# Patient Record
Sex: Male | Born: 1960 | Race: White | Hispanic: No | Marital: Married | Smoking: Current every day smoker
Health system: Southern US, Community
[De-identification: ages and names within clinical notes are randomized; demographics above are authoritative.]

## PROBLEM LIST (undated history)

## (undated) DIAGNOSIS — I219 Acute myocardial infarction, unspecified: Secondary | ICD-10-CM

## (undated) DIAGNOSIS — I714 Abdominal aortic aneurysm, without rupture, unspecified: Secondary | ICD-10-CM

## (undated) DIAGNOSIS — I10 Essential (primary) hypertension: Secondary | ICD-10-CM

## (undated) DIAGNOSIS — E7800 Pure hypercholesterolemia, unspecified: Secondary | ICD-10-CM

## (undated) DIAGNOSIS — E119 Type 2 diabetes mellitus without complications: Secondary | ICD-10-CM

## (undated) DIAGNOSIS — I2510 Atherosclerotic heart disease of native coronary artery without angina pectoris: Secondary | ICD-10-CM

## (undated) DIAGNOSIS — M797 Fibromyalgia: Secondary | ICD-10-CM

## (undated) HISTORY — DX: Abdominal aortic aneurysm, without rupture, unspecified: I71.40

## (undated) HISTORY — PX: PARTIAL COLECTOMY: SHX5273

## (undated) HISTORY — PX: KNEE SURGERY: SHX244

## (undated) HISTORY — PX: BACK SURGERY: SHX140

## (undated) HISTORY — PX: ABDOMINAL AORTIC ANEURYSM REPAIR: SUR1152

## (undated) HISTORY — PX: CARDIAC CATHETERIZATION: SHX172

---

## 2014-08-05 DIAGNOSIS — F17210 Nicotine dependence, cigarettes, uncomplicated: Secondary | ICD-10-CM | POA: Insufficient documentation

## 2014-08-05 DIAGNOSIS — N132 Hydronephrosis with renal and ureteral calculous obstruction: Secondary | ICD-10-CM | POA: Insufficient documentation

## 2015-02-23 DIAGNOSIS — M65331 Trigger finger, right middle finger: Secondary | ICD-10-CM | POA: Insufficient documentation

## 2016-04-06 DIAGNOSIS — M5416 Radiculopathy, lumbar region: Secondary | ICD-10-CM | POA: Insufficient documentation

## 2016-06-12 DIAGNOSIS — M48061 Spinal stenosis, lumbar region without neurogenic claudication: Secondary | ICD-10-CM | POA: Insufficient documentation

## 2016-06-13 DIAGNOSIS — M5126 Other intervertebral disc displacement, lumbar region: Secondary | ICD-10-CM | POA: Insufficient documentation

## 2017-01-07 DIAGNOSIS — K56600 Partial intestinal obstruction, unspecified as to cause: Secondary | ICD-10-CM | POA: Insufficient documentation

## 2017-05-08 DIAGNOSIS — G4733 Obstructive sleep apnea (adult) (pediatric): Secondary | ICD-10-CM | POA: Insufficient documentation

## 2018-11-14 DIAGNOSIS — E669 Obesity, unspecified: Secondary | ICD-10-CM | POA: Insufficient documentation

## 2018-11-14 DIAGNOSIS — K3184 Gastroparesis: Secondary | ICD-10-CM | POA: Insufficient documentation

## 2018-11-14 DIAGNOSIS — E1129 Type 2 diabetes mellitus with other diabetic kidney complication: Secondary | ICD-10-CM | POA: Insufficient documentation

## 2019-11-13 DIAGNOSIS — N179 Acute kidney failure, unspecified: Secondary | ICD-10-CM | POA: Insufficient documentation

## 2021-05-22 ENCOUNTER — Emergency Department (HOSPITAL_COMMUNITY): Payer: Medicare Other

## 2021-05-22 ENCOUNTER — Emergency Department (HOSPITAL_COMMUNITY)
Admission: EM | Admit: 2021-05-22 | Discharge: 2021-05-22 | Disposition: A | Discharge status: Home / Self Care | Payer: Medicare Other | Attending: Emergency Medicine | Admitting: Emergency Medicine

## 2021-05-22 ENCOUNTER — Other Ambulatory Visit: Payer: Self-pay

## 2021-05-22 ENCOUNTER — Telehealth: Payer: Self-pay | Admitting: Internal Medicine

## 2021-05-22 ENCOUNTER — Encounter (HOSPITAL_COMMUNITY): Payer: Self-pay | Admitting: *Deleted

## 2021-05-22 DIAGNOSIS — I2510 Atherosclerotic heart disease of native coronary artery without angina pectoris: Secondary | ICD-10-CM | POA: Insufficient documentation

## 2021-05-22 DIAGNOSIS — Z7984 Long term (current) use of oral hypoglycemic drugs: Secondary | ICD-10-CM | POA: Diagnosis not present

## 2021-05-22 DIAGNOSIS — R1013 Epigastric pain: Secondary | ICD-10-CM | POA: Diagnosis not present

## 2021-05-22 DIAGNOSIS — Z7982 Long term (current) use of aspirin: Secondary | ICD-10-CM | POA: Diagnosis not present

## 2021-05-22 DIAGNOSIS — F17210 Nicotine dependence, cigarettes, uncomplicated: Secondary | ICD-10-CM | POA: Insufficient documentation

## 2021-05-22 DIAGNOSIS — E119 Type 2 diabetes mellitus without complications: Secondary | ICD-10-CM | POA: Diagnosis not present

## 2021-05-22 HISTORY — DX: Pure hypercholesterolemia, unspecified: E78.00

## 2021-05-22 HISTORY — DX: Acute myocardial infarction, unspecified: I21.9

## 2021-05-22 HISTORY — DX: Type 2 diabetes mellitus without complications: E11.9

## 2021-05-22 HISTORY — DX: Atherosclerotic heart disease of native coronary artery without angina pectoris: I25.10

## 2021-05-22 LAB — TROPONIN I (HIGH SENSITIVITY)
Troponin I (High Sensitivity): 4 ng/L (ref <18)
Troponin I (High Sensitivity): 4 ng/L (ref <18)

## 2021-05-22 LAB — CBG MONITORING, ED
Glucose-Capillary: 58 mg/dL — ABNORMAL LOW (ref 70–99)
Glucose-Capillary: 96 mg/dL (ref 70–99)

## 2021-05-22 LAB — COMPREHENSIVE METABOLIC PANEL
ALT: 22 U/L (ref 0–44)
AST: 15 U/L (ref 15–41)
Albumin: 4.2 g/dL (ref 3.5–5.0)
Alkaline Phosphatase: 40 U/L (ref 38–126)
Anion gap: 7 (ref 5–15)
BUN: 31 mg/dL — ABNORMAL HIGH (ref 6–20)
CO2: 24 mmol/L (ref 22–32)
Calcium: 9.2 mg/dL (ref 8.9–10.3)
Chloride: 104 mmol/L (ref 98–111)
Creatinine, Ser: 1.88 mg/dL — ABNORMAL HIGH (ref 0.61–1.24)
GFR, Estimated: 41 mL/min — ABNORMAL LOW (ref >60)
Glucose, Bld: 107 mg/dL — ABNORMAL HIGH (ref 70–99)
Potassium: 4.7 mmol/L (ref 3.5–5.1)
Sodium: 135 mmol/L (ref 135–145)
Total Bilirubin: 0.6 mg/dL (ref 0.3–1.2)
Total Protein: 7.2 g/dL (ref 6.5–8.1)

## 2021-05-22 LAB — CBC WITH DIFFERENTIAL/PLATELET
Abs Immature Granulocytes: 0.03 10*3/uL (ref 0.00–0.07)
Basophils Absolute: 0.1 10*3/uL (ref 0.0–0.1)
Basophils Relative: 1 %
Eosinophils Absolute: 0.3 10*3/uL (ref 0.0–0.5)
Eosinophils Relative: 3 %
HCT: 46.4 % (ref 39.0–52.0)
Hemoglobin: 15.7 g/dL (ref 13.0–17.0)
Immature Granulocytes: 0 %
Lymphocytes Relative: 20 %
Lymphs Abs: 1.7 10*3/uL (ref 0.7–4.0)
MCH: 31.1 pg (ref 26.0–34.0)
MCHC: 33.8 g/dL (ref 30.0–36.0)
MCV: 91.9 fL (ref 80.0–100.0)
Monocytes Absolute: 0.6 10*3/uL (ref 0.1–1.0)
Monocytes Relative: 8 %
Neutro Abs: 5.5 10*3/uL (ref 1.7–7.7)
Neutrophils Relative %: 68 %
Platelets: 153 10*3/uL (ref 150–400)
RBC: 5.05 MIL/uL (ref 4.22–5.81)
RDW: 13.6 % (ref 11.5–15.5)
WBC: 8.2 10*3/uL (ref 4.0–10.5)
nRBC: 0 % (ref 0.0–0.2)

## 2021-05-22 LAB — LIPASE, BLOOD: Lipase: 30 U/L (ref 11–51)

## 2021-05-22 MED ORDER — ASPIRIN 81 MG PO CHEW
324.0000 mg | CHEWABLE_TABLET | Freq: Once | ORAL | Status: AC
  Administered 2021-05-22: 324 mg via ORAL
  Filled 2021-05-22: qty 4

## 2021-05-22 MED ORDER — PANTOPRAZOLE SODIUM 40 MG IV SOLR
40.0000 mg | Freq: Once | INTRAVENOUS | Status: AC
  Administered 2021-05-22: 40 mg via INTRAVENOUS
  Filled 2021-05-22: qty 40

## 2021-05-22 MED ORDER — SUCRALFATE 1 GM/10ML PO SUSP: 1.0000 g | Freq: Three times a day (TID) | ORAL | 0 refills | Status: DC

## 2021-05-22 MED ORDER — IOHEXOL 350 MG/ML SOLN
75.0000 mL | Freq: Once | INTRAVENOUS | Status: AC | PRN
  Administered 2021-05-22: 75 mL via INTRAVENOUS

## 2021-05-22 MED ORDER — OMEPRAZOLE 20 MG PO CPDR
20.0000 mg | DELAYED_RELEASE_CAPSULE | Freq: Two times a day (BID) | ORAL | 1 refills | Status: DC
Start: 2021-05-22 — End: 2021-05-25

## 2021-05-22 NOTE — Discharge Instructions (Addendum)
Take the Prilosec as directed.  Take the Carafate as directed.  Call Dr. Ave Filter office for follow-up and for upper endoscopy.  Information provided above.  Return for any new or worse symptoms.

## 2021-05-22 NOTE — ED Notes (Signed)
Patient reports he ambulated to the bathroom to urinate and upon return to the bed he became diaphoretic. Finger stick glucose check was 58. Orange juice po 4 ozs given. Patient recovered and reports that he took his diabetes medication today and did not eat. Wife bedside. Treatment team notified.

## 2021-05-22 NOTE — ED Notes (Signed)
Patient transported to CT and returned to room via stretcher by tech.

## 2021-05-22 NOTE — ED Triage Notes (Signed)
Pt c/o chest pain behind the lower sternum going straight to his back x 1 week. Pt also c/o diaphoresis.

## 2021-05-22 NOTE — Telephone Encounter (Signed)
I was called by the ER regards to this patient.  He has an abnormal CT scan concerning for duodenal ulcer versus duodenal diverticulum.  Can we make urgent appointment to see me in clinic either Wednesday or Thursday of this week at 8 AM?  He will need urgent EGD.  Thank you

## 2021-05-22 NOTE — ED Provider Notes (Signed)
[facility] EMERGENCY DEPARTMENT Provider Note   CSN: [id number] Arrival date & time: 05/22/21  1454     History Chief Complaint  Patient presents with   Chest Pain    Clayton Peterson is a 60 y.o. male.  Patient brought in by POV to the emergency department.  Is having epigastric abdominal pain that radiates to the back.  Is been ongoing for 1.5 weeks on and off.  At 1230 today it became constant and has remained there.  Past medical history significant for cardiac stent right side 2012.  An abdominal aortic aneurysm in 2009.  Patient recently moved to the area.  Has not gotten plugged in with cardiology locally.  Past medical history significant coronary artery disease diabetes high cholesterol.  And the abdominal aortic aneurysm repair.      Past Medical History:  Diagnosis Date   Coronary artery disease    Diabetes mellitus without complication ([place])    Heart attack ([place])    High cholesterol     There are no problems to display for this patient.   Past Surgical History:  Procedure Laterality Date   ABDOMINAL AORTIC ANEURYSM REPAIR     BACK SURGERY     x2   CARDIAC CATHETERIZATION     stent placed on right side ofo heart   KNEE SURGERY Left    PARTIAL COLECTOMY         No family history on file.  Social History   Tobacco Use   Smoking status: Every Day    Packs/day: 1.00    Types: Cigarettes    Passive exposure: Never   Smokeless tobacco: Never  Vaping Use   Vaping Use: Never used  Substance Use Topics   Alcohol use: Yes    Comment: very seldom   Drug use: Never    Home Medications Prior to Admission medications   Medication Sig Start Date End Date Taking? Authorizing Provider  acetaminophen (TYLENOL) 325 MG tablet Take 325 mg by mouth every 4 (four) hours as needed for pain. 11/16/19  Yes [provider]  aspirin 81 MG EC tablet Take 81 mg by mouth daily.   Yes [provider]  fenofibrate (TRICOR) 145 MG tablet Take 145 mg by  mouth daily. 03/18/21  Yes [provider]  gabapentin (NEURONTIN) 100 MG capsule Take 100 mg by mouth daily. 05/05/21  Yes [provider]  glimepiride (AMARYL) 4 MG tablet Take 4 mg by mouth 2 (two) times daily. 03/29/21  Yes [provider]  lisinopril (ZESTRIL) 20 MG tablet Take 20 mg by mouth daily. 03/29/21  Yes [provider]  meloxicam (MOBIC) 15 MG tablet Take 15 mg by mouth daily. 03/29/21  Yes [provider]  metFORMIN (GLUCOPHAGE) 1000 MG tablet Take 1,000 mg by mouth 2 (two) times daily. 03/18/21  Yes [provider]  metoprolol tartrate (LOPRESSOR) 50 MG tablet Take 50 mg by mouth 2 (two) times daily. 03/18/21  Yes [provider]  omeprazole (PRILOSEC) 20 MG capsule Take 1 capsule (20 mg total) by mouth 2 (two) times daily before a meal. 05/22/21  Yes Fredia Sorrow, MD  rosuvastatin (CRESTOR) 40 MG tablet Take 40 mg by mouth at bedtime. 03/29/21  Yes [provider]  sucralfate (CARAFATE) 1 GM/10ML suspension Take 10 mLs (1 g total) by mouth 4 (four) times daily -  with meals and at bedtime. 05/22/21  Yes Fredia Sorrow, MD    Allergies    Bee venom and Coconut oil  Review of Systems   Review of Systems  Constitutional:  Negative for chills and fever.  HENT:  Negative for ear pain and sore throat.   Eyes:  Negative for pain and visual disturbance.  Respiratory:  Negative for cough and shortness of breath.   Cardiovascular:  Positive for chest pain. Negative for palpitations.  Gastrointestinal:  Positive for abdominal distention. Negative for abdominal pain and vomiting.  Genitourinary:  Negative for dysuria and hematuria.  Musculoskeletal:  Positive for back pain. Negative for arthralgias.  Skin:  Negative for color change and rash.  Neurological:  Negative for seizures and syncope.  All other systems reviewed and are negative.  Physical Exam Updated Vital Signs BP 124/74 (BP Location: Left Arm)   Pulse  (!) 53   Temp 97.6 F (36.4 C) (Oral)   Resp 13   Ht 1.753 m ('5\' 9"'$ )   Wt 96.6 kg   SpO2 96%   BMI 31.45 kg/m   Physical Exam Vitals and nursing note reviewed.  Constitutional:      General: He is not in acute distress.    Appearance: Normal appearance. He is well-developed.  HENT:     Head: Normocephalic and atraumatic.  Eyes:     Extraocular Movements: Extraocular movements intact.     Conjunctiva/sclera: Conjunctivae normal.     Pupils: Pupils are equal, round, and reactive to light.  Cardiovascular:     Rate and Rhythm: Normal rate and regular rhythm.     Heart sounds: No murmur heard. Pulmonary:     Effort: Pulmonary effort is normal. No respiratory distress.     Breath sounds: Normal breath sounds.  Chest:     Chest wall: No tenderness.  Abdominal:     Palpations: Abdomen is soft.     Tenderness: There is abdominal tenderness.     Comments: Tenderness epigastric area without any guarding.  Musculoskeletal:        General: No swelling.     Cervical back: Normal range of motion and neck supple.  Skin:    General: Skin is warm and dry.     Capillary Refill: Capillary refill takes less than 2 seconds.  Neurological:     General: No focal deficit present.     Mental Status: He is alert and oriented to person, place, and time.    ED Results / Procedures / Treatments   Labs (all labs ordered are listed, but only abnormal results are displayed) Labs Reviewed  COMPREHENSIVE METABOLIC PANEL - Abnormal; Notable for the following components:      Result Value   Glucose, Bld 107 (*)    BUN 31 (*)    Creatinine, Ser 1.88 (*)    GFR, Estimated 41 (*)    All other components within normal limits  CBG MONITORING, ED - Abnormal; Notable for the following components:   Glucose-Capillary 58 (*)    All other components within normal limits  CBC WITH DIFFERENTIAL/PLATELET  LIPASE, BLOOD  CBG MONITORING, ED  TROPONIN I (HIGH SENSITIVITY)  TROPONIN I (HIGH SENSITIVITY)     EKG EKG Interpretation  Date/Time:  Sunday May 22 2021 15:07:56 EDT Ventricular Rate:  62 PR Interval:  151 QRS Duration: 99 QT Interval:  397 QTC Calculation: 404 R Axis:   93 Text Interpretation: Sinus rhythm Borderline right axis deviation Abnormal inferior Q waves Inferior changes No previous ECGs available Confirmed by Fredia Sorrow [phone]) on 05/22/2021 3:32:49 PM  Radiology DG Chest Port 1 View  Result Date: 05/22/2021 CLINICAL  DATA:  Chest and epigastric pain EXAM: PORTABLE CHEST 1 VIEW COMPARISON:  None. FINDINGS: Heart and mediastinal contours are within normal limits. No focal opacities or effusions. No acute bony abnormality. IMPRESSION: No active disease. Electronically Signed   By: Rolm Baptise M.D.   On: 05/22/2021 16:07   CT Angio Chest/Abd/Pel for Dissection W and/or W/WO  Result Date: 05/22/2021 CLINICAL DATA:  Thoracic aortic aneurysm (TAA) suspected Abdominal pain, aortic dissection suspected Chest and epigastric pain. Electronic medical records states history of aortic aneurysm repair. EXAM: CT ANGIOGRAPHY CHEST, ABDOMEN AND PELVIS TECHNIQUE: Non-contrast CT of the chest was initially obtained. Multidetector CT imaging through the chest, abdomen and pelvis was performed using the standard protocol during bolus administration of intravenous contrast. Multiplanar reconstructed images and MIPs were obtained and reviewed to evaluate the vascular anatomy. CONTRAST:  18m OMNIPAQUE IOHEXOL 350 MG/ML SOLN COMPARISON:  Chest radiograph earlier today. FINDINGS: CTA CHEST FINDINGS Cardiovascular: The thoracic aorta is normal in caliber. No aneurysm. No aortic hematoma noncontrast exam. There is moderate atherosclerosis with both calcified and irregular noncalcified plaque but no dissection, evidence of acute aortic syndrome, or evidence of vasculitis. Common origin of the brachiocephalic and left common carotid artery. Aortic branch vessels are patent. The heart is normal in  size. There are coronary artery calcifications. No pericardial effusion. Cannot assess for pulmonary embolus given phase of contrast tailored for aortic evaluation. Mediastinum/Nodes: Shotty mediastinal lymph nodes, largest subcarinal measuring 10 mm short axis. No enlarged mediastinal or hilar lymph nodes by size criteria. No axillary adenopathy. No thyroid nodule. Decompressed esophagus. Lungs/Pleura: Mild heterogeneous pulmonary parenchyma. Punctate calcified granuloma in the left upper lobe. No pneumonia or confluent airspace disease. There is moderate central bronchial thickening. No pleural fluid. No findings of pulmonary edema. No pulmonary mass. The trachea and central bronchi are patent. Musculoskeletal: There are no acute or suspicious osseous abnormalities. Review of the MIP images confirms the above findings. CTA ABDOMEN AND PELVIS FINDINGS VASCULAR Aorta: Abdominal aortic aneurysm. The aorta at the level of the left renal artery measures 3.2 cm. Aorta at the level of the left renal vein measures 3.9 cm. The infrarenal aorta measures 4.5 cm maximal dimension. Aorto bi-iliac stent graft in place, origin ating at the level of the left renal artery. There is eccentric mural thrombus in the abdominal aorta above the level of the graft, without hemodynamically significant stenosis of the aortic lumen. Mural thrombus extends to the proximal aspect of the graft causing loss and 50% luminal stenosis of the abdominal aorta. The graft is patent. No obvious endoleak on this arterial phase exam. No evidence of vasculitis or periaortic inflammation. Celiac: Patent without evidence of aneurysm, dissection, vasculitis or significant stenosis. Splenic artery is tortuous. SMA: Patent without evidence of aneurysm, dissection, vasculitis or significant stenosis. Renals: The left renal artery is patent. There are 2 right renal arteries. Accessory renal artery supplies the right upper pole which is patent. 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 there is minimal distal reconstitution with thready flow distally. IMA: Occluded at the origin with reconstitution. 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 Slight aneurysmal dilatation of the proximal left internal iliac artery at 11 mm. Right external iliac arteries patent with moderate atherosclerosis. There is irregular mixed plaque in the right common femoral artery, with distal branches patent. The left external iliac artery is patent. There is short segment occlusion of the left internal iliac artery spanning approximately 1 cm, series 4,  image 180. Distal vessel is diminutive. Veins: The portal and splenic veins are patent. Suprarenal IVC is patent. No portal venous or mesenteric gas. Review of the MIP images confirms the above findings. NON-VASCULAR Hepatobiliary: Suspected hepatic steatosis, not well assessed on this arterial phase exam. No visualized focal lesion. Gallbladder physiologically distended, no calcified stone. No biliary dilatation. Pancreas: No ductal dilatation or inflammation. Spleen: Normal in size and arterial enhancement. Adrenals/Urinary Tract: No adrenal nodule. There is diminished perfusion to the majority of the right kidney with normal perfusion only seen to the upper pole. Right renal atrophy and cortical thinning. No hydronephrosis. There is cortical scarring in the upper left kidney with at least 2 nonobstructing intrarenal calculi. No left hydronephrosis. Unremarkable urinary bladder. Stomach/Bowel: Decompressed stomach. There is an air-filled outpouching with adjacent fat stranding arising from the first portion of the duodenum, series 4, image 113. It is unclear if this represents a duodenal ulcer or duodenal diverticulum. Small bowel is otherwise unremarkable. No small bowel obstruction. The appendix is not seen. Moderate colonic stool burden without colonic wall thickening. No colonic inflammation.  Lymphatic: No abdominopelvic adenopathy. Reproductive: Prostatic calcifications. Other: No ascites or focal fluid collection. Musculoskeletal: There are no acute or suspicious osseous abnormalities. Review of the MIP images confirms the above findings. IMPRESSION: Vascular: 1. Abdominal aortic aneurysm repair with residual aneurysm sac 4.5 cm. There is mural thrombus in the aorta and both above and within the upper aspect of the graft, causing less than 50% luminal stenosis. 2. 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 Short segment occlusion of the left internal iliac artery with distal reconstitution 3. 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 There is diffuse right renal parenchymal thinning suggesting this may be chronic. Small accessory upper pole artery perfuses the far upper pole. 4. Normal caliber thoracic aorta with diffuse atherosclerosis. Nonvascular: 1. Air-filled outpouching with adjacent fat stranding arising from the first portion of the duodenum. It is unclear if this represents a duodenal ulcer or duodenal diverticulum. There is adjacent fat stranding suggesting inflammation. Endoscopy could be considered for further evaluation. 2. Nonobstructing left renal calculi. 3. Suspected hepatic steatosis. 4. Moderate central bronchial thickening. No acute intrathoracic abnormality. Aortic Atherosclerosis (ICD10-I70.0). These results were called by telephone at the time of interpretation on 05/22/2021 at 8:11 pm to provider Fredia Sorrow , who verbally acknowledged these results. Electronically Signed   By: Keith Rake M.D.   On: 05/22/2021 20:12    Procedures Procedures   Medications Ordered in ED Medications  aspirin chewable tablet 324 mg (324 mg Oral Given 05/22/21 1551)  iohexol (OMNIPAQUE) 350 MG/ML injection 75 mL (75 mLs Intravenous Contrast Given 05/22/21 1914)  pantoprazole (PROTONIX)  injection 40 mg (40 mg Intravenous Given 05/22/21 2148)    ED Course  I have reviewed the triage vital signs and the nursing notes.  Pertinent labs & imaging results that were available during my care of the patient were reviewed by me and considered in my medical decision making (see chart for details).    MDM Rules/Calculators/A&P                           Patient's pain more epigastric area raising some concern about the aortic aneurysm.  But also has a history of coronary artery disease.  Troponins x2 4 without any significant changes.  No leukocytosis.  Hemoglobin is 15.7.  Patient BUN and creatinine is elevated 31 1.88 given a GFR 41.  No significant electrolyte abnormalities no liver function test abnormalities.  Lipase was normal.  Essentially acute cardiac event ruled out EKG without any significant changes.  Chest x-ray without any acute findings.  Based on his past history of the aneurysm patient underwent CT angio chest and abdomen.  Findings showed an abdominal aortic aneurysm repair with a residual aneurysm sac of 4.5.  I this is causing less than 50% luminal stenosis.  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  The main concern as called to me by radiology was an air-filled outpouching with adjacent fat stranding arising from the first portion of the duodenum.  It is unclear if it represents a duodenal ulcer.  Or diverticulum.  This certainly duodenal ulcer could explain patient's pain pattern.  Discussed with Dr. Abbey Chatters from gastroenterology.  He recommended placing the patient on Prilosec as well as Carafate he will follow him up in the office in the next few days and arrange for an upper endoscopy.  Patient's primary care doctor is the [facility] he can follow-up regarding the renal function with them. Final Clinical Impression(s) / ED Diagnoses Final diagnoses:  Epigastric  abdominal pain    Rx / DC Orders ED Discharge Orders          Ordered    omeprazole (PRILOSEC) 20 MG capsule  2 times daily before meals        05/22/21 2148    sucralfate (CARAFATE) 1 GM/10ML suspension  3 times daily with meals & bedtime        05/22/21 2148             Fredia Sorrow, MD 05/24/21 [phone]

## 2021-05-22 NOTE — ED Notes (Addendum)
Patient complained of upper abdominal pain 7 out of 10. Patient states that he is sweating and feels hot. Took patient's temp 98.1. Asked patient if he was diabetic, patient states yes. CBG checked 58. RN Marliss Coots made aware.

## 2021-05-23 NOTE — Telephone Encounter (Signed)
Patient's wife is aware of OV with Dr Abbey Chatters on 8/3 at 0800 with Dr Abbey Chatters

## 2021-05-25 ENCOUNTER — Encounter: Payer: Self-pay | Admitting: Internal Medicine

## 2021-05-25 ENCOUNTER — Telehealth: Payer: Self-pay | Admitting: *Deleted

## 2021-05-25 ENCOUNTER — Ambulatory Visit (INDEPENDENT_AMBULATORY_CARE_PROVIDER_SITE_OTHER): Payer: Medicare Other | Admitting: Internal Medicine

## 2021-05-25 ENCOUNTER — Other Ambulatory Visit: Payer: Self-pay

## 2021-05-25 ENCOUNTER — Other Ambulatory Visit: Payer: Self-pay | Admitting: Gastroenterology

## 2021-05-25 VITALS — BP 100/61 | HR 47 | Temp 97.0°F | Ht 69.0 in | Wt 219.4 lb

## 2021-05-25 DIAGNOSIS — R933 Abnormal findings on diagnostic imaging of other parts of digestive tract: Secondary | ICD-10-CM

## 2021-05-25 DIAGNOSIS — R1013 Epigastric pain: Secondary | ICD-10-CM | POA: Diagnosis not present

## 2021-05-25 DIAGNOSIS — Z1211 Encounter for screening for malignant neoplasm of colon: Secondary | ICD-10-CM

## 2021-05-25 MED ORDER — OMEPRAZOLE 20 MG PO CPDR: 20.0000 mg | DELAYED_RELEASE_CAPSULE | Freq: Two times a day (BID) | ORAL | 5 refills | Status: DC

## 2021-05-25 NOTE — Progress Notes (Signed)
Primary Care Physician:  Alanson Puls, [facility] Primary Gastroenterologist:  Dr. Abbey Chatters  Chief Complaint  Patient presents with   Abdominal Pain    Abd pain has improved some. Recently in ED Sunday.     HPI:   Clayton Peterson is a 60 y.o. male who presents to the clinic today for ER follow-up visit.  Seen in the ER 3 days ago for new onset epigastric pain.  Patient states this started approximately 2 weeks ago but progressively worsened.  Pain moderate to severe, does not radiate.  Does have a history of aortic repair.  Underwent CT chest abdomen pelvis in the ER which showed findings of possible duodenal ulcer versus diverticulum.  Patient does take Mobic every day for the last 5 years.  Also takes ASA 81 mg twice daily, recently decreased to once daily.  No melena hematochezia.  No change in his bowel movements.  I was contacted by the ER and recommended patient being started on omeprazole 20 mg twice daily as well as Carafate 4 times a day.  He states since starting this his symptoms have improved.  Never had an EGD or colonoscopy.  Has never undergone any colon cancer screening modality.  No family history of colorectal malignancy.  No unintentional weight loss.  Past Medical History:  Diagnosis Date   Coronary artery disease    Diabetes mellitus without complication ([place])    Heart attack ([place])    High cholesterol     Past Surgical History:  Procedure Laterality Date   ABDOMINAL AORTIC ANEURYSM REPAIR     BACK SURGERY     x2   CARDIAC CATHETERIZATION     stent placed on right side ofo heart   KNEE SURGERY Left    PARTIAL COLECTOMY      Current Outpatient Medications  Medication Sig Dispense Refill   acetaminophen (TYLENOL) 325 MG tablet Take 325 mg by mouth every 4 (four) hours as needed for pain.     aspirin 81 MG EC tablet Take 81 mg by mouth daily.     Cholecalciferol (VITAMIN D3) 125 MCG (5000 UT) TABS Take by mouth daily.     fenofibrate (TRICOR) 145 MG tablet  Take 145 mg by mouth daily.     gabapentin (NEURONTIN) 100 MG capsule Take 100 mg by mouth daily.     glimepiride (AMARYL) 4 MG tablet Take 4 mg by mouth 2 (two) times daily.     lisinopril (ZESTRIL) 20 MG tablet Take 20 mg by mouth daily.     meloxicam (MOBIC) 15 MG tablet Take 15 mg by mouth daily.     metFORMIN (GLUCOPHAGE) 1000 MG tablet Take 1,000 mg by mouth 2 (two) times daily.     metoprolol tartrate (LOPRESSOR) 50 MG tablet Take 50 mg by mouth 2 (two) times daily.     omeprazole (PRILOSEC) 20 MG capsule Take 1 capsule (20 mg total) by mouth 2 (two) times daily before a meal. 30 capsule 1   rosuvastatin (CRESTOR) 40 MG tablet Take 40 mg by mouth at bedtime.     sucralfate (CARAFATE) 1 GM/10ML suspension Take 10 mLs (1 g total) by mouth 4 (four) times daily -  with meals and at bedtime. 420 mL 0   traMADol (ULTRAM) 50 MG tablet Take 50 mg by mouth every 6 (six) hours as needed.     No current facility-administered medications for this visit.    Allergies as of 05/25/2021 - Review Complete 05/25/2021  Allergen Reaction  Noted   Bee venom Anaphylaxis 05/22/2021   Coconut oil Anaphylaxis 05/22/2021    No family history on file.  Social History   Socioeconomic History   Marital status: Married    Spouse name: Not on file   Number of children: Not on file   Years of education: Not on file   Highest education level: Not on file  Occupational History   Not on file  Tobacco Use   Smoking status: Every Day    Packs/day: 1.00    Types: Cigarettes    Passive exposure: Never   Smokeless tobacco: Never  Vaping Use   Vaping Use: Never used  Substance and Sexual Activity   Alcohol use: Yes    Comment: very seldom   Drug use: Never   Sexual activity: Not on file  Other Topics Concern   Not on file  Social History Narrative   Not on file   Social Determinants of Health   Financial Resource Strain: Not on file  Food Insecurity: Not on file  Transportation Needs: Not on file   Physical Activity: Not on file  Stress: Not on file  Social Connections: Not on file  Intimate Partner Violence: Not on file    Subjective: Review of Systems  Constitutional:  Negative for chills and fever.  HENT:  Negative for congestion and hearing loss.   Eyes:  Negative for blurred vision and double vision.  Respiratory:  Negative for cough and shortness of breath.   Cardiovascular:  Negative for chest pain and palpitations.  Gastrointestinal:  Positive for abdominal pain and nausea. Negative for blood in stool, constipation, diarrhea, heartburn, melena and vomiting.  Genitourinary:  Negative for dysuria and urgency.  Musculoskeletal:  Negative for joint pain and myalgias.  Skin:  Negative for itching and rash.  Neurological:  Negative for dizziness and headaches.  Psychiatric/Behavioral:  Negative for depression. The patient is not nervous/anxious.       Objective: BP 100/61   Pulse (!) 47   Temp (!) 97 F (36.1 C)   Ht '5\' 9"'$  (1.753 m)   Wt 219 lb 6.4 oz (99.5 kg)   BMI 32.40 kg/m  Physical Exam Constitutional:      Appearance: Normal appearance.  HENT:     Head: Normocephalic and atraumatic.  Eyes:     Extraocular Movements: Extraocular movements intact.     Conjunctiva/sclera: Conjunctivae normal.  Cardiovascular:     Rate and Rhythm: Normal rate and regular rhythm.  Pulmonary:     Effort: Pulmonary effort is normal.     Breath sounds: Normal breath sounds.  Abdominal:     General: Bowel sounds are normal.     Palpations: Abdomen is soft.  Musculoskeletal:        General: Normal range of motion.     Cervical back: Normal range of motion and neck supple.  Skin:    General: Skin is warm.  Neurological:     General: No focal deficit present.     Mental Status: He is alert and oriented to person, place, and time.  Psychiatric:        Mood and Affect: Mood normal.        Behavior: Behavior normal.     Assessment: *Epigastric pain *Abnormal CT of the  small bowel *Colon cancer screening  Plan: Discussed patient's CT in depth with him and his wife today. Will schedule for EGD to evaluate for peptic ulcer disease, esophagitis, gastritis, H. Pylori, duodenitis, or other. Will also  evaluate for esophageal stricture, Schatzki's ring, esophageal web or other.   At the same time we will perform colonoscopy for colon cancer screening purposes.  The risks including infection, bleed, or perforation as well as benefits, limitations, alternatives and imponderables have been reviewed with the patient. Potential for esophageal dilation, biopsy, etc. have also been reviewed.  Questions have been answered. All parties agreeable.  Continue on omeprazole 20 mg twice daily.  Refill sent today.  Continue Carafate 4 times a day as needed.  Further recommendations to follow.   05/25/2021 8:29 AM   Disclaimer: This note was dictated with voice recognition software. Similar sounding words can inadvertently be transcribed and may not be corrected upon review.

## 2021-05-25 NOTE — H&P (View-Only) (Signed)
Primary Care Physician:  Alanson Puls, [facility] Primary Gastroenterologist:  Dr. Abbey Chatters  Chief Complaint  Patient presents with   Abdominal Pain    Abd pain has improved some. Recently in ED Sunday.     HPI:   Clayton Peterson is a 60 y.o. male who presents to the clinic today for ER follow-up visit.  Seen in the ER 3 days ago for new onset epigastric pain.  Patient states this started approximately 2 weeks ago but progressively worsened.  Pain moderate to severe, does not radiate.  Does have a history of aortic repair.  Underwent CT chest abdomen pelvis in the ER which showed findings of possible duodenal ulcer versus diverticulum.  Patient does take Mobic every day for the last 5 years.  Also takes ASA 81 mg twice daily, recently decreased to once daily.  No melena hematochezia.  No change in his bowel movements.  I was contacted by the ER and recommended patient being started on omeprazole 20 mg twice daily as well as Carafate 4 times a day.  He states since starting this his symptoms have improved.  Never had an EGD or colonoscopy.  Has never undergone any colon cancer screening modality.  No family history of colorectal malignancy.  No unintentional weight loss.  Past Medical History:  Diagnosis Date   Coronary artery disease    Diabetes mellitus without complication ([place])    Heart attack ([place])    High cholesterol     Past Surgical History:  Procedure Laterality Date   ABDOMINAL AORTIC ANEURYSM REPAIR     BACK SURGERY     x2   CARDIAC CATHETERIZATION     stent placed on right side ofo heart   KNEE SURGERY Left    PARTIAL COLECTOMY      Current Outpatient Medications  Medication Sig Dispense Refill   acetaminophen (TYLENOL) 325 MG tablet Take 325 mg by mouth every 4 (four) hours as needed for pain.     aspirin 81 MG EC tablet Take 81 mg by mouth daily.     Cholecalciferol (VITAMIN D3) 125 MCG (5000 UT) TABS Take by mouth daily.     fenofibrate (TRICOR) 145 MG tablet  Take 145 mg by mouth daily.     gabapentin (NEURONTIN) 100 MG capsule Take 100 mg by mouth daily.     glimepiride (AMARYL) 4 MG tablet Take 4 mg by mouth 2 (two) times daily.     lisinopril (ZESTRIL) 20 MG tablet Take 20 mg by mouth daily.     meloxicam (MOBIC) 15 MG tablet Take 15 mg by mouth daily.     metFORMIN (GLUCOPHAGE) 1000 MG tablet Take 1,000 mg by mouth 2 (two) times daily.     metoprolol tartrate (LOPRESSOR) 50 MG tablet Take 50 mg by mouth 2 (two) times daily.     omeprazole (PRILOSEC) 20 MG capsule Take 1 capsule (20 mg total) by mouth 2 (two) times daily before a meal. 30 capsule 1   rosuvastatin (CRESTOR) 40 MG tablet Take 40 mg by mouth at bedtime.     sucralfate (CARAFATE) 1 GM/10ML suspension Take 10 mLs (1 g total) by mouth 4 (four) times daily -  with meals and at bedtime. 420 mL 0   traMADol (ULTRAM) 50 MG tablet Take 50 mg by mouth every 6 (six) hours as needed.     No current facility-administered medications for this visit.    Allergies as of 05/25/2021 - Review Complete 05/25/2021  Allergen Reaction  Noted   Bee venom Anaphylaxis 05/22/2021   Coconut oil Anaphylaxis 05/22/2021    No family history on file.  Social History   Socioeconomic History   Marital status: Married    Spouse name: Not on file   Number of children: Not on file   Years of education: Not on file   Highest education level: Not on file  Occupational History   Not on file  Tobacco Use   Smoking status: Every Day    Packs/day: 1.00    Types: Cigarettes    Passive exposure: Never   Smokeless tobacco: Never  Vaping Use   Vaping Use: Never used  Substance and Sexual Activity   Alcohol use: Yes    Comment: very seldom   Drug use: Never   Sexual activity: Not on file  Other Topics Concern   Not on file  Social History Narrative   Not on file   Social Determinants of Health   Financial Resource Strain: Not on file  Food Insecurity: Not on file  Transportation Needs: Not on file   Physical Activity: Not on file  Stress: Not on file  Social Connections: Not on file  Intimate Partner Violence: Not on file    Subjective: Review of Systems  Constitutional:  Negative for chills and fever.  HENT:  Negative for congestion and hearing loss.   Eyes:  Negative for blurred vision and double vision.  Respiratory:  Negative for cough and shortness of breath.   Cardiovascular:  Negative for chest pain and palpitations.  Gastrointestinal:  Positive for abdominal pain and nausea. Negative for blood in stool, constipation, diarrhea, heartburn, melena and vomiting.  Genitourinary:  Negative for dysuria and urgency.  Musculoskeletal:  Negative for joint pain and myalgias.  Skin:  Negative for itching and rash.  Neurological:  Negative for dizziness and headaches.  Psychiatric/Behavioral:  Negative for depression. The patient is not nervous/anxious.       Objective: BP 100/61   Pulse (!) 47   Temp (!) 97 F (36.1 C)   Ht '5\' 9"'$  (1.753 m)   Wt 219 lb 6.4 oz (99.5 kg)   BMI 32.40 kg/m  Physical Exam Constitutional:      Appearance: Normal appearance.  HENT:     Head: Normocephalic and atraumatic.  Eyes:     Extraocular Movements: Extraocular movements intact.     Conjunctiva/sclera: Conjunctivae normal.  Cardiovascular:     Rate and Rhythm: Normal rate and regular rhythm.  Pulmonary:     Effort: Pulmonary effort is normal.     Breath sounds: Normal breath sounds.  Abdominal:     General: Bowel sounds are normal.     Palpations: Abdomen is soft.  Musculoskeletal:        General: Normal range of motion.     Cervical back: Normal range of motion and neck supple.  Skin:    General: Skin is warm.  Neurological:     General: No focal deficit present.     Mental Status: He is alert and oriented to person, place, and time.  Psychiatric:        Mood and Affect: Mood normal.        Behavior: Behavior normal.     Assessment: *Epigastric pain *Abnormal CT of the  small bowel *Colon cancer screening  Plan: Discussed patient's CT in depth with him and his wife today. Will schedule for EGD to evaluate for peptic ulcer disease, esophagitis, gastritis, H. Pylori, duodenitis, or other. Will also  evaluate for esophageal stricture, Schatzki's ring, esophageal web or other.   At the same time we will perform colonoscopy for colon cancer screening purposes.  The risks including infection, bleed, or perforation as well as benefits, limitations, alternatives and imponderables have been reviewed with the patient. Potential for esophageal dilation, biopsy, etc. have also been reviewed.  Questions have been answered. All parties agreeable.  Continue on omeprazole 20 mg twice daily.  Refill sent today.  Continue Carafate 4 times a day as needed.  Further recommendations to follow.   05/25/2021 8:29 AM   Disclaimer: This note was dictated with voice recognition software. Similar sounding words can inadvertently be transcribed and may not be corrected upon review.

## 2021-05-25 NOTE — Patient Instructions (Signed)
We will schedule you for upper endoscopy to further evaluate your epigastric pain and abnormal CT scan.  At the same time we will perform colonoscopy for screening purposes.  Continue on omeprazole twice daily.  Continue Carafate 4 times daily until bottle is empty.  Further recommendations to follow.  It was nice meeting both of you today   Dr. Loletha Grayer  At [facility] we value your feedback. You may receive a survey about your visit today. Please share your experience as we strive to create trusting relationships with our patients to provide genuine, compassionate, quality care.  We appreciate your understanding and patience as we review any laboratory studies, imaging, and other diagnostic tests that are ordered as we care for you. Our office policy is 5 business days for review of these results, and any emergent or urgent results are addressed in a timely manner for your best interest. If you do not hear from our office in 1 week, please contact us.   We also encourage the use of MyChart, which contains your medical information for your review as well. If you are not enrolled in this feature, an access code is on this after visit summary for your convenience. Thank you for allowing [place] to be involved in your care.  It was great to see you today!  I hope you have a great rest of your summer!!    Elon Alas. Abbey Chatters, D.O. Gastroenterology and Hepatology [facility]

## 2021-05-25 NOTE — Telephone Encounter (Signed)
Called pt home # x 3 line busy, called mobile [facility] to call back to schedule TCS/EGD propofol ASA 3 with Dr. Abbey Chatters

## 2021-05-26 ENCOUNTER — Encounter: Payer: Self-pay | Admitting: *Deleted

## 2021-05-26 MED ORDER — PEG 3350-KCL-NA BICARB-NACL 420 G PO SOLR: ORAL | 0 refills | Status: DC

## 2021-05-26 NOTE — Telephone Encounter (Signed)
Spoke with pt spouse. He has been scheduled for 8/12 at 10:00am. Aware will need pre-op appt. They will come by office and pick up instructions with pre-op appt. Confirmed pharmacy

## 2021-05-30 ENCOUNTER — Encounter (HOSPITAL_COMMUNITY): Payer: Self-pay

## 2021-05-31 ENCOUNTER — Other Ambulatory Visit: Payer: Self-pay

## 2021-05-31 ENCOUNTER — Encounter (HOSPITAL_COMMUNITY): Payer: Self-pay

## 2021-05-31 ENCOUNTER — Encounter (HOSPITAL_COMMUNITY)
Admission: RE | Admit: 2021-05-31 | Discharge: 2021-05-31 | Disposition: A | Discharge status: Home / Self Care | Payer: Medicare Other | Source: Ambulatory Visit | Attending: Internal Medicine | Admitting: Internal Medicine

## 2021-05-31 HISTORY — DX: Essential (primary) hypertension: I10

## 2021-05-31 NOTE — Patient Instructions (Addendum)
Clayton Peterson  05/31/2021     '@PREFPERIOPPHARMACY'$ @   Your procedure is scheduled on  06/03/2021.   Report to [name] at  0800  A.M.   Call this number if you have problems the morning of surgery:  [phone]   Remember:  Follow the diet and prep instructions given to you  by the office.    DO NOT take any medications for diabetes the morning of your procedure.    Take these medicines the morning of surgery with A SIP OF WATER      metoprolol, omeprazole, tramadol (if needed).     Do not wear jewelry, make-up or nail polish.  Do not wear lotions, powders, or perfumes, or deodorant.  Do not shave 48 hours prior to surgery.  Men may shave face and neck.  Do not bring valuables to the hospital.  [facility] is not responsible for any belongings or valuables.  Contacts, dentures or bridgework may not be worn into surgery.  Leave your suitcase in the car.  After surgery it may be brought to your room.  For patients admitted to the hospital, discharge time will be determined by your treatment team.  Patients discharged the day of surgery will not be allowed to drive home and must have someone with them for 24 hours.    Special instructions:   DO NOT smoke tobacco or vape for 24 hours before your procedure.  Please read over the following fact sheets that you were given. Anesthesia Post-op Instructions and Care and Recovery After Surgery      Upper Endoscopy, Adult, Care After This sheet gives you information about how to care for yourself after your procedure. Your health care provider may also give you more specific instructions. If you have problems or questions, contact your health careprovider. What can I expect after the procedure? After the procedure, it is common to have: A sore throat. Mild stomach pain or discomfort. Bloating. Nausea. Follow these instructions at home:  Follow instructions from your health care provider about what to eat or  drink after your procedure. Return to your normal activities as told by your health care provider. Ask your health care provider what activities are safe for you. Take over-the-counter and prescription medicines only as told by your health care provider. If you were given a sedative during the procedure, it can affect you for several hours. Do not drive or operate machinery until your health care provider says that it is safe. Keep all follow-up visits as told by your health care provider. This is important. Contact a health care provider if you have: A sore throat that lasts longer than one day. Trouble swallowing. Get help right away if: You vomit blood or your vomit looks like coffee grounds. You have: A fever. Bloody, black, or tarry stools. A severe sore throat or you cannot swallow. Difficulty breathing. Severe pain in your chest or abdomen. Summary After the procedure, it is common to have a sore throat, mild stomach discomfort, bloating, and nausea. If you were given a sedative during the procedure, it can affect you for several hours. Do not drive or operate machinery until your health care provider says that it is safe. Follow instructions from your health care provider about what to eat or drink after your procedure. Return to your normal activities as told by your health care provider. This information is not intended to replace advice given to you by your health  care provider. Make sure you discuss any questions you have with your healthcare provider. Document Revised: 10/07/2019 Document Reviewed: 03/11/2018 Elsevier Patient Education  2022 [place]. Colonoscopy, Adult, Care After This sheet gives you information about how to care for yourself after your procedure. Your health care provider may also give you more specific instructions. If you have problems or questions, contact your health careprovider. What can I expect after the procedure? After the procedure, it is  common to have: A small amount of blood in your stool for 24 hours after the procedure. Some gas. Mild cramping or bloating of your abdomen. Follow these instructions at home: Eating and drinking  Drink enough fluid to keep your urine pale yellow. Follow instructions from your health care provider about eating or drinking restrictions. Resume your normal diet as instructed by your health care provider. Avoid heavy or fried foods that are hard to digest.  Activity Rest as told by your health care provider. Avoid sitting for a long time without moving. Get up to take short walks every 1-2 hours. This is important to improve blood flow and breathing. Ask for help if you feel weak or unsteady. Return to your normal activities as told by your health care provider. Ask your health care provider what activities are safe for you. Managing cramping and bloating  Try walking around when you have cramps or feel bloated. Apply heat to your abdomen as told by your health care provider. Use the heat source that your health care provider recommends, such as a moist heat pack or a heating pad. Place a towel between your skin and the heat source. Leave the heat on for 20-30 minutes. Remove the heat if your skin turns bright red. This is especially important if you are unable to feel pain, heat, or cold. You may have a greater risk of getting burned.  General instructions If you were given a sedative during the procedure, it can affect you for several hours. Do not drive or operate machinery until your health care provider says that it is safe. For the first 24 hours after the procedure: Do not sign important documents. Do not drink alcohol. Do your regular daily activities at a slower pace than normal. Eat soft foods that are easy to digest. Take over-the-counter and prescription medicines only as told by your health care provider. Keep all follow-up visits as told by your health care provider. This is  important. Contact a health care provider if: You have blood in your stool 2-3 days after the procedure. Get help right away if you have: More than a small spotting of blood in your stool. Large blood clots in your stool. Swelling of your abdomen. Nausea or vomiting. A fever. Increasing pain in your abdomen that is not relieved with medicine. Summary After the procedure, it is common to have a small amount of blood in your stool. You may also have mild cramping and bloating of your abdomen. If you were given a sedative during the procedure, it can affect you for several hours. Do not drive or operate machinery until your health care provider says that it is safe. Get help right away if you have a lot of blood in your stool, nausea or vomiting, a fever, or increased pain in your abdomen. This information is not intended to replace advice given to you by your health care provider. Make sure you discuss any questions you have with your healthcare provider. Document Revised: 10/03/2019 Document Reviewed: 05/05/2019 Elsevier Patient  Education  2022 [place] After This sheet gives you information about how to care for yourself after your procedure. Your health care provider may also give you more specific instructions. If you have problems or questions, contact your health careprovider. What can I expect after the procedure? After the procedure, it is common to have: Tiredness. Forgetfulness about what happened after the procedure. Impaired judgment for important decisions. Nausea or vomiting. Some difficulty with balance. Follow these instructions at home: For the time period you were told by your health care provider:     Rest as needed. Do not participate in activities where you could fall or become injured. Do not drive or use machinery. Do not drink alcohol. Do not take sleeping pills or medicines that cause drowsiness. Do not make important  decisions or sign legal documents. Do not take care of children on your own. Eating and drinking Follow the diet that is recommended by your health care provider. Drink enough fluid to keep your urine pale yellow. If you vomit: Drink water, juice, or soup when you can drink without vomiting. Make sure you have little or no nausea before eating solid foods. General instructions Have a responsible adult stay with you for the time you are told. It is important to have someone help care for you until you are awake and alert. Take over-the-counter and prescription medicines only as told by your health care provider. If you have sleep apnea, surgery and certain medicines can increase your risk for breathing problems. Follow instructions from your health care provider about wearing your sleep device: Anytime you are sleeping, including during daytime naps. While taking prescription pain medicines, sleeping medicines, or medicines that make you drowsy. Avoid smoking. Keep all follow-up visits as told by your health care provider. This is important. Contact a health care provider if: You keep feeling nauseous or you keep vomiting. You feel light-headed. You are still sleepy or having trouble with balance after 24 hours. You develop a rash. You have a fever. You have redness or swelling around the IV site. Get help right away if: You have trouble breathing. You have new-onset confusion at home. Summary For several hours after your procedure, you may feel tired. You may also be forgetful and have poor judgment. Have a responsible adult stay with you for the time you are told. It is important to have someone help care for you until you are awake and alert. Rest as told. Do not drive or operate machinery. Do not drink alcohol or take sleeping pills. Get help right away if you have trouble breathing, or if you suddenly become confused. This information is not intended to replace advice given to you  by your health care provider. Make sure you discuss any questions you have with your healthcare provider. Document Revised: 06/24/2020 Document Reviewed: 09/11/2019 Elsevier Patient Education  2022 [employer].

## 2021-06-03 ENCOUNTER — Ambulatory Visit (HOSPITAL_COMMUNITY)
Admission: RE | Admit: 2021-06-03 | Discharge: 2021-06-03 | Disposition: A | Discharge status: Home / Self Care | Payer: Medicare Other | Attending: Internal Medicine | Admitting: Internal Medicine

## 2021-06-03 ENCOUNTER — Encounter (HOSPITAL_COMMUNITY)
Admission: RE | Disposition: A | Discharge status: Home / Self Care | Payer: Self-pay | Source: Home / Self Care | Attending: Internal Medicine

## 2021-06-03 ENCOUNTER — Ambulatory Visit (HOSPITAL_COMMUNITY): Payer: Medicare Other | Admitting: Anesthesiology

## 2021-06-03 ENCOUNTER — Encounter (HOSPITAL_COMMUNITY): Payer: Self-pay

## 2021-06-03 ENCOUNTER — Other Ambulatory Visit: Payer: Self-pay

## 2021-06-03 DIAGNOSIS — D122 Benign neoplasm of ascending colon: Secondary | ICD-10-CM

## 2021-06-03 DIAGNOSIS — Z79899 Other long term (current) drug therapy: Secondary | ICD-10-CM | POA: Diagnosis not present

## 2021-06-03 DIAGNOSIS — F17210 Nicotine dependence, cigarettes, uncomplicated: Secondary | ICD-10-CM | POA: Diagnosis not present

## 2021-06-03 DIAGNOSIS — T39395A Adverse effect of other nonsteroidal anti-inflammatory drugs [NSAID], initial encounter: Secondary | ICD-10-CM | POA: Insufficient documentation

## 2021-06-03 DIAGNOSIS — R933 Abnormal findings on diagnostic imaging of other parts of digestive tract: Secondary | ICD-10-CM | POA: Insufficient documentation

## 2021-06-03 DIAGNOSIS — K2950 Unspecified chronic gastritis without bleeding: Secondary | ICD-10-CM | POA: Insufficient documentation

## 2021-06-03 DIAGNOSIS — Z7982 Long term (current) use of aspirin: Secondary | ICD-10-CM | POA: Diagnosis not present

## 2021-06-03 DIAGNOSIS — Z7984 Long term (current) use of oral hypoglycemic drugs: Secondary | ICD-10-CM | POA: Insufficient documentation

## 2021-06-03 DIAGNOSIS — D123 Benign neoplasm of transverse colon: Secondary | ICD-10-CM | POA: Diagnosis not present

## 2021-06-03 DIAGNOSIS — K6389 Other specified diseases of intestine: Secondary | ICD-10-CM | POA: Diagnosis not present

## 2021-06-03 DIAGNOSIS — K648 Other hemorrhoids: Secondary | ICD-10-CM | POA: Insufficient documentation

## 2021-06-03 DIAGNOSIS — Z9049 Acquired absence of other specified parts of digestive tract: Secondary | ICD-10-CM | POA: Diagnosis not present

## 2021-06-03 DIAGNOSIS — R1013 Epigastric pain: Secondary | ICD-10-CM | POA: Insufficient documentation

## 2021-06-03 DIAGNOSIS — K269 Duodenal ulcer, unspecified as acute or chronic, without hemorrhage or perforation: Secondary | ICD-10-CM | POA: Diagnosis not present

## 2021-06-03 DIAGNOSIS — Z791 Long term (current) use of non-steroidal anti-inflammatories (NSAID): Secondary | ICD-10-CM | POA: Diagnosis not present

## 2021-06-03 DIAGNOSIS — D125 Benign neoplasm of sigmoid colon: Secondary | ICD-10-CM

## 2021-06-03 DIAGNOSIS — Z1211 Encounter for screening for malignant neoplasm of colon: Secondary | ICD-10-CM | POA: Diagnosis not present

## 2021-06-03 DIAGNOSIS — K2970 Gastritis, unspecified, without bleeding: Secondary | ICD-10-CM | POA: Diagnosis not present

## 2021-06-03 HISTORY — PX: COLONOSCOPY WITH PROPOFOL: SHX5780

## 2021-06-03 HISTORY — PX: POLYPECTOMY: SHX149

## 2021-06-03 HISTORY — PX: BIOPSY: SHX5522

## 2021-06-03 HISTORY — PX: ESOPHAGOGASTRODUODENOSCOPY (EGD) WITH PROPOFOL: SHX5813

## 2021-06-03 LAB — GLUCOSE, CAPILLARY: Glucose-Capillary: 129 mg/dL — ABNORMAL HIGH (ref 70–99)

## 2021-06-03 SURGERY — COLONOSCOPY WITH PROPOFOL
Anesthesia: General

## 2021-06-03 MED ORDER — EPHEDRINE 5 MG/ML INJ
INTRAVENOUS | Status: AC
  Filled 2021-06-03: qty 5

## 2021-06-03 MED ORDER — PROPOFOL 500 MG/50ML IV EMUL
INTRAVENOUS | Status: DC | PRN
  Administered 2021-06-03: 125 ug/kg/min via INTRAVENOUS

## 2021-06-03 MED ORDER — PHENYLEPHRINE HCL (PRESSORS) 10 MG/ML IV SOLN
INTRAVENOUS | Status: DC | PRN
  Administered 2021-06-03: 120 ug via INTRAVENOUS

## 2021-06-03 MED ORDER — EPHEDRINE SULFATE 50 MG/ML IJ SOLN
INTRAMUSCULAR | Status: DC | PRN
  Administered 2021-06-03: 10 mg via INTRAVENOUS

## 2021-06-03 MED ORDER — LACTATED RINGERS IV SOLN: INTRAVENOUS | Status: DC

## 2021-06-03 MED ORDER — LIDOCAINE HCL (CARDIAC) PF 100 MG/5ML IV SOSY
PREFILLED_SYRINGE | INTRAVENOUS | Status: DC | PRN
  Administered 2021-06-03: 50 mg via INTRAVENOUS

## 2021-06-03 MED ORDER — PROPOFOL 10 MG/ML IV BOLUS
INTRAVENOUS | Status: DC | PRN
  Administered 2021-06-03: 100 mg via INTRAVENOUS
  Administered 2021-06-03: 50 mg via INTRAVENOUS

## 2021-06-03 MED ORDER — STERILE WATER FOR IRRIGATION IR SOLN
Status: DC | PRN
  Administered 2021-06-03: 200 mL

## 2021-06-03 MED ORDER — PHENYLEPHRINE 40 MCG/ML (10ML) SYRINGE FOR IV PUSH (FOR BLOOD PRESSURE SUPPORT)
PREFILLED_SYRINGE | INTRAVENOUS | Status: AC
  Filled 2021-06-03: qty 10

## 2021-06-03 NOTE — Interval H&P Note (Signed)
History and Physical Interval Note:  06/03/2021 9:39 AM  [employer]  has presented today for surgery, with the diagnosis of colon cancer screening, epigastric pain, abn CT scan small bowel.  The various methods of treatment have been discussed with the patient and family. After consideration of risks, benefits and other options for treatment, the patient has consented to  Procedure(s) with comments: COLONOSCOPY WITH PROPOFOL (N/A) - 10:00am ESOPHAGOGASTRODUODENOSCOPY (EGD) WITH PROPOFOL (N/A) as a surgical intervention.  The patient's history has been reviewed, patient examined, no change in status, stable for surgery.  I have reviewed the patient's chart and labs.  Questions were answered to the patient's satisfaction.     Eloise Harman

## 2021-06-03 NOTE — Discharge Instructions (Addendum)
EGD Discharge instructions Please read the instructions outlined below and refer to this sheet in the next few weeks. These discharge instructions provide you with general information on caring for yourself after you leave the hospital. Your doctor may also give you specific instructions. While your treatment has been planned according to the most current medical practices available, unavoidable complications occasionally occur. If you have any problems or questions after discharge, please call your doctor. ACTIVITY You may resume your regular activity but move at a slower pace for the next 24 hours.  Take frequent rest periods for the next 24 hours.  Walking will help expel (get rid of) the air and reduce the bloated feeling in your abdomen.  No driving for 24 hours (because of the anesthesia (medicine) used during the test).  You may shower.  Do not sign any important legal documents or operate any machinery for 24 hours (because of the anesthesia used during the test).  NUTRITION Drink plenty of fluids.  You may resume your normal diet.  Begin with a light meal and progress to your normal diet.  Avoid alcoholic beverages for 24 hours or as instructed by your caregiver.  MEDICATIONS You may resume your normal medications unless your caregiver tells you otherwise.  WHAT YOU CAN EXPECT TODAY You may experience abdominal discomfort such as a feeling of fullness or "gas" pains.  FOLLOW-UP Your doctor will discuss the results of your test with you.  SEEK IMMEDIATE MEDICAL ATTENTION IF ANY OF THE FOLLOWING OCCUR: Excessive nausea (feeling sick to your stomach) and/or vomiting.  Severe abdominal pain and distention (swelling).  Trouble swallowing.  Temperature over 101 F (37.8 C).  Rectal bleeding or vomiting of blood.     Colonoscopy Discharge Instructions  Read the instructions outlined below and refer to this sheet in the next few weeks. These discharge instructions provide you with  general information on caring for yourself after you leave the hospital. Your doctor may also give you specific instructions. While your treatment has been planned according to the most current medical practices available, unavoidable complications occasionally occur.   ACTIVITY You may resume your regular activity, but move at a slower pace for the next 24 hours.  Take frequent rest periods for the next 24 hours.  Walking will help get rid of the air and reduce the bloated feeling in your belly (abdomen).  No driving for 24 hours (because of the medicine (anesthesia) used during the test).   Do not sign any important legal documents or operate any machinery for 24 hours (because of the anesthesia used during the test).  NUTRITION Drink plenty of fluids.  You may resume your normal diet as instructed by your doctor.  Begin with a light meal and progress to your normal diet. Heavy or fried foods are harder to digest and may make you feel sick to your stomach (nauseated).  Avoid alcoholic beverages for 24 hours or as instructed.  MEDICATIONS You may resume your normal medications unless your doctor tells you otherwise.  WHAT YOU CAN EXPECT TODAY Some feelings of bloating in the abdomen.  Passage of more gas than usual.  Spotting of blood in your stool or on the toilet paper.  IF YOU HAD POLYPS REMOVED DURING THE COLONOSCOPY: No aspirin products for 7 days or as instructed.  No alcohol for 7 days or as instructed.  Eat a soft diet for the next 24 hours.  FINDING OUT THE RESULTS OF YOUR TEST Not all test results are  available during your visit. If your test results are not back during the visit, make an appointment with your caregiver to find out the results. Do not assume everything is normal if you have not heard from your caregiver or the medical facility. It is important for you to follow up on all of your test results.  SEEK IMMEDIATE MEDICAL ATTENTION IF: You have more than a spotting of  blood in your stool.  Your belly is swollen (abdominal distention).  You are nauseated or vomiting.  You have a temperature over 101.  You have abdominal pain or discomfort that is severe or gets worse throughout the day.   Your upper endoscopy showed a large ulcer in your small bowel.  I did take biopsies of your stomach to rule out H. pylori which is a bacteria that can cause ulcers.  Likely this was due to NSAIDs with Mobic.  Continue on omeprazole twice daily.  Continue Carafate as needed for breakthrough pain.  Await pathology results, my office will contact you.  Your colonoscopy revealed 2 polyps which I removed successfully.  You did have an area of inflammation in the right-sided colon which I biopsied.  Recommend repeat colonoscopy in 5 years.  Follow-up with GI in 3 months. Office will notify you of appointment at a closer date.  I hope you have a great rest of your week!  Elon Alas. Abbey Chatters, D.O. Gastroenterology and Hepatology [facility]

## 2021-06-03 NOTE — Transfer of Care (Signed)
Immediate Anesthesia Transfer of Care Note  Patient: Clayton Peterson  Procedure(s) Performed: COLONOSCOPY WITH PROPOFOL ESOPHAGOGASTRODUODENOSCOPY (EGD) WITH PROPOFOL BIOPSY POLYPECTOMY INTESTINAL  Patient Location: Short Stay  Anesthesia Type:General  Level of Consciousness: drowsy  Airway & Oxygen Therapy: Patient Spontanous Breathing  Post-op Assessment: Report given to RN and Post -op Vital signs reviewed and stable  Post vital signs: Reviewed and stable  Last Vitals:  Vitals Value Taken Time  BP    Temp    Pulse 58 06/03/21 1048  Resp 16 06/03/21  1048  SpO2 98 06/03/21  1048    Last Pain:  Vitals:   06/03/21 1012  TempSrc:   PainSc: 0-No pain      Patients Stated Pain Goal: 5 ([id number])  Complications: No notable events documented.

## 2021-06-03 NOTE — Op Note (Addendum)
[facility] Patient Name: Clayton Peterson Procedure Date: 06/03/2021 9:59 AM MRN: [id number] Date of Birth: July 22, 1961 Attending MD: Elon Alas. Abbey Chatters DO CSN: [id number] Age: 60 Admit Type: Outpatient Procedure:                Upper GI endoscopy Indications:              Epigastric abdominal pain, Abnormal CT of the GI                            tract Providers:                Elon Alas. Abbey Chatters, DO, Lambert Mody, Crystal                            Page, Nelma Rothman, Technician Referring MD:              Medicines:                See the Anesthesia note for documentation of the                            administered medications Complications:            No immediate complications. Estimated Blood Loss:     Estimated blood loss was minimal. Procedure:                Pre-Anesthesia Assessment:                           - The anesthesia plan was to use monitored                            anesthesia care (MAC).                           After obtaining informed consent, the endoscope was                            passed under direct vision. Throughout the                            procedure, the patient's blood pressure, pulse, and                            oxygen saturations were monitored continuously. The                            [id number]-[id number] ([id number]) scope was introduced through the                            mouth, and advanced to the second part of duodenum.                            The upper GI endoscopy was accomplished without                            difficulty. The patient tolerated  the procedure                            well. Scope In: 10:16:20 AM Scope Out: 10:19:00 AM Total Procedure Duration: 0 hours 2 minutes 40 seconds  Findings:      There is no endoscopic evidence of bleeding, areas of erosion,       esophagitis, hiatal hernia, ulcerations or varices in the entire       esophagus.      Diffuse moderate inflammation characterized by erosions and  erythema was       found in the entire examined stomach. Biopsies were taken with a cold       forceps for Helicobacter pylori testing.      One non-obstructing non-bleeding cratered duodenal ulcer with no       stigmata of bleeding was found in the first portion of the duodenum. The       lesion was 10 mm in largest dimension. There is no evidence of       perforation. Impression:               - Gastritis. Biopsied.                           - Non-obstructing non-bleeding duodenal ulcer with                            no stigmata of bleeding. NSAID induced etiology.                            There is no evidence of perforation. Moderate Sedation:      Per Anesthesia Care Recommendation:           - Patient has a contact number available for                            emergencies. The signs and symptoms of potential                            delayed complications were discussed with the                            patient. Return to normal activities tomorrow.                            Written discharge instructions were provided to the                            patient.                           - Resume previous diet.                           - Continue present medications.                           - Await pathology results.                           -  Use Prilosec (omeprazole) 20 mg PO BID.                           - Use sucralfate tablets 1 gram PO QID.                           - Return to GI clinic in 3 months. Procedure Code(s):        --- Professional ---                           [id number], Esophagogastroduodenoscopy, flexible,                            transoral; with biopsy, single or multiple Diagnosis Code(s):        --- Professional ---                           K29.70, Gastritis, unspecified, without bleeding                           T39.395S, Adverse effect of other nonsteroidal                            anti-inflammatory drugs [NSAID], sequela                            K26.9, Duodenal ulcer, unspecified as acute or                            chronic, without hemorrhage or perforation                           R10.13, Epigastric pain                           R93.3, Abnormal findings on diagnostic imaging of                            other parts of digestive tract CPT copyright 2019 American [employer]. All rights reserved. The codes documented in this report are preliminary and upon coder review may  be revised to meet current compliance requirements. Elon Alas. Abbey Chatters, DO [place] Abbey Chatters, DO 06/03/2021 10:23:26 AM This report has been signed electronically. Number of Addenda: 0

## 2021-06-03 NOTE — Op Note (Signed)
[facility] Patient Name: Clayton Peterson Procedure Date: 06/03/2021 10:22 AM MRN: [id number] Date of Birth: 10/09/1961 Attending MD: Elon Alas. Abbey Chatters DO CSN: [id number] Age: 60 Admit Type: Outpatient Procedure:                Colonoscopy Indications:              Screening for colorectal malignant neoplasm Providers:                Elon Alas. Abbey Chatters, DO, Lambert Mody, Crystal                            Page, Nelma Rothman, Technician Referring MD:              Medicines:                See the Anesthesia note for documentation of the                            administered medications Complications:            No immediate complications. Estimated Blood Loss:     Estimated blood loss was minimal. Procedure:                Pre-Anesthesia Assessment:                           - The anesthesia plan was to use monitored                            anesthesia care (MAC).                           After obtaining informed consent, the colonoscope                            was passed under direct vision. Throughout the                            procedure, the patient's blood pressure, pulse, and                            oxygen saturations were monitored continuously. The                            PCF-HQ190L ([id number]) scope was introduced through                            the anus and advanced to the the cecum, identified                            by appendiceal orifice and ileocecal valve. The                            colonoscopy was performed without difficulty. The                            patient tolerated the procedure well.  The quality                            of the bowel preparation was evaluated using the                            BBPS [facility] Bowel Preparation Scale) with scores                            of: Right Colon = 3, Transverse Colon = 3 and Left                            Colon = 3 (entire mucosa seen well with no residual                             staining, small fragments of stool or opaque                            liquid). The total BBPS score equals 9. Scope In: 10:24:32 AM Scope Out: 10:43:32 AM Scope Withdrawal Time: 0 hours 17 minutes 1 second  Total Procedure Duration: 0 hours 19 minutes 0 seconds  Findings:      The perianal and digital rectal examinations were normal.      Non-bleeding internal hemorrhoids were found during endoscopy.      A 6 mm polyp was found in the sigmoid colon. The polyp was sessile. The       polyp was removed with a cold snare. Resection and retrieval were       complete.      A 2 mm polyp was found in the ascending colon. The polyp was sessile.       The polyp was removed with a cold biopsy forceps. Resection and       retrieval were complete.      A localized area of congested and erythematous mucosa was found in the       cecum. Biopsies were taken with a cold forceps for histology. Impression:               - Non-bleeding internal hemorrhoids.                           - One 6 mm polyp in the sigmoid colon, removed with                            a cold snare. Resected and retrieved.                           - One 2 mm polyp in the ascending colon, removed                            with a cold biopsy forceps. Resected and retrieved.                           - Congested and erythematous mucosa in the cecum.  Biopsied. Moderate Sedation:      Per Anesthesia Care Recommendation:           - Patient has a contact number available for                            emergencies. The signs and symptoms of potential                            delayed complications were discussed with the                            patient. Return to normal activities tomorrow.                            Written discharge instructions were provided to the                            patient.                           - Resume previous diet.                           - Continue present  medications.                           - Await pathology results.                           - Repeat colonoscopy in 5 years for surveillance.                           - Return to GI clinic in 3 months. Procedure Code(s):        --- Professional ---                           [phone], Colonoscopy, flexible; with removal of                            tumor(s), polyp(s), or other lesion(s) by snare                            technique                           10175, 64, Colonoscopy, flexible; with biopsy,                            single or multiple Diagnosis Code(s):        --- Professional ---                           Z12.11, Encounter for screening for malignant                            neoplasm of colon  K63.5, Polyp of colon                           K63.89, Other specified diseases of intestine                           K64.8, Other hemorrhoids CPT copyright 2019 American Medical Association. All rights reserved. The codes documented in this report are preliminary and upon coder review may  be revised to meet current compliance requirements. Elon Alas. Abbey Chatters, DO [place] Abbey Chatters, DO 06/03/2021 10:47:50 AM This report has been signed electronically. Number of Addenda: 0

## 2021-06-03 NOTE — Anesthesia Preprocedure Evaluation (Addendum)
Anesthesia Evaluation  Patient identified by MRN, date of birth, ID band Patient awake    Reviewed: Allergy & Precautions, NPO status , Patient's Chart, lab work & pertinent test results  Airway Mallampati: II  TM Distance: >3 FB Neck ROM: Full    Dental  (+) Missing, Loose, Dental Advisory Given, Poor Dentition   Pulmonary neg pulmonary ROS, Current Smoker and Patient abstained from smoking.,    Pulmonary exam normal breath sounds clear to auscultation       Cardiovascular hypertension, + CAD and + Past MI  Normal cardiovascular exam Rhythm:Regular Rate:Normal     Neuro/Psych negative neurological ROS  negative psych ROS   GI/Hepatic negative GI ROS, Neg liver ROS,   Endo/Other  negative endocrine ROSdiabetes  Renal/GU negative Renal ROS  negative genitourinary   Musculoskeletal negative musculoskeletal ROS (+)   Abdominal   Peds negative pediatric ROS (+)  Hematology negative hematology ROS (+)   Anesthesia Other Findings   Reproductive/Obstetrics negative OB ROS                            Anesthesia Physical Anesthesia Plan  ASA: 3  Anesthesia Plan: General   Post-op Pain Management:    Induction: Intravenous  PONV Risk Score and Plan: TIVA  Airway Management Planned: Nasal Cannula  Additional Equipment:   Intra-op Plan:   Post-operative Plan:   Informed Consent: I have reviewed the patients History and Physical, chart, labs and discussed the procedure including the risks, benefits and alternatives for the proposed anesthesia with the patient or authorized representative who has indicated his/her understanding and acceptance.       Plan Discussed with: CRNA  Anesthesia Plan Comments:         Anesthesia Quick Evaluation

## 2021-06-03 NOTE — Anesthesia Postprocedure Evaluation (Signed)
Anesthesia Post Note  Patient: Dealer  Procedure(s) Performed: COLONOSCOPY WITH PROPOFOL ESOPHAGOGASTRODUODENOSCOPY (EGD) WITH PROPOFOL BIOPSY POLYPECTOMY INTESTINAL  Patient location during evaluation: PACU Anesthesia Type: General Level of consciousness: awake and alert Pain management: pain level controlled Vital Signs Assessment: post-procedure vital signs reviewed and stable Respiratory status: spontaneous breathing, nonlabored ventilation, respiratory function stable and patient connected to nasal cannula oxygen Cardiovascular status: blood pressure returned to baseline and stable Postop Assessment: no apparent nausea or vomiting Anesthetic complications: no   No notable events documented.   Last Vitals:  Vitals:   06/03/21 0843 06/03/21 1047  BP: (!) 146/73 128/62  Pulse: (!) 51   Resp: 14 16  Temp: 36.7 C 37.1 C  SpO2: 100% 98%    Last Pain:  Vitals:   06/03/21 1047  TempSrc: Oral  PainSc: 0-No pain                 Nicanor Alcon

## 2021-06-06 LAB — SURGICAL PATHOLOGY

## 2021-06-10 ENCOUNTER — Encounter (HOSPITAL_COMMUNITY): Payer: Self-pay | Admitting: Internal Medicine

## 2021-08-29 ENCOUNTER — Encounter: Payer: Self-pay | Admitting: Internal Medicine

## 2022-02-13 ENCOUNTER — Emergency Department (HOSPITAL_COMMUNITY): Payer: Medicare Other

## 2022-02-13 ENCOUNTER — Inpatient Hospital Stay (HOSPITAL_COMMUNITY)
Admission: EM | Admit: 2022-02-13 | Discharge: 2022-02-17 | DRG: 440 | Disposition: A | Discharge status: Home / Self Care | Payer: Medicare Other | Attending: Internal Medicine | Admitting: Internal Medicine

## 2022-02-13 ENCOUNTER — Encounter (HOSPITAL_COMMUNITY): Payer: Self-pay | Admitting: *Deleted

## 2022-02-13 ENCOUNTER — Other Ambulatory Visit: Payer: Self-pay

## 2022-02-13 DIAGNOSIS — K858 Other acute pancreatitis without necrosis or infection: Secondary | ICD-10-CM

## 2022-02-13 DIAGNOSIS — F17210 Nicotine dependence, cigarettes, uncomplicated: Secondary | ICD-10-CM | POA: Diagnosis present

## 2022-02-13 DIAGNOSIS — E785 Hyperlipidemia, unspecified: Secondary | ICD-10-CM

## 2022-02-13 DIAGNOSIS — I129 Hypertensive chronic kidney disease with stage 1 through stage 4 chronic kidney disease, or unspecified chronic kidney disease: Secondary | ICD-10-CM | POA: Diagnosis present

## 2022-02-13 DIAGNOSIS — E1165 Type 2 diabetes mellitus with hyperglycemia: Secondary | ICD-10-CM | POA: Diagnosis present

## 2022-02-13 DIAGNOSIS — Z791 Long term (current) use of non-steroidal anti-inflammatories (NSAID): Secondary | ICD-10-CM | POA: Diagnosis not present

## 2022-02-13 DIAGNOSIS — N1831 Chronic kidney disease, stage 3a: Secondary | ICD-10-CM

## 2022-02-13 DIAGNOSIS — K859 Acute pancreatitis without necrosis or infection, unspecified: Secondary | ICD-10-CM | POA: Diagnosis present

## 2022-02-13 DIAGNOSIS — E782 Mixed hyperlipidemia: Secondary | ICD-10-CM | POA: Diagnosis present

## 2022-02-13 DIAGNOSIS — Z79899 Other long term (current) drug therapy: Secondary | ICD-10-CM

## 2022-02-13 DIAGNOSIS — K760 Fatty (change of) liver, not elsewhere classified: Secondary | ICD-10-CM | POA: Diagnosis present

## 2022-02-13 DIAGNOSIS — I252 Old myocardial infarction: Secondary | ICD-10-CM | POA: Diagnosis not present

## 2022-02-13 DIAGNOSIS — N1832 Chronic kidney disease, stage 3b: Secondary | ICD-10-CM

## 2022-02-13 DIAGNOSIS — K5900 Constipation, unspecified: Secondary | ICD-10-CM | POA: Diagnosis present

## 2022-02-13 DIAGNOSIS — E669 Obesity, unspecified: Secondary | ICD-10-CM | POA: Diagnosis present

## 2022-02-13 DIAGNOSIS — Z6832 Body mass index (BMI) 32.0-32.9, adult: Secondary | ICD-10-CM

## 2022-02-13 DIAGNOSIS — I2510 Atherosclerotic heart disease of native coronary artery without angina pectoris: Secondary | ICD-10-CM | POA: Diagnosis present

## 2022-02-13 DIAGNOSIS — E1159 Type 2 diabetes mellitus with other circulatory complications: Secondary | ICD-10-CM

## 2022-02-13 DIAGNOSIS — E119 Type 2 diabetes mellitus without complications: Secondary | ICD-10-CM

## 2022-02-13 DIAGNOSIS — Z91030 Bee allergy status: Secondary | ICD-10-CM

## 2022-02-13 DIAGNOSIS — Z91048 Other nonmedicinal substance allergy status: Secondary | ICD-10-CM | POA: Diagnosis not present

## 2022-02-13 DIAGNOSIS — Z7982 Long term (current) use of aspirin: Secondary | ICD-10-CM

## 2022-02-13 DIAGNOSIS — E1122 Type 2 diabetes mellitus with diabetic chronic kidney disease: Secondary | ICD-10-CM | POA: Diagnosis present

## 2022-02-13 DIAGNOSIS — N189 Chronic kidney disease, unspecified: Secondary | ICD-10-CM

## 2022-02-13 DIAGNOSIS — I10 Essential (primary) hypertension: Secondary | ICD-10-CM | POA: Diagnosis present

## 2022-02-13 DIAGNOSIS — K8500 Idiopathic acute pancreatitis without necrosis or infection: Secondary | ICD-10-CM | POA: Diagnosis not present

## 2022-02-13 LAB — CBC WITH DIFFERENTIAL/PLATELET
Abs Immature Granulocytes: 0.15 10*3/uL — ABNORMAL HIGH (ref 0.00–0.07)
Basophils Absolute: 0.1 10*3/uL (ref 0.0–0.1)
Basophils Relative: 1 %
Eosinophils Absolute: 0.5 10*3/uL (ref 0.0–0.5)
Eosinophils Relative: 3 %
HCT: 52.4 % — ABNORMAL HIGH (ref 39.0–52.0)
Hemoglobin: 17.5 g/dL — ABNORMAL HIGH (ref 13.0–17.0)
Immature Granulocytes: 1 %
Lymphocytes Relative: 4 %
Lymphs Abs: 0.6 10*3/uL — ABNORMAL LOW (ref 0.7–4.0)
MCH: 29.5 pg (ref 26.0–34.0)
MCHC: 33.4 g/dL (ref 30.0–36.0)
MCV: 88.2 fL (ref 80.0–100.0)
Monocytes Absolute: 1.2 10*3/uL — ABNORMAL HIGH (ref 0.1–1.0)
Monocytes Relative: 7 %
Neutro Abs: 15.4 10*3/uL — ABNORMAL HIGH (ref 1.7–7.7)
Neutrophils Relative %: 84 %
Platelets: 196 10*3/uL (ref 150–400)
RBC: 5.94 MIL/uL — ABNORMAL HIGH (ref 4.22–5.81)
RDW: 15 % (ref 11.5–15.5)
WBC: 18 10*3/uL — ABNORMAL HIGH (ref 4.0–10.5)
nRBC: 0.1 % (ref 0.0–0.2)

## 2022-02-13 LAB — CBC
HCT: 51.6 % (ref 39.0–52.0)
Hemoglobin: 17.6 g/dL — ABNORMAL HIGH (ref 13.0–17.0)
MCH: 30.2 pg (ref 26.0–34.0)
MCHC: 34.1 g/dL (ref 30.0–36.0)
MCV: 88.7 fL (ref 80.0–100.0)
Platelets: 196 10*3/uL (ref 150–400)
RBC: 5.82 MIL/uL — ABNORMAL HIGH (ref 4.22–5.81)
RDW: 15.4 % (ref 11.5–15.5)
WBC: 18.9 10*3/uL — ABNORMAL HIGH (ref 4.0–10.5)
nRBC: 0 % (ref 0.0–0.2)

## 2022-02-13 LAB — TROPONIN I (HIGH SENSITIVITY)
Troponin I (High Sensitivity): 6 ng/L (ref <18)
Troponin I (High Sensitivity): 6 ng/L (ref <18)

## 2022-02-13 LAB — BASIC METABOLIC PANEL
Anion gap: 12 (ref 5–15)
BUN: 23 mg/dL — ABNORMAL HIGH (ref 6–20)
CO2: 24 mmol/L (ref 22–32)
Calcium: 10.1 mg/dL (ref 8.9–10.3)
Chloride: 99 mmol/L (ref 98–111)
Creatinine, Ser: 1.59 mg/dL — ABNORMAL HIGH (ref 0.61–1.24)
GFR, Estimated: 49 mL/min — ABNORMAL LOW (ref >60)
Glucose, Bld: 245 mg/dL — ABNORMAL HIGH (ref 70–99)
Potassium: 4.9 mmol/L (ref 3.5–5.1)
Sodium: 135 mmol/L (ref 135–145)

## 2022-02-13 LAB — HEPATIC FUNCTION PANEL
ALT: 28 U/L (ref 0–44)
AST: 22 U/L (ref 15–41)
Albumin: 4.3 g/dL (ref 3.5–5.0)
Alkaline Phosphatase: 79 U/L (ref 38–126)
Bilirubin, Direct: 0.1 mg/dL (ref 0.0–0.2)
Indirect Bilirubin: 0.7 mg/dL (ref 0.3–0.9)
Total Bilirubin: 0.8 mg/dL (ref 0.3–1.2)
Total Protein: 7.9 g/dL (ref 6.5–8.1)

## 2022-02-13 LAB — LIPASE, BLOOD: Lipase: 284 U/L — ABNORMAL HIGH (ref 11–51)

## 2022-02-13 LAB — TRIGLYCERIDES: Triglycerides: 709 mg/dL — ABNORMAL HIGH (ref <150)

## 2022-02-13 LAB — GLUCOSE, CAPILLARY: Glucose-Capillary: 250 mg/dL — ABNORMAL HIGH (ref 70–99)

## 2022-02-13 MED ORDER — LACTATED RINGERS IV BOLUS
1000.0000 mL | Freq: Once | INTRAVENOUS | Status: AC
  Administered 2022-02-13: 1000 mL via INTRAVENOUS

## 2022-02-13 MED ORDER — ENOXAPARIN SODIUM 40 MG/0.4ML IJ SOSY
40.0000 mg | PREFILLED_SYRINGE | Freq: Every day | INTRAMUSCULAR | Status: DC
  Administered 2022-02-14 – 2022-02-16 (×3): 40 mg via SUBCUTANEOUS
  Filled 2022-02-13 (×3): qty 0.4

## 2022-02-13 MED ORDER — SODIUM CHLORIDE 0.9 % IV SOLN: INTRAVENOUS | Status: AC

## 2022-02-13 MED ORDER — METOPROLOL TARTRATE 50 MG PO TABS
50.0000 mg | ORAL_TABLET | Freq: Two times a day (BID) | ORAL | Status: DC
  Administered 2022-02-13 – 2022-02-17 (×8): 50 mg via ORAL
  Filled 2022-02-13 (×8): qty 1

## 2022-02-13 MED ORDER — ONDANSETRON HCL 4 MG PO TABS: 4.0000 mg | ORAL_TABLET | Freq: Four times a day (QID) | ORAL | Status: DC | PRN

## 2022-02-13 MED ORDER — HYDROMORPHONE HCL 1 MG/ML IJ SOLN
1.0000 mg | Freq: Once | INTRAMUSCULAR | Status: AC
  Administered 2022-02-13: 1 mg via INTRAVENOUS
  Filled 2022-02-13: qty 1

## 2022-02-13 MED ORDER — ACETAMINOPHEN 650 MG RE SUPP: 650.0000 mg | Freq: Four times a day (QID) | RECTAL | Status: DC | PRN

## 2022-02-13 MED ORDER — HYDROMORPHONE HCL 1 MG/ML IJ SOLN
0.5000 mg | INTRAMUSCULAR | Status: DC | PRN
  Administered 2022-02-13: 0.5 mg via INTRAVENOUS
  Administered 2022-02-14 – 2022-02-16 (×13): 1 mg via INTRAVENOUS
  Filled 2022-02-13: qty 1
  Filled 2022-02-13: qty 0.5
  Filled 2022-02-13 (×12): qty 1

## 2022-02-13 MED ORDER — ACETAMINOPHEN 325 MG PO TABS: 650.0000 mg | ORAL_TABLET | Freq: Four times a day (QID) | ORAL | Status: DC | PRN

## 2022-02-13 MED ORDER — ONDANSETRON HCL 4 MG/2ML IJ SOLN
4.0000 mg | Freq: Once | INTRAMUSCULAR | Status: AC
  Administered 2022-02-13: 4 mg via INTRAVENOUS
  Filled 2022-02-13: qty 2

## 2022-02-13 MED ORDER — MORPHINE SULFATE (PF) 4 MG/ML IV SOLN
4.0000 mg | Freq: Once | INTRAVENOUS | Status: AC
  Administered 2022-02-13: 4 mg via INTRAVENOUS
  Filled 2022-02-13: qty 1

## 2022-02-13 MED ORDER — SODIUM CHLORIDE 0.9 % IV BOLUS
1000.0000 mL | Freq: Once | INTRAVENOUS | Status: AC
Start: 2022-02-13 — End: 2022-02-13
  Administered 2022-02-13: 1000 mL via INTRAVENOUS

## 2022-02-13 MED ORDER — ONDANSETRON HCL 4 MG/2ML IJ SOLN
4.0000 mg | Freq: Four times a day (QID) | INTRAMUSCULAR | Status: DC | PRN
  Administered 2022-02-15: 4 mg via INTRAVENOUS
  Filled 2022-02-13: qty 2

## 2022-02-13 MED ORDER — PANTOPRAZOLE SODIUM 40 MG IV SOLR
40.0000 mg | Freq: Every day | INTRAVENOUS | Status: DC
  Administered 2022-02-13 – 2022-02-16 (×4): 40 mg via INTRAVENOUS
  Filled 2022-02-13 (×4): qty 10

## 2022-02-13 MED ORDER — IOHEXOL 350 MG/ML SOLN
100.0000 mL | Freq: Once | INTRAVENOUS | Status: AC | PRN
Start: 2022-02-13 — End: 2022-02-13
  Administered 2022-02-13: 100 mL via INTRAVENOUS

## 2022-02-13 MED ORDER — INSULIN ASPART 100 UNIT/ML IJ SOLN
0.0000 [IU] | INTRAMUSCULAR | Status: DC
  Administered 2022-02-13 – 2022-02-14 (×2): 2 [IU] via SUBCUTANEOUS
  Administered 2022-02-14: 3 [IU] via SUBCUTANEOUS
  Administered 2022-02-14 (×2): 2 [IU] via SUBCUTANEOUS
  Administered 2022-02-14: 3 [IU] via SUBCUTANEOUS
  Administered 2022-02-15 (×2): 1 [IU] via SUBCUTANEOUS
  Administered 2022-02-15 (×2): 2 [IU] via SUBCUTANEOUS
  Administered 2022-02-15 – 2022-02-17 (×9): 1 [IU] via SUBCUTANEOUS
  Administered 2022-02-17: 3 [IU] via SUBCUTANEOUS
  Administered 2022-02-17: 1 [IU] via SUBCUTANEOUS

## 2022-02-13 NOTE — ED Notes (Signed)
Hospitalist in room.

## 2022-02-13 NOTE — ED Notes (Signed)
SPO2 dropped after morphine. H/o OSA. Placed on O2 [place] 2L with improvement ?

## 2022-02-13 NOTE — ED Provider Notes (Signed)
?[facility] ?Provider Note ? ? ?CSN: [id number] ?Arrival date & time: 02/13/22  1628 ? ?  ? ?History ? ?Chief Complaint  ?Patient presents with  ? Chest Pain  ? ? ?Clayton Peterson is a 61 y.o. male. ? ?61 year old male presents today for evaluation of epigastric/chest pain onset around 7 PM last night.  Patient endorses breaking out in a sweat, but denies radiation of the pain to his neck, either upper extremities.  He does state the pain goes to his lower mid back.  Denies nausea, vomiting, palpitations, lightheadedness.  Has history of CAD and MI.  He states he is unable to recall the quality of his chest pain with his previous MI, he states the most prominent symptom was inability to breathe.  He does have current mild shortness of breath.  States he was also diagnosed with gastric ulcer in August 2022.  Symptoms onset was 1 hour following his supper last night.  He does take PPI chronically.  Denies history of alcohol use. ? ?The history is provided by the patient. No language interpreter was used.  ? ?  ? ?Home Medications ?Prior to Admission medications   ?Medication Sig Start Date End Date Taking? Authorizing Provider  ?acetaminophen (TYLENOL) 325 MG tablet Take 325 mg by mouth every 4 (four) hours as needed for pain. 11/16/19   [provider]  ?aspirin 81 MG EC tablet Take 81 mg by mouth daily.    [provider]  ?Cholecalciferol (VITAMIN D3) 125 MCG (5000 UT) TABS Take by mouth daily.    [provider]  ?fenofibrate (TRICOR) 145 MG tablet Take 145 mg by mouth daily. 03/18/21   [provider]  ?gabapentin (NEURONTIN) 100 MG capsule Take 100 mg by mouth daily. 05/05/21   [provider]  ?glimepiride (AMARYL) 4 MG tablet Take 4 mg by mouth 2 (two) times daily. 03/29/21   [provider]  ?lisinopril (ZESTRIL) 20 MG tablet Take 20 mg by mouth daily. 03/29/21   [provider]  ?meloxicam (MOBIC) 15 MG tablet Take 15 mg by mouth  daily. 03/29/21   [provider]  ?metFORMIN (GLUCOPHAGE) 1000 MG tablet Take 1,000 mg by mouth 2 (two) times daily. 03/18/21   [provider]  ?metoprolol tartrate (LOPRESSOR) 50 MG tablet Take 50 mg by mouth 2 (two) times daily. 03/18/21   [provider]  ?omeprazole (PRILOSEC) 20 MG capsule Take 1 capsule (20 mg total) by mouth 2 (two) times daily before a meal. 05/25/21   Erenest Rasher, PA-C  ?polyethylene glycol-electrolytes (NULYTELY) 420 g solution As directed 05/26/21   Mahala Menghini, PA-C  ?rosuvastatin (CRESTOR) 40 MG tablet Take 40 mg by mouth at bedtime. 03/29/21   [provider]  ?sucralfate (CARAFATE) 1 GM/10ML suspension Take 10 mLs (1 g total) by mouth 4 (four) times daily -  with meals and at bedtime. 05/22/21   Fredia Sorrow, MD  ?traMADol (ULTRAM) 50 MG tablet Take 50 mg by mouth every 6 (six) hours as needed.    [provider]  ?   ? ?Allergies    ?Bee venom and Coconut oil   ? ?Review of Systems   ?Review of Systems  ?Constitutional:  Negative for activity change, chills and fever.  ?Respiratory:  Positive for shortness of breath.   ?Cardiovascular:  Positive for chest pain.  ?Gastrointestinal:  Positive for abdominal pain and nausea. Negative for vomiting.  ?Neurological:  Negative for syncope and weakness.  ?All  other systems reviewed and are negative. ? ?Physical Exam ?Updated Vital Signs ?BP (!) 159/97   Pulse (!) 108   Temp 99.1 ?F (37.3 ?C) (Oral)   Resp (!) 30   SpO2 92%  ?Physical Exam ?Vitals and nursing note reviewed.  ?Constitutional:   ?   General: He is not in acute distress. ?   Appearance: Normal appearance. He is not ill-appearing.  ?HENT:  ?   Head: Normocephalic and atraumatic.  ?   Nose: Nose normal.  ?Eyes:  ?   General: No scleral icterus. ?   Extraocular Movements: Extraocular movements intact.  ?   Conjunctiva/sclera: Conjunctivae normal.  ?Cardiovascular:  ?   Rate and Rhythm: Regular rhythm. Tachycardia present.  ?    Pulses: Normal pulses.  ?Pulmonary:  ?   Effort: Pulmonary effort is normal. No respiratory distress.  ?   Breath sounds: Normal breath sounds. No wheezing or rales.  ?Abdominal:  ?   General: There is distension.  ?   Tenderness: There is abdominal tenderness. There is guarding. There is no right CVA tenderness or left CVA tenderness.  ?Musculoskeletal:     ?   General: Normal range of motion.  ?   Cervical back: Normal range of motion.  ?   Right lower leg: No edema.  ?   Left lower leg: No edema.  ?Skin: ?   General: Skin is warm and dry.  ?Neurological:  ?   General: No focal deficit present.  ?   Mental Status: He is alert. Mental status is at baseline.  ? ? ?ED Results / Procedures / Treatments   ?Labs ?(all labs ordered are listed, but only abnormal results are displayed) ?Labs Reviewed  ?CBC - Abnormal; Notable for the following components:  ?    Result Value  ? WBC 18.9 (*)   ? RBC 5.82 (*)   ? Hemoglobin 17.6 (*)   ? All other components within normal limits  ?BASIC METABOLIC PANEL  ?CBC WITH DIFFERENTIAL/PLATELET  ?LIPASE, BLOOD  ?HEPATIC FUNCTION PANEL  ?TROPONIN I (HIGH SENSITIVITY)  ? ? ?EKG ?None ? ?Radiology ?DG Chest 2 View ? ?Result Date: 02/13/2022 ?CLINICAL DATA:  Chest pain. EXAM: CHEST - 2 VIEW COMPARISON:  Chest x-ray 05/22/2021 FINDINGS: The heart size and mediastinal contours are within normal limits. Both lungs are clear. The visualized skeletal structures are unremarkable. IMPRESSION: No active cardiopulmonary disease. Electronically Signed   By: Ronney Asters M.D.   On: 02/13/2022 17:39   ? ?Procedures ?Procedures  ? ? ?Medications Ordered in ED ?Medications  ?sodium chloride 0.9 % bolus 1,000 mL (has no administration in time range)  ?ondansetron (ZOFRAN) injection 4 mg (has no administration in time range)  ?morphine (PF) 4 MG/ML injection 4 mg (has no administration in time range)  ? ? ?ED Course/ Medical Decision Making/ A&P ?Clinical Course as of 02/13/22 1831  ?Mon Feb 13, 2022   ?1822 WBC(!): 18.9 ?CBC with leukocytosis of 18,000 with 84% neutrophil count, without anemia.  Initial troponin of 6.  BMP with glucose of 245, creatinine 1.59 which is improved from his baseline otherwise without acute findings.  Lipase of 284.  CTA chest to evaluate for PE, CT abdomen pelvis still pending.  Chest x-ray does not show acute cardiopulmonary process.  Does report moderate improvement in pain following pain medication.  Will provide additional fluids given concern for pancreatitis. [AA]  ?  ?Clinical Course User Index ?Maudie Mercury, PA-C  ? ?                        ?  Medical Decision Making ?Amount and/or Complexity of Data Reviewed ?Labs: ordered. Decision-making details documented in ED Course. ?Radiology: ordered. ? ?Risk ?Prescription drug management. ? ? ?Medical Decision Making / ED Course ? ? ?This patient presents to the ED for concern of epigastric abdominal pain, chest pain, shortness of breath, this involves an extensive number of treatment options, and is a complaint that carries with it a high risk of complications and morbidity.  The differential diagnosis includes ACS, PE, dissection, pancreatitis, cholecystitis, SBO ? ?MDM: ?61 year old male presents today for evaluation of epigastric/chest pain since 7 PM last night.  He does have history of CAD, MI, gastritis, SBO.  Has history of bowel resection and appendectomy.  Reports no improvement in abdominal pain since onset.  Was not able to sleep last night.  Tachycardic on exam.  Abdomen tender to palpation with guarding diffusely.  Pain radiates to his left chest and to his back.  Will provide fluids, pain medication, nausea medication and obtain ACS, and abdominal pain labs.  Will order CTA chest, and CT abdomen pelvis. ?Patient at the end of my shift is awaiting CTA chest for PE study, and CT abdomen pelvis.  He has been signed out to oncoming provider for follow-up of the studies and appropriate disposition.  Given elevated lipase,  concern for pancreatitis with leukocytosis patient will likely benefit from admission for acute pancreatitis. ? ? ?Lab Tests: ?-I ordered, reviewed, and interpreted labs.   ?The pertinent results include:   ?Labs R

## 2022-02-13 NOTE — H&P (Signed)
?History and Physical  ? ? ?Abraham Espericueta CWC:[id number] DOB: September 11, 1961 DOA: 02/13/2022 ? ?PCP: Alanson Puls, [facility]  ? ?Patient coming from: Home  ? ?Chief Complaint: Epigastric pain  ? ?HPI: Clayton Peterson is a pleasant 61 y.o. male with medical history significant for hypertension, type 2 diabetes mellitus, hyperlipidemia, duodenal ulcer due to NSAIDs, and CAD, now presenting to the emergency department for evaluation of epigastric pain.  Patient reports that he was in his usual state until the evening of 02/11/2022 when he developed acute pain in the epigastrium approximately 1 hour after eating.  Pain has progressively worsened since then, is constant, has some radiation to his back, associated with loss of appetite, but no vomiting.  He has not experienced these same symptoms previously.  He denies any alcohol use.  No new medications.  Denies fevers. ? ?ED Course: Upon arrival to the ED, patient is found to be afebrile and tachycardic to 120s with stable blood pressure.  EKG features sinus tachycardia 314.  Chest x-ray negative for acute cardiopulmonary disease.  Chemistry panel notable for glucose 235 and creatinine 1.59.  Lipase elevated to 284.  CBC features a leukocytosis to 18,000 and mild polycythemia.  CT findings are consistent with acute pancreatitis including edema and stranding about the pancreas with decreased enhancement at the tail which could reflect necrosis or edema.  Patient was given 2 L of IV fluids, IV analgesia, and Zofran in the ED. ? ?Review of Systems:  ?All other systems reviewed and apart from HPI, are negative. ? ?Past Medical History:  ?Diagnosis Date  ? Coronary artery disease   ? Diabetes mellitus without complication ([place])   ? Heart attack ([place])   ? High cholesterol   ? Hypertension   ? ? ?Past Surgical History:  ?Procedure Laterality Date  ? ABDOMINAL AORTIC ANEURYSM REPAIR    ? BACK SURGERY    ? x2  ? BIOPSY  06/03/2021  ? Procedure: BIOPSY;  Surgeon: Eloise Harman, DO;   [facility];  Service: Endoscopy;;  ? CARDIAC CATHETERIZATION    ? stent placed on right side ofo heart  ? COLONOSCOPY WITH PROPOFOL N/A 06/03/2021  ? Procedure: COLONOSCOPY WITH PROPOFOL;  Surgeon: Eloise Harman, DO;  Location: [facility];  Service: Endoscopy;  Laterality: N/A;  10:00am  ? ESOPHAGOGASTRODUODENOSCOPY (EGD) WITH PROPOFOL N/A 06/03/2021  ? Procedure: ESOPHAGOGASTRODUODENOSCOPY (EGD) WITH PROPOFOL;  Surgeon: Eloise Harman, DO;  Location: [facility];  Service: Endoscopy;  Laterality: N/A;  ? KNEE SURGERY Left   ? PARTIAL COLECTOMY    ? POLYPECTOMY  06/03/2021  ? Procedure: POLYPECTOMY INTESTINAL;  Surgeon: Eloise Harman, DO;  Location: [facility];  Service: Endoscopy;;  ? ? ?Social History:  ? reports that he has been smoking cigarettes. He has been smoking an average of 1 pack per day. He has never been exposed to tobacco smoke. He has never used smokeless tobacco. He reports current alcohol use. He reports that he does not use drugs. ? ?Allergies  ?Allergen Reactions  ? Bee Venom Anaphylaxis  ? Coconut Oil Anaphylaxis  ? ? ?History reviewed. No pertinent family history. ? ? ?Prior to Admission medications   ?Medication Sig Start Date End Date Taking? Authorizing Provider  ?acetaminophen (TYLENOL) 325 MG tablet Take 325 mg by mouth every 4 (four) hours as needed for pain. 11/16/19   [provider]  ?aspirin 81 MG EC tablet Take 81 mg by mouth daily.    [provider]  ?Cholecalciferol (VITAMIN D3) 125 MCG (5000 UT) TABS Take by mouth daily.    [provider]  ?fenofibrate (TRICOR) 145 MG tablet Take 145 mg by mouth daily. 03/18/21   [provider]  ?gabapentin (NEURONTIN) 100 MG capsule Take 100 mg by mouth daily. 05/05/21   [provider]  ?glimepiride (AMARYL) 4 MG tablet Take 4 mg by mouth 2 (two) times daily. 03/29/21   [provider]  ?lisinopril (ZESTRIL) 20 MG tablet Take 20 mg by mouth daily. 03/29/21   [provider]  ?meloxicam (MOBIC) 15 MG tablet Take 15 mg by mouth daily. 03/29/21   [provider]  ?metFORMIN (GLUCOPHAGE) 1000 MG tablet Take 1,000 mg by mouth 2 (two) times daily. 03/18/21   [provider]  ?metoprolol tartrate (LOPRESSOR) 50 MG tablet Take 50 mg by mouth 2 (two) times daily. 03/18/21   [provider]  ?omeprazole (PRILOSEC) 20 MG capsule Take 1 capsule (20 mg total) by mouth 2 (two) times daily before a meal. 05/25/21   Erenest Rasher, PA-C  ?polyethylene glycol-electrolytes (NULYTELY) 420 g solution As directed 05/26/21   Mahala Menghini, PA-C  ?rosuvastatin (CRESTOR) 40 MG tablet Take 40 mg by mouth at bedtime. 03/29/21   [provider]  ?sucralfate (CARAFATE) 1 GM/10ML suspension Take 10 mLs (1 g total) by mouth 4 (four) times daily -  with meals and at bedtime. 05/22/21   Fredia Sorrow, MD  ?traMADol (ULTRAM) 50 MG tablet Take 50 mg by mouth every 6 (six) hours as needed.    [provider]  ? ? ?Physical Exam: ?Vitals:  ? 02/13/22 2145 02/13/22 2200 02/13/22 2212 02/13/22 2220  ?BP:  (!) 150/82 (!) 150/82   ?Pulse: (!) 119  (!) 117   ?Resp:   (!) 21   ?Temp:      ?TempSrc:      ?SpO2: 94%  95%   ?Weight:    98 kg  ?Height:    '5\' 9"'$  (1.753 m)  ? ? ?Constitutional: NAD, appears uncomfortable   ?Eyes: PERTLA, lids and conjunctivae normal ?ENMT: Mucous membranes are moist. Posterior pharynx clear of any exudate or lesions.   ?Neck: supple, no masses  ?Respiratory: no wheezing, no crackles. No accessory muscle use.  ?Cardiovascular: Rate ~120 and regular. No extremity edema.  ?Abdomen:  soft, tender in epigastrium, no rebound pain or guarding. Bowel sounds active.  ?Musculoskeletal: no clubbing / cyanosis. No joint deformity upper and lower extremities.   ?Skin: no significant rashes, lesions, ulcers. Warm, dry, well-perfused. ?Neurologic: CN 2-12 grossly intact. Moving all extremities. Alert and oriented.  ?Psychiatric: Pleasant. Cooperative.   ? ? ?Labs and Imaging on Admission: I have personally reviewed following labs and imaging studies ? ?CBC: ?Recent Labs  ?Lab 02/13/22 ?1711  ?WBC 18.0*  18.9*  ?NEUTROABS 15.4*  ?HGB 17.5*  17.6*  ?HCT 52.4*  51.6  ?MCV 88.2  88.7  ?PLT 196  196  ? ?Basic Metabolic Panel: ?Recent Labs  ?Lab 02/13/22 ?1711  ?NA 135  ?K 4.9  ?CL 99  ?CO2 24  ?GLUCOSE 245*  ?BUN 23*  ?CREATININE 1.59*  ?CALCIUM 10.1  ? ?GFR: ?Estimated Creatinine Clearance: 57 mL/min (A) (by C-G formula based on SCr of 1.59 mg/dL (H)). ?Liver Function Tests: ?Recent Labs  ?Lab 02/13/22 ?1711  ?AST 22  ?ALT 28  ?ALKPHOS 79  ?BILITOT 0.8  ?PROT 7.9  ?ALBUMIN 4.3  ? ?Recent Labs  ?Lab 02/13/22 ?1711  ?LIPASE 284*  ? ?  No results for input(s): AMMONIA in the last 168 hours. ?Coagulation Profile: ?No results for input(s): INR, PROTIME in the last 168 hours. ?Cardiac Enzymes: ?No results for input(s): CKTOTAL, CKMB, CKMBINDEX, TROPONINI in the last 168 hours. ?BNP (last 3 results) ?No results for input(s): PROBNP in the last 8760 hours. ?HbA1C: ?No results for input(s): HGBA1C in the last 72 hours. ?CBG: ?No results for input(s): GLUCAP in the last 168 hours. ?Lipid Profile: ?No results for input(s): CHOL, HDL, LDLCALC, TRIG, CHOLHDL, LDLDIRECT in the last 72 hours. ?Thyroid Function Tests: ?No results for input(s): TSH, T4TOTAL, FREET4, T3FREE, THYROIDAB in the last 72 hours. ?Anemia Panel: ?No results for input(s): VITAMINB12, FOLATE, FERRITIN, TIBC, IRON, RETICCTPCT in the last 72 hours. ?Urine analysis: ?No results found for: COLORURINE, APPEARANCEUR, [place], [place], [place], [place], [place], KETONESUR, PROTEINUR, [place], NITRITE, LEUKOCYTESUR ?Sepsis Labs: ?'@LABRCNTIP'$ (procalcitonin:4,lacticidven:4) ?)No results found for this or any previous visit (from the past 240 hour(s)).  ? ?Radiological Exams on Admission: ?DG Chest 2 View ? ?Result Date: 02/13/2022 ?CLINICAL DATA:  Chest pain. EXAM: CHEST - 2 VIEW COMPARISON:  Chest x-ray  05/22/2021 FINDINGS: The heart size and mediastinal contours are within normal limits. Both lungs are clear. The visualized skeletal structures are unremarkable. IMPRESSION: No active cardiopulmonary disease. Electron

## 2022-02-13 NOTE — ED Triage Notes (Signed)
Pt c/o chest pain that started a couple of days and has gotten progressively worse ?

## 2022-02-13 NOTE — ED Provider Notes (Signed)
? ?Patient signed out to me by Evlyn Courier, PA-C pending completion of work-up. ? ? ?Patient here with complaints of abdominal pain and chest pain for 2 days.  Symptoms have progressively worsened.  He describes having pain to his entire abdomen. ? ?Work-up this evening included baseline labs, lipase, 2 troponins, CT abdomen and pelvis and PE study. ? ?On my exam, patient appears quite uncomfortable.  Continues to have abdominal pain.  His cardiac work-up is reassuring as his delta troponin remained unchanged. ? ?CT angio of the chest without evidence of PE.  CT abdomen and pelvis shows likely acute pancreatitis.  His hepatic function unremarkable.  Lipase mildly elevated at 284.  He had a white count of 18,000.  Serum creatinine and BUN elevated, improved from 8 months ago. ?On recheck, pain somewhat improved after IV hydromorphone.  No active vomiting. ? ?Patient will need hospital admission for his his acute pancreatitis. ? ?Discussed findings with [facility] hospitalist, Dr. Myna Hidalgo who agrees to admit ? ? ? ?DG Chest 2 View ? ?Result Date: 02/13/2022 ?CLINICAL DATA:  Chest pain. EXAM: CHEST - 2 VIEW COMPARISON:  Chest x-ray 05/22/2021 FINDINGS: The heart size and mediastinal contours are within normal limits. Both lungs are clear. The visualized skeletal structures are unremarkable. IMPRESSION: No active cardiopulmonary disease. Electronically Signed   By: Ronney Asters M.D.   On: 02/13/2022 17:39  ? ?CT Angio Chest PE W and/or Wo Contrast ? ?Result Date: 02/13/2022 ?CLINICAL DATA:  Pulmonary embolism (PE) suspected, high prob. Upper abdominal pain, chest pain. Low O2 sats. EXAM: CT ANGIOGRAPHY CHEST WITH CONTRAST TECHNIQUE: Multidetector CT imaging of the chest was performed using the standard protocol during bolus administration of intravenous contrast. Multiplanar CT image reconstructions and MIPs were obtained to evaluate the vascular anatomy. RADIATION DOSE REDUCTION: This exam was performed according to the  departmental dose-optimization program which includes automated exposure control, adjustment of the mA and/or kV according to patient size and/or use of iterative reconstruction technique. CONTRAST:  147m OMNIPAQUE IOHEXOL 350 MG/ML SOLN COMPARISON:  05/22/2021 FINDINGS: Cardiovascular: No filling defects in the pulmonary arteries to suggest pulmonary emboli. Coronary artery and aortic calcifications. Heart is normal size. Aorta is normal caliber. Mediastinum/Nodes: No mediastinal, hilar, or axillary adenopathy. Trachea and esophagus are unremarkable. Thyroid unremarkable. Lungs/Pleura: Dependent atelectasis. No additional confluent opacities or effusions. Upper Abdomen: See abdominal CT report Musculoskeletal: Chest wall soft tissues are unremarkable. No acute bony abnormality. Review of the MIP images confirms the above findings. IMPRESSION: No evidence of pulmonary embolus. Coronary artery disease. No acute cardiopulmonary disease. Aortic Atherosclerosis (ICD10-I70.0). Electronically Signed   By: KRolm BaptiseM.D.   On: 02/13/2022 19:37  ? ?CT ABDOMEN PELVIS W CONTRAST ? ?Result Date: 02/13/2022 ?CLINICAL DATA:  Upper abdominal pain, chest pain EXAM: CT ABDOMEN AND PELVIS WITH CONTRAST TECHNIQUE: Multidetector CT imaging of the abdomen and pelvis was performed using the standard protocol following bolus administration of intravenous contrast. RADIATION DOSE REDUCTION: This exam was performed according to the departmental dose-optimization program which includes automated exposure control, adjustment of the mA and/or kV according to patient size and/or use of iterative reconstruction technique. CONTRAST:  1042mOMNIPAQUE IOHEXOL 350 MG/ML SOLN COMPARISON:  05/22/2021 FINDINGS: Lower chest: See chest CT report Hepatobiliary: Diffuse low-density throughout the liver compatible with fatty infiltration. Gallbladder unremarkable. Pancreas: Extensive edema surrounding the pancreas compatible with acute pancreatitis.  Decreased enhancement within the pancreatic tail likely reflects edema. This could reflect an area of pancreatic necrosis. Recommend attention on follow-up imaging.  No ductal dilatation. Spleen: No focal abnormality.  Normal size. Adrenals/Urinary Tract: Small scattered left renal stones. No hydronephrosis. Urinary bladder and adrenal glands unremarkable. Mild atrophy and cortical thinning in the right kidney. Stomach/Bowel: Stomach, large and small bowel grossly unremarkable. Vascular/Lymphatic: Prior aorto bi-iliac bypass. Aneurysmal dilatation of the abdominal aorta above the bypass measuring up to 4.3 cm, stable since prior study. No adenopathy Reproductive: No visible focal abnormality. Other: No free fluid or free air. Musculoskeletal: No acute bony abnormality. IMPRESSION: Changes of acute pancreatitis with edema/stranding surrounding the pancreas and tracking in the left anterior pararenal space. Decreased enhancement within the pancreatic tail could reflect edema or pancreatic necrosis. Recommend attention on follow-up imaging. Diffuse fatty infiltration of the liver. 4.3 cm abdominal aortic aneurysm.  Prior aorto bi-iliac bypass. Electronically Signed   By: Rolm Baptise M.D.   On: 02/13/2022 19:42   ? ?  ?Kem Parkinson, PA-C ?02/13/22 2206 ? ?  ?Godfrey Pick, MD ?02/15/22 [phone] ? ?

## 2022-02-13 NOTE — ED Notes (Signed)
ED PA at [facility] 

## 2022-02-14 ENCOUNTER — Encounter (HOSPITAL_COMMUNITY): Payer: Self-pay | Admitting: Family Medicine

## 2022-02-14 ENCOUNTER — Inpatient Hospital Stay (HOSPITAL_COMMUNITY): Payer: Medicare Other

## 2022-02-14 ENCOUNTER — Ambulatory Visit: Payer: Medicare Other | Admitting: Gastroenterology

## 2022-02-14 DIAGNOSIS — N1832 Chronic kidney disease, stage 3b: Secondary | ICD-10-CM

## 2022-02-14 DIAGNOSIS — K8500 Idiopathic acute pancreatitis without necrosis or infection: Secondary | ICD-10-CM

## 2022-02-14 DIAGNOSIS — E1165 Type 2 diabetes mellitus with hyperglycemia: Secondary | ICD-10-CM | POA: Diagnosis not present

## 2022-02-14 DIAGNOSIS — N189 Chronic kidney disease, unspecified: Secondary | ICD-10-CM

## 2022-02-14 DIAGNOSIS — K858 Other acute pancreatitis without necrosis or infection: Secondary | ICD-10-CM | POA: Diagnosis not present

## 2022-02-14 DIAGNOSIS — E782 Mixed hyperlipidemia: Secondary | ICD-10-CM

## 2022-02-14 DIAGNOSIS — E785 Hyperlipidemia, unspecified: Secondary | ICD-10-CM

## 2022-02-14 DIAGNOSIS — E1159 Type 2 diabetes mellitus with other circulatory complications: Secondary | ICD-10-CM

## 2022-02-14 LAB — CBC
HCT: 47.1 % (ref 39.0–52.0)
Hemoglobin: 15.4 g/dL (ref 13.0–17.0)
MCH: 29.3 pg (ref 26.0–34.0)
MCHC: 32.7 g/dL (ref 30.0–36.0)
MCV: 89.7 fL (ref 80.0–100.0)
Platelets: 143 10*3/uL — ABNORMAL LOW (ref 150–400)
RBC: 5.25 MIL/uL (ref 4.22–5.81)
RDW: 14.9 % (ref 11.5–15.5)
WBC: 14.3 10*3/uL — ABNORMAL HIGH (ref 4.0–10.5)
nRBC: 0 % (ref 0.0–0.2)

## 2022-02-14 LAB — COMPREHENSIVE METABOLIC PANEL
ALT: 19 U/L (ref 0–44)
AST: 17 U/L (ref 15–41)
Albumin: 3.4 g/dL — ABNORMAL LOW (ref 3.5–5.0)
Alkaline Phosphatase: 62 U/L (ref 38–126)
Anion gap: 9 (ref 5–15)
BUN: 22 mg/dL — ABNORMAL HIGH (ref 6–20)
CO2: 22 mmol/L (ref 22–32)
Calcium: 8.5 mg/dL — ABNORMAL LOW (ref 8.9–10.3)
Chloride: 105 mmol/L (ref 98–111)
Creatinine, Ser: 1.51 mg/dL — ABNORMAL HIGH (ref 0.61–1.24)
GFR, Estimated: 53 mL/min — ABNORMAL LOW (ref >60)
Glucose, Bld: 241 mg/dL — ABNORMAL HIGH (ref 70–99)
Potassium: 4.8 mmol/L (ref 3.5–5.1)
Sodium: 136 mmol/L (ref 135–145)
Total Bilirubin: 0.6 mg/dL (ref 0.3–1.2)
Total Protein: 6.4 g/dL — ABNORMAL LOW (ref 6.5–8.1)

## 2022-02-14 LAB — GLUCOSE, CAPILLARY
Glucose-Capillary: 268 mg/dL — ABNORMAL HIGH (ref 70–99)
Glucose-Capillary: 259 mg/dL — ABNORMAL HIGH (ref 70–99)
Glucose-Capillary: 218 mg/dL — ABNORMAL HIGH (ref 70–99)
Glucose-Capillary: 228 mg/dL — ABNORMAL HIGH (ref 70–99)
Glucose-Capillary: 219 mg/dL — ABNORMAL HIGH (ref 70–99)

## 2022-02-14 LAB — HIV ANTIBODY (ROUTINE TESTING W REFLEX): HIV Screen 4th Generation wRfx: NONREACTIVE

## 2022-02-14 LAB — HEMOGLOBIN A1C
Hgb A1c MFr Bld: 8 % — ABNORMAL HIGH (ref 4.8–5.6)
Mean Plasma Glucose: 182.9 mg/dL

## 2022-02-14 LAB — PHOSPHORUS: Phosphorus: 3.1 mg/dL (ref 2.5–4.6)

## 2022-02-14 LAB — MAGNESIUM: Magnesium: 1.7 mg/dL (ref 1.7–2.4)

## 2022-02-14 MED ORDER — LACTATED RINGERS IV SOLN: INTRAVENOUS | Status: AC

## 2022-02-14 MED ORDER — ROSUVASTATIN CALCIUM 20 MG PO TABS
40.0000 mg | ORAL_TABLET | Freq: Every day | ORAL | Status: DC
  Administered 2022-02-14 – 2022-02-17 (×4): 40 mg via ORAL
  Filled 2022-02-14 (×4): qty 2

## 2022-02-14 MED ORDER — SODIUM CHLORIDE 0.9 % IV SOLN: INTRAVENOUS | Status: DC

## 2022-02-14 MED ORDER — FENOFIBRATE 160 MG PO TABS
160.0000 mg | ORAL_TABLET | Freq: Every day | ORAL | Status: DC
  Administered 2022-02-14 – 2022-02-17 (×4): 160 mg via ORAL
  Filled 2022-02-14 (×4): qty 1

## 2022-02-14 NOTE — Assessment & Plan Note (Addendum)
-   02/14/2022 hemoglobin A1c 8% ?- NovoLog sliding scale and semglee in hospital ?- continue home regimen at discharge and discuss with primary regarding starting on insulin vs aggressive lifestyle changes attempt first in addition to above meds  ?

## 2022-02-14 NOTE — Assessment & Plan Note (Signed)
Baseline creatinine 1.5-1.8 ?Presented with serum creatinine 1.59 ?

## 2022-02-14 NOTE — Progress Notes (Signed)
Inpatient Diabetes Program Recommendations ? ?AACE/ADA: New Consensus Statement on Inpatient Glycemic Control (2015) ? ?Target Ranges:  Prepandial:   less than 140 mg/dL ?     Peak postprandial:   less than 180 mg/dL (1-2 hours) ?     Critically ill patients:  140 - 180 mg/dL  ? ?Lab Results  ?Component Value Date  ? GLUCAP 218 (H) 02/14/2022  ? HGBA1C 8.0 (H) 02/14/2022  ? ? ?Review of Glycemic Control ? Latest Reference Range & Units 02/13/22 22:52 02/14/22 04:18 02/14/22 07:28 02/14/22 11:33  ?Glucose-Capillary 70 - 99 mg/dL 250 (H) 268 (H) 259 (H) 218 (H)  ?(H): Data is abnormally high ?Diabetes history: Type 2 Dm ?Outpatient Diabetes medications: Amaryl 4 mg BID, Metformin 1000 mg BID ?Current orders for Inpatient glycemic control: Novolog 0-6 units Q4H ? ?Inpatient Diabetes Program Recommendations:   ? ?Consider adding Levemir 12 units QHS.  ? ?Thanks, ?Bronson Curb, MSN, RNC-OB ?Diabetes Coordinator ?[phone] (8a-5p) ? ? ? ? ?

## 2022-02-14 NOTE — Assessment & Plan Note (Addendum)
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  Triglycerides 709 on admission (some incidence rates seen in patients with TG <~850). Other workup pending for etiology per GI as well; follow up IgG 4 level ?- outpatient CT pancreatic protocol in 10-12 weeks recommended per GI ?- I agree, his age is higher than would be expected for primary hyperTG, but given his obesity and DMII not unreasonable to have a component of secondary hyperTG contributing to this presentation ?- Continue fluids ?- Tolerated CLD and advanced to FLD and soft diet; if tolerates, will d/c home ?

## 2022-02-14 NOTE — Progress Notes (Signed)
$'@LOGO's$ @ ? ? ?Referring Provider: Dr. Carles Collet ?Primary Care Physician:  [facility] ?Primary Gastroenterologist:  Dr. Abbey Chatters ? ?Date of Admission: 02/13/2022 ?Date of Consultation: 02/14/2022 ? ?Reason for Consultation: Pancreatitis with possible necrosis. ? ?HPI:  ?Clayton Peterson is a 61 y.o. year old male with history of HTN, type 2 diabetes, HLD, AAA s/p aortobiiliac, bypass adenomatous colon polyps, gastritis, and duodenal ulcer in the setting of NSAIDs in August 2022, who presented to the emergency room the evening of 4/24 with epigastric pain with associated nausea with vomiting x 2 days. ? ?ED Course:  ?Tachycardic into the 120s, mildly hypertensive, temperature 99.1 ?F, O2 saturation as low as 90% on room air. ?Laboratory valuation with white blood cell count elevated at 18.9, hemoglobin elevated at 17.6, platelets within normal limits, LFTs within normal limits, calcium wnl, Cr elevated at 1.59 (improved), lipase elevated at 284. ?Troponins flat. ?Triglycerides 709 ? ?CTA chest was negative for PE. ?CT A/P with contrast with extensive edema surrounding the pancreas consistent with pancreatitis.  There was an area of decreased enhancement in the pancreatic tail consistent with pancreatic edema versus necrosis. ? ?He was started on IV fluids, opioids for pain control, and Zofran. ? ? ?Consult:  ?Acute onset epigastric abdominal starting Saturday with nausea and a single episode of vomiting. Pain is worsened by eating, moving, and coughing. Also with  a little left lower chest pain that is constant. Worse with movement. No palpitations. Little SOB which is new since epigastric pain started.  Chronic intermittent cough in the setting of chronic tobacco use.  Noted slight fever with temp around 99 ?F at home.  Denies recent cold or flulike symptoms. ? ?Today, his abdominal pain is a little better, but still rating it 8/10 in severity.  Had a little nausea today, but nothing significant and no vomiting.  He  is tolerating ice chips and water well, but not ready to advance to clear liquid diet. ? ? ?New medications: No ?Alcohol: Very seldom. Last time was 6 months ago.  ?Personal or Fhx of pancreatitis: None ?Smokes cigarettes 1+ packs a day.  ?No illicit drug use. ? ?Had discontinued NSAIDs aside from 81 mg aspirin daily. Taking omeprazole BID. No GERD symptoms.  No bowel habit changes, brbpr, or melena.  ? ? ? ?EGD 06/03/2021: Gastritis biopsied, nonobstructing, nonbleeding duodenal ulcer with no stigmata of bleeding, NSAID induced etiology.  Pathology with chronic gastritis, negative for H. pylori.  Recommended Prilosec 20 mg twice daily, Carafate 1 g 4 times daily. ? ?Colonoscopy 06/03/2021: Nonbleeding internal hemorrhoids, 6 mm polyp in sigmoid colon, 2 mm polyp in the ascending colon, congested and erythematous mucosa in the cecum biopsied.  Pathology revealed 2 tubular adenomas, cecal biopsy with polypoid colonic mucosa with ectatic vascular channels with small vascular malformation not ruled out, negative for dysplasia or malignancy.  Repeat in 5 years. ? ? ?Past Medical History:  ?Diagnosis Date  ? Coronary artery disease   ? Diabetes mellitus without complication ([place])   ? Heart attack ([place])   ? High cholesterol   ? Hypertension   ? ? ?Past Surgical History:  ?Procedure Laterality Date  ? ABDOMINAL AORTIC ANEURYSM REPAIR    ? BACK SURGERY    ? x2  ? BIOPSY  06/03/2021  ? Procedure: BIOPSY;  Surgeon: Eloise Harman, DO;  [facility]: [facility];  Service: Endoscopy;;  ? CARDIAC CATHETERIZATION    ? stent placed on right side ofo heart  ? COLONOSCOPY WITH PROPOFOL  N/A 06/03/2021  ? Procedure: COLONOSCOPY WITH PROPOFOL;  Surgeon: Eloise Harman, DO;  Location: [facility];  Service: Endoscopy;  Laterality: N/A;  10:00am  ? ESOPHAGOGASTRODUODENOSCOPY (EGD) WITH PROPOFOL N/A 06/03/2021  ? Procedure: ESOPHAGOGASTRODUODENOSCOPY (EGD) WITH PROPOFOL;  Surgeon: Eloise Harman, DO;  Location: [facility];   Service: Endoscopy;  Laterality: N/A;  ? KNEE SURGERY Left   ? PARTIAL COLECTOMY    ? POLYPECTOMY  06/03/2021  ? Procedure: POLYPECTOMY INTESTINAL;  Surgeon: Eloise Harman, DO;  Location: [facility];  Service: Endoscopy;;  ? ? ?Prior to Admission medications   ?Medication Sig Start Date End Date Taking? Authorizing Provider  ?acetaminophen (TYLENOL) 325 MG tablet Take 325 mg by mouth every 4 (four) hours as needed for pain. 11/16/19   [provider]  ?aspirin 81 MG EC tablet Take 81 mg by mouth daily.    [provider]  ?Cholecalciferol (VITAMIN D3) 125 MCG (5000 UT) TABS Take by mouth daily.    [provider]  ?fenofibrate (TRICOR) 145 MG tablet Take 145 mg by mouth daily. 03/18/21   [provider]  ?gabapentin (NEURONTIN) 100 MG capsule Take 100 mg by mouth daily. 05/05/21   [provider]  ?glimepiride (AMARYL) 4 MG tablet Take 4 mg by mouth 2 (two) times daily. 03/29/21   [provider]  ?lisinopril (ZESTRIL) 20 MG tablet Take 20 mg by mouth daily. 03/29/21   [provider]  ?meloxicam (MOBIC) 15 MG tablet Take 15 mg by mouth daily. 03/29/21   [provider]  ?metFORMIN (GLUCOPHAGE) 1000 MG tablet Take 1,000 mg by mouth 2 (two) times daily. 03/18/21   [provider]  ?metoprolol tartrate (LOPRESSOR) 50 MG tablet Take 50 mg by mouth 2 (two) times daily. 03/18/21   [provider]  ?omeprazole (PRILOSEC) 20 MG capsule Take 1 capsule (20 mg total) by mouth 2 (two) times daily before a meal. 05/25/21   Erenest Rasher, PA-C  ?polyethylene glycol-electrolytes (NULYTELY) 420 g solution As directed 05/26/21   Mahala Menghini, PA-C  ?rosuvastatin (CRESTOR) 40 MG tablet Take 40 mg by mouth at bedtime. 03/29/21   [provider]  ?sucralfate (CARAFATE) 1 GM/10ML suspension Take 10 mLs (1 g total) by mouth 4 (four) times daily -  with meals and at bedtime. 05/22/21   Fredia Sorrow, MD  ?traMADol (ULTRAM) 50 MG tablet Take  50 mg by mouth every 6 (six) hours as needed.    [provider]  ? ? ?Current Facility-Administered Medications  ?Medication Dose Route Frequency Provider Last Rate Last Admin  ? 0.9 %  sodium chloride infusion   Intravenous Continuous Opyd, Ilene Qua, MD 150 mL/hr at 02/13/22 2259 New Bag at 02/13/22 2259  ? 0.9 %  sodium chloride infusion   Intravenous Continuous Tat, Shanon Brow, MD      ? acetaminophen (TYLENOL) tablet 650 mg  650 mg Oral Q6H PRN Opyd, Ilene Qua, MD      ? Or  ? acetaminophen (TYLENOL) suppository 650 mg  650 mg Rectal Q6H PRN Opyd, Ilene Qua, MD      ? enoxaparin (LOVENOX) injection 40 mg  40 mg Subcutaneous Daily Opyd, Ilene Qua, MD   40 mg at 02/14/22 4166  ? fenofibrate tablet 160 mg  160 mg Oral Daily Tat, David, MD      ? HYDROmorphone (DILAUDID) injection 0.5-1 mg  0.5-1 mg Intravenous Q3H PRN Opyd, Ilene Qua, MD   1 mg at 02/14/22  4627  ? insulin aspart (novoLOG) injection 0-6 Units  0-6 Units Subcutaneous Q4H Opyd, Ilene Qua, MD   3 Units at 02/14/22 0815  ? metoprolol tartrate (LOPRESSOR) tablet 50 mg  50 mg Oral BID Opyd, Ilene Qua, MD   50 mg at 02/14/22 0815  ? ondansetron (ZOFRAN) tablet 4 mg  4 mg Oral Q6H PRN Opyd, Ilene Qua, MD      ? Or  ? ondansetron (ZOFRAN) injection 4 mg  4 mg Intravenous Q6H PRN Opyd, Ilene Qua, MD      ? pantoprazole (PROTONIX) injection 40 mg  40 mg Intravenous QHS Opyd, Ilene Qua, MD   40 mg at 02/13/22 2259  ? rosuvastatin (CRESTOR) tablet 40 mg  40 mg Oral Daily Tat, David, MD      ? ? ?Allergies as of 02/13/2022 - Review Complete 02/13/2022  ?Allergen Reaction Noted  ? Bee venom Anaphylaxis 05/22/2021  ? Coconut oil Anaphylaxis 05/22/2021  ? ? ?History reviewed. No pertinent family history. ? ?Social History  ? ?Socioeconomic History  ? Marital status: Married  ?  Spouse name: Not on file  ? Number of children: Not on file  ? Years of education: Not on file  ? Highest education level: Not on file  ?Occupational History  ? Not on file  ?Tobacco  Use  ? Smoking status: Every Day  ?  Packs/day: 1.00  ?  Types: Cigarettes  ?  Passive exposure: Never  ? Smokeless tobacco: Never  ?Vaping Use  ? Vaping Use: Never used  ?Substance and Sexual Activity  ? Alco

## 2022-02-14 NOTE — Assessment & Plan Note (Addendum)
troponins 6>>6 ?CTA chest negative for PE ?Continue aspirin ?

## 2022-02-14 NOTE — Progress Notes (Signed)
?  ?       ?PROGRESS NOTE ? ?Clayton Peterson GYB:[id number] DOB: 1960-12-06 DOA: 02/13/2022 ?PCP: Alanson Puls, [facility] ? ?Brief History:  ?61 year old male with a history of hypertension, diabetes mellitus type 2, hyperlipidemia, duodenal ulcer presenting with epigastric pain that started on the evening of 02/11/2022 after eating dinner.  He has had some nausea without emesis.  He has had some subjective fevers and chills.  He denies any NSAIDs recently.  He states that his abdominal pain had worsened the last 2 days resulting in his ED visit.  He has had some substernal chest pain and left-sided chest pain that started around the same time.  He denies any worsening shortness of breath, coughing, hemoptysis.  He states that his chest pain is off and on worsening with any movement.  His last bowel movement was in the evening of 02/11/2022. ?In the ED, the patient had a low-grade temperature of 99.1 ?F.  He was tachycardic into the 120s.  He was hemodynamically stable with oxygen saturation 93% on room air.  CTA chest was negative for pulmonary embolus.  CT abdomen and pelvis showed extensive edema surrounding the pancreas consistent with pancreatitis.  There was an area of decreased enhancement in the pancreatic tail consistent with pancreatic edema versus necrosis.  He is status post aortobiiliac bypass.  The patient was started on IV fluids and opioids for his pancreatitis.  Triglycerides were 709.  EKG showed sinus rhythm and nonspecific T wave changes.  Troponins were 6>> 6  ? ? ? ?Assessment and Plan: ?* Acute pancreatitis ?Due to hypertriglyceridemia--709 ?Start fenofibrate ?-Continue IV fluids ?-Judicious IV opioids ?-N.p.o. except for sips with meds ? ?Mixed hyperlipidemia ?Continue Crestor ?Start fenofibrate ? ?Uncontrolled type 2 diabetes mellitus with hyperglycemia, without long-term current use of insulin ([place]) ?Holding metformin and glimepiride ?02/14/2022 hemoglobin A1c 8.0 ?NovoLog sliding  scale ? ?Chronic kidney disease, stage 3b ([place]) ?Baseline creatinine 1.5-1.8 ?Presented with serum creatinine 1.59 ? ?Coronary artery disease ?troponins 6>>6 ?Personally reviewed EKG--sinus rhythm, nonspecific T wave change ?CTA chest negative for PE ?Continue aspirin ? ?Hypertension ?Continue metoprolol tartrate ? ? ? ? ? ? ?Family Communication:   no Family at bedside ? ?Consultants:  none ? ?Code Status:  FULL / ? ?DVT Prophylaxis:  [place] Lovenox ? ? ?Procedures: ?As Listed in Progress Note Above ? ?Antibiotics: ?None ? ? ? ? ?Subjective: ?Patient complains of abdominal pain, not much better.  Denies any vomiting.  Denies any shortness of breath.  He has some substernal chest pain.  Denies any fever, chills, headache, neck pain.  There is no hematochezia or melena. ? ?Objective: ?Vitals:  ? 02/13/22 2250 02/14/22 0154 02/14/22 0500 02/14/22 0550  ?BP: 136/83 122/82  118/85  ?Pulse: (!) 132 98  89  ?Resp: '20 20  20  '$ ?Temp: 97.8 ?F (36.6 ?C) 98.2 ?F (36.8 ?C)  (!) 97.5 ?F (36.4 ?C)  ?TempSrc: Oral   Oral  ?SpO2: 93% 92%  90%  ?Weight: 100.8 kg  100.8 kg   ?Height: '5\' 9"'$  (1.753 m)     ? ? ?Intake/Output Summary (Last 24 hours) at 02/14/2022 0911 ?Last data filed at 02/14/2022 0500 ?Gross per 24 hour  ?Intake 1602.1 ml  ?Output --  ?Net 1602.1 ml  ? ?Weight change:  ?Exam: ? ?General:  Pt is alert, follows commands appropriately, not in acute distress ?HEENT: No icterus, No thrush, No neck mass, [place]/AT ?Cardiovascular: RRR, S1/S2, no rubs, no gallops ?Respiratory: Diminished breath sounds, but CTA  bilaterally, no wheezing, no crackles, no rhonchi ?Abdomen: Soft/+BS, RUQ, epigastric tender, non distended, no guarding ?Extremities: No edema, No lymphangitis, No petechiae, No rashes, no synovitis ? ? ?Data Reviewed: ?I have personally reviewed following labs and imaging studies ?Basic Metabolic Panel: ?Recent Labs  ?Lab 02/13/22 ?1711 02/14/22 ?0442  ?NA 135 136  ?K 4.9 4.8  ?CL 99 105  ?CO2 24 22  ?GLUCOSE 245* 241*  ?BUN  23* 22*  ?CREATININE 1.59* 1.51*  ?CALCIUM 10.1 8.5*  ?MG  --  1.7  ?PHOS  --  3.1  ? ?Liver Function Tests: ?Recent Labs  ?Lab 02/13/22 ?1711 02/14/22 ?0442  ?AST 22 17  ?ALT 28 19  ?ALKPHOS 79 62  ?BILITOT 0.8 0.6  ?PROT 7.9 6.4*  ?ALBUMIN 4.3 3.4*  ? ?Recent Labs  ?Lab 02/13/22 ?1711  ?LIPASE 284*  ? ?No results for input(s): AMMONIA in the last 168 hours. ?Coagulation Profile: ?No results for input(s): INR, PROTIME in the last 168 hours. ?CBC: ?Recent Labs  ?Lab 02/13/22 ?1711 02/14/22 ?0442  ?WBC 18.0*  18.9* 14.3*  ?NEUTROABS 15.4*  --   ?HGB 17.5*  17.6* 15.4  ?HCT 52.4*  51.6 47.1  ?MCV 88.2  88.7 89.7  ?PLT 196  196 143*  ? ?Cardiac Enzymes: ?No results for input(s): CKTOTAL, CKMB, CKMBINDEX, TROPONINI in the last 168 hours. ?BNP: ?Invalid input(s): POCBNP ?CBG: ?Recent Labs  ?Lab 02/13/22 ?2252 02/14/22 ?0418 02/14/22 ?6962  ?GLUCAP 250* 268* 259*  ? ?HbA1C: ?Recent Labs  ?  02/14/22 ?0442  ?HGBA1C 8.0*  ? ?Urine analysis: ?No results found for: COLORURINE, APPEARANCEUR, [place], [place], [place], [place], [place], KETONESUR, PROTEINUR, [place], NITRITE, LEUKOCYTESUR ?Sepsis Labs: ?'@LABRCNTIP'$ (procalcitonin:4,lacticidven:4) ?)No results found for this or any previous visit (from the past 240 hour(s)).  ? ?Scheduled Meds: ? enoxaparin (LOVENOX) injection  40 mg Subcutaneous Daily  ? insulin aspart  0-6 Units Subcutaneous Q4H  ? metoprolol tartrate  50 mg Oral BID  ? pantoprazole (PROTONIX) IV  40 mg Intravenous QHS  ? ?Continuous Infusions: ? sodium chloride 150 mL/hr at 02/13/22 2259  ? ? ?Procedures/Studies: ?DG Chest 2 View ? ?Result Date: 02/13/2022 ?CLINICAL DATA:  Chest pain. EXAM: CHEST - 2 VIEW COMPARISON:  Chest x-ray 05/22/2021 FINDINGS: The heart size and mediastinal contours are within normal limits. Both lungs are clear. The visualized skeletal structures are unremarkable. IMPRESSION: No active cardiopulmonary disease. Electronically Signed   By: Ronney Asters M.D.   On: 02/13/2022  17:39  ? ?CT Angio Chest PE W and/or Wo Contrast ? ?Result Date: 02/13/2022 ?CLINICAL DATA:  Pulmonary embolism (PE) suspected, high prob. Upper abdominal pain, chest pain. Low O2 sats. EXAM: CT ANGIOGRAPHY CHEST WITH CONTRAST TECHNIQUE: Multidetector CT imaging of the chest was performed using the standard protocol during bolus administration of intravenous contrast. Multiplanar CT image reconstructions and MIPs were obtained to evaluate the vascular anatomy. RADIATION DOSE REDUCTION: This exam was performed according to the departmental dose-optimization program which includes automated exposure control, adjustment of the mA and/or kV according to patient size and/or use of iterative reconstruction technique. CONTRAST:  146m OMNIPAQUE IOHEXOL 350 MG/ML SOLN COMPARISON:  05/22/2021 FINDINGS: Cardiovascular: No filling defects in the pulmonary arteries to suggest pulmonary emboli. Coronary artery and aortic calcifications. Heart is normal size. Aorta is normal caliber. Mediastinum/Nodes: No mediastinal, hilar, or axillary adenopathy. Trachea and esophagus are unremarkable. Thyroid unremarkable. Lungs/Pleura: Dependent atelectasis. No additional confluent opacities or effusions. Upper Abdomen: See abdominal CT report Musculoskeletal: Chest wall soft tissues are unremarkable. No acute  bony abnormality. Review of the MIP images confirms the above findings. IMPRESSION: No evidence of pulmonary embolus. Coronary artery disease. No acute cardiopulmonary disease. Aortic Atherosclerosis (ICD10-I70.0). Electronically Signed   By: Rolm Baptise M.D.   On: 02/13/2022 19:37  ? ?CT ABDOMEN PELVIS W CONTRAST ? ?Result Date: 02/13/2022 ?CLINICAL DATA:  Upper abdominal pain, chest pain EXAM: CT ABDOMEN AND PELVIS WITH CONTRAST TECHNIQUE: Multidetector CT imaging of the abdomen and pelvis was performed using the standard protocol following bolus administration of intravenous contrast. RADIATION DOSE REDUCTION: This exam was performed  according to the departmental dose-optimization program which includes automated exposure control, adjustment of the mA and/or kV according to patient size and/or use of iterative reconstruction technique. CONTRAST:  156m

## 2022-02-14 NOTE — Assessment & Plan Note (Addendum)
-   Initial TG 709; no insulin drip started however repeat TG improved to 420 after admission ?-Continue Crestor and fenofibrate ?- Added omega ?- outpatient referral to lipid clinic ?

## 2022-02-14 NOTE — Assessment & Plan Note (Addendum)
Continue metoprolol tartrate 

## 2022-02-14 NOTE — TOC Progression Note (Signed)
?  Transition of Care (TOC) Screening Note ? ? ?Patient Details  ?Name: Clayton Peterson ?Date of Birth: 1961/05/04 ? ? ?Transition of Care (TOC) CM/SW Contact:    ?Boneta Lucks, RN ?Phone Number: ?02/14/2022, 9:57 AM ? ? ? ?Transition of Care Department [facility]) has reviewed patient and no TOC needs have been identified at this time. We will continue to monitor patient advancement through interdisciplinary progression rounds. If new patient transition needs arise, please place a TOC consult. ? ? ?Expected Discharge Plan: Home/Self Care ?Barriers to Discharge: Continued Medical Work up ? ?Expected Discharge Plan and Services ?Expected Discharge Plan: Home/Self Care ?  ?  ?  ?  ?                ?  ?  ?  ?  ?  ?  ?  ?  ?  ?  ? ? ?

## 2022-02-14 NOTE — Consult Note (Signed)
Referring Provider: Dr. Carles Collet ?Primary Care Physician:  [facility] ?Primary Gastroenterologist:  Dr. Abbey Chatters ?  ?Date of Admission: 02/13/2022 ?Date of Consultation: 02/14/2022 ?  ?Reason for Consultation: Pancreatitis with possible necrosis. ?  ?HPI:  ?Clayton Peterson is a 61 y.o. year old male with history of HTN, type 2 diabetes, HLD, AAA s/p aortobiiliac, bypass adenomatous colon polyps, gastritis, and duodenal ulcer in the setting of NSAIDs in August 2022, who presented to the emergency room the evening of 4/24 with epigastric pain with associated nausea with vomiting x 2 days. ?  ?ED Course:  ?Tachycardic into the 120s, mildly hypertensive, temperature 99.1 ?F, O2 saturation as low as 90% on room air. ?Laboratory valuation with white blood cell count elevated at 18.9, hemoglobin elevated at 17.6, platelets within normal limits, LFTs within normal limits, calcium wnl, Cr elevated at 1.59 (improved), lipase elevated at 284. ?Troponins flat. ?Triglycerides 709 ?  ?CTA chest was negative for PE. ?CT A/P with contrast with extensive edema surrounding the pancreas consistent with pancreatitis.  There was an area of decreased enhancement in the pancreatic tail consistent with pancreatic edema versus necrosis. ?  ?He was started on IV fluids, opioids for pain control, and Zofran. ?  ?  ?Consult:  ?Acute onset epigastric abdominal starting Saturday with nausea and a single episode of vomiting. Pain is worsened by eating, moving, and coughing. Also with  a little left lower chest pain that is constant. Worse with movement. No palpitations. Little SOB which is new since epigastric pain started.  Chronic intermittent cough in the setting of chronic tobacco use.  Noted slight fever with temp around 99 ?F at home.  Denies recent cold or flulike symptoms. ?  ?Today, his abdominal pain is a little better, but still rating it 8/10 in severity.  Had a little nausea today, but nothing significant and no vomiting.  He is  tolerating ice chips and water well, but not ready to advance to clear liquid diet. ?  ?  ?New medications: No ?Alcohol: Very seldom. Last time was 6 months ago.  ?Personal or Fhx of pancreatitis: None ?Smokes cigarettes 1+ packs a day.  ?No illicit drug use. ?  ?Had discontinued NSAIDs aside from 81 mg aspirin daily. Taking omeprazole BID. No GERD symptoms.  No bowel habit changes, brbpr, or melena.  ?  ?  ?  ?EGD 06/03/2021: Gastritis biopsied, nonobstructing, nonbleeding duodenal ulcer with no stigmata of bleeding, NSAID induced etiology.  Pathology with chronic gastritis, negative for H. pylori.  Recommended Prilosec 20 mg twice daily, Carafate 1 g 4 times daily. ?  ?Colonoscopy 06/03/2021: Nonbleeding internal hemorrhoids, 6 mm polyp in sigmoid colon, 2 mm polyp in the ascending colon, congested and erythematous mucosa in the cecum biopsied.  Pathology revealed 2 tubular adenomas, cecal biopsy with polypoid colonic mucosa with ectatic vascular channels with small vascular malformation not ruled out, negative for dysplasia or malignancy.  Repeat in 5 years. ?  ?  ?    ?Past Medical History:  ?Diagnosis Date  ? Coronary artery disease    ? Diabetes mellitus without complication ([place])    ? Heart attack ([place])    ? High cholesterol    ? Hypertension    ?  ?  ?     ?Past Surgical History:  ?Procedure Laterality Date  ? ABDOMINAL AORTIC ANEURYSM REPAIR      ? BACK SURGERY      ?  x2  ? BIOPSY   06/03/2021  ?  Procedure: BIOPSY;  Surgeon: Eloise Harman, DO;  [facility]: [facility];  Service: Endoscopy;;  ? CARDIAC CATHETERIZATION      ?  stent placed on right side ofo heart  ? COLONOSCOPY WITH PROPOFOL N/A 06/03/2021  ?  Procedure: COLONOSCOPY WITH PROPOFOL;  Surgeon: Eloise Harman, DO;  Location: [facility];  Service: Endoscopy;  Laterality: N/A;  10:00am  ? ESOPHAGOGASTRODUODENOSCOPY (EGD) WITH PROPOFOL N/A 06/03/2021  ?  Procedure: ESOPHAGOGASTRODUODENOSCOPY (EGD) WITH PROPOFOL;  Surgeon: Eloise Harman,  DO;  Location: [facility];  Service: Endoscopy;  Laterality: N/A;  ? KNEE SURGERY Left    ? PARTIAL COLECTOMY      ? POLYPECTOMY   06/03/2021  ?  Procedure: POLYPECTOMY INTESTINAL;  Surgeon: Eloise Harman, DO;  Location: [facility];  Service: Endoscopy;;  ?  ?  ?       ?Prior to Admission medications   ?Medication Sig Start Date End Date Taking? Authorizing Provider  ?acetaminophen (TYLENOL) 325 MG tablet Take 325 mg by mouth every 4 (four) hours as needed for pain. 11/16/19     [provider]  ?aspirin 81 MG EC tablet Take 81 mg by mouth daily.       [provider]  ?Cholecalciferol (VITAMIN D3) 125 MCG (5000 UT) TABS Take by mouth daily.       [provider]  ?fenofibrate (TRICOR) 145 MG tablet Take 145 mg by mouth daily. 03/18/21     [provider]  ?gabapentin (NEURONTIN) 100 MG capsule Take 100 mg by mouth daily. 05/05/21     [provider]  ?glimepiride (AMARYL) 4 MG tablet Take 4 mg by mouth 2 (two) times daily. 03/29/21     [provider]  ?lisinopril (ZESTRIL) 20 MG tablet Take 20 mg by mouth daily. 03/29/21     [provider]  ?meloxicam (MOBIC) 15 MG tablet Take 15 mg by mouth daily. 03/29/21     [provider]  ?metFORMIN (GLUCOPHAGE) 1000 MG tablet Take 1,000 mg by mouth 2 (two) times daily. 03/18/21     [provider]  ?metoprolol tartrate (LOPRESSOR) 50 MG tablet Take 50 mg by mouth 2 (two) times daily. 03/18/21     [provider]  ?omeprazole (PRILOSEC) 20 MG capsule Take 1 capsule (20 mg total) by mouth 2 (two) times daily before a meal. 05/25/21     Erenest Rasher, PA-C  ?polyethylene glycol-electrolytes (NULYTELY) 420 g solution As directed 05/26/21     Mahala Menghini, PA-C  ?rosuvastatin (CRESTOR) 40 MG tablet Take 40 mg by mouth at bedtime. 03/29/21     [provider]  ?sucralfate (CARAFATE) 1 GM/10ML suspension Take 10 mLs (1 g total) by mouth 4 (four) times daily -  with meals and at  bedtime. 05/22/21     Fredia Sorrow, MD  ?traMADol (ULTRAM) 50 MG tablet Take 50 mg by mouth every 6 (six) hours as needed.       [provider]  ?  ?  ?         ?Current Facility-Administered Medications  ?Medication Dose Route Frequency Provider Last Rate Last Admin  ? 0.9 %  sodium chloride infusion   Intravenous Continuous Opyd, Ilene Qua, MD 150 mL/hr at 02/13/22 2259 New Bag at 02/13/22 2259  ? 0.9 %  sodium chloride infusion   Intravenous Continuous Tat, David, MD      ? acetaminophen (TYLENOL) tablet 650 mg  650 mg  Oral Q6H PRN Opyd, Ilene Qua, MD      ?  Or  ? acetaminophen (TYLENOL) suppository 650 mg  650 mg Rectal Q6H PRN Opyd, Ilene Qua, MD      ? enoxaparin (LOVENOX) injection 40 mg  40 mg Subcutaneous Daily Opyd, Ilene Qua, MD   40 mg at 02/14/22 5621  ? fenofibrate tablet 160 mg  160 mg Oral Daily Tat, David, MD      ? HYDROmorphone (DILAUDID) injection 0.5-1 mg  0.5-1 mg Intravenous Q3H PRN Opyd, Ilene Qua, MD   1 mg at 02/14/22 3086  ? insulin aspart (novoLOG) injection 0-6 Units  0-6 Units Subcutaneous Q4H Opyd, Ilene Qua, MD   3 Units at 02/14/22 0815  ? metoprolol tartrate (LOPRESSOR) tablet 50 mg  50 mg Oral BID Opyd, Ilene Qua, MD   50 mg at 02/14/22 0815  ? ondansetron (ZOFRAN) tablet 4 mg  4 mg Oral Q6H PRN Opyd, Ilene Qua, MD      ?  Or  ? ondansetron (ZOFRAN) injection 4 mg  4 mg Intravenous Q6H PRN Opyd, Ilene Qua, MD      ? pantoprazole (PROTONIX) injection 40 mg  40 mg Intravenous QHS Opyd, Ilene Qua, MD   40 mg at 02/13/22 2259  ? rosuvastatin (CRESTOR) tablet 40 mg  40 mg Oral Daily Tat, David, MD      ?  ?  ?     ?Allergies as of 02/13/2022 - Review Complete 02/13/2022  ?Allergen Reaction Noted  ? Bee venom Anaphylaxis 05/22/2021  ? Coconut oil Anaphylaxis 05/22/2021  ?  ?  ?History reviewed. No pertinent family history. ?  ?Social History  ?  ?     ?Socioeconomic History  ? Marital status: Married  ?    Spouse name: Not on file  ? Number of children: Not on file  ? Years  of education: Not on file  ? Highest education level: Not on file  ?Occupational History  ? Not on file  ?Tobacco Use  ? Smoking status: Every Day  ?    Packs/day: 1.00  ?    Types: Cigarettes  ?    [place]

## 2022-02-14 NOTE — Hospital Course (Addendum)
Mr. Gappa is a 61 year old male with PMH HTN, DMII, HLD, duodenal ulcer who presented with epigastric pain that started on the evening of 02/11/2022 after eating dinner.  He had some nausea without emesis and some subjective fevers and chills.  He denies any NSAIDs recently.  He states that his abdominal pain had worsened the last 2 days resulting in his ED visit.  He has had some substernal chest pain and left-sided chest pain that started around the same time.  He denies any worsening shortness of breath, coughing, hemoptysis.  He states that his chest pain is off and on worsening with any movement.  His last bowel movement was in the evening of 02/11/2022. ?In the ED, the patient had a low-grade temperature of 99.1 ?F.  He was tachycardic into the 120s.  He was hemodynamically stable with oxygen saturation 93% on room air.  CTA chest was negative for pulmonary embolus.  CT abdomen and pelvis showed extensive edema surrounding the pancreas consistent with pancreatitis.  There was an area of decreased enhancement in the pancreatic tail consistent with pancreatic edema versus necrosis.  He is status post aortobiiliac bypass.  The patient was started on IV fluids and opioids for his pancreatitis.  Triglycerides were 709.  EKG showed sinus rhythm and nonspecific T wave changes.  Troponins were 6>> 6. ?His pain slowly improved during hospitalization and he was started on a diet. ?

## 2022-02-15 DIAGNOSIS — E782 Mixed hyperlipidemia: Secondary | ICD-10-CM

## 2022-02-15 DIAGNOSIS — K858 Other acute pancreatitis without necrosis or infection: Secondary | ICD-10-CM | POA: Diagnosis not present

## 2022-02-15 DIAGNOSIS — E1165 Type 2 diabetes mellitus with hyperglycemia: Secondary | ICD-10-CM | POA: Diagnosis not present

## 2022-02-15 LAB — GLUCOSE, CAPILLARY
Glucose-Capillary: 140 mg/dL — ABNORMAL HIGH (ref 70–99)
Glucose-Capillary: 159 mg/dL — ABNORMAL HIGH (ref 70–99)
Glucose-Capillary: 182 mg/dL — ABNORMAL HIGH (ref 70–99)
Glucose-Capillary: 190 mg/dL — ABNORMAL HIGH (ref 70–99)
Glucose-Capillary: 197 mg/dL — ABNORMAL HIGH (ref 70–99)
Glucose-Capillary: 202 mg/dL — ABNORMAL HIGH (ref 70–99)
Glucose-Capillary: 209 mg/dL — ABNORMAL HIGH (ref 70–99)

## 2022-02-15 LAB — COMPREHENSIVE METABOLIC PANEL
ALT: 14 U/L (ref 0–44)
AST: 14 U/L — ABNORMAL LOW (ref 15–41)
Albumin: 2.9 g/dL — ABNORMAL LOW (ref 3.5–5.0)
Alkaline Phosphatase: 52 U/L (ref 38–126)
Anion gap: 7 (ref 5–15)
BUN: 20 mg/dL (ref 6–20)
CO2: 24 mmol/L (ref 22–32)
Calcium: 8.3 mg/dL — ABNORMAL LOW (ref 8.9–10.3)
Chloride: 102 mmol/L (ref 98–111)
Creatinine, Ser: 1.48 mg/dL — ABNORMAL HIGH (ref 0.61–1.24)
GFR, Estimated: 54 mL/min — ABNORMAL LOW (ref >60)
Glucose, Bld: 192 mg/dL — ABNORMAL HIGH (ref 70–99)
Potassium: 4.3 mmol/L (ref 3.5–5.1)
Sodium: 133 mmol/L — ABNORMAL LOW (ref 135–145)
Total Bilirubin: 0.7 mg/dL (ref 0.3–1.2)
Total Protein: 5.9 g/dL — ABNORMAL LOW (ref 6.5–8.1)

## 2022-02-15 LAB — CBC
HCT: 41.4 % (ref 39.0–52.0)
Hemoglobin: 13.2 g/dL (ref 13.0–17.0)
MCH: 29.1 pg (ref 26.0–34.0)
MCHC: 31.9 g/dL (ref 30.0–36.0)
MCV: 91.2 fL (ref 80.0–100.0)
Platelets: 100 10*3/uL — ABNORMAL LOW (ref 150–400)
RBC: 4.54 MIL/uL (ref 4.22–5.81)
RDW: 14.9 % (ref 11.5–15.5)
WBC: 12.6 10*3/uL — ABNORMAL HIGH (ref 4.0–10.5)
nRBC: 0 % (ref 0.0–0.2)

## 2022-02-15 LAB — LIPID PANEL
Cholesterol: 102 mg/dL (ref 0–200)
HDL: 18 mg/dL — ABNORMAL LOW (ref >40)
LDL Cholesterol: UNDETERMINED mg/dL (ref 0–99)
Total CHOL/HDL Ratio: 5.7 RATIO
Triglycerides: 420 mg/dL — ABNORMAL HIGH (ref <150)
VLDL: UNDETERMINED mg/dL (ref 0–40)

## 2022-02-15 LAB — LDL CHOLESTEROL, DIRECT: Direct LDL: 26 mg/dL (ref 0–99)

## 2022-02-15 MED ORDER — OMEGA-3-ACID ETHYL ESTERS 1 G PO CAPS
2.0000 g | ORAL_CAPSULE | Freq: Two times a day (BID) | ORAL | Status: DC
  Administered 2022-02-15 – 2022-02-17 (×5): 2 g via ORAL
  Filled 2022-02-15 (×5): qty 2

## 2022-02-15 MED ORDER — INSULIN GLARGINE-YFGN 100 UNIT/ML ~~LOC~~ SOLN
5.0000 [IU] | Freq: Every day | SUBCUTANEOUS | Status: DC
  Administered 2022-02-15 – 2022-02-17 (×3): 5 [IU] via SUBCUTANEOUS
  Filled 2022-02-15 (×4): qty 0.05

## 2022-02-15 NOTE — Progress Notes (Signed)
?Progress Note ? ? ? ?[employer]   ?GQB:[id number]  ?DOB: [birth year]  ?DOA: 02/13/2022     2 ?PCP: Alanson Puls, [facility] ? ?Initial CC: abdomina pain, N/V ? ?Hospital Course: ?Mr. Spiewak is a 61 year old male with PMH HTN, DMII, HLD, duodenal ulcer who presented with epigastric pain that started on the evening of 02/11/2022 after eating dinner.  He had some nausea without emesis and some subjective fevers and chills.  He denies any NSAIDs recently.  He states that his abdominal pain had worsened the last 2 days resulting in his ED visit.  He has had some substernal chest pain and left-sided chest pain that started around the same time.  He denies any worsening shortness of breath, coughing, hemoptysis.  He states that his chest pain is off and on worsening with any movement.  His last bowel movement was in the evening of 02/11/2022. ?In the ED, the patient had a low-grade temperature of 99.1 ?F.  He was tachycardic into the 120s.  He was hemodynamically stable with oxygen saturation 93% on room air.  CTA chest was negative for pulmonary embolus.  CT abdomen and pelvis showed extensive edema surrounding the pancreas consistent with pancreatitis.  There was an area of decreased enhancement in the pancreatic tail consistent with pancreatic edema versus necrosis.  He is status post aortobiiliac bypass.  The patient was started on IV fluids and opioids for his pancreatitis.  Triglycerides were 709.  EKG showed sinus rhythm and nonspecific T wave changes.  Troponins were 6>> 6. ?His pain slowly improved during hospitalization and he was started on a diet. ? ?Interval History:  ?No events overnight.  Abdominal pain and nausea has been slowly improving.  Still has some nausea but did not throw up.  Tolerating clear liquids. ? ?Assessment and Plan: ?* Acute pancreatitis ?- Etiology considered due to hypertriglyceridemia.  Triglycerides 709 on admission ?-Suspect some genetic predisposition/family history ?- Continue  fluids ?- Tolerating clear liquids.  Advancement per GI ? ?Mixed hyperlipidemia ?- Initial TG 709; no insulin drip started however repeat TG this am is improved at 420 ?-Continue Crestor and fenofibrate ?- Add omega ?- outpatient referral to lipid clinic ? ?Uncontrolled type 2 diabetes mellitus with hyperglycemia, without long-term current use of insulin ([place]) ?- Holding metformin and glimepiride ?- 02/14/2022 hemoglobin A1c 8% ?- NovoLog sliding scale ?- starting low dose semglee which may also help with TG ? ?Chronic kidney disease, stage 3b ([place]) ?Baseline creatinine 1.5-1.8 ?Presented with serum creatinine 1.59 ? ?Coronary artery disease ?troponins 6>>6 ?CTA chest negative for PE ?Continue aspirin ? ?Hypertension ?Continue metoprolol tartrate ? ? ? ?Old records reviewed in assessment of this patient ? ?Antimicrobials: ? ? ?DVT prophylaxis:  ?enoxaparin (LOVENOX) injection 40 mg Start: 02/14/22 1000 ? ? ?Code Status:   Code Status: Full Code ? ?Disposition Plan: Home in 1 to 2 days ?Status is: Inpatient ? ?Objective: ?Blood pressure 123/77, pulse 85, temperature 98.3 ?F (36.8 ?C), resp. rate 20, height '5\' 9"'$  (1.753 m), weight 101.7 kg, SpO2 93 %.  ?Examination:  ?Physical Exam ?Constitutional:   ?   General: He is not in acute distress. ?   Appearance: Normal appearance.  ?HENT:  ?   Head: Normocephalic and atraumatic.  ?   Mouth/Throat:  ?   Mouth: Mucous membranes are moist.  ?Eyes:  ?   Extraocular Movements: Extraocular movements intact.  ?Cardiovascular:  ?   Rate and Rhythm: Normal rate and regular rhythm.  ?   Heart  sounds: Normal heart sounds.  ?Pulmonary:  ?   Effort: Pulmonary effort is normal. No respiratory distress.  ?   Breath sounds: Normal breath sounds. No wheezing.  ?Abdominal:  ?   General: Bowel sounds are normal. There is no distension.  ?   Palpations: Abdomen is soft.  ?   Comments: Mild nonspecific tenderness throughout with no rebound or guarding  ?Musculoskeletal:     ?   General: Normal  range of motion.  ?   Cervical back: Normal range of motion and neck supple.  ?Skin: ?   General: Skin is warm and dry.  ?Neurological:  ?   General: No focal deficit present.  ?   Mental Status: He is alert.  ?Psychiatric:     ?   Mood and Affect: Mood normal.     ?   Behavior: Behavior normal.  ?  ? ?Consultants:  ?GI ? ?Procedures:  ? ? ?Data Reviewed: ?Results for orders placed or performed during the hospital encounter of 02/13/22 (from the past 24 hour(s))  ?Glucose, capillary     Status: Abnormal  ? Collection Time: 02/14/22  4:17 PM  ?Result Value Ref Range  ? Glucose-Capillary 228 (H) 70 - 99 mg/dL  ?Glucose, capillary     Status: Abnormal  ? Collection Time: 02/14/22  8:40 PM  ?Result Value Ref Range  ? Glucose-Capillary 219 (H) 70 - 99 mg/dL  ?Glucose, capillary     Status: Abnormal  ? Collection Time: 02/15/22 12:20 AM  ?Result Value Ref Range  ? Glucose-Capillary 197 (H) 70 - 99 mg/dL  ?Glucose, capillary     Status: Abnormal  ? Collection Time: 02/15/22  3:23 AM  ?Result Value Ref Range  ? Glucose-Capillary 202 (H) 70 - 99 mg/dL  ?Comprehensive metabolic panel     Status: Abnormal  ? Collection Time: 02/15/22  4:06 AM  ?Result Value Ref Range  ? Sodium 133 (L) 135 - 145 mmol/L  ? Potassium 4.3 3.5 - 5.1 mmol/L  ? Chloride 102 98 - 111 mmol/L  ? CO2 24 22 - 32 mmol/L  ? Glucose, Bld 192 (H) 70 - 99 mg/dL  ? BUN 20 6 - 20 mg/dL  ? Creatinine, Ser 1.48 (H) 0.61 - 1.24 mg/dL  ? Calcium 8.3 (L) 8.9 - 10.3 mg/dL  ? Total Protein 5.9 (L) 6.5 - 8.1 g/dL  ? Albumin 2.9 (L) 3.5 - 5.0 g/dL  ? AST 14 (L) 15 - 41 U/L  ? ALT 14 0 - 44 U/L  ? Alkaline Phosphatase 52 38 - 126 U/L  ? Total Bilirubin 0.7 0.3 - 1.2 mg/dL  ? GFR, Estimated 54 (L) >60 mL/min  ? Anion gap 7 5 - 15  ?CBC     Status: Abnormal  ? Collection Time: 02/15/22  4:06 AM  ?Result Value Ref Range  ? WBC 12.6 (H) 4.0 - 10.5 K/uL  ? RBC 4.54 4.22 - 5.81 MIL/uL  ? Hemoglobin 13.2 13.0 - 17.0 g/dL  ? HCT 41.4 39.0 - 52.0 %  ? MCV 91.2 80.0 - 100.0 fL  ?  MCH 29.1 26.0 - 34.0 pg  ? MCHC 31.9 30.0 - 36.0 g/dL  ? RDW 14.9 11.5 - 15.5 %  ? Platelets 100 (L) 150 - 400 K/uL  ? nRBC 0.0 0.0 - 0.2 %  ?Lipid panel     Status: Abnormal  ? Collection Time: 02/15/22  4:06 AM  ?Result Value Ref Range  ? Cholesterol 102 0 - 200 mg/dL  ?  Triglycerides 420 (H) <150 mg/dL  ? HDL 18 (L) >40 mg/dL  ? Total CHOL/HDL Ratio 5.7 RATIO  ? VLDL UNABLE TO CALCULATE IF TRIGLYCERIDE OVER 400 mg/dL 0 - 40 mg/dL  ? LDL Cholesterol UNABLE TO CALCULATE IF TRIGLYCERIDE OVER 400 mg/dL 0 - 99 mg/dL  ?Glucose, capillary     Status: Abnormal  ? Collection Time: 02/15/22  7:14 AM  ?Result Value Ref Range  ? Glucose-Capillary 190 (H) 70 - 99 mg/dL  ?Glucose, capillary     Status: Abnormal  ? Collection Time: 02/15/22 11:17 AM  ?Result Value Ref Range  ? Glucose-Capillary 209 (H) 70 - 99 mg/dL  ?  ?I have Reviewed nursing notes, Vitals, and Lab results since pt's last encounter. Pertinent lab results : see above ?I have ordered test including BMP, CBC, Mg ?I have reviewed the last note from staff over past 24 hours ?I have discussed pt's care plan and test results with nursing staff, case manager ? ? LOS: 2 days  ? ?Dwyane Dee, MD ?[facility] ?02/15/2022, 11:36 AM ? ?

## 2022-02-15 NOTE — Progress Notes (Signed)
?Subjective: ?Patient with continued abdominal pain/chest pain and nausea, no vomiting. Did feel as though he may vomit earlier but had a large belch and this subsided. Has been able to tolerate liquid diet. Feels that pain medication is helping to decrease pain some. Denies any BMs today. Last was Sunday or Monday, he thinks.  ? ?Objective: ?Vital signs in last 24 hours: ?Temp:  [98.3 ?F (36.8 ?C)-99.9 ?F (37.7 ?C)] 98.3 ?F (36.8 ?C) (04/26 0516) ?Pulse Rate:  [85-103] 85 (04/26 0516) ?Resp:  [20] 20 (04/26 0516) ?BP: (123-141)/(55-84) 123/77 (04/26 0516) ?SpO2:  [92 %-93 %] 93 % (04/26 0516) ?Weight:  [101.7 kg] 101.7 kg (04/26 0516) ?Last BM Date : 02/11/22 ?General:   Alert and oriented, pleasant ?Head:  Normocephalic and atraumatic. ?Eyes:  No icterus, sclera clear. Conjuctiva pink.  ?Mouth:  Without lesions, mucosa pink and moist.  ?Heart:  S1, S2 present, no murmurs noted.  ?Lungs: Clear to auscultation bilaterally, without wheezing, rales, or rhonchi.  ?Abdomen:  Bowel sounds present, soft, non-distended. TTP of LUQ and epigastric region. No HSM or hernias noted. No rebound or guarding. No masses appreciated  ?Msk:  Symmetrical without gross deformities. Normal posture. ?Pulses:  Normal pulses noted. ?Extremities:  Without clubbing or edema. ?Neurologic:  Alert and  oriented x4;  grossly normal neurologically. ?Skin:  Warm and dry, intact without significant lesions.  ?Psych:  Alert and cooperative. Normal mood and affect. ? ?Intake/Output from previous day: ?04/25 0701 - 04/26 0700 ?In: 854.1 [P.O.:480; I.V.:374.1] ?Out: -  ?Intake/Output this shift: ?No intake/output data recorded. ? ?Lab Results: ?Recent Labs  ?  02/13/22 ?1711 02/14/22 ?0442 02/15/22 ?0406  ?WBC 18.0*  18.9* 14.3* 12.6*  ?HGB 17.5*  17.6* 15.4 13.2  ?HCT 52.4*  51.6 47.1 41.4  ?PLT 196  196 143* 100*  ? ?BMET ?Recent Labs  ?  02/13/22 ?1711 02/14/22 ?0442 02/15/22 ?0406  ?NA 135 136 133*  ?K 4.9 4.8 4.3  ?CL 99 105 102  ?CO2 '24 22  24  '$ ?GLUCOSE 245* 241* 192*  ?BUN 23* 22* 20  ?CREATININE 1.59* 1.51* 1.48*  ?CALCIUM 10.1 8.5* 8.3*  ? ?LFT ?Recent Labs  ?  02/13/22 ?1711 02/14/22 ?0442 02/15/22 ?0406  ?PROT 7.9 6.4* 5.9*  ?ALBUMIN 4.3 3.4* 2.9*  ?AST 22 17 14*  ?ALT '28 19 14  '$ ?ALKPHOS 79 62 52  ?BILITOT 0.8 0.6 0.7  ?BILIDIR 0.1  --   --   ?IBILI 0.7  --   --   ? ?Studies/Results: ?DG Chest 2 View ? ?Result Date: 02/13/2022 ?CLINICAL DATA:  Chest pain. EXAM: CHEST - 2 VIEW COMPARISON:  Chest x-ray 05/22/2021 FINDINGS: The heart size and mediastinal contours are within normal limits. Both lungs are clear. The visualized skeletal structures are unremarkable. IMPRESSION: No active cardiopulmonary disease. Electronically Signed   By: Ronney Asters M.D.   On: 02/13/2022 17:39  ? ?CT Angio Chest PE W and/or Wo Contrast ? ?Result Date: 02/13/2022 ?CLINICAL DATA:  Pulmonary embolism (PE) suspected, high prob. Upper abdominal pain, chest pain. Low O2 sats. EXAM: CT ANGIOGRAPHY CHEST WITH CONTRAST TECHNIQUE: Multidetector CT imaging of the chest was performed using the standard protocol during bolus administration of intravenous contrast. Multiplanar CT image reconstructions and MIPs were obtained to evaluate the vascular anatomy. RADIATION DOSE REDUCTION: This exam was performed according to the departmental dose-optimization program which includes automated exposure control, adjustment of the mA and/or kV according to patient size and/or use of iterative reconstruction technique. CONTRAST:  130m OMNIPAQUE  IOHEXOL 350 MG/ML SOLN COMPARISON:  05/22/2021 FINDINGS: Cardiovascular: No filling defects in the pulmonary arteries to suggest pulmonary emboli. Coronary artery and aortic calcifications. Heart is normal size. Aorta is normal caliber. Mediastinum/Nodes: No mediastinal, hilar, or axillary adenopathy. Trachea and esophagus are unremarkable. Thyroid unremarkable. Lungs/Pleura: Dependent atelectasis. No additional confluent opacities or effusions. Upper  Abdomen: See abdominal CT report Musculoskeletal: Chest wall soft tissues are unremarkable. No acute bony abnormality. Review of the MIP images confirms the above findings. IMPRESSION: No evidence of pulmonary embolus. Coronary artery disease. No acute cardiopulmonary disease. Aortic Atherosclerosis (ICD10-I70.0). Electronically Signed   By: Rolm Baptise M.D.   On: 02/13/2022 19:37  ? ?CT ABDOMEN PELVIS W CONTRAST ? ?Result Date: 02/13/2022 ?CLINICAL DATA:  Upper abdominal pain, chest pain EXAM: CT ABDOMEN AND PELVIS WITH CONTRAST TECHNIQUE: Multidetector CT imaging of the abdomen and pelvis was performed using the standard protocol following bolus administration of intravenous contrast. RADIATION DOSE REDUCTION: This exam was performed according to the departmental dose-optimization program which includes automated exposure control, adjustment of the mA and/or kV according to patient size and/or use of iterative reconstruction technique. CONTRAST:  154m OMNIPAQUE IOHEXOL 350 MG/ML SOLN COMPARISON:  05/22/2021 FINDINGS: Lower chest: See chest CT report Hepatobiliary: Diffuse low-density throughout the liver compatible with fatty infiltration. Gallbladder unremarkable. Pancreas: Extensive edema surrounding the pancreas compatible with acute pancreatitis. Decreased enhancement within the pancreatic tail likely reflects edema. This could reflect an area of pancreatic necrosis. Recommend attention on follow-up imaging. No ductal dilatation. Spleen: No focal abnormality.  Normal size. Adrenals/Urinary Tract: Small scattered left renal stones. No hydronephrosis. Urinary bladder and adrenal glands unremarkable. Mild atrophy and cortical thinning in the right kidney. Stomach/Bowel: Stomach, large and small bowel grossly unremarkable. Vascular/Lymphatic: Prior aorto bi-iliac bypass. Aneurysmal dilatation of the abdominal aorta above the bypass measuring up to 4.3 cm, stable since prior study. No adenopathy Reproductive: No  visible focal abnormality. Other: No free fluid or free air. Musculoskeletal: No acute bony abnormality. IMPRESSION: Changes of acute pancreatitis with edema/stranding surrounding the pancreas and tracking in the left anterior pararenal space. Decreased enhancement within the pancreatic tail could reflect edema or pancreatic necrosis. Recommend attention on follow-up imaging. Diffuse fatty infiltration of the liver. 4.3 cm abdominal aortic aneurysm.  Prior aorto bi-iliac bypass. Electronically Signed   By: KRolm BaptiseM.D.   On: 02/13/2022 19:42  ? ?UKoreaAbdomen Limited RUQ (LIVER/GB) ? ?Result Date: 02/14/2022 ?CLINICAL DATA:  Abnormal LFTs EXAM: ULTRASOUND ABDOMEN LIMITED RIGHT UPPER QUADRANT COMPARISON:  None. FINDINGS: Gallbladder: No gallstones or wall thickening visualized. No sonographic Murphy sign noted by sonographer. Common bile duct: Not clearly visualized. Liver: No focal lesion identified. Severely increased parenchymal echogenicity. Portal vein is patent on color Doppler imaging with normal direction of blood flow towards the liver. Other: None. IMPRESSION: 1.  Severe hepatic steatosis. 2.  The common bile duct is nonvisualized. 3.  No ultrasound abnormality of the gallbladder. Electronically Signed   By: ADelanna AhmadiM.D.   On: 02/14/2022 12:13   ? ?Assessment: ?BErol Flanaginis a 61year old male with history of HTN, type 2 diabetes, HLD, AAA s/p aortobiiliac, bypass adenomatous colon polyps, gastritis, and duodenal ulcer in the setting of NSAIDs in August 2022, who presented to the emergency room the evening of 4/24 with epigastric pain with associated nausea with vomiting x 2 days. ? ?Acute pancreatitis with possible pancreatic necrosis: CT of the abdomen and pelvis with IV contrast showed changes consistent with acute pancreatitis with  presence of decreased enhancement in the pancreatic tail (edema versus focal necrosis), presence of fatty liver.Possibly secondary to hypertriglyceridemia, though  only elevated to 709 (not consistent with guidelines of >1000) vs medication related due to omeprazole. Unclear if there is presence of necrosis. Leukocytosis is likely reactive from acute pancreatitis, imp

## 2022-02-16 DIAGNOSIS — E782 Mixed hyperlipidemia: Secondary | ICD-10-CM | POA: Diagnosis not present

## 2022-02-16 DIAGNOSIS — K858 Other acute pancreatitis without necrosis or infection: Secondary | ICD-10-CM | POA: Diagnosis not present

## 2022-02-16 LAB — COMPREHENSIVE METABOLIC PANEL
ALT: 14 U/L (ref 0–44)
AST: 12 U/L — ABNORMAL LOW (ref 15–41)
Albumin: 2.6 g/dL — ABNORMAL LOW (ref 3.5–5.0)
Alkaline Phosphatase: 50 U/L (ref 38–126)
Anion gap: 8 (ref 5–15)
BUN: 19 mg/dL (ref 6–20)
CO2: 24 mmol/L (ref 22–32)
Calcium: 8.4 mg/dL — ABNORMAL LOW (ref 8.9–10.3)
Chloride: 100 mmol/L (ref 98–111)
Creatinine, Ser: 1.31 mg/dL — ABNORMAL HIGH (ref 0.61–1.24)
GFR, Estimated: >60 mL/min (ref >60)
Glucose, Bld: 166 mg/dL — ABNORMAL HIGH (ref 70–99)
Potassium: 4.1 mmol/L (ref 3.5–5.1)
Sodium: 132 mmol/L — ABNORMAL LOW (ref 135–145)
Total Bilirubin: 0.6 mg/dL (ref 0.3–1.2)
Total Protein: 5.8 g/dL — ABNORMAL LOW (ref 6.5–8.1)

## 2022-02-16 LAB — CBC
HCT: 38.2 % — ABNORMAL LOW (ref 39.0–52.0)
Hemoglobin: 12.5 g/dL — ABNORMAL LOW (ref 13.0–17.0)
MCH: 29.6 pg (ref 26.0–34.0)
MCHC: 32.7 g/dL (ref 30.0–36.0)
MCV: 90.3 fL (ref 80.0–100.0)
Platelets: 96 10*3/uL — ABNORMAL LOW (ref 150–400)
RBC: 4.23 MIL/uL (ref 4.22–5.81)
RDW: 14.5 % (ref 11.5–15.5)
WBC: 9.9 10*3/uL (ref 4.0–10.5)
nRBC: 0 % (ref 0.0–0.2)

## 2022-02-16 LAB — GLUCOSE, CAPILLARY
Glucose-Capillary: 148 mg/dL — ABNORMAL HIGH (ref 70–99)
Glucose-Capillary: 169 mg/dL — ABNORMAL HIGH (ref 70–99)
Glucose-Capillary: 169 mg/dL — ABNORMAL HIGH (ref 70–99)
Glucose-Capillary: 181 mg/dL — ABNORMAL HIGH (ref 70–99)
Glucose-Capillary: 183 mg/dL — ABNORMAL HIGH (ref 70–99)
Glucose-Capillary: 183 mg/dL — ABNORMAL HIGH (ref 70–99)

## 2022-02-16 LAB — MAGNESIUM: Magnesium: 1.9 mg/dL (ref 1.7–2.4)

## 2022-02-16 MED ORDER — ENOXAPARIN SODIUM 60 MG/0.6ML IJ SOSY
50.0000 mg | PREFILLED_SYRINGE | Freq: Every day | INTRAMUSCULAR | Status: DC
  Administered 2022-02-17: 50 mg via SUBCUTANEOUS
  Filled 2022-02-16: qty 0.6

## 2022-02-16 MED ORDER — DOCUSATE SODIUM 100 MG PO CAPS
100.0000 mg | ORAL_CAPSULE | Freq: Every day | ORAL | Status: DC
  Administered 2022-02-16 – 2022-02-17 (×2): 100 mg via ORAL
  Filled 2022-02-16 (×2): qty 1

## 2022-02-16 NOTE — Progress Notes (Signed)
?Progress Note ? ? ? ?[employer]   ?KNL:[id number]  ?DOB: [birth year]  ?DOA: 02/13/2022     3 ?PCP: Alanson Puls, [facility] ? ?Initial CC: abdominal pain, N/V ? ?Hospital Course: ?Mr. Funke is a 61 year old male with PMH HTN, DMII, HLD, duodenal ulcer who presented with epigastric pain that started on the evening of 02/11/2022 after eating dinner.  He had some nausea without emesis and some subjective fevers and chills.  He denies any NSAIDs recently.  He states that his abdominal pain had worsened the last 2 days resulting in his ED visit.  He has had some substernal chest pain and left-sided chest pain that started around the same time.  He denies any worsening shortness of breath, coughing, hemoptysis.  He states that his chest pain is off and on worsening with any movement.  His last bowel movement was in the evening of 02/11/2022. ?In the ED, the patient had a low-grade temperature of 99.1 ?F.  He was tachycardic into the 120s.  He was hemodynamically stable with oxygen saturation 93% on room air.  CTA chest was negative for pulmonary embolus.  CT abdomen and pelvis showed extensive edema surrounding the pancreas consistent with pancreatitis.  There was an area of decreased enhancement in the pancreatic tail consistent with pancreatic edema versus necrosis.  He is status post aortobiiliac bypass.  The patient was started on IV fluids and opioids for his pancreatitis.  Triglycerides were 709.  EKG showed sinus rhythm and nonspecific T wave changes.  Troponins were 6>> 6. ?His pain slowly improved during hospitalization and he was started on a diet. ? ?Interval History:  ?No events overnight.  Pain and nausea improved compared to yesterday. Feeling restless and was amenable to diet advancement if allowed today.  ? ?Assessment and Plan: ?* Acute pancreatitis ?- Etiology still suspected due to hypertriglyceridemia.  Triglycerides 709 on admission (some incidence rates seen in patients with TG <~850). Other workup  pending for etiology per GI as well ?- I agree, his age is higher than would be expected for primary hyperTG, but given his obesity and DMII not unreasonable to have a component of secondary hyperTG contributing to this presentation ?- Continue fluids ?- Tolerating clear liquids and wishing to advance; will defer diet advancement to GI  ? ?Mixed hyperlipidemia ?- Initial TG 709; no insulin drip started however repeat TG improved to 420 after admission ?-Continue Crestor and fenofibrate ?- Added omega ?- outpatient referral to lipid clinic ? ?Uncontrolled type 2 diabetes mellitus with hyperglycemia, without long-term current use of insulin ([place]) ?- Holding metformin and glimepiride ?- 02/14/2022 hemoglobin A1c 8% ?- NovoLog sliding scale ?- starting low dose semglee which may also help with TG ? ?Chronic kidney disease, stage 3b ([place]) ?Baseline creatinine 1.5-1.8 ?Presented with serum creatinine 1.59 ? ?Coronary artery disease ?troponins 6>>6 ?CTA chest negative for PE ?Continue aspirin ? ?Hypertension ?Continue metoprolol tartrate ? ? ? ?Old records reviewed in assessment of this patient ? ?Antimicrobials: ? ? ?DVT prophylaxis:  ?enoxaparin (LOVENOX) injection 40 mg Start: 02/14/22 1000 ? ? ?Code Status:   Code Status: Full Code ? ?Disposition Plan: Home in 1 to 2 days ?Status is: Inpatient ? ?Objective: ?Blood pressure 127/82, pulse 71, temperature 98.2 ?F (36.8 ?C), temperature source Oral, resp. rate 20, height '5\' 9"'$  (1.753 m), weight 101.6 kg, SpO2 94 %.  ?Examination:  ?Physical Exam ?Constitutional:   ?   General: He is not in acute distress. ?   Appearance: Normal appearance.  ?  HENT:  ?   Head: Normocephalic and atraumatic.  ?   Mouth/Throat:  ?   Mouth: Mucous membranes are moist.  ?Eyes:  ?   Extraocular Movements: Extraocular movements intact.  ?Cardiovascular:  ?   Rate and Rhythm: Normal rate and regular rhythm.  ?   Heart sounds: Normal heart sounds.  ?Pulmonary:  ?   Effort: Pulmonary effort is normal.  No respiratory distress.  ?   Breath sounds: Normal breath sounds. No wheezing.  ?Abdominal:  ?   General: Bowel sounds are normal. There is no distension.  ?   Palpations: Abdomen is soft.  ?   Comments: Obese. Mild nonspecific tenderness throughout with no rebound or guarding  ?Musculoskeletal:     ?   General: Normal range of motion.  ?   Cervical back: Normal range of motion and neck supple.  ?Skin: ?   General: Skin is warm and dry.  ?Neurological:  ?   General: No focal deficit present.  ?   Mental Status: He is alert.  ?Psychiatric:     ?   Mood and Affect: Mood normal.     ?   Behavior: Behavior normal.  ?  ? ?Consultants:  ?GI ? ?Procedures:  ? ? ?Data Reviewed: ?Results for orders placed or performed during the hospital encounter of 02/13/22 (from the past 24 hour(s))  ?Glucose, capillary     Status: Abnormal  ? Collection Time: 02/15/22 11:17 AM  ?Result Value Ref Range  ? Glucose-Capillary 209 (H) 70 - 99 mg/dL  ?Glucose, capillary     Status: Abnormal  ? Collection Time: 02/15/22  4:47 PM  ?Result Value Ref Range  ? Glucose-Capillary 182 (H) 70 - 99 mg/dL  ?Glucose, capillary     Status: Abnormal  ? Collection Time: 02/15/22  8:02 PM  ?Result Value Ref Range  ? Glucose-Capillary 159 (H) 70 - 99 mg/dL  ?Glucose, capillary     Status: Abnormal  ? Collection Time: 02/15/22 11:38 PM  ?Result Value Ref Range  ? Glucose-Capillary 140 (H) 70 - 99 mg/dL  ?Glucose, capillary     Status: Abnormal  ? Collection Time: 02/16/22  3:21 AM  ?Result Value Ref Range  ? Glucose-Capillary 169 (H) 70 - 99 mg/dL  ?Comprehensive metabolic panel     Status: Abnormal  ? Collection Time: 02/16/22  3:50 AM  ?Result Value Ref Range  ? Sodium 132 (L) 135 - 145 mmol/L  ? Potassium 4.1 3.5 - 5.1 mmol/L  ? Chloride 100 98 - 111 mmol/L  ? CO2 24 22 - 32 mmol/L  ? Glucose, Bld 166 (H) 70 - 99 mg/dL  ? BUN 19 6 - 20 mg/dL  ? Creatinine, Ser 1.31 (H) 0.61 - 1.24 mg/dL  ? Calcium 8.4 (L) 8.9 - 10.3 mg/dL  ? Total Protein 5.8 (L) 6.5 - 8.1  g/dL  ? Albumin 2.6 (L) 3.5 - 5.0 g/dL  ? AST 12 (L) 15 - 41 U/L  ? ALT 14 0 - 44 U/L  ? Alkaline Phosphatase 50 38 - 126 U/L  ? Total Bilirubin 0.6 0.3 - 1.2 mg/dL  ? GFR, Estimated >60 >60 mL/min  ? Anion gap 8 5 - 15  ?CBC     Status: Abnormal  ? Collection Time: 02/16/22  3:50 AM  ?Result Value Ref Range  ? WBC 9.9 4.0 - 10.5 K/uL  ? RBC 4.23 4.22 - 5.81 MIL/uL  ? Hemoglobin 12.5 (L) 13.0 - 17.0 g/dL  ? HCT 38.2 (  L) 39.0 - 52.0 %  ? MCV 90.3 80.0 - 100.0 fL  ? MCH 29.6 26.0 - 34.0 pg  ? MCHC 32.7 30.0 - 36.0 g/dL  ? RDW 14.5 11.5 - 15.5 %  ? Platelets 96 (L) 150 - 400 K/uL  ? nRBC 0.0 0.0 - 0.2 %  ?Magnesium     Status: None  ? Collection Time: 02/16/22  3:50 AM  ?Result Value Ref Range  ? Magnesium 1.9 1.7 - 2.4 mg/dL  ?Glucose, capillary     Status: Abnormal  ? Collection Time: 02/16/22  7:33 AM  ?Result Value Ref Range  ? Glucose-Capillary 148 (H) 70 - 99 mg/dL  ?  ?I have Reviewed nursing notes, Vitals, and Lab results since pt's last encounter. Pertinent lab results : see above ?I have ordered test including BMP, CBC, Mg ?I have reviewed the last note from staff over past 24 hours ?I have discussed pt's care plan and test results with nursing staff, case manager ? ? LOS: 3 days  ? ?Dwyane Dee, MD ?[facility] ?02/16/2022, 9:25 AM ? ?

## 2022-02-16 NOTE — Progress Notes (Signed)
? ?Gastroenterology Progress Note  ? ?Referring Provider: No ref. provider found ?Primary Care Physician:  [facility] ?Primary Gastroenterologist:  Dr. Abbey Chatters ? ?Patient ID: Clayton Peterson; [id number]; [birth year]  ? ? ?Subjective  ? ?Still having some pain. Last dose of pain medication early this morning. No N/V today. Still having some abdominal discomfort. No BM since admission. Feeling bloated.  Had broth, jello, apple juice with some discomfort today but no severe pain. Does not want miralax.  ? ? ?Objective  ? ?Vital signs in last 24 hours ?Temp:  [97.9 ?F (36.6 ?C)-98.7 ?F (37.1 ?C)] 98.2 ?F (36.8 ?C) (04/27 0321) ?Pulse Rate:  [71-86] 71 (04/27 0321) ?Resp:  [18-20] 20 (04/27 0321) ?BP: (127-139)/(77-85) 127/82 (04/27 0321) ?SpO2:  [91 %-94 %] 94 % (04/27 0321) ?Weight:  [101.6 kg] 101.6 kg (04/27 0500) ?Last BM Date : 02/12/22 ? ?Physical Exam ?General:   Alert and oriented, pleasant ?Head:  Normocephalic and atraumatic. ?Eyes:  No icterus, sclera clear. Conjuctiva pink.  ?Mouth:  Without lesions, mucosa pink and moist.  ?Neck:  Supple, without thyromegaly or masses.  ?Heart:  S1, S2 present, no murmurs noted.  ?Lungs: Clear to auscultation bilaterally, without wheezing, rales, or rhonchi.  ?Abdomen:  Bowel sounds present, soft, non-tender, non-distended. No HSM or hernias noted. No rebound or guarding. No masses appreciated  ?Msk:  Symmetrical without gross deformities. Normal posture. ?Pulses:  Normal pulses noted. ?Extremities:  Without clubbing or edema. ?Neurologic:  Alert and  oriented x4;  grossly normal neurologically. ?Skin:  Warm and dry, intact without significant lesions.  ?Cervical Nodes:  No significant cervical adenopathy. ?Psych:  Alert and cooperative. Normal mood and affect. ? ?Intake/Output from previous day: ?04/26 0701 - 04/27 0700 ?In: 19 [P.O.:660] ?Out: 500 [Urine:500] ?Intake/Output this shift: ?Total I/O ?In: 240 [P.O.:240] ?Out: 600 [Urine:600] ? ?Lab Results ? ?Recent  Labs  ?  02/14/22 ?0442 02/15/22 ?0406 02/16/22 ?0350  ?WBC 14.3* 12.6* 9.9  ?HGB 15.4 13.2 12.5*  ?HCT 47.1 41.4 38.2*  ?PLT 143* 100* 96*  ? ?BMET ?Recent Labs  ?  02/14/22 ?0442 02/15/22 ?0406 02/16/22 ?0350  ?NA 136 133* 132*  ?K 4.8 4.3 4.1  ?CL 105 102 100  ?CO2 '22 24 24  '$ ?GLUCOSE 241* 192* 166*  ?BUN 22* 20 19  ?CREATININE 1.51* 1.48* 1.31*  ?CALCIUM 8.5* 8.3* 8.4*  ? ?LFT ?Recent Labs  ?  02/13/22 ?1711 02/14/22 ?0442 02/15/22 ?0406 02/16/22 ?0350  ?PROT 7.9 6.4* 5.9* 5.8*  ?ALBUMIN 4.3 3.4* 2.9* 2.6*  ?AST 22 17 14* 12*  ?ALT '28 19 14 14  '$ ?ALKPHOS 79 62 52 50  ?BILITOT 0.8 0.6 0.7 0.6  ?BILIDIR 0.1  --   --   --   ?IBILI 0.7  --   --   --   ? ?PT/INR ?No results for input(s): LABPROT, INR in the last 72 hours. ?Hepatitis Panel ?No results for input(s): HEPBSAG, HCVAB, HEPAIGM, HEPBIGM in the last 72 hours. ? ? ?Studies/Results ?DG Chest 2 View ? ?Result Date: 02/13/2022 ?CLINICAL DATA:  Chest pain. EXAM: CHEST - 2 VIEW COMPARISON:  Chest x-ray 05/22/2021 FINDINGS: The heart size and mediastinal contours are within normal limits. Both lungs are clear. The visualized skeletal structures are unremarkable. IMPRESSION: No active cardiopulmonary disease. Electronically Signed   By: Ronney Asters M.D.   On: 02/13/2022 17:39  ? ?CT Angio Chest PE W and/or Wo Contrast ? ?Result Date: 02/13/2022 ?CLINICAL DATA:  Pulmonary embolism (PE) suspected, high prob. Upper abdominal  pain, chest pain. Low O2 sats. EXAM: CT ANGIOGRAPHY CHEST WITH CONTRAST TECHNIQUE: Multidetector CT imaging of the chest was performed using the standard protocol during bolus administration of intravenous contrast. Multiplanar CT image reconstructions and MIPs were obtained to evaluate the vascular anatomy. RADIATION DOSE REDUCTION: This exam was performed according to the departmental dose-optimization program which includes automated exposure control, adjustment of the mA and/or kV according to patient size and/or use of iterative reconstruction  technique. CONTRAST:  151m OMNIPAQUE IOHEXOL 350 MG/ML SOLN COMPARISON:  05/22/2021 FINDINGS: Cardiovascular: No filling defects in the pulmonary arteries to suggest pulmonary emboli. Coronary artery and aortic calcifications. Heart is normal size. Aorta is normal caliber. Mediastinum/Nodes: No mediastinal, hilar, or axillary adenopathy. Trachea and esophagus are unremarkable. Thyroid unremarkable. Lungs/Pleura: Dependent atelectasis. No additional confluent opacities or effusions. Upper Abdomen: See abdominal CT report Musculoskeletal: Chest wall soft tissues are unremarkable. No acute bony abnormality. Review of the MIP images confirms the above findings. IMPRESSION: No evidence of pulmonary embolus. Coronary artery disease. No acute cardiopulmonary disease. Aortic Atherosclerosis (ICD10-I70.0). Electronically Signed   By: KRolm BaptiseM.D.   On: 02/13/2022 19:37  ? ?CT ABDOMEN PELVIS W CONTRAST ? ?Result Date: 02/13/2022 ?CLINICAL DATA:  Upper abdominal pain, chest pain EXAM: CT ABDOMEN AND PELVIS WITH CONTRAST TECHNIQUE: Multidetector CT imaging of the abdomen and pelvis was performed using the standard protocol following bolus administration of intravenous contrast. RADIATION DOSE REDUCTION: This exam was performed according to the departmental dose-optimization program which includes automated exposure control, adjustment of the mA and/or kV according to patient size and/or use of iterative reconstruction technique. CONTRAST:  1081mOMNIPAQUE IOHEXOL 350 MG/ML SOLN COMPARISON:  05/22/2021 FINDINGS: Lower chest: See chest CT report Hepatobiliary: Diffuse low-density throughout the liver compatible with fatty infiltration. Gallbladder unremarkable. Pancreas: Extensive edema surrounding the pancreas compatible with acute pancreatitis. Decreased enhancement within the pancreatic tail likely reflects edema. This could reflect an area of pancreatic necrosis. Recommend attention on follow-up imaging. No ductal  dilatation. Spleen: No focal abnormality.  Normal size. Adrenals/Urinary Tract: Small scattered left renal stones. No hydronephrosis. Urinary bladder and adrenal glands unremarkable. Mild atrophy and cortical thinning in the right kidney. Stomach/Bowel: Stomach, large and small bowel grossly unremarkable. Vascular/Lymphatic: Prior aorto bi-iliac bypass. Aneurysmal dilatation of the abdominal aorta above the bypass measuring up to 4.3 cm, stable since prior study. No adenopathy Reproductive: No visible focal abnormality. Other: No free fluid or free air. Musculoskeletal: No acute bony abnormality. IMPRESSION: Changes of acute pancreatitis with edema/stranding surrounding the pancreas and tracking in the left anterior pararenal space. Decreased enhancement within the pancreatic tail could reflect edema or pancreatic necrosis. Recommend attention on follow-up imaging. Diffuse fatty infiltration of the liver. 4.3 cm abdominal aortic aneurysm.  Prior aorto bi-iliac bypass. Electronically Signed   By: KeRolm Baptise.D.   On: 02/13/2022 19:42  ? ?USKoreabdomen Limited RUQ (LIVER/GB) ? ?Result Date: 02/14/2022 ?CLINICAL DATA:  Abnormal LFTs EXAM: ULTRASOUND ABDOMEN LIMITED RIGHT UPPER QUADRANT COMPARISON:  None. FINDINGS: Gallbladder: No gallstones or wall thickening visualized. No sonographic Murphy sign noted by sonographer. Common bile duct: Not clearly visualized. Liver: No focal lesion identified. Severely increased parenchymal echogenicity. Portal vein is patent on color Doppler imaging with normal direction of blood flow towards the liver. Other: None. IMPRESSION: 1.  Severe hepatic steatosis. 2.  The common bile duct is nonvisualized. 3.  No ultrasound abnormality of the gallbladder. Electronically Signed   By: AlDelanna Ahmadi.D.   On: 02/14/2022 12:13   ? ?  Assessment  ?61 y.o. male with a history of HTN, type 2 diabetes, HLD, AAA s/p aortoiliac bypass, adenomatous polyps, gastritis, duodenal ulcer in the setting of  NSAIDs in August 2022.  He presented to the ED on 4/24 with epigastric pain with associated nausea and vomiting for 2 days. ? ?Acute pancreatitis with possible pancreatic necrosis/hypertriglyceridemia: CT A/P

## 2022-02-17 ENCOUNTER — Telehealth: Payer: Self-pay | Admitting: Gastroenterology

## 2022-02-17 DIAGNOSIS — K859 Acute pancreatitis without necrosis or infection, unspecified: Secondary | ICD-10-CM | POA: Diagnosis not present

## 2022-02-17 DIAGNOSIS — E782 Mixed hyperlipidemia: Secondary | ICD-10-CM | POA: Diagnosis not present

## 2022-02-17 DIAGNOSIS — I10 Essential (primary) hypertension: Secondary | ICD-10-CM | POA: Diagnosis not present

## 2022-02-17 DIAGNOSIS — E1165 Type 2 diabetes mellitus with hyperglycemia: Secondary | ICD-10-CM | POA: Diagnosis not present

## 2022-02-17 DIAGNOSIS — K858 Other acute pancreatitis without necrosis or infection: Secondary | ICD-10-CM | POA: Diagnosis not present

## 2022-02-17 LAB — CBC WITH DIFFERENTIAL/PLATELET
Abs Immature Granulocytes: 0.06 10*3/uL (ref 0.00–0.07)
Basophils Absolute: 0 10*3/uL (ref 0.0–0.1)
Basophils Relative: 1 %
Eosinophils Absolute: 0.1 10*3/uL (ref 0.0–0.5)
Eosinophils Relative: 2 %
HCT: 39 % (ref 39.0–52.0)
Hemoglobin: 12.7 g/dL — ABNORMAL LOW (ref 13.0–17.0)
Immature Granulocytes: 1 %
Lymphocytes Relative: 11 %
Lymphs Abs: 0.8 10*3/uL (ref 0.7–4.0)
MCH: 28.9 pg (ref 26.0–34.0)
MCHC: 32.6 g/dL (ref 30.0–36.0)
MCV: 88.8 fL (ref 80.0–100.0)
Monocytes Absolute: 0.9 10*3/uL (ref 0.1–1.0)
Monocytes Relative: 11 %
Neutro Abs: 6 10*3/uL (ref 1.7–7.7)
Neutrophils Relative %: 74 %
Platelets: 111 10*3/uL — ABNORMAL LOW (ref 150–400)
RBC: 4.39 MIL/uL (ref 4.22–5.81)
RDW: 14.2 % (ref 11.5–15.5)
WBC: 7.9 10*3/uL (ref 4.0–10.5)
nRBC: 0 % (ref 0.0–0.2)

## 2022-02-17 LAB — COMPREHENSIVE METABOLIC PANEL
ALT: 18 U/L (ref 0–44)
AST: 14 U/L — ABNORMAL LOW (ref 15–41)
Albumin: 2.6 g/dL — ABNORMAL LOW (ref 3.5–5.0)
Alkaline Phosphatase: 62 U/L (ref 38–126)
Anion gap: 9 (ref 5–15)
BUN: 19 mg/dL (ref 6–20)
CO2: 25 mmol/L (ref 22–32)
Calcium: 8.5 mg/dL — ABNORMAL LOW (ref 8.9–10.3)
Chloride: 99 mmol/L (ref 98–111)
Creatinine, Ser: 1.37 mg/dL — ABNORMAL HIGH (ref 0.61–1.24)
GFR, Estimated: 59 mL/min — ABNORMAL LOW (ref >60)
Glucose, Bld: 177 mg/dL — ABNORMAL HIGH (ref 70–99)
Potassium: 3.9 mmol/L (ref 3.5–5.1)
Sodium: 133 mmol/L — ABNORMAL LOW (ref 135–145)
Total Bilirubin: 0.7 mg/dL (ref 0.3–1.2)
Total Protein: 5.9 g/dL — ABNORMAL LOW (ref 6.5–8.1)

## 2022-02-17 LAB — GLUCOSE, CAPILLARY
Glucose-Capillary: 192 mg/dL — ABNORMAL HIGH (ref 70–99)
Glucose-Capillary: 173 mg/dL — ABNORMAL HIGH (ref 70–99)
Glucose-Capillary: 260 mg/dL — ABNORMAL HIGH (ref 70–99)
Glucose-Capillary: 173 mg/dL — ABNORMAL HIGH (ref 70–99)

## 2022-02-17 LAB — MAGNESIUM: Magnesium: 2 mg/dL (ref 1.7–2.4)

## 2022-02-17 MED ORDER — SENNOSIDES-DOCUSATE SODIUM 8.6-50 MG PO TABS
1.0000 | ORAL_TABLET | Freq: Two times a day (BID) | ORAL | Status: DC
  Administered 2022-02-17: 1 via ORAL
  Filled 2022-02-17: qty 1

## 2022-02-17 MED ORDER — OXYCODONE HCL 5 MG PO TABS
5.0000 mg | ORAL_TABLET | ORAL | Status: DC | PRN
  Administered 2022-02-17: 5 mg via ORAL
  Filled 2022-02-17: qty 1

## 2022-02-17 MED ORDER — POLYETHYLENE GLYCOL 3350 17 G PO PACK
17.0000 g | PACK | Freq: Every day | ORAL | Status: DC
  Administered 2022-02-17: 17 g via ORAL
  Filled 2022-02-17: qty 1

## 2022-02-17 MED ORDER — OMEGA-3-ACID ETHYL ESTERS 1 G PO CAPS: 2.0000 g | ORAL_CAPSULE | Freq: Two times a day (BID) | ORAL | 3 refills | Status: DC

## 2022-02-17 MED ORDER — FENOFIBRATE 160 MG PO TABS: 160.0000 mg | ORAL_TABLET | Freq: Every day | ORAL | 3 refills | Status: DC

## 2022-02-17 NOTE — Discharge Summary (Signed)
?Physician Discharge Summary ?  ?Patient: Clayton Peterson MRN: [id number] DOB: Oct 18, 1961  ?Admit date:     02/13/2022  ?Discharge date: 02/17/22  ?Discharge Physician: Dwyane Dee  ? ?PCP: [facility], [facility]  ? ?Recommendations at discharge:  ? ?Discuss starting insulin vs more lifestyle changes first ?Follow up with GI for CT pancreatic protocol in 10-12 weeks ?Follow up pending IgG 4 level  ?Referral placed to lipid clinic  ? ?Discharge Diagnoses: ?Principal Problem: ?  Acute pancreatitis ?Active Problems: ?  Mixed hyperlipidemia ?  Uncontrolled type 2 diabetes mellitus with hyperglycemia, without long-term current use of insulin ([place]) ?  Hypertension ?  Coronary artery disease ?  Chronic kidney disease, stage 3b ([place]) ? ?Resolved Problems: ?  * No resolved hospital problems. * ? ?Hospital Course: ?Mr. Auriemma is a 61 year old male with PMH HTN, DMII, HLD, duodenal ulcer who presented with epigastric pain that started on the evening of 02/11/2022 after eating dinner.  He had some nausea without emesis and some subjective fevers and chills.  He denies any NSAIDs recently.  He states that his abdominal pain had worsened the last 2 days resulting in his ED visit.  He has had some substernal chest pain and left-sided chest pain that started around the same time.  He denies any worsening shortness of breath, coughing, hemoptysis.  He states that his chest pain is off and on worsening with any movement.  His last bowel movement was in the evening of 02/11/2022. ?In the ED, the patient had a low-grade temperature of 99.1 ?F.  He was tachycardic into the 120s.  He was hemodynamically stable with oxygen saturation 93% on room air.  CTA chest was negative for pulmonary embolus.  CT abdomen and pelvis showed extensive edema surrounding the pancreas consistent with pancreatitis.  There was an area of decreased enhancement in the pancreatic tail consistent with pancreatic edema versus necrosis.  He is status post  aortobiiliac bypass.  The patient was started on IV fluids and opioids for his pancreatitis.  Triglycerides were 709.  EKG showed sinus rhythm and nonspecific T wave changes.  Troponins were 6>> 6. ?His pain slowly improved during hospitalization and he was started on a diet. ? ?Assessment and Plan: ?* Acute pancreatitis ?- Etiology still suspected due to hypertriglyceridemia.  Triglycerides 709 on admission (some incidence rates seen in patients with TG <~850). Other workup pending for etiology per GI as well; follow up IgG 4 level ?- outpatient CT pancreatic protocol in 10-12 weeks recommended per GI ?- I agree, his age is higher than would be expected for primary hyperTG, but given his obesity and DMII not unreasonable to have a component of secondary hyperTG contributing to this presentation ?- Continue fluids ?- Tolerated CLD and advanced to FLD and soft diet; if tolerates, will d/c home ? ?Mixed hyperlipidemia ?- Initial TG 709; no insulin drip started however repeat TG improved to 420 after admission ?-Continue Crestor and fenofibrate ?- Added omega ?- outpatient referral to lipid clinic ? ?Uncontrolled type 2 diabetes mellitus with hyperglycemia, without long-term current use of insulin ([place]) ?- 02/14/2022 hemoglobin A1c 8% ?- NovoLog sliding scale and semglee in hospital ?- continue home regimen at discharge and discuss with primary regarding starting on insulin vs aggressive lifestyle changes attempt first in addition to above meds  ? ?Chronic kidney disease, stage 3b ([place]) ?Baseline creatinine 1.5-1.8 ?Presented with serum creatinine 1.59 ? ?Coronary artery disease ?troponins 6>>6 ?CTA chest negative for PE ?Continue aspirin ? ?Hypertension ?Continue  metoprolol tartrate ? ? ? ? ?  ? ? ?Consultants: GI ?Procedures performed:   ?Disposition: Home ?Diet recommendation:  ?Discharge Diet Orders (From admission, onward)  ? ?  Start     Ordered  ? 02/17/22 0000  Diet - low sodium heart healthy       ? 02/17/22  1758  ? 02/17/22 0000  Diet Carb Modified       ? 02/17/22 1758  ? ?  ?  ? ?  ? ?Cardiac and Carb modified diet ?DISCHARGE MEDICATION: ?Allergies as of 02/17/2022   ? ?   Reactions  ? Bee Venom Anaphylaxis  ? Coconut Oil Anaphylaxis  ? ?  ? ?  ?Medication List  ?  ? ?STOP taking these medications   ? ?omeprazole 20 MG capsule ?Commonly known as: PRILOSEC ?  ?polyethylene glycol-electrolytes 420 g solution ?Commonly known as: NuLYTELY ?  ?sucralfate 1 GM/10ML suspension ?Commonly known as: Carafate ?  ? ?  ? ?TAKE these medications   ? ?acetaminophen 325 MG tablet ?Commonly known as: TYLENOL ?Take 325 mg by mouth every 4 (four) hours as needed for pain. ?  ?aspirin 81 MG EC tablet ?Take 81 mg by mouth daily. ?  ?cyclobenzaprine 5 MG tablet ?Commonly known as: FLEXERIL ?Take 5 mg by mouth every 8 (eight) hours as needed. ?  ?fenofibrate 160 MG tablet ?Take 1 tablet (160 mg total) by mouth daily. ?Start taking on: February 18, 2022 ?What changed:  ?medication strength ?how much to take ?  ?gabapentin 100 MG capsule ?Commonly known as: NEURONTIN ?Take 100 mg by mouth daily. ?  ?glimepiride 4 MG tablet ?Commonly known as: AMARYL ?Take 4 mg by mouth 2 (two) times daily. ?  ?lisinopril 20 MG tablet ?Commonly known as: ZESTRIL ?Take 20 mg by mouth daily. ?  ?metFORMIN 1000 MG tablet ?Commonly known as: GLUCOPHAGE ?Take 1,000 mg by mouth 2 (two) times daily. ?  ?metoprolol tartrate 50 MG tablet ?Commonly known as: LOPRESSOR ?Take 50 mg by mouth 2 (two) times daily. ?  ?omega-3 acid ethyl esters 1 g capsule ?Commonly known as: LOVAZA ?Take 2 capsules (2 g total) by mouth 2 (two) times daily. ?  ?rosuvastatin 40 MG tablet ?Commonly known as: CRESTOR ?Take 40 mg by mouth at bedtime. ?  ?Vitamin D3 125 MCG (5000 UT) Tabs ?Take 1 tablet by mouth daily. ?  ? ?  ? ? Follow-up Information   ? ? [facility], [facility]. Schedule an appointment as soon as possible for a visit in 1 week(s).   ?Why: Discuss need for possible insulin vs  aggressive lifestyle changes ?Contact information: ?[address] ?[place] [address] ?[phone] ? ? ?  ?  ? ? Rourk, Cristopher Estimable, MD. Schedule an appointment as soon as possible for a visit in 2 month(s).   ?Specialty: Gastroenterology ?Why: Recommended to have CT pancreatic protocol in 10-12 weeks ?Contact information: ?[address] ?[place] [address] ?[phone] ? ? ?  ?  ? ?  ?  ? ?  ? ?Discharge Exam: ?Filed Weights  ? 02/15/22 0516 02/16/22 0500 02/17/22 0530  ?Weight: 101.7 kg 101.6 kg 99.8 kg  ? ?Physical Exam ?Constitutional:   ?   General: He is not in acute distress. ?   Appearance: Normal appearance.  ?HENT:  ?   Head: Normocephalic and atraumatic.  ?   Mouth/Throat:  ?   Mouth: Mucous membranes are moist.  ?Eyes:  ?   Extraocular Movements: Extraocular movements intact.  ?  Cardiovascular:  ?   Rate and Rhythm: Normal rate and regular rhythm.  ?   Heart sounds: Normal heart sounds.  ?Pulmonary:  ?   Effort: Pulmonary effort is normal. No respiratory distress.  ?   Breath sounds: Normal breath sounds. No wheezing.  ?Abdominal:  ?   General: Bowel sounds are normal. There is no distension.  ?   Palpations: Abdomen is soft.  ?   Comments: Obese. Mild nonspecific tenderness throughout with no rebound or guarding  ?Musculoskeletal:     ?   General: Normal range of motion.  ?   Cervical back: Normal range of motion and neck supple.  ?Skin: ?   General: Skin is warm and dry.  ?Neurological:  ?   General: No focal deficit present.  ?   Mental Status: He is alert.  ?Psychiatric:     ?   Mood and Affect: Mood normal.     ?   Behavior: Behavior normal.  ? ? ? ?Condition at discharge: stable ? ?The results of significant diagnostics from this hospitalization (including imaging, microbiology, ancillary and laboratory) are listed below for reference.  ? ?Imaging Studies: ?DG Chest 2 View ? ?Result Date: 02/13/2022 ?CLINICAL DATA:  Chest pain. EXAM: CHEST - 2 VIEW COMPARISON:  Chest x-ray 05/22/2021 FINDINGS:  The heart size and mediastinal contours are within normal limits. Both lungs are clear. The visualized skeletal structures are unremarkable. IMPRESSION: No active cardiopulmonary disease. Electronically Signed   B

## 2022-02-17 NOTE — Care Management Important Message (Signed)
Important Message ? ?Patient Details  ?Name: Clayton Peterson ?MRN: [id number] ?Date of Birth: 08-Mar-1961 ? ? ?Medicare Important Message Given:  Yes ? ? ? ? ?Tommy Medal ?02/17/2022, 1:22 PM ?

## 2022-02-17 NOTE — Progress Notes (Signed)
?Progress Note ? ? ? ?[employer]   ?EXH:[id number]  ?DOB: [birth year]  ?DOA: 02/13/2022     4 ?PCP: Alanson Puls, [facility] ? ?Initial CC: abdominal pain, N/V ? ?Hospital Course: ?Clayton Peterson is a 61 year old male with PMH HTN, DMII, HLD, duodenal ulcer who presented with epigastric pain that started on the evening of 02/11/2022 after eating dinner.  He had some nausea without emesis and some subjective fevers and chills.  He denies any NSAIDs recently.  He states that his abdominal pain had worsened the last 2 days resulting in his ED visit.  He has had some substernal chest pain and left-sided chest pain that started around the same time.  He denies any worsening shortness of breath, coughing, hemoptysis.  He states that his chest pain is off and on worsening with any movement.  His last bowel movement was in the evening of 02/11/2022. ?In the ED, the patient had a low-grade temperature of 99.1 ?F.  He was tachycardic into the 120s.  He was hemodynamically stable with oxygen saturation 93% on room air.  CTA chest was negative for pulmonary embolus.  CT abdomen and pelvis showed extensive edema surrounding the pancreas consistent with pancreatitis.  There was an area of decreased enhancement in the pancreatic tail consistent with pancreatic edema versus necrosis.  He is status post aortobiiliac bypass.  The patient was started on IV fluids and opioids for his pancreatitis.  Triglycerides were 709.  EKG showed sinus rhythm and nonspecific T wave changes.  Troponins were 6>> 6. ?His pain slowly improved during hospitalization and he was started on a diet. ? ?Interval History:  ?No events overnight.  Tolerated full liquids since yesterday.  Eager for going home if able today. ?Denies any nausea/vomiting.  Abdominal pain also continues to improve. ? ?Assessment and Plan: ?* Acute pancreatitis ?- Etiology still suspected due to hypertriglyceridemia.  Triglycerides 709 on admission (some incidence rates seen in patients  with TG <~850). Other workup pending for etiology per GI as well; follow up IgG 4 level ?- outpatient CT pancreatic protocol in 10-12 weeks recommended per GI ?- I agree, his age is higher than would be expected for primary hyperTG, but given his obesity and DMII not unreasonable to have a component of secondary hyperTG contributing to this presentation ?- Continue fluids ?- Tolerated CLD and advanced to FLD and soft diet; if tolerates, will d/c home ? ?Mixed hyperlipidemia ?- Initial TG 709; no insulin drip started however repeat TG improved to 420 after admission ?-Continue Crestor and fenofibrate ?- Added omega ?- outpatient referral to lipid clinic ? ?Uncontrolled type 2 diabetes mellitus with hyperglycemia, without long-term current use of insulin ([place]) ?- Holding metformin and glimepiride ?- 02/14/2022 hemoglobin A1c 8% ?- NovoLog sliding scale ?- starting low dose semglee which may also help with TG ? ?Chronic kidney disease, stage 3b ([place]) ?Baseline creatinine 1.5-1.8 ?Presented with serum creatinine 1.59 ? ?Coronary artery disease ?troponins 6>>6 ?CTA chest negative for PE ?Continue aspirin ? ?Hypertension ?Continue metoprolol tartrate ? ? ? ?Old records reviewed in assessment of this patient ? ?Antimicrobials: ? ? ?DVT prophylaxis:  ? ? ? ?Code Status:   Code Status: Full Code ? ?Disposition Plan: Home in 1 to 2 days ?Status is: Inpatient ? ?Objective: ?Blood pressure 99/85, pulse 69, temperature 97.8 ?F (36.6 ?C), resp. rate 16, height '5\' 9"'$  (1.753 m), weight 99.8 kg, SpO2 99 %.  ?Examination:  ?Physical Exam ?Constitutional:   ?   General: He is not  in acute distress. ?   Appearance: Normal appearance.  ?HENT:  ?   Head: Normocephalic and atraumatic.  ?   Mouth/Throat:  ?   Mouth: Mucous membranes are moist.  ?Eyes:  ?   Extraocular Movements: Extraocular movements intact.  ?Cardiovascular:  ?   Rate and Rhythm: Normal rate and regular rhythm.  ?   Heart sounds: Normal heart sounds.  ?Pulmonary:  ?    Effort: Pulmonary effort is normal. No respiratory distress.  ?   Breath sounds: Normal breath sounds. No wheezing.  ?Abdominal:  ?   General: Bowel sounds are normal. There is no distension.  ?   Palpations: Abdomen is soft.  ?   Comments: Obese. Mild nonspecific tenderness throughout with no rebound or guarding  ?Musculoskeletal:     ?   General: Normal range of motion.  ?   Cervical back: Normal range of motion and neck supple.  ?Skin: ?   General: Skin is warm and dry.  ?Neurological:  ?   General: No focal deficit present.  ?   Mental Status: He is alert.  ?Psychiatric:     ?   Mood and Affect: Mood normal.     ?   Behavior: Behavior normal.  ?  ? ?Consultants:  ?GI ? ?Procedures:  ? ? ?Data Reviewed: ?Results for orders placed or performed during the hospital encounter of 02/13/22 (from the past 24 hour(s))  ?Glucose, capillary     Status: Abnormal  ? Collection Time: 02/16/22  9:14 PM  ?Result Value Ref Range  ? Glucose-Capillary 169 (H) 70 - 99 mg/dL  ?Glucose, capillary     Status: Abnormal  ? Collection Time: 02/16/22 11:41 PM  ?Result Value Ref Range  ? Glucose-Capillary 183 (H) 70 - 99 mg/dL  ?Glucose, capillary     Status: Abnormal  ? Collection Time: 02/17/22  3:36 AM  ?Result Value Ref Range  ? Glucose-Capillary 192 (H) 70 - 99 mg/dL  ?Magnesium     Status: None  ? Collection Time: 02/17/22  4:17 AM  ?Result Value Ref Range  ? Magnesium 2.0 1.7 - 2.4 mg/dL  ?Comprehensive metabolic panel     Status: Abnormal  ? Collection Time: 02/17/22  4:17 AM  ?Result Value Ref Range  ? Sodium 133 (L) 135 - 145 mmol/L  ? Potassium 3.9 3.5 - 5.1 mmol/L  ? Chloride 99 98 - 111 mmol/L  ? CO2 25 22 - 32 mmol/L  ? Glucose, Bld 177 (H) 70 - 99 mg/dL  ? BUN 19 6 - 20 mg/dL  ? Creatinine, Ser 1.37 (H) 0.61 - 1.24 mg/dL  ? Calcium 8.5 (L) 8.9 - 10.3 mg/dL  ? Total Protein 5.9 (L) 6.5 - 8.1 g/dL  ? Albumin 2.6 (L) 3.5 - 5.0 g/dL  ? AST 14 (L) 15 - 41 U/L  ? ALT 18 0 - 44 U/L  ? Alkaline Phosphatase 62 38 - 126 U/L  ? Total  Bilirubin 0.7 0.3 - 1.2 mg/dL  ? GFR, Estimated 59 (L) >60 mL/min  ? Anion gap 9 5 - 15  ?CBC with Differential/Platelet     Status: Abnormal  ? Collection Time: 02/17/22  4:17 AM  ?Result Value Ref Range  ? WBC 7.9 4.0 - 10.5 K/uL  ? RBC 4.39 4.22 - 5.81 MIL/uL  ? Hemoglobin 12.7 (L) 13.0 - 17.0 g/dL  ? HCT 39.0 39.0 - 52.0 %  ? MCV 88.8 80.0 - 100.0 fL  ? MCH 28.9 26.0 - 34.0  pg  ? MCHC 32.6 30.0 - 36.0 g/dL  ? RDW 14.2 11.5 - 15.5 %  ? Platelets 111 (L) 150 - 400 K/uL  ? nRBC 0.0 0.0 - 0.2 %  ? Neutrophils Relative % 74 %  ? Neutro Abs 6.0 1.7 - 7.7 K/uL  ? Lymphocytes Relative 11 %  ? Lymphs Abs 0.8 0.7 - 4.0 K/uL  ? Monocytes Relative 11 %  ? Monocytes Absolute 0.9 0.1 - 1.0 K/uL  ? Eosinophils Relative 2 %  ? Eosinophils Absolute 0.1 0.0 - 0.5 K/uL  ? Basophils Relative 1 %  ? Basophils Absolute 0.0 0.0 - 0.1 K/uL  ? Immature Granulocytes 1 %  ? Abs Immature Granulocytes 0.06 0.00 - 0.07 K/uL  ?Glucose, capillary     Status: Abnormal  ? Collection Time: 02/17/22  7:23 AM  ?Result Value Ref Range  ? Glucose-Capillary 173 (H) 70 - 99 mg/dL  ?Glucose, capillary     Status: Abnormal  ? Collection Time: 02/17/22 11:06 AM  ?Result Value Ref Range  ? Glucose-Capillary 260 (H) 70 - 99 mg/dL  ?  ?I have Reviewed nursing notes, Vitals, and Lab results since pt's last encounter. Pertinent lab results : see above ?I have ordered test including BMP, CBC, Mg ?I have reviewed the last note from staff over past 24 hours ?I have discussed pt's care plan and test results with nursing staff, case manager ? ? LOS: 4 days  ? ?Clayton Dee, MD ?[facility] ?02/17/2022, 4:32 PM ? ?

## 2022-02-17 NOTE — Progress Notes (Signed)
? ?Gastroenterology Progress Note  ? ?Referring Provider: No ref. provider found ?Primary Care Physician:  [facility] ?Primary Gastroenterologist:  Dr. Abbey Chatters ? ?Patient ID: Clayton Peterson; [id number]; Dec 01, 1960  ? ? ?Subjective  ? ?Patient reports tolerating full liquids well.  Had a thicker almost like potato soup broth for lunch, has had grits, Jell-O and other liquids without much pain.  Has had minimal pain medication.  Denies any nausea/vomiting or diarrhea.  Still reports no bowel movement but he is passing gas.  He is eager to go home.  Is up for advancing his diet today. ? ?Objective  ? ?Vital signs in last 24 hours ?Temp:  [97.8 ?F (36.6 ?C)-99.9 ?F (37.7 ?C)] 97.8 ?F (36.6 ?C) (04/28 1228) ?Pulse Rate:  [69-77] 69 (04/28 1228) ?Resp:  [16-19] 16 (04/28 1228) ?BP: (99-145)/(71-85) 99/85 (04/28 1228) ?SpO2:  [91 %-99 %] 99 % (04/28 1228) ?Weight:  [99.8 kg] 99.8 kg (04/28 0530) ?Last BM Date : 02/12/22 ? ?Physical Exam ?General:   Alert and oriented, pleasant ?Head:  Normocephalic and atraumatic. ?Eyes:  No icterus, sclera clear. Conjuctiva pink.  ?Abdomen:  Bowel sounds present, soft, mild tenderness to lower abdomen, non-distended. No HSM or hernias noted. No rebound or guarding. No masses appreciated  ?Msk:  Symmetrical without gross deformities. Normal posture. ?Extremities:  Without clubbing or edema. ?Neurologic:  Alert and  oriented x4;  grossly normal neurologically. ?Psych:  Alert and cooperative. Normal mood and affect. ? ?Intake/Output from previous day: ?04/27 0701 - 04/28 0700 ?In: 720 [P.O.:720] ?Out: 1800 [Urine:1800] ?Intake/Output this shift: ?Total I/O ?In: 240 [P.O.:240] ?Out: 600 [Urine:600] ? ?Lab Results ? ?Recent Labs  ?  02/15/22 ?0406 02/16/22 ?0350 02/17/22 ?1448  ?WBC 12.6* 9.9 7.9  ?HGB 13.2 12.5* 12.7*  ?HCT 41.4 38.2* 39.0  ?PLT 100* 96* 111*  ? ?BMET ?Recent Labs  ?  02/15/22 ?0406 02/16/22 ?0350 02/17/22 ?1856  ?NA 133* 132* 133*  ?K 4.3 4.1 3.9  ?CL 102 100 99   ?CO2 '24 24 25  '$ ?GLUCOSE 192* 166* 177*  ?BUN '20 19 19  '$ ?CREATININE 1.48* 1.31* 1.37*  ?CALCIUM 8.3* 8.4* 8.5*  ? ?LFT ?Recent Labs  ?  02/15/22 ?0406 02/16/22 ?0350 02/17/22 ?3149  ?PROT 5.9* 5.8* 5.9*  ?ALBUMIN 2.9* 2.6* 2.6*  ?AST 14* 12* 14*  ?ALT '14 14 18  '$ ?ALKPHOS 52 50 62  ?BILITOT 0.7 0.6 0.7  ? ?PT/INR ?No results for input(s): LABPROT, INR in the last 72 hours. ?Hepatitis Panel ?No results for input(s): HEPBSAG, HCVAB, HEPAIGM, HEPBIGM in the last 72 hours. ? ?Studies/Results ?DG Chest 2 View ? ?Result Date: 02/13/2022 ?CLINICAL DATA:  Chest pain. EXAM: CHEST - 2 VIEW COMPARISON:  Chest x-ray 05/22/2021 FINDINGS: The heart size and mediastinal contours are within normal limits. Both lungs are clear. The visualized skeletal structures are unremarkable. IMPRESSION: No active cardiopulmonary disease. Electronically Signed   By: Ronney Asters M.D.   On: 02/13/2022 17:39  ? ?CT Angio Chest PE W and/or Wo Contrast ? ?Result Date: 02/13/2022 ?CLINICAL DATA:  Pulmonary embolism (PE) suspected, high prob. Upper abdominal pain, chest pain. Low O2 sats. EXAM: CT ANGIOGRAPHY CHEST WITH CONTRAST TECHNIQUE: Multidetector CT imaging of the chest was performed using the standard protocol during bolus administration of intravenous contrast. Multiplanar CT image reconstructions and MIPs were obtained to evaluate the vascular anatomy. RADIATION DOSE REDUCTION: This exam was performed according to the departmental dose-optimization program which includes automated exposure control, adjustment of the mA and/or kV according  to patient size and/or use of iterative reconstruction technique. CONTRAST:  192m OMNIPAQUE IOHEXOL 350 MG/ML SOLN COMPARISON:  05/22/2021 FINDINGS: Cardiovascular: No filling defects in the pulmonary arteries to suggest pulmonary emboli. Coronary artery and aortic calcifications. Heart is normal size. Aorta is normal caliber. Mediastinum/Nodes: No mediastinal, hilar, or axillary adenopathy. Trachea and  esophagus are unremarkable. Thyroid unremarkable. Lungs/Pleura: Dependent atelectasis. No additional confluent opacities or effusions. Upper Abdomen: See abdominal CT report Musculoskeletal: Chest wall soft tissues are unremarkable. No acute bony abnormality. Review of the MIP images confirms the above findings. IMPRESSION: No evidence of pulmonary embolus. Coronary artery disease. No acute cardiopulmonary disease. Aortic Atherosclerosis (ICD10-I70.0). Electronically Signed   By: KRolm BaptiseM.D.   On: 02/13/2022 19:37  ? ?CT ABDOMEN PELVIS W CONTRAST ? ?Result Date: 02/13/2022 ?CLINICAL DATA:  Upper abdominal pain, chest pain EXAM: CT ABDOMEN AND PELVIS WITH CONTRAST TECHNIQUE: Multidetector CT imaging of the abdomen and pelvis was performed using the standard protocol following bolus administration of intravenous contrast. RADIATION DOSE REDUCTION: This exam was performed according to the departmental dose-optimization program which includes automated exposure control, adjustment of the mA and/or kV according to patient size and/or use of iterative reconstruction technique. CONTRAST:  10107mOMNIPAQUE IOHEXOL 350 MG/ML SOLN COMPARISON:  05/22/2021 FINDINGS: Lower chest: See chest CT report Hepatobiliary: Diffuse low-density throughout the liver compatible with fatty infiltration. Gallbladder unremarkable. Pancreas: Extensive edema surrounding the pancreas compatible with acute pancreatitis. Decreased enhancement within the pancreatic tail likely reflects edema. This could reflect an area of pancreatic necrosis. Recommend attention on follow-up imaging. No ductal dilatation. Spleen: No focal abnormality.  Normal size. Adrenals/Urinary Tract: Small scattered left renal stones. No hydronephrosis. Urinary bladder and adrenal glands unremarkable. Mild atrophy and cortical thinning in the right kidney. Stomach/Bowel: Stomach, large and small bowel grossly unremarkable. Vascular/Lymphatic: Prior aorto bi-iliac bypass.  Aneurysmal dilatation of the abdominal aorta above the bypass measuring up to 4.3 cm, stable since prior study. No adenopathy Reproductive: No visible focal abnormality. Other: No free fluid or free air. Musculoskeletal: No acute bony abnormality. IMPRESSION: Changes of acute pancreatitis with edema/stranding surrounding the pancreas and tracking in the left anterior pararenal space. Decreased enhancement within the pancreatic tail could reflect edema or pancreatic necrosis. Recommend attention on follow-up imaging. Diffuse fatty infiltration of the liver. 4.3 cm abdominal aortic aneurysm.  Prior aorto bi-iliac bypass. Electronically Signed   By: KeRolm Baptise.D.   On: 02/13/2022 19:42  ? ?USKoreabdomen Limited RUQ (LIVER/GB) ? ?Result Date: 02/14/2022 ?CLINICAL DATA:  Abnormal LFTs EXAM: ULTRASOUND ABDOMEN LIMITED RIGHT UPPER QUADRANT COMPARISON:  None. FINDINGS: Gallbladder: No gallstones or wall thickening visualized. No sonographic Murphy sign noted by sonographer. Common bile duct: Not clearly visualized. Liver: No focal lesion identified. Severely increased parenchymal echogenicity. Portal vein is patent on color Doppler imaging with normal direction of blood flow towards the liver. Other: None. IMPRESSION: 1.  Severe hepatic steatosis. 2.  The common bile duct is nonvisualized. 3.  No ultrasound abnormality of the gallbladder. Electronically Signed   By: AlDelanna Ahmadi.D.   On: 02/14/2022 12:13   ? ?Assessment  ?[age].o. male with a history of HTN, type 2 diabetes, HLD, AAA s/p aortobiiliac bypass, adenomatous polyps, gastritis, duodenal ulcer in the setting of NSAIDs in August 2022.  He presented to the ED on 4/24 with epigastric pain associated nausea vomiting for 2 days. ? ?Acute pancreatitis with possible pancreatic necrosis/hypertriglyceridemia: CT A/P with IV contrast showed changes consistent with acute pancreatitis with  presence of decreased enhancement of the pancreatic tail (edema versus focal  necrosis), and presence of fatty liver.  Triglycerides 709 on admission and improved to 420.  Pancreatitis could possibly be secondary to hypertriglyceridemia however this is usually and values >1000.  This could also

## 2022-02-17 NOTE — Progress Notes (Signed)
Inpatient Diabetes Program Recommendations ? ?AACE/ADA: New Consensus Statement on Inpatient Glycemic Control  ? ?Target Ranges:  Prepandial:   less than 140 mg/dL ?     Peak postprandial:   less than 180 mg/dL (1-2 hours) ?     Critically ill patients:  140 - 180 mg/dL  ? ? Latest Reference Range & Units 02/17/22 03:36 02/17/22 07:23 02/17/22 11:06  ?Glucose-Capillary 70 - 99 mg/dL 192 (H) 173 (H) 260 (H)  ? ?Review of Glycemic Control ? ?Diabetes history: DM2 ?Outpatient Diabetes medications: Amaryl 4 mg BID, Metformin 1000 mg BID ?Current orders for Inpatient glycemic control: Semglee 5 units daily, Novolog 0-6 units Q4H ? ?Inpatient Diabetes Program Recommendations:   ? ?Insulin: Please consider increasing Semglee to 7 units daily ? ?Thanks ?Barnie Alderman, RN, MSN, CDE ?Diabetes Coordinator ?Inpatient Diabetes Program ?[phone] (Team Pager from 8am to 5pm) ? ? ?

## 2022-02-17 NOTE — Telephone Encounter (Signed)
Clinical pool - Patient needs CT with pancreatic protocol in 10 to 12 weeks.  He was just seen in the hospital for pancreatitis with concern for necrosis. ? ?Venetia Night, MSN, FNP-BC, AGACNP-BC ?[facility] ? ?

## 2022-02-20 LAB — IGG 4: IgG, Subclass 4: 17 mg/dL (ref 2–96)

## 2022-02-21 NOTE — Telephone Encounter (Signed)
On recall  °

## 2022-02-22 ENCOUNTER — Telehealth: Payer: Self-pay | Admitting: Gastroenterology

## 2022-02-22 NOTE — Telephone Encounter (Signed)
Patient needs to speak to a nurse about medications  ?

## 2022-02-22 NOTE — Telephone Encounter (Signed)
Dr. Abbey Chatters  ?When this pt was in the hospital Dr. Jenetta Downer took him off of his PPI medication due to acute pancreatitis and forgot to send in something else for the pt to use. Please send in a Rx for the pt because he is complaining of heartburn ?

## 2022-02-23 ENCOUNTER — Telehealth: Payer: Self-pay | Admitting: Internal Medicine

## 2022-02-23 MED ORDER — PANTOPRAZOLE SODIUM 40 MG PO TBEC: 40.0000 mg | DELAYED_RELEASE_TABLET | Freq: Every day | ORAL | 3 refills | Status: DC

## 2022-02-23 NOTE — Telephone Encounter (Signed)
Phoned and advised the pt's spouse that the pt's Rx was sent to the pharmacy ?

## 2022-02-23 NOTE — Telephone Encounter (Signed)
Pantoprazole 40 mg daily sent to patient's pharmacy. ?

## 2022-03-09 ENCOUNTER — Encounter: Payer: Self-pay | Admitting: Internal Medicine

## 2022-03-09 ENCOUNTER — Ambulatory Visit (INDEPENDENT_AMBULATORY_CARE_PROVIDER_SITE_OTHER): Payer: Medicare Other | Admitting: Internal Medicine

## 2022-03-09 VITALS — BP 122/78 | HR 55 | Ht 69.0 in | Wt 210.0 lb

## 2022-03-09 DIAGNOSIS — Z9889 Other specified postprocedural states: Secondary | ICD-10-CM

## 2022-03-09 DIAGNOSIS — Z955 Presence of coronary angioplasty implant and graft: Secondary | ICD-10-CM

## 2022-03-09 DIAGNOSIS — Z8719 Personal history of other diseases of the digestive system: Secondary | ICD-10-CM

## 2022-03-09 DIAGNOSIS — I10 Essential (primary) hypertension: Secondary | ICD-10-CM

## 2022-03-09 DIAGNOSIS — I2510 Atherosclerotic heart disease of native coronary artery without angina pectoris: Secondary | ICD-10-CM

## 2022-03-09 DIAGNOSIS — E782 Mixed hyperlipidemia: Secondary | ICD-10-CM | POA: Diagnosis not present

## 2022-03-09 DIAGNOSIS — Z79899 Other long term (current) drug therapy: Secondary | ICD-10-CM

## 2022-03-09 NOTE — Progress Notes (Signed)
LIPID CLINIC CONSULT NOTE  Chief Complaint:  Manage dyslipidemia  Primary Care Physician: [facility]  Primary Cardiologist:  None  HPI:  Clayton Peterson is a 61 y.o. male who is being seen today for the evaluation of dyslipidemia at the request of [facility]. This is a pleasant 61 year old male who was referred for evaluation management of dyslipidemia.  He does have a history of heart disease including a prior heart attack in 2012 at which time he was found to have coronary artery disease including stenosis in the mid RCA and underwent a vision MultiLink stent (3.0 x 28 mm).  Prior to that, he underwent open repair of a AAA which apparently burst on the table as they were planning to repair it via a stent graft.  He also has type 2 diabetes, hypertension, and family history of early onset heart disease.  More recently he had an episode of pancreatitis and at that time was noted to have very high triglycerides of 709.  After additional therapy, labs 3 weeks ago showed total cholesterol 102, triglycerides 420, HDL 18 and LDL 26.  Currently is asymptomatic denying chest pain worsening shortness of breath or abdominal pain.  He reports a varied diet and work to try and reduce sugars and saturated fats.  PMHx:  Past Medical History:  Diagnosis Date   Coronary artery disease    Diabetes mellitus without complication ([place])    Heart attack ([place])    High cholesterol    Hypertension     Past Surgical History:  Procedure Laterality Date   ABDOMINAL AORTIC ANEURYSM REPAIR     BACK SURGERY     x2   BIOPSY  06/03/2021   Procedure: BIOPSY;  Surgeon: Eloise Harman, DO;  [facility];  Service: Endoscopy;;   CARDIAC CATHETERIZATION     stent placed on right side ofo heart   COLONOSCOPY WITH PROPOFOL N/A 06/03/2021   Surgeon: Eloise Harman, DO; Nonbleeding internal hemorrhoids, 6 mm polyp in sigmoid colon, 2 mm polyp in the ascending colon,  congested and erythematous mucosa in the cecum biopsied.  Pathology revealed 2 tubular adenomas, cecal biopsy with polypoid colonic mucosa with ectatic vascular channels with small vascular malformation not ruled out. Repeat in 5 years.   ESOPHAGOGASTRODUODENOSCOPY (EGD) WITH PROPOFOL N/A 06/03/2021   Surgeon: Eloise Harman, DO; Gastritis biopsied, nonobstructing, nonbleeding duodenal ulcer with no stigmata of bleeding, NSAID induced etiology.  Pathology with chronic gastritis, negative for H. pylori.   KNEE SURGERY Left    PARTIAL COLECTOMY     POLYPECTOMY  06/03/2021   Procedure: POLYPECTOMY INTESTINAL;  Surgeon: Eloise Harman, DO;  [facility];  Service: Endoscopy;;    FAMHx:  Family History  Problem Relation Age of Onset   Pancreatitis Neg Hx     SOCHx:   reports that he has been smoking cigarettes. He has been smoking an average of 1 pack per day. He has never been exposed to tobacco smoke. He has never used smokeless tobacco. He reports current alcohol use. He reports that he does not use drugs.  ALLERGIES:  Allergies  Allergen Reactions   Bee Venom Anaphylaxis   Coconut (Cocos Nucifera) Anaphylaxis    ROS: Pertinent items noted in HPI and remainder of comprehensive ROS otherwise negative.  HOME MEDS: Current Outpatient Medications on File Prior to Visit  Medication Sig Dispense Refill   acetaminophen (TYLENOL) 325 MG tablet Take 325 mg by mouth  every 4 (four) hours as needed for pain.     aspirin 81 MG EC tablet Take 81 mg by mouth daily.     Cholecalciferol (VITAMIN D3) 125 MCG (5000 UT) TABS Take 1 tablet by mouth daily.     cyclobenzaprine (FLEXERIL) 5 MG tablet Take 5 mg by mouth every 8 (eight) hours as needed.     fenofibrate 160 MG tablet Take 1 tablet (160 mg total) by mouth daily. 30 tablet 3   gabapentin (NEURONTIN) 100 MG capsule Take 100 mg by mouth daily.     glimepiride (AMARYL) 4 MG tablet Take 4 mg by mouth 2 (two) times daily.      lisinopril (ZESTRIL) 20 MG tablet Take 20 mg by mouth daily.     metFORMIN (GLUCOPHAGE) 1000 MG tablet Take 1,000 mg by mouth 2 (two) times daily.     metoprolol tartrate (LOPRESSOR) 50 MG tablet Take 50 mg by mouth 2 (two) times daily.     nitroGLYCERIN (NITROSTAT) 0.4 MG SL tablet Place 0.4 mg under the tongue every 5 (five) minutes as needed for chest pain.     omega-3 acid ethyl esters (LOVAZA) 1 g capsule Take 2 capsules (2 g total) by mouth 2 (two) times daily. 120 capsule 3   pantoprazole (PROTONIX) 40 MG tablet Take 1 tablet (40 mg total) by mouth daily. 90 tablet 3   rosuvastatin (CRESTOR) 40 MG tablet Take 20 mg by mouth daily.     No current facility-administered medications on file prior to visit.    LABS/IMAGING: No results found for this or any previous visit (from the past 48 hour(s)). No results found.  LIPID PANEL:    Component Value Date/Time   CHOL 102 02/15/2022 0406   TRIG 420 (H) 02/15/2022 0406   HDL 18 (L) 02/15/2022 0406   CHOLHDL 5.7 02/15/2022 0406   VLDL UNABLE TO CALCULATE IF TRIGLYCERIDE OVER 400 mg/dL 02/15/2022 0406   LDLCALC UNABLE TO CALCULATE IF TRIGLYCERIDE OVER 400 mg/dL 02/15/2022 0406   LDLDIRECT 26.0 02/15/2022 0406    WEIGHTS: Wt Readings from Last 3 Encounters:  03/09/22 210 lb (95.3 kg)  02/17/22 220 lb 0.3 oz (99.8 kg)  06/03/21 216 lb (98 kg)    VITALS: BP 122/78   Pulse (!) 55   Ht '5\' 9"'$  (1.753 m)   Wt 210 lb (95.3 kg)   SpO2 98%   BMI 31.01 kg/m   EXAM: General appearance: alert and no distress Neck: no carotid bruit, no JVD, and thyroid not enlarged, symmetric, no tenderness/mass/nodules Lungs: clear to auscultation bilaterally Heart: regular rate and rhythm, S1, S2 normal, no murmur, click, rub or gallop Abdomen: soft, non-tender; bowel sounds normal; no masses,  no organomegaly and midline abdominal scar Extremities: extremities normal, atraumatic, no cyanosis or edema Pulses: 2+ and symmetric Skin: Skin color,  texture, turgor normal. 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   Mr. 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  He also had a ruptured AAA which has not been reassessed since  his surgery.  He is asymptomatic from a cardiac standpoint.  I would recommend CT angiogram of the aorta, chest abdomen and pelvis to evaluate his AAA.  He currently is on a good regimen of medications including a recent increase in his fenofibrate up to 160 mg daily, he takes Lovaza 2 g twice daily and is on statin 40 mg daily.  His LDL is low which is not necessarily an issue however his HDL is also very low which is also worsened by high intensity statin therapy.  I would advise that we actually decrease his rosuvastatin from 40 to 20 mg daily to allow little higher HDL cholesterol and he will still have adequate LDL control.  Additionally, I would like to investigate a clinical trial for him.  He may qualify for the ESSENCE trial.  We will contact him with follow-up results of his CT scan and plan to reassess lipids and follow-up in about 3 to 4 months.  Clayton Casino, MD, [facility], [place] Director of the Advanced Lipid Disorders &  Cardiovascular Risk Reduction Clinic Diplomate of the American Board of Clinical Lipidology Attending Cardiologist  Direct Dial: [phone]  Fax: [phone]  Website:  [email or web address] 03/09/2022, 10:22 PM

## 2022-03-09 NOTE — Patient Instructions (Signed)
Medication Instructions:  REDUCE crestor to '20mg'$  daily   *If you need a refill on your cardiac medications before your next appointment, please call your pharmacy*   Lab Work: Non-Fasting BMET prior to CT test  FASTING NMR lipoprofile, direct LDL about a week before your next appointment with Dr. Debara Pickett -- in [place]   If you have labs (blood work) drawn today and your tests are completely normal, you will receive your results only by: [place] (if you have MyChart) OR A paper copy in the mail If you have any lab test that is abnormal or we need to change your treatment, we will call you to review the results.   Testing/Procedures: CT test to look at aorta -- [name]   Follow-Up: At [facility], you and your health needs are our priority.  As part of our continuing mission to provide you with exceptional heart care, we have created designated Provider Care Teams.  These Care Teams include your primary Cardiologist (physician) and Advanced Practice Providers (APPs -  Physician Assistants and Nurse Practitioners) who all work together to provide you with the care you need, when you need it.  We recommend signing up for the patient portal called "MyChart".  Sign up information is provided on this After Visit Summary.  MyChart is used to connect with patients for Virtual Visits (Telemedicine).  Patients are able to view lab/test results, encounter notes, upcoming appointments, etc.  Non-urgent messages can be sent to your provider as well.   To learn more about what you can do with MyChart, go to [email or web address].    Your next appointment:   3-4 months with Dr. Debara Pickett -- [name] location   Other Instructions Dr. Debara Pickett is going to refer you for research - clinical trial

## 2022-03-14 ENCOUNTER — Encounter: Payer: Self-pay | Admitting: *Deleted

## 2022-03-14 DIAGNOSIS — Z006 Encounter for examination for normal comparison and control in clinical research program: Secondary | ICD-10-CM

## 2022-03-14 NOTE — Research (Signed)
Spoke to Clayton Peterson about [employer] study. She states she will have Mr Bracco call me back later.

## 2022-03-29 ENCOUNTER — Encounter: Payer: Self-pay | Admitting: *Deleted

## 2022-03-29 DIAGNOSIS — Z006 Encounter for examination for normal comparison and control in clinical research program: Secondary | ICD-10-CM

## 2022-03-29 NOTE — Research (Signed)
Spoke to Mrs [employer] about Designer, multimedia. She states she doesn't think he will be interested, but she will have him call me back to discuss it with him.

## 2022-04-18 ENCOUNTER — Telehealth: Payer: Self-pay | Admitting: Internal Medicine

## 2022-04-18 NOTE — Telephone Encounter (Signed)
Recall sent 

## 2022-04-18 NOTE — Telephone Encounter (Signed)
PATIENT ON RECALL FOR CT PANCREATIC PROTOCOL

## 2022-04-26 NOTE — Telephone Encounter (Signed)
Spoke with Anderson Malta in [place] who stated this imaging will not be the same/appropriate for follow-up on his pancreatic lesion. However, that being said, if he wants to put off his pancreatic protocol CT off for a couple of months, we can do so as this is just a routine follow-up CT.

## 2022-04-26 NOTE — Telephone Encounter (Signed)
Called pt and spoke with spouse. They are fine with it being done in a couple months.

## 2022-04-26 NOTE — Telephone Encounter (Signed)
Pt is scheduled for CTA dissection chest/abd/pelvis w/ w/o and gating on  7/11 ordered by Dr. Debara Pickett. Does he still need the CT pancreatic protocol?

## 2022-05-02 ENCOUNTER — Ambulatory Visit (HOSPITAL_COMMUNITY)
Admission: RE | Admit: 2022-05-02 | Discharge: 2022-05-02 | Disposition: A | Discharge status: Home / Self Care | Payer: Medicare Other | Source: Ambulatory Visit | Attending: Internal Medicine | Admitting: Internal Medicine

## 2022-05-02 DIAGNOSIS — Z9889 Other specified postprocedural states: Secondary | ICD-10-CM | POA: Insufficient documentation

## 2022-05-02 MED ORDER — IOHEXOL 350 MG/ML SOLN
80.0000 mL | Freq: Once | INTRAVENOUS | Status: AC | PRN
  Administered 2022-05-02: 80 mL via INTRAVENOUS

## 2022-05-03 ENCOUNTER — Encounter: Payer: Self-pay | Admitting: Gastroenterology

## 2022-05-03 ENCOUNTER — Ambulatory Visit (INDEPENDENT_AMBULATORY_CARE_PROVIDER_SITE_OTHER): Payer: Medicare Other | Admitting: Gastroenterology

## 2022-05-03 VITALS — BP 126/76 | HR 48 | Temp 97.7°F | Ht 69.0 in | Wt 210.8 lb

## 2022-05-03 DIAGNOSIS — Z8719 Personal history of other diseases of the digestive system: Secondary | ICD-10-CM | POA: Insufficient documentation

## 2022-05-03 DIAGNOSIS — R12 Heartburn: Secondary | ICD-10-CM | POA: Insufficient documentation

## 2022-05-03 DIAGNOSIS — I2510 Atherosclerotic heart disease of native coronary artery without angina pectoris: Secondary | ICD-10-CM

## 2022-05-03 NOTE — Progress Notes (Signed)
Referring Provider: Alanson Puls [facility] Primary Care Physician:  [facility] Primary GI Physician: Dr. Abbey Chatters  Chief Complaint  Patient presents with   Follow-up    No issues, doing well.    HPI:   Clayton Peterson is a 61 y.o. male with history of HTN, type 2 diabetes, HLD, AAA s/p aortobiiliac bypass, adenomatous colon polyps, GERD, gastritis, duodenal ulcer in the setting of NSAIDs in August 2022, presenting today for hospital follow-up of pancreatitis.   He was admitted to [facility] 02/13/2022 - 02/17/2022 with acute pancreatitis.  CT A/P with contrast with extensive edema surrounding the pancreas consistent with pancreatitis, area of decreased enhancement in the pancreatic tail consistent with pancreatic edema versus necrosis.  Ultrasound with no evidence of gallstones or biliary dilation.  LFTs within normal limits.  No hypercalcemia.  Reported rare alcohol use.  Denied new medications.  Chronically on omeprazole which is a class II medication with potential to cause pancreatitis. Triglycerides elevated at 709.  IgG4 normal.  Etiology of his acute pancreatitis was unclear, unable to rule out pancreatitis secondary to hypertriglyceridemia, though this usually presents with triglycerides greater than 1000 and a younger age.  Chronic use of omeprazole also considered and he was switched to pantoprazole.  Ultimately, he was treated supportively with clinical improvement.  Recommended follow-up CT with pancreatic protocol in the next couple of months to follow-up and rule out occult malignancy. He was started on fenofibrate for hypertriglyceridemia, but encouraged endocrine follow-up also encouraged endocrine follow-up as this also carries potential risk for pancreatitis.   Patient saw cardiology on 03/09/2022 for management of dyslipidemia.  He was doing well at the time of his visit.  Recommended CT angiogram of the aorta, chest, abdomen, pelvis to follow-up on his  prior AAA.  It was felt that he was on a good regimen of medications including a recent increase in his fenofibrate, up to 160 mg daily, Lovaza 2 g twice daily, and statin 40 mg daily.  Due to low HDL, recommended decreasing rosuvastatin from 40 to 20 mg daily.  Additionally, plan to investigate a clinical trial for him, ESSENCE trial.  Today:  Doing well overall. Rare abdominal pain, more of an intermittent bloating, but nothing routine and not bothersome. Denies nausea, vomiting. Weight is down 10 lbs since hospitalization but stable since mid May. Eating well. Trying to watch what he is eating. GERD is well controlled on Pantoprazole 40 mg daily. No dysphagia.   Bowels moving well. Denies brbpr or melena.   Sugar is staying above 150 despite recently starting insulin in June. Prior to pancreatitis, he didn't have as much trouble with his diabetes.   Alcohol: Rare Tobacco use: Daily, pack a day.  NSAIDs: None aside from 81 mg aspirin.  No Fhx of pancreatic cancer or pancreatitis.   States he is not getting involved in clinical trial with cardiology.  Past Medical History:  Diagnosis Date   Coronary artery disease    Diabetes mellitus without complication ([place])    Heart attack ([place])    High cholesterol    Hypertension     Past Surgical History:  Procedure Laterality Date   ABDOMINAL AORTIC ANEURYSM REPAIR     BACK SURGERY     x2   BIOPSY  06/03/2021   Procedure: BIOPSY;  Surgeon: Eloise Harman, DO;  [facility];  Service: Endoscopy;;   CARDIAC CATHETERIZATION     stent placed on right side ofo heart  COLONOSCOPY WITH PROPOFOL N/A 06/03/2021   Surgeon: Hurshel Keys K, DO; Nonbleeding internal hemorrhoids, 6 mm polyp in sigmoid colon, 2 mm polyp in the ascending colon, congested and erythematous mucosa in the cecum biopsied.  Pathology revealed 2 tubular adenomas, cecal biopsy with polypoid colonic mucosa with ectatic vascular channels with small vascular  malformation not ruled out. Repeat in 5 years.   ESOPHAGOGASTRODUODENOSCOPY (EGD) WITH PROPOFOL N/A 06/03/2021   Surgeon: Eloise Harman, DO; Gastritis biopsied, nonobstructing, nonbleeding duodenal ulcer with no stigmata of bleeding, NSAID induced etiology.  Pathology with chronic gastritis, negative for H. pylori.   KNEE SURGERY Left    PARTIAL COLECTOMY     POLYPECTOMY  06/03/2021   Procedure: POLYPECTOMY INTESTINAL;  Surgeon: Eloise Harman, DO;  [facility];  Service: Endoscopy;;    Current Outpatient Medications  Medication Sig Dispense Refill   acetaminophen (TYLENOL) 325 MG tablet Take 325 mg by mouth every 4 (four) hours as needed for pain.     aspirin 81 MG EC tablet Take 81 mg by mouth daily.     Cholecalciferol (VITAMIN D3) 125 MCG (5000 UT) TABS Take 1 tablet by mouth daily.     cyclobenzaprine (FLEXERIL) 5 MG tablet Take 5 mg by mouth every 8 (eight) hours as needed.     fenofibrate 160 MG tablet Take 1 tablet (160 mg total) by mouth daily. 30 tablet 3   gabapentin (NEURONTIN) 100 MG capsule Take 100 mg by mouth daily.     glimepiride (AMARYL) 4 MG tablet Take 4 mg by mouth 2 (two) times daily.     insulin glargine-yfgn (SEMGLEE) 100 UNIT/ML Pen SMARTSIG:10 Unit(s) SUB-Q Every Evening     lisinopril (ZESTRIL) 20 MG tablet Take 20 mg by mouth daily.     metFORMIN (GLUCOPHAGE) 1000 MG tablet Take 1,000 mg by mouth 2 (two) times daily.     metoprolol tartrate (LOPRESSOR) 50 MG tablet Take 50 mg by mouth 2 (two) times daily.     nitroGLYCERIN (NITROSTAT) 0.4 MG SL tablet Place 0.4 mg under the tongue every 5 (five) minutes as needed for chest pain.     omega-3 acid ethyl esters (LOVAZA) 1 g capsule Take 2 capsules (2 g total) by mouth 2 (two) times daily. 120 capsule 3   pantoprazole (PROTONIX) 40 MG tablet Take 1 tablet (40 mg total) by mouth daily. 90 tablet 3   rosuvastatin (CRESTOR) 40 MG tablet Take 20 mg by mouth daily.     No current facility-administered  medications for this visit.    Allergies as of 05/03/2022 - Review Complete 05/03/2022  Allergen Reaction Noted   Bee venom Anaphylaxis 05/22/2021   Coconut (cocos nucifera) Anaphylaxis 05/22/2021    Family History  Problem Relation Age of Onset   Pancreatitis Neg Hx    Pancreatic cancer Neg Hx    Colon cancer Neg Hx     Social History   Socioeconomic History   Marital status: Married    Spouse name: Not on file   Number of children: Not on file   Years of education: Not on file   Highest education level: Not on file  Occupational History   Not on file  Tobacco Use   Smoking status: Every Day    Packs/day: 1.00    Types: Cigarettes    Passive exposure: Never   Smokeless tobacco: Never  Vaping Use   Vaping Use: Never used  Substance and Sexual Activity   Alcohol use: Yes  Comment: very seldom   Drug use: Never   Sexual activity: Yes  Other Topics Concern   Not on file  Social History Narrative   Not on file   Social Determinants of Health   Financial Resource Strain: Not on file  Food Insecurity: Not on file  Transportation Needs: Not on file  Physical Activity: Not on file  Stress: Not on file  Social Connections: Not on file    Review of Systems: Gen: Denies fever, chills, cold or flu like symptoms, pre-syncope, or syncope.  CV: Denies chest pain, palpitations. Resp: Denies dyspnea, cough.  GI: See HPI Heme: See HPI  Physical Exam: BP 126/76 (BP Location: Right Arm, Patient Position: Sitting, Cuff Size: Normal)   Pulse (!) 48   Temp 97.7 F (36.5 C) (Temporal)   Ht '5\' 9"'$  (1.753 m)   Wt 210 lb 12.8 oz (95.6 kg)   SpO2 95%   BMI 31.13 kg/m  General:   Alert and oriented. No distress noted. Pleasant and cooperative.  Head:  Normocephalic and atraumatic. Eyes:  Conjuctiva clear without scleral icterus. Heart:  S1, S2 present without murmurs appreciated. Lungs:  Clear to auscultation bilaterally. No wheezes, rales, or rhonchi. No distress.   Abdomen:  +BS, soft, non-tender and non-distended. No rebound or guarding. No HSM or masses noted. Msk:  Symmetrical without gross deformities. Normal posture. Extremities:  Without edema. Neurologic:  Alert and  oriented x4 Psych:  Normal mood and affect.    Assessment:  61 y.o. male with history of HTN, type 2 diabetes, HLD, AAA s/p aortobiiliac bypass, adenomatous colon polyps, GERD, gastritis, duodenal ulcer in the setting of NSAIDs in August 2022, presenting today for hospital follow-up of acute pancreatitis.   History of acute pancreatitis:  Admitted in April 2023 with acute pancreatitis. CT A/P with contrast with extensive edema surrounding the pancreas consistent with pancreatitis, area of decreased enhancement in the pancreatic tail consistent with pancreatic edema versus necrosis.  Ultrasound with no evidence of gallstones or biliary dilation.  LFTs within normal limits.  No hypercalcemia.  Rare alcohol use.  Denied new medications.  Chronically on omeprazole which is a class II medication with potential to cause pancreatitis, thus switched to pantoprazole. Triglycerides elevated at 709 and started on fenofibrate. IgG4 normal. He was treated supportively with clinical improvement.  Clinically, he continues to do well.  No alarm symptoms.  No family history of pancreatitis or pancreatic cancer.  Etiology is unclear. Possibly secondary to hypertriglyceridemia, omeprazole, but unable to rule out occult malignancy.  We also discussed the link between chronic tobacco use and pancreatitis.  Encouraged working towards smoking cessation.  Notably, fenofibrate also carries potential risk for pancreatitis as well, but he needs his hypertriglyceridemia managed and medication options are limited.  Advised that he can discuss this further with cardiology who is now managing his dyslipidemia.  Ultimately, he needs dedicated imaging of his pancreas with pancreatic protocol CT which we will arrange in the  coming weeks.  Heartburn: Well-controlled on pantoprazole 40 mg daily.  No alarm symptoms.   Plan:  Continue pantoprazole 40 mg daily 30 minutes before breakfast. Reinforced GERD diet/lifestyle. Plan for CT with pancreatic protocol in early August. Discussed need for working towards smoking cessation.  Advised that he can discuss triglyceride management with cardiology to see if there are any other options aside from fenofibrate for him though I feel overall risk of pancreatitis from fenofibrate is low in his case. Follow-up in 6 months or sooner if  needed.    Aliene Altes, PA-C [facility] 05/03/2022

## 2022-05-03 NOTE — Patient Instructions (Addendum)
Continue taking pantoprazole 40 mg daily 30 minutes before breakfast.  Follow a GERD diet:  Avoid fried, fatty, greasy, spicy, citrus foods. Avoid caffeine and carbonated beverages. Avoid chocolate. Try eating 4-6 small meals a day rather than 3 large meals. Do not eat within 3 hours of laying down. Prop head of bed up on wood or bricks to create a 6 inch incline.  We will arrange to have a CT scan of your pancreas in early August.  Follow-up with your primary care doctor on management of your diabetes.  We will plan to follow-up with you in 6 months.  Do not hesitate to call sooner if you have any questions or concerns prior to your next visit.  It was great to see you again today!  I am glad you are doing well!  Aliene Altes, PA-C [facility]

## 2022-05-08 LAB — POCT I-STAT CREATININE: Creatinine, Ser: 1.8 mg/dL — ABNORMAL HIGH (ref 0.61–1.24)

## 2022-05-26 LAB — NMR, LIPOPROFILE
Cholesterol, Total: 134 mg/dL (ref 100–199)
HDL Particle Number: 23 umol/L — ABNORMAL LOW (ref >=30.5)
HDL-C: 27 mg/dL — ABNORMAL LOW (ref >39)
LDL Particle Number: 992 nmol/L (ref <1000)
LDL Size: 19.7 nm — ABNORMAL LOW (ref >20.5)
LDL-C (NIH Calc): 59 mg/dL (ref 0–99)
LP-IR Score: 71 — ABNORMAL HIGH (ref <=45)
Small LDL Particle Number: 804 nmol/L — ABNORMAL HIGH (ref <=527)
Triglycerides: 305 mg/dL — ABNORMAL HIGH (ref 0–149)

## 2022-05-26 LAB — LDL CHOLESTEROL, DIRECT: LDL Direct: 64 mg/dL (ref 0–99)

## 2022-05-29 ENCOUNTER — Encounter: Payer: Self-pay | Admitting: Internal Medicine

## 2022-05-29 ENCOUNTER — Ambulatory Visit (INDEPENDENT_AMBULATORY_CARE_PROVIDER_SITE_OTHER): Payer: Medicare Other | Admitting: Internal Medicine

## 2022-05-29 VITALS — BP 108/70 | HR 60 | Ht 69.0 in | Wt 211.6 lb

## 2022-05-29 DIAGNOSIS — I10 Essential (primary) hypertension: Secondary | ICD-10-CM

## 2022-05-29 DIAGNOSIS — Z9889 Other specified postprocedural states: Secondary | ICD-10-CM

## 2022-05-29 DIAGNOSIS — Z8719 Personal history of other diseases of the digestive system: Secondary | ICD-10-CM | POA: Diagnosis not present

## 2022-05-29 DIAGNOSIS — E782 Mixed hyperlipidemia: Secondary | ICD-10-CM | POA: Diagnosis not present

## 2022-05-29 DIAGNOSIS — I2510 Atherosclerotic heart disease of native coronary artery without angina pectoris: Secondary | ICD-10-CM

## 2022-05-29 NOTE — Progress Notes (Signed)
LIPID CLINIC CONSULT NOTE  Chief Complaint:  Follow-up dyslipidemia  Primary Care Physician: [facility]  Primary Cardiologist:  None  HPI:  Clayton Peterson is a 61 y.o. male who is being seen today for the evaluation of dyslipidemia at the request of [facility]. This is a pleasant 61 year old male who was referred for evaluation management of dyslipidemia.  He does have a history of heart disease including a prior heart attack in 2012 at which time he was found to have coronary artery disease including stenosis in the mid RCA and underwent a vision MultiLink stent (3.0 x 28 mm).  Prior to that, he underwent open repair of a AAA which apparently burst on the table as they were planning to repair it via a stent graft.  He also has type 2 diabetes, hypertension, and family history of early onset heart disease.  More recently he had an episode of pancreatitis and at that time was noted to have very high triglycerides of 709.  After additional therapy, labs 3 weeks ago showed total cholesterol 102, triglycerides 420, HDL 18 and LDL 26.  Currently is asymptomatic denying chest pain worsening shortness of breath or abdominal pain.  He reports a varied diet and work to try and reduce sugars and saturated fats.  05/29/2022  Clayton Peterson returns today for follow-up of dyslipidemia.  Lipids do show improvement after making adjustment in his meds.  LDL particle numbers now 992, LDL-C of 59, HDL-C of 27, and triglycerides 305 (down from 420 and previously 709).  He did undergo CT that I ordered to follow-up on AAA repair which was performed emergently back in 2009.  This shows aneurysm around the repair up to 4.4 cm.  No evidence of endoleak.  He was also noted to have a pancreatic pseudocyst denoting resolution of recent pancreatitis.  He is followed by GI.  PMHx:  Past Medical History:  Diagnosis Date   Coronary artery disease    Diabetes mellitus without complication ([place])     Heart attack ([place])    High cholesterol    Hypertension     Past Surgical History:  Procedure Laterality Date   ABDOMINAL AORTIC ANEURYSM REPAIR     BACK SURGERY     x2   BIOPSY  06/03/2021   Procedure: BIOPSY;  Surgeon: Eloise Harman, DO;  [facility]: [facility];  Service: Endoscopy;;   CARDIAC CATHETERIZATION     stent placed on right side ofo heart   COLONOSCOPY WITH PROPOFOL N/A 06/03/2021   Surgeon: Eloise Harman, DO; Nonbleeding internal hemorrhoids, 6 mm polyp in sigmoid colon, 2 mm polyp in the ascending colon, congested and erythematous mucosa in the cecum biopsied.  Pathology revealed 2 tubular adenomas, cecal biopsy with polypoid colonic mucosa with ectatic vascular channels with small vascular malformation not ruled out. Repeat in 5 years.   ESOPHAGOGASTRODUODENOSCOPY (EGD) WITH PROPOFOL N/A 06/03/2021   Surgeon: Eloise Harman, DO; Gastritis biopsied, nonobstructing, nonbleeding duodenal ulcer with no stigmata of bleeding, NSAID induced etiology.  Pathology with chronic gastritis, negative for H. pylori.   KNEE SURGERY Left    PARTIAL COLECTOMY     POLYPECTOMY  06/03/2021   Procedure: POLYPECTOMY INTESTINAL;  Surgeon: Eloise Harman, DO;  Location: [facility];  Service: Endoscopy;;    FAMHx:  Family History  Problem Relation Age of Onset   Pancreatitis Neg Hx    Pancreatic cancer Neg Hx    Colon cancer Neg Hx  SOCHx:   reports that he has been smoking cigarettes. He has been smoking an average of 1 pack per day. He has never been exposed to tobacco smoke. He has never used smokeless tobacco. He reports current alcohol use. He reports that he does not use drugs.  ALLERGIES:  Allergies  Allergen Reactions   Bee Venom Anaphylaxis   Coconut (Cocos Nucifera) Anaphylaxis    ROS: Pertinent items noted in HPI and remainder of comprehensive ROS otherwise negative.  HOME MEDS: Current Outpatient Medications on File Prior to Visit  Medication  Sig Dispense Refill   acetaminophen (TYLENOL) 325 MG tablet Take 325 mg by mouth every 4 (four) hours as needed for pain.     aspirin 81 MG EC tablet Take 81 mg by mouth daily.     Cholecalciferol (VITAMIN D3) 125 MCG (5000 UT) TABS Take 1 tablet by mouth daily.     cyclobenzaprine (FLEXERIL) 5 MG tablet Take 5 mg by mouth every 8 (eight) hours as needed.     fenofibrate 160 MG tablet Take 1 tablet (160 mg total) by mouth daily. 30 tablet 3   gabapentin (NEURONTIN) 100 MG capsule Take 100 mg by mouth daily.     glimepiride (AMARYL) 4 MG tablet Take 4 mg by mouth 2 (two) times daily.     insulin glargine-yfgn (SEMGLEE) 100 UNIT/ML Pen Inject 15 Units into the skin daily.     lisinopril (ZESTRIL) 20 MG tablet Take 20 mg by mouth daily.     metFORMIN (GLUCOPHAGE) 1000 MG tablet Take 1,000 mg by mouth 2 (two) times daily.     metoprolol tartrate (LOPRESSOR) 50 MG tablet Take 50 mg by mouth 2 (two) times daily.     nitroGLYCERIN (NITROSTAT) 0.4 MG SL tablet Place 0.4 mg under the tongue every 5 (five) minutes as needed for chest pain.     omega-3 acid ethyl esters (LOVAZA) 1 g capsule Take 2 capsules (2 g total) by mouth 2 (two) times daily. 120 capsule 3   pantoprazole (PROTONIX) 40 MG tablet Take 1 tablet (40 mg total) by mouth daily. 90 tablet 3   rosuvastatin (CRESTOR) 40 MG tablet Take 20 mg by mouth daily.     No current facility-administered medications on file prior to visit.    LABS/IMAGING: No results found for this or any previous visit (from the past 48 hour(s)). No results found.  LIPID PANEL:    Component Value Date/Time   CHOL 102 02/15/2022 0406   TRIG 420 (H) 02/15/2022 0406   HDL 18 (L) 02/15/2022 0406   CHOLHDL 5.7 02/15/2022 0406   VLDL UNABLE TO CALCULATE IF TRIGLYCERIDE OVER 400 mg/dL 02/15/2022 0406   LDLCALC UNABLE TO CALCULATE IF TRIGLYCERIDE OVER 400 mg/dL 02/15/2022 0406   LDLDIRECT 64 05/25/2022 0844   LDLDIRECT 26.0 02/15/2022 0406    WEIGHTS: Wt Readings  from Last 3 Encounters:  05/29/22 211 lb 9.6 oz (96 kg)  05/03/22 210 lb 12.8 oz (95.6 kg)  03/09/22 210 lb (95.3 kg)    VITALS: BP 108/70   Pulse 60   Ht '5\' 9"'$  (1.753 m)   Wt 211 lb 9.6 oz (96 kg)   SpO2 97%   BMI 31.25 kg/m   EXAM: General appearance: alert and no distress Neck: no carotid bruit, no JVD, and thyroid not enlarged, symmetric, no tenderness/mass/nodules Lungs: clear to auscultation bilaterally Heart: regular rate and rhythm, S1, S2 normal, no murmur, click, rub or gallop Abdomen: soft, non-tender; bowel sounds normal; no masses,  no organomegaly and midline abdominal scar Extremities: extremities normal, atraumatic, no cyanosis or edema Pulses: 2+ and symmetric Skin: Skin color, texture, turgor normal. No rashes or lesions Neurologic: Grossly normal Psych: Pleasant  EKG: N/A  ASSESSMENT: Coronary artery disease with prior MI, status post PCI to the mid RCA with a 3.0 x 28 mm vision MultiLink stent (2012) History of ruptured AAA in 2009 status post open emergent repair Hypertension Dyslipidemia with high triglycerides and recent pancreatitis Type 2 diabetes Pancreatic pseudocyst  PLAN: 1.   Clayton Peterson has had improvement in his lipids, specifically improvement in his triglycerides.  I would recommend we continue his current regimen as his levels would put him at a lower risk of recurrent triglyceride related pancreatitis.  He does have a pseudocyst and is followed by GI.  In addition his CT angiogram showed aneurysm around the previously repaired abdominal aorta.  I will refer him to vascular surgery for follow-up.  Plan follow-up with me annually or sooner as necessary.  Pixie Casino, MD, [facility], [place] Director of the Advanced Lipid Disorders &   Cardiovascular Risk Reduction Clinic Diplomate of the American Board of Clinical Lipidology Attending Cardiologist  Direct Dial: [phone]  Fax: [phone]   Website:  [email or web address]  Nadean Corwin Rheagan Nayak 05/29/2022, 1:33 PM

## 2022-05-29 NOTE — Patient Instructions (Signed)
Medication Instructions:  Your physician recommends that you continue on your current medications as directed. Please refer to the Current Medication list given to you today.   Labwork: None  Testing/Procedures: None  Follow-Up: Follow up with Dr. Hilty in 1 year.   Any Other Special Instructions Will Be Listed Below (If Applicable).     If you need a refill on your cardiac medications before your next appointment, please call your pharmacy.  

## 2022-06-22 IMAGING — CT CT ANGIO CHEST-ABD-PELV FOR DISSECTION W/ AND WO/W CM
2 of 7 series · 11 of 46 positions shown, 12 images · IV contrast (Omnipaque or Isovue)
Comparison: Chest radiograph earlier today.

CLINICAL DATA: Thoracic aortic aneurysm (HSV) suspected Abdominal
pain, aortic dissection suspected

Chest and epigastric pain.
Electronic medical records states history of aortic aneurysm repair.
EXAM:
CT ANGIOGRAPHY CHEST, ABDOMEN AND PELVIS
TECHNIQUE: Non-contrast CT of the chest was initially obtained. Multidetector
CT imaging through the chest, abdomen and pelvis was performed using
the standard protocol during bolus administration of intravenous
contrast. Multiplanar reconstructed images and MIPs were obtained
and reviewed to evaluate the vascular anatomy.
CONTRAST:  75mL OMNIPAQUE IOHEXOL 350 MG/ML SOLN

[Series 4: axial arterial · axial · arterial · 0.93mm/px · z∈[+733,+1387]mm · 8 of 252 slices shown, 9 images]
[im 17/252  soft-tissue]
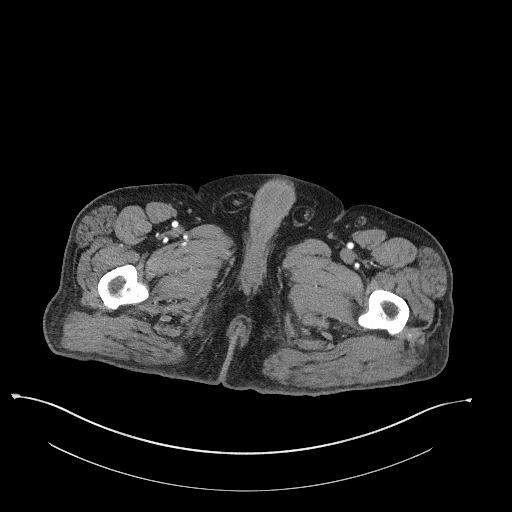
[im 17/252  bone]
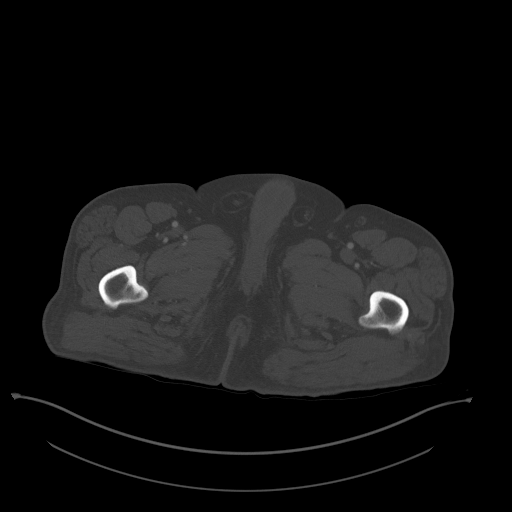
[im 51/252  soft-tissue]
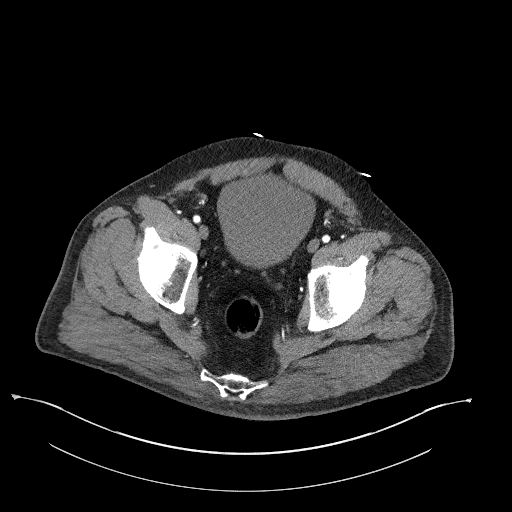
[im 84/252  soft-tissue]
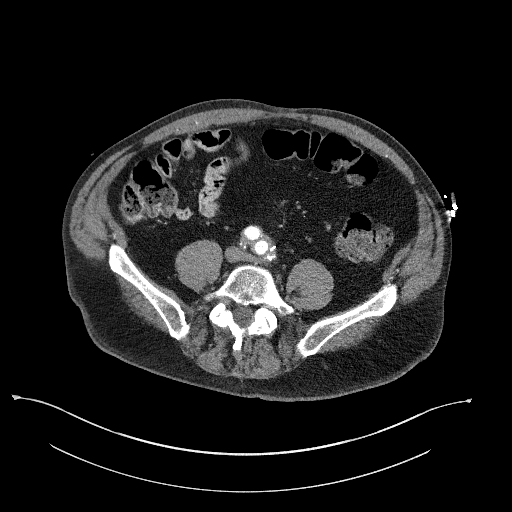
[im 118/252  soft-tissue]
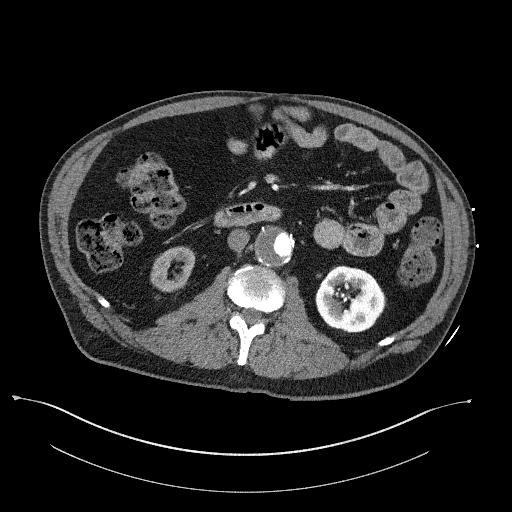
[im 134/252  soft-tissue]
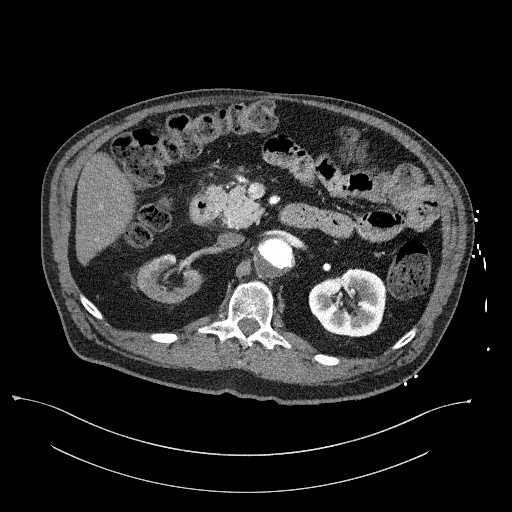
[im 168/252  soft-tissue]
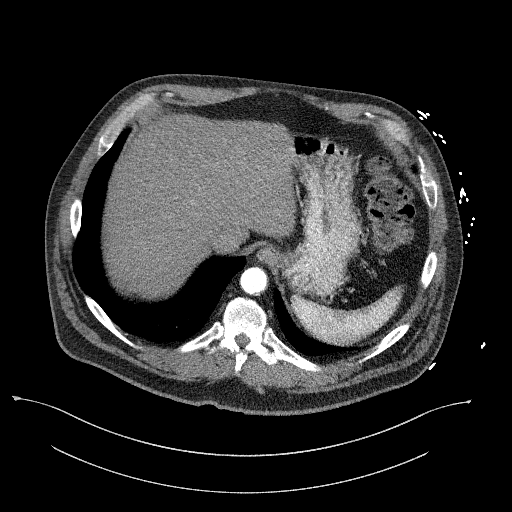
[im 201/252  soft-tissue]
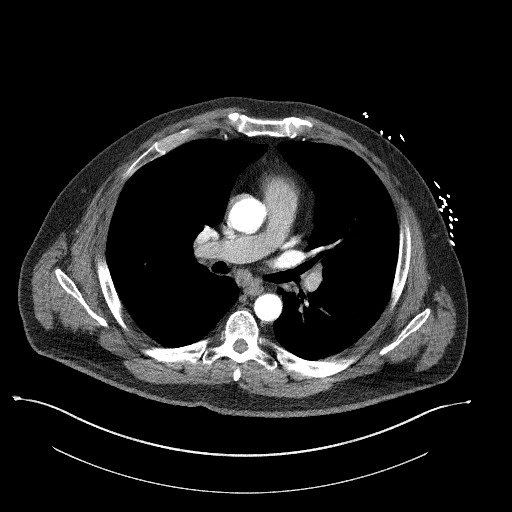
[im 235/252  soft-tissue]
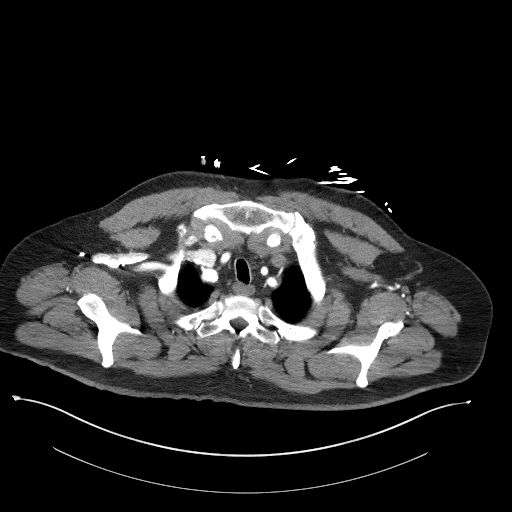

[Series 8: cor soft · coronal · 0.94mm/px · 3 of 177 slices shown]
[im 45/177  soft-tissue]
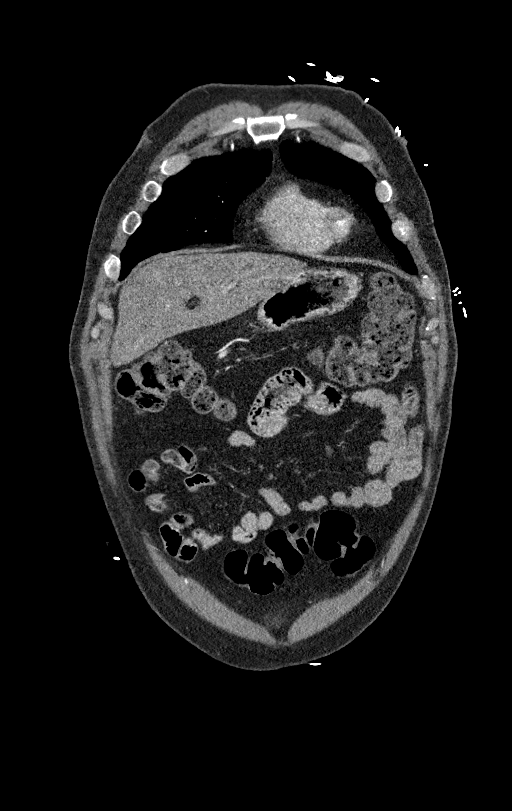
[im 89/177  soft-tissue]
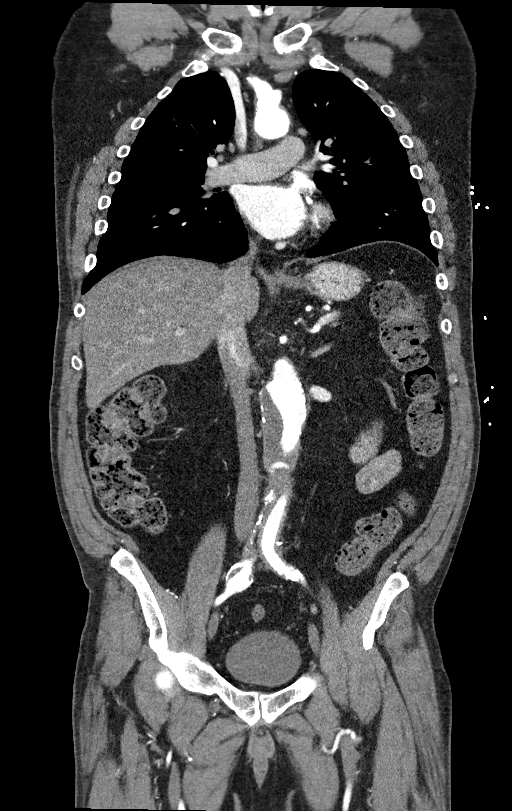
[im 133/177  soft-tissue]
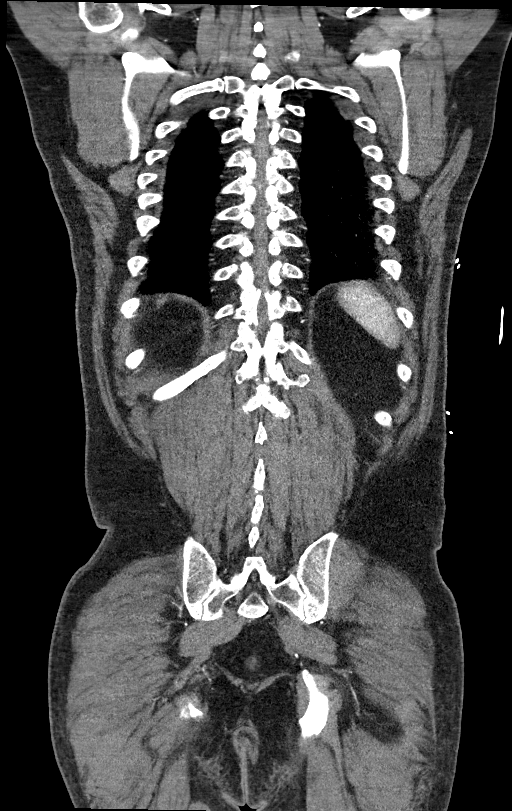

[11 of 46 positions shown; findings below may reference images not displayed]

FINDINGS: CTA CHEST FINDINGS

Cardiovascular: The thoracic aorta is normal in caliber. No
aneurysm. No aortic hematoma noncontrast exam. There is moderate
atherosclerosis with both calcified and irregular noncalcified
plaque but no dissection, evidence of acute aortic syndrome, or
evidence of vasculitis. Common origin of the brachiocephalic and
left common carotid artery. Aortic branch vessels are patent. The
heart is normal in size. There are coronary artery calcifications.
No pericardial effusion. Cannot assess for pulmonary embolus given
phase of contrast tailored for aortic evaluation.

Mediastinum/Nodes: Shotty mediastinal lymph nodes, largest
subcarinal measuring 10 mm short axis. No enlarged mediastinal or
hilar lymph nodes by size criteria. No axillary adenopathy. No
thyroid nodule. Decompressed esophagus.

Lungs/Pleura: Mild heterogeneous pulmonary parenchyma. Punctate
calcified granuloma in the left upper lobe. No pneumonia or
confluent airspace disease. There is moderate central bronchial
thickening. No pleural fluid. No findings of pulmonary edema. No
pulmonary mass. The trachea and central bronchi are patent.

Musculoskeletal: There are no acute or suspicious osseous
abnormalities.

Review of the MIP images confirms the above findings.

CTA ABDOMEN AND PELVIS FINDINGS

VASCULAR

Aorta: Abdominal aortic aneurysm. The aorta at the level of the left
renal artery measures 3.2 cm. Aorta at the level of the left renal
vein measures 3.9 cm. The infrarenal aorta measures 4.5 cm maximal
dimension. Aorto bi-iliac stent graft in place, origin ating at the
level of the left renal artery. There is eccentric mural thrombus in
the abdominal aorta above the level of the graft, without
hemodynamically significant stenosis of the aortic lumen. Mural
thrombus extends to the proximal aspect of the graft causing loss
and 50% luminal stenosis of the abdominal aorta. The graft is
patent. No obvious endoleak on this arterial phase exam. No evidence
of vasculitis or periaortic inflammation.

Celiac: Patent without evidence of aneurysm, dissection, vasculitis
or significant stenosis. Splenic artery is tortuous.

SMA: Patent without evidence of aneurysm, dissection, vasculitis or
significant stenosis.

Renals: The left renal artery is patent. There are 2 right renal
arteries. Accessory renal artery supplies the right upper pole which
is patent. The main renal artery appears occluded at the origin,
series 8, image 86. there is minimal distal reconstitution with
thready flow distally.

IMA: Occluded at the origin with reconstitution.

Inflow: 2.3 cm right common iliac artery aneurysm. Slight aneurysmal
dilatation of the proximal left internal iliac artery at 11 mm.
Right external iliac arteries patent with moderate atherosclerosis.
There is irregular mixed plaque in the right common femoral artery,
with distal branches patent. The left external iliac artery is
patent. There is short segment occlusion of the left internal iliac
artery spanning approximately 1 cm, series 4, image 180. Distal
vessel is diminutive.

Veins: The portal and splenic veins are patent. Suprarenal IVC is
patent. No portal venous or mesenteric gas.

Review of the MIP images confirms the above findings.

NON-VASCULAR

Hepatobiliary: Suspected hepatic steatosis, not well assessed on
this arterial phase exam. No visualized focal lesion. Gallbladder
physiologically distended, no calcified stone. No biliary
dilatation.

Pancreas: No ductal dilatation or inflammation.

Spleen: Normal in size and arterial enhancement.

Adrenals/Urinary Tract: No adrenal nodule. There is diminished
perfusion to the majority of the right kidney with normal perfusion
only seen to the upper pole. Right renal atrophy and cortical
thinning. No hydronephrosis. There is cortical scarring in the upper
left kidney with at least 2 nonobstructing intrarenal calculi. No
left hydronephrosis. Unremarkable urinary bladder.

Stomach/Bowel: Decompressed stomach. There is an air-filled
outpouching with adjacent fat stranding arising from the first
portion of the duodenum, series 4, image 113. It is unclear if this
represents a duodenal ulcer or duodenal diverticulum. Small bowel is
otherwise unremarkable. No small bowel obstruction. The appendix is
not seen. Moderate colonic stool burden without colonic wall
thickening. No colonic inflammation.

Lymphatic: No abdominopelvic adenopathy.

Reproductive: Prostatic calcifications.

Other: No ascites or focal fluid collection.

Musculoskeletal: There are no acute or suspicious osseous
abnormalities.

Review of the MIP images confirms the above findings.
IMPRESSION: Vascular:

1. Abdominal aortic aneurysm repair with residual aneurysm sac
cm. There is mural thrombus in the aorta and both above and within
the upper aspect of the graft, causing less than 50% luminal
stenosis.
2. Right common iliac artery aneurysm at 3.2 cm. Short segment
occlusion of the left internal iliac artery with distal
reconstitution
3. The main right renal artery is occluded at the origin with
decreased enhancement to the majority of the right kidney, of
unknown acuity. There is diffuse right renal parenchymal thinning
suggesting this may be chronic. Small accessory upper pole artery
perfuses the far upper pole.
4. Normal caliber thoracic aorta with diffuse atherosclerosis.

Nonvascular:

1. Air-filled outpouching with adjacent fat stranding arising from
the first portion of the duodenum. It is unclear if this represents
a duodenal ulcer or duodenal diverticulum. There is adjacent fat
stranding suggesting inflammation. Endoscopy could be considered for
further evaluation.
2. Nonobstructing left renal calculi.
3. Suspected hepatic steatosis.
4. Moderate central bronchial thickening. No acute intrathoracic
abnormality.

Aortic Atherosclerosis ([id number]).

These results were called by telephone at the time of interpretation
on 05/22/2021 at [DATE] to provider KHAYROU BOYKA , who verbally
acknowledged these results.

## 2022-07-12 ENCOUNTER — Encounter: Payer: Self-pay | Admitting: Vascular Surgery

## 2022-07-12 ENCOUNTER — Ambulatory Visit (INDEPENDENT_AMBULATORY_CARE_PROVIDER_SITE_OTHER): Payer: Medicare Other | Admitting: Vascular Surgery

## 2022-07-12 VITALS — BP 125/79 | HR 85 | Temp 98.1°F | Resp 18 | Ht 69.0 in | Wt 216.0 lb

## 2022-07-12 DIAGNOSIS — I2510 Atherosclerotic heart disease of native coronary artery without angina pectoris: Secondary | ICD-10-CM | POA: Diagnosis not present

## 2022-07-12 DIAGNOSIS — Z8679 Personal history of other diseases of the circulatory system: Secondary | ICD-10-CM

## 2022-07-12 DIAGNOSIS — Z95828 Presence of other vascular implants and grafts: Secondary | ICD-10-CM | POA: Diagnosis not present

## 2022-07-12 NOTE — Progress Notes (Signed)
[facility] [place]  Patient name: Clayton Peterson MRN: [id number] DOB: May 20, 1961 Sex: male  REASON FOR CONSULT: Evaluation of recent CT scan and postop abdominal aortic aneurysm repair  HPI: Clayton Peterson is a 61 y.o. male, who is here today for evaluation.  He is here with his wife.  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  This was in [place].  His surgeon was Dr. Konrad Felix to I know well.  He has had episode of pancreatitis proximally 1 year ago and had a CT scan and recently had follow-up within the last month and he is seen today for discussion of these results.  Past Medical History:  Diagnosis Date   AAA (abdominal aortic aneurysm) ([place])    Coronary artery disease    Diabetes mellitus without complication ([place])    Heart attack ([place])    High cholesterol    Hypertension     Family History  Problem Relation Age of Onset   Pancreatitis Neg Hx    Pancreatic cancer Neg Hx    Colon cancer Neg Hx     SOCIAL HISTORY: Social History   Socioeconomic History   Marital status: Married    Spouse name: Not on file   Number of children: Not on file   Years of education: Not on file   Highest education level: Not on file  Occupational History   Not on file  Tobacco Use   Smoking status: Every Day    Packs/day: 1.00    Types: Cigarettes    Passive exposure: Never   Smokeless tobacco: Never  Vaping Use   Vaping Use: Never used  Substance and Sexual Activity   Alcohol use: Yes    Comment: very seldom   Drug use: Never   Sexual activity: Yes  Other Topics Concern   Not on file  Social History Narrative   Not on file   Social Determinants of Health   Financial Resource Strain: Not on file  Food Insecurity: Not on file  Transportation Needs: Not on file  Physical Activity: Not on file  Stress: Not on file  Social Connections: Not on file  Intimate Partner  Violence: Not on file    Allergies  Allergen Reactions   Bee Venom Anaphylaxis   Coconut (Cocos Nucifera) Anaphylaxis    Current Outpatient Medications  Medication Sig Dispense Refill   acetaminophen (TYLENOL) 325 MG tablet Take 325 mg by mouth every 4 (four) hours as needed for pain.     aspirin 81 MG EC tablet Take 81 mg by mouth daily.     Cholecalciferol (VITAMIN D3) 125 MCG (5000 UT) TABS Take 1 tablet by mouth daily.     cyclobenzaprine (FLEXERIL) 5 MG tablet Take 5 mg by mouth every 8 (eight) hours as needed.     fenofibrate 160 MG tablet Take 1 tablet (160 mg total) by mouth daily. 30 tablet 3   gabapentin (NEURONTIN) 100 MG capsule Take 100 mg by mouth daily.     glimepiride (AMARYL) 4 MG tablet Take 4 mg by mouth 2 (two) times daily.     insulin glargine-yfgn (SEMGLEE) 100 UNIT/ML Pen Inject 15 Units into the skin daily.     lisinopril (ZESTRIL) 20 MG tablet Take 20 mg by mouth daily.     metFORMIN (GLUCOPHAGE) 1000 MG tablet Take 1,000 mg by mouth 2 (two) times daily.     metoprolol tartrate (LOPRESSOR) 50 MG tablet Take  50 mg by mouth 2 (two) times daily.     nitroGLYCERIN (NITROSTAT) 0.4 MG SL tablet Place 0.4 mg under the tongue every 5 (five) minutes as needed for chest pain.     pantoprazole (PROTONIX) 40 MG tablet Take 1 tablet (40 mg total) by mouth daily. 90 tablet 3   RELION PEN NEEDLE 31G/8MM 31G X 8 MM MISC USE 1 PEN NEEDLE TWICE DAILY FOR DIABETES     rosuvastatin (CRESTOR) 40 MG tablet Take 20 mg by mouth daily.     omega-3 acid ethyl esters (LOVAZA) 1 g capsule Take 2 capsules (2 g total) by mouth 2 (two) times daily. 120 capsule 3   No current facility-administered medications for this visit.    REVIEW OF SYSTEMS:  '[X]'$  denotes positive finding, '[ ]'$  denotes negative finding Cardiac  Comments:  Chest pain or chest pressure:    Shortness of breath upon exertion:    Short of breath when lying flat:    Irregular heart rhythm:        Vascular    Pain in  calf, thigh, or hip brought on by ambulation:    Pain in feet at night that wakes you up from your sleep:     Blood clot in your veins:    Leg swelling:         Pulmonary    Oxygen at home:    Productive cough:     Wheezing:         Neurologic    Sudden weakness in arms or legs:     Sudden numbness in arms or legs:     Sudden onset of difficulty speaking or slurred speech:    Temporary loss of vision in one eye:     Problems with dizziness:         Gastrointestinal    Blood in stool:     Vomited blood:         Genitourinary    Burning when urinating:     Blood in urine:        Psychiatric    Major depression:         Hematologic    Bleeding problems:    Problems with blood clotting too easily:        Skin    Rashes or ulcers:        Constitutional    Fever or chills:      PHYSICAL EXAM: Vitals:   07/12/22 1047  BP: 125/79  Pulse: 85  Resp: 18  Temp: 98.1 F (36.7 C)  TempSrc: Temporal  SpO2: 96%  Weight: 216 lb (98 kg)  Height: '5\' 9"'$  (1.753 m)    GENERAL: The patient is a well-nourished male, in no acute distress. The vital signs are documented above. CARDIOVASCULAR: 2+ radial pulse.  2+ femoral pulses.  2+ popliteal pulses without evidence of aneurysm and 2+ dorsalis pedis pulses bilaterally Midline abdominal incision from xiphoid to pubis well-healed. PULMONARY: There is good air exchange  MUSCULOSKELETAL: There are no major deformities or cyanosis. NEUROLOGIC: No focal weakness or paresthesias are detected. SKIN: There are no ulcers or rashes noted. PSYCHIATRIC: The patient has a normal affect.  DATA:  CT scan was reviewed with the patient and his wife.  He has a dilatation of his supra juxtarenal aorta above the level of aneurysm repair with a maximal diameter of 3.2 cm.  Radiologist interpreted his aneurysm sac of being 4.5 cm.  An actuality this is the Dacron graft.  We will coordinate this true aneurysm.  He does have dilatation of his right  common iliac arteries of 2 and half centimeters.  MEDICAL ISSUES: Stable status postrepair of ruptured abdominal aortic aneurysm 2009.  He does have some dilatation of his native aorta above the repair in his right common iliac artery below this.  I would recommend repeat CT scan in 1 year for continued follow-up.   Rosetta Posner, MD FACS [facility] [phone] Pager [phone]  Note: Portions of this report may have been transcribed using voice recognition software.  Every effort has been made to ensure accuracy; however, inadvertent computerized transcription errors may still be present.

## 2022-11-03 ENCOUNTER — Ambulatory Visit (INDEPENDENT_AMBULATORY_CARE_PROVIDER_SITE_OTHER): Payer: PPO | Admitting: Gastroenterology

## 2022-11-03 ENCOUNTER — Encounter: Payer: Self-pay | Admitting: Gastroenterology

## 2022-11-03 VITALS — BP 113/70 | HR 62 | Temp 98.1°F | Ht 69.0 in | Wt 218.2 lb

## 2022-11-03 DIAGNOSIS — Z8719 Personal history of other diseases of the digestive system: Secondary | ICD-10-CM | POA: Diagnosis not present

## 2022-11-03 DIAGNOSIS — K863 Pseudocyst of pancreas: Secondary | ICD-10-CM | POA: Diagnosis not present

## 2022-11-03 DIAGNOSIS — R1033 Periumbilical pain: Secondary | ICD-10-CM | POA: Diagnosis not present

## 2022-11-03 DIAGNOSIS — R109 Unspecified abdominal pain: Secondary | ICD-10-CM | POA: Insufficient documentation

## 2022-11-03 NOTE — Patient Instructions (Signed)
Plan for CT scan of your pancreas to rule out pancreatic mass/stricture and to evaluate ongoing abdominal pain.

## 2022-11-03 NOTE — Progress Notes (Unsigned)
GI Office Note    Referring Provider: Alanson Puls, [facility] Primary Care Physician:  [facility]  Primary Gastroenterologist: Elon Alas. Abbey Chatters, DO   Chief Complaint   Chief Complaint  Patient presents with   Abdominal Pain    Lower abdominal pain after eating    History of Present Illness   Clayton Peterson is a 62 y.o. male presenting today for follow-up.  Last seen in July.  He has a history of adenomatous colon polyps, GERD, gastritis, duodenal ulcer in the setting of NSAIDs in August 2022, pancreatitis in April 2023.  Etiology of pancreatitis unclear, cannot rule out secondary to hypertriglyceridemia with levels at 709.  He was on omeprazole chronically at that time which is a class II medication with potential to cause pancreatitis, was switched to pantoprazole given this possibility.  He was supposed to have pancreatic protocol CT in August but this was not done.  Today: Postprandial mid abd pain since pancreatitis. Does not seem to matter what he eats or how much. No improvement with BM. No n/v. No weight loss. BM regular. No melena, brbpr. Some urgency. No heartburn.   Medications   Current Outpatient Medications  Medication Sig Dispense Refill   ACCU-CHEK GUIDE test strip USE 1 STRIP TO CHECK GLUCOSE THREE TIMES DAILY     acetaminophen (TYLENOL) 325 MG tablet Take 325 mg by mouth every 4 (four) hours as needed for pain.     aspirin 81 MG EC tablet Take 81 mg by mouth daily.     Cholecalciferol (VITAMIN D3) 125 MCG (5000 UT) TABS Take 1 tablet by mouth daily.     cyclobenzaprine (FLEXERIL) 5 MG tablet Take 5 mg by mouth every 8 (eight) hours as needed.     fenofibrate 160 MG tablet Take 1 tablet (160 mg total) by mouth daily. 30 tablet 3   gabapentin (NEURONTIN) 100 MG capsule Take 100 mg by mouth daily.     glimepiride (AMARYL) 4 MG tablet Take 4 mg by mouth 2 (two) times daily.     insulin glargine-yfgn (SEMGLEE) 100 UNIT/ML Pen Inject 15 Units  into the skin daily.     lisinopril (ZESTRIL) 20 MG tablet Take 20 mg by mouth daily.     metFORMIN (GLUCOPHAGE) 1000 MG tablet Take 1,000 mg by mouth 2 (two) times daily.     metoprolol tartrate (LOPRESSOR) 50 MG tablet Take 50 mg by mouth 2 (two) times daily.     nitroGLYCERIN (NITROSTAT) 0.4 MG SL tablet Place 0.4 mg under the tongue every 5 (five) minutes as needed for chest pain.     omega-3 acid ethyl esters (LOVAZA) 1 g capsule Take 2 capsules (2 g total) by mouth 2 (two) times daily. 120 capsule 3   pantoprazole (PROTONIX) 40 MG tablet Take 1 tablet (40 mg total) by mouth daily. 90 tablet 3   RELION PEN NEEDLE 31G/8MM 31G X 8 MM MISC USE 1 PEN NEEDLE TWICE DAILY FOR DIABETES     rosuvastatin (CRESTOR) 40 MG tablet Take 40 mg by mouth daily.     No current facility-administered medications for this visit.    Allergies   Allergies as of 11/03/2022 - Review Complete 11/03/2022  Allergen Reaction Noted   Bee venom Anaphylaxis 05/22/2021   Coconut (cocos nucifera) Anaphylaxis 05/22/2021        Review of Systems   General: Negative for anorexia, weight loss, fever, chills, fatigue, weakness. ENT: Negative for hoarseness, difficulty swallowing , nasal  congestion. CV: Negative for chest pain, angina, palpitations, dyspnea on exertion, peripheral edema.  Respiratory: Negative for dyspnea at rest, dyspnea on exertion, cough, sputum, wheezing.  GI: See history of present illness. GU:  Negative for dysuria, hematuria, urinary incontinence, urinary frequency, nocturnal urination.  Endo: Negative for unusual weight change.     Physical Exam   BP 113/70 (BP Location: Right Arm, Patient Position: Sitting, Cuff Size: Large)   Pulse 62   Temp 98.1 F (36.7 C) (Oral)   Ht '5\' 9"'$  (1.753 m)   Wt 218 lb 3.2 oz (99 kg)   SpO2 95%   BMI 32.22 kg/m    General: Well-nourished, well-developed in no acute distress.  Eyes: No icterus. Mouth: Oropharyngeal mucosa moist and pink  Abdomen:  Bowel sounds are normal, nondistended, no hepatosplenomegaly or masses,  no abdominal bruits, no rebound or guarding. Rectus diastasis. Tender right of umbilical with palpation Rectal: not performed  Extremities: No lower extremity edema. No clubbing or deformities. Neuro: Alert and oriented x 4   Skin: Warm and dry, no jaundice.   Psych: Alert and cooperative, normal mood and affect.  Labs   Labs from November 2023: White blood cell count 9800, hemoglobin 16.2, platelets 136,000, glucose 206, creatinine 1.68, BUN 25, albumin 4.3, total bilirubin 0.2, alk phos 79, AST 10, ALT 14, triglycerides 808, A1c 8.7 down from 9.1, TSH 1.890, PSA 0.2   Imaging Studies   No results found.  Assessment   H/O pancreatitis with pseudocyst formation: potentially due to hypertriglyceridemia. Most recent triglycerides 808. Complains of persistent periumbilical abdominal pain since 01/2022 when he was hospitalized with pancreatitis. 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    PLAN   CT pancreatic protocol. He should follow up with cardiology for management of triglycerides.    Laureen Ochs. Bobby Rumpf, [place], [facility]

## 2022-11-08 ENCOUNTER — Encounter: Payer: Self-pay | Admitting: *Deleted

## 2022-11-08 ENCOUNTER — Telehealth: Payer: Self-pay | Admitting: *Deleted

## 2022-11-08 NOTE — Telephone Encounter (Signed)
Mailed letter with appt date, time and instructions to pt for the CT.

## 2022-11-13 NOTE — Telephone Encounter (Signed)
Mahala Menghini, PA-C  Clayton Jakubiak L, LPN It would probably be ok if patient is opposed to going to [place]. You could offer him to go to [facility] and see what he says       Previous Messages    ----- Message ----- From: Madelin Rear, LPN Sent: 2/51/8984   8:50 AM EST To: Mahala Menghini, PA-C  [name] is booking in February for CT. This pt is has an appointment on 12/12/22. Is this appointment ok or do you want me to find another place sooner? Please advise. Thank you

## 2022-11-13 NOTE — Telephone Encounter (Signed)
LMTRC

## 2022-11-15 NOTE — Telephone Encounter (Signed)
LMTRC

## 2022-12-12 ENCOUNTER — Ambulatory Visit (HOSPITAL_COMMUNITY)
Admission: RE | Admit: 2022-12-12 | Discharge: 2022-12-12 | Disposition: A | Discharge status: Home / Self Care | Payer: PPO | Source: Ambulatory Visit | Attending: Gastroenterology | Admitting: Gastroenterology

## 2022-12-12 ENCOUNTER — Encounter (HOSPITAL_COMMUNITY): Payer: Self-pay | Admitting: Radiology

## 2022-12-12 DIAGNOSIS — K863 Pseudocyst of pancreas: Secondary | ICD-10-CM | POA: Diagnosis present

## 2022-12-12 DIAGNOSIS — Z8719 Personal history of other diseases of the digestive system: Secondary | ICD-10-CM | POA: Insufficient documentation

## 2022-12-12 DIAGNOSIS — R1033 Periumbilical pain: Secondary | ICD-10-CM | POA: Insufficient documentation

## 2022-12-12 LAB — POCT I-STAT CREATININE: Creatinine, Ser: 1.9 mg/dL — ABNORMAL HIGH (ref 0.61–1.24)

## 2022-12-12 MED ORDER — IOHEXOL 300 MG/ML  SOLN
100.0000 mL | Freq: Once | INTRAMUSCULAR | Status: AC | PRN
  Administered 2022-12-12: 75 mL via INTRAVENOUS

## 2022-12-13 ENCOUNTER — Other Ambulatory Visit: Payer: Self-pay | Admitting: Gastroenterology

## 2022-12-13 MED ORDER — PANTOPRAZOLE SODIUM 40 MG PO TBEC: 40.0000 mg | DELAYED_RELEASE_TABLET | Freq: Two times a day (BID) | ORAL | 1 refills | Status: DC

## 2022-12-13 MED ORDER — PANCRELIPASE (LIP-PROT-AMYL) 36000-114000 UNITS PO CPEP: ORAL_CAPSULE | ORAL | 5 refills | Status: DC

## 2022-12-28 ENCOUNTER — Ambulatory Visit (HOSPITAL_COMMUNITY)
Admission: RE | Admit: 2022-12-28 | Discharge: 2022-12-28 | Disposition: A | Discharge status: Home / Self Care | Payer: PPO | Source: Ambulatory Visit | Attending: Family Medicine | Admitting: Family Medicine

## 2022-12-28 ENCOUNTER — Ambulatory Visit (INDEPENDENT_AMBULATORY_CARE_PROVIDER_SITE_OTHER): Payer: PPO | Admitting: Family Medicine

## 2022-12-28 ENCOUNTER — Encounter: Payer: Self-pay | Admitting: Family Medicine

## 2022-12-28 VITALS — BP 117/64 | HR 70 | Ht 69.0 in | Wt 216.0 lb

## 2022-12-28 DIAGNOSIS — E785 Hyperlipidemia, unspecified: Secondary | ICD-10-CM

## 2022-12-28 DIAGNOSIS — R079 Chest pain, unspecified: Secondary | ICD-10-CM

## 2022-12-28 DIAGNOSIS — E039 Hypothyroidism, unspecified: Secondary | ICD-10-CM | POA: Diagnosis not present

## 2022-12-28 DIAGNOSIS — G8929 Other chronic pain: Secondary | ICD-10-CM | POA: Insufficient documentation

## 2022-12-28 DIAGNOSIS — Z1159 Encounter for screening for other viral diseases: Secondary | ICD-10-CM

## 2022-12-28 DIAGNOSIS — E119 Type 2 diabetes mellitus without complications: Secondary | ICD-10-CM

## 2022-12-28 DIAGNOSIS — I10 Essential (primary) hypertension: Secondary | ICD-10-CM

## 2022-12-28 DIAGNOSIS — R7301 Impaired fasting glucose: Secondary | ICD-10-CM

## 2022-12-28 MED ORDER — FENOFIBRATE 160 MG PO TABS: 160.0000 mg | ORAL_TABLET | Freq: Every day | ORAL | 3 refills | Status: DC

## 2022-12-28 MED ORDER — METOPROLOL TARTRATE 50 MG PO TABS: 50.0000 mg | ORAL_TABLET | Freq: Two times a day (BID) | ORAL | 3 refills | Status: DC

## 2022-12-28 MED ORDER — NITROGLYCERIN 0.4 MG SL SUBL: 0.4000 mg | SUBLINGUAL_TABLET | SUBLINGUAL | 3 refills | Status: AC | PRN

## 2022-12-28 NOTE — Assessment & Plan Note (Addendum)
Vitals:   12/28/22 1328  BP: 117/64    Labs,Chest xray, EKG ordered, - will follow up awaiting results. Prescribed nitroglycerin sublingual, advise take Asprin 81 mg daily Discussed signs and symptoms of major cardiovascular event and need to present to the ED if symptoms worsen Patient verbalizes understanding regarding plan of care and all questions answered.

## 2022-12-28 NOTE — Progress Notes (Signed)
New Patient Office Visit   Subjective   Patient ID: Clayton Peterson, male    DOB: Mar 14, 1961  Age: 62 y.o. MRN: [id number]  CC:  Chief Complaint  Patient presents with   Establish Care    HPI [facility] 62 year old male, presents to establish care. He  has a past medical history of AAA (abdominal aortic aneurysm) ([place]), Coronary artery disease, Diabetes mellitus without complication ([place]), Heart attack ([place]), High cholesterol, and Hypertension.  Chest Pain: Patient complains of chest pain. Onset was 2 weeks ago, with unchanged course since that time. The patient describes the pain as intermittent, sharp in nature, radiates to the upper back. Patient rates pain as a 5/10 in intensity.  Associated symptoms are chest pain and chest pressure/discomfort. Aggravating factors are walking.  Alleviating factors are: none. Patient's cardiac risk factors are advanced age  diabetes mellitus, dyslipidemia, hypertension, obesity (BMI >= 30 kg/m2), sedentary lifestyle, and smoking/ tobacco exposure.        Outpatient Encounter Medications as of 12/28/2022  Medication Sig   ACCU-CHEK GUIDE test strip USE 1 STRIP TO CHECK GLUCOSE THREE TIMES DAILY   acetaminophen (TYLENOL) 325 MG tablet Take 325 mg by mouth every 4 (four) hours as needed for pain.   aspirin 81 MG EC tablet Take 81 mg by mouth daily.   Cholecalciferol (VITAMIN D3) 125 MCG (5000 UT) TABS Take 1 tablet by mouth daily.   cyclobenzaprine (FLEXERIL) 5 MG tablet Take 5 mg by mouth every 8 (eight) hours as needed.   gabapentin (NEURONTIN) 100 MG capsule Take 100 mg by mouth daily.   glimepiride (AMARYL) 4 MG tablet Take 4 mg by mouth 2 (two) times daily.   insulin glargine (LANTUS) 100 UNIT/ML injection Inject 100 Units into the skin daily. 20 units AM, 20 units PM   lipase/protease/amylase (CREON) 36000 UNITS CPEP capsule Take two with meals and 1 with snacks. Take with first bite of food.   lisinopril (ZESTRIL) 20 MG tablet Take 20 mg by  mouth daily.   metFORMIN (GLUCOPHAGE) 1000 MG tablet Take 1,000 mg by mouth 2 (two) times daily.   nitroGLYCERIN (NITROSTAT) 0.4 MG SL tablet Place 1 tablet (0.4 mg total) under the tongue every 5 (five) minutes as needed for chest pain.   pantoprazole (PROTONIX) 40 MG tablet Take 1 tablet (40 mg total) by mouth 2 (two) times daily before a meal.   rosuvastatin (CRESTOR) 40 MG tablet Take 40 mg by mouth daily.   [DISCONTINUED] fenofibrate 160 MG tablet Take 1 tablet (160 mg total) by mouth daily.   [DISCONTINUED] metoprolol tartrate (LOPRESSOR) 50 MG tablet Take 50 mg by mouth 2 (two) times daily.   [DISCONTINUED] nitroGLYCERIN (NITROSTAT) 0.4 MG SL tablet Place 0.4 mg under the tongue every 5 (five) minutes as needed for chest pain.   fenofibrate 160 MG tablet Take 1 tablet (160 mg total) by mouth daily.   metoprolol tartrate (LOPRESSOR) 50 MG tablet Take 1 tablet (50 mg total) by mouth 2 (two) times daily.   omega-3 acid ethyl esters (LOVAZA) 1 g capsule Take 2 capsules (2 g total) by mouth 2 (two) times daily.   RELION PEN NEEDLE 31G/8MM 31G X 8 MM MISC USE 1 PEN NEEDLE TWICE DAILY FOR DIABETES (Patient not taking: Reported on 12/28/2022)   [DISCONTINUED] insulin glargine-yfgn (SEMGLEE) 100 UNIT/ML Pen Inject 15 Units into the skin daily. (Patient not taking: Reported on 12/28/2022)   No facility-administered encounter medications on file as of 12/28/2022.  Past Surgical History:  Procedure Laterality Date   ABDOMINAL AORTIC ANEURYSM REPAIR     BACK SURGERY     x2   BIOPSY  06/03/2021   Procedure: BIOPSY;  Surgeon: Eloise Harman, DO;  Location: [facility];  Service: Endoscopy;;   CARDIAC CATHETERIZATION     stent placed on right side ofo heart   COLONOSCOPY WITH PROPOFOL N/A 06/03/2021   Surgeon: Eloise Harman, DO; Nonbleeding internal hemorrhoids, 6 mm polyp in sigmoid colon, 2 mm polyp in the ascending colon, congested and erythematous mucosa in the cecum biopsied.  Pathology  revealed 2 tubular adenomas, cecal biopsy with polypoid colonic mucosa with ectatic vascular channels with small vascular malformation not ruled out. Repeat in 5 years.   ESOPHAGOGASTRODUODENOSCOPY (EGD) WITH PROPOFOL N/A 06/03/2021   Surgeon: Eloise Harman, DO; Gastritis biopsied, nonobstructing, nonbleeding duodenal ulcer with no stigmata of bleeding, NSAID induced etiology.  Pathology with chronic gastritis, negative for H. pylori.   KNEE SURGERY Left    PARTIAL COLECTOMY     POLYPECTOMY  06/03/2021   Procedure: POLYPECTOMY INTESTINAL;  Surgeon: Eloise Harman, DO;  Location: [facility];  Service: Endoscopy;;    Review of Systems  Constitutional:  Negative for chills and fever.  Respiratory:  Negative for cough, hemoptysis, sputum production, shortness of breath and wheezing.   Cardiovascular:  Positive for chest pain. Negative for palpitations, orthopnea, claudication, leg swelling and PND.      Objective    BP 117/64   Pulse 70   Ht '5\' 9"'$  (1.753 m)   Wt 216 lb (98 kg)   SpO2 91%   BMI 31.90 kg/m   Physical Exam Constitutional:      Appearance: He is obese.  Cardiovascular:     Rate and Rhythm: Normal rate.     Pulses: Normal pulses.     Heart sounds: No murmur heard. Pulmonary:     Effort: Pulmonary effort is normal.     Breath sounds: Normal breath sounds.  Musculoskeletal:        General: Normal range of motion.     Cervical back: Normal range of motion and neck supple.     Right lower leg: No edema.     Left lower leg: No edema.  Skin:    General: Skin is warm and dry.     Capillary Refill: Capillary refill takes less than 2 seconds.  Neurological:     General: No focal deficit present.     Mental Status: He is alert.     Gait: Gait normal.     Deep Tendon Reflexes: Reflexes normal.  Psychiatric:        Mood and Affect: Mood normal.       Assessment & Plan:  Chest pain at rest -     DG Chest 2 View; Future -     EKG 12-Lead -      Nitroglycerin; Place 1 tablet (0.4 mg total) under the tongue every 5 (five) minutes as needed for chest pain.  Dispense: 50 tablet; Refill: 3 -     D-dimer, quantitative -     Troponin I - -     CKMB -     Myoglobin, serum  Primary hypertension -     CBC with Differential/Platelet -     CMP14+EGFR -     Microalbumin / creatinine urine ratio  IFG (impaired fasting glucose) -     Hemoglobin A1c  Hyperlipidemia, unspecified hyperlipidemia type -  Lipid panel  Hypothyroidism, unspecified type -     TSH + free T4  Need for hepatitis C screening test -     Hepatitis C antibody  Encounter for eye exam in patient with type 2 diabetes mellitus ([place]) -     Ambulatory referral to Ophthalmology  Chest pain, unspecified type Assessment & Plan: Vitals:   12/28/22 1328  BP: 117/64    Labs,Chest xray, EKG ordered, - will follow up awaiting results. Prescribed nitroglycerin sublingual, advise take Asprin 81 mg daily Discussed signs and symptoms of major cardiovascular event and need to present to the ED if symptoms worsen Patient verbalizes understanding regarding plan of care and all questions answered.    Other orders -     Metoprolol Tartrate; Take 1 tablet (50 mg total) by mouth 2 (two) times daily.  Dispense: 60 tablet; Refill: 3 -     Fenofibrate; Take 1 tablet (160 mg total) by mouth daily.  Dispense: 30 tablet; Refill: 3    Return in about 3 months (around 03/30/2023) for chronic follow-up, hypertension, hyperlipidemia/ high cholestrol, diabetes.   Renard Hamper Ria Comment, FNP

## 2022-12-28 NOTE — Patient Instructions (Signed)
It was pleasure meeting with you today. Please take medications as prescribed. Follow up with your primary health provider if any health concerns arises. If symptoms worsen please contact your primary care provider and/or visit the emergency department.

## 2022-12-29 ENCOUNTER — Other Ambulatory Visit: Payer: Self-pay

## 2022-12-29 DIAGNOSIS — R079 Chest pain, unspecified: Secondary | ICD-10-CM

## 2022-12-31 ENCOUNTER — Other Ambulatory Visit: Payer: Self-pay | Admitting: Family Medicine

## 2022-12-31 DIAGNOSIS — R7989 Other specified abnormal findings of blood chemistry: Secondary | ICD-10-CM

## 2022-12-31 LAB — TROPONIN T: Troponin T (Highly Sensitive): 89 ng/L (ref 0–22)

## 2023-01-01 ENCOUNTER — Other Ambulatory Visit: Payer: Self-pay | Admitting: Family Medicine

## 2023-01-01 ENCOUNTER — Telehealth: Payer: Self-pay | Admitting: Family Medicine

## 2023-01-01 LAB — CBC WITH DIFFERENTIAL/PLATELET
Basophils Absolute: 0.1 10*3/uL (ref 0.0–0.2)
Basos: 0 %
EOS (ABSOLUTE): 0.1 10*3/uL (ref 0.0–0.4)
Eos: 1 %
Hematocrit: 47.2 % (ref 37.5–51.0)
Hemoglobin: 15.6 g/dL (ref 13.0–17.7)
Immature Grans (Abs): 0 10*3/uL (ref 0.0–0.1)
Immature Granulocytes: 0 %
Lymphocytes Absolute: 1.1 10*3/uL (ref 0.7–3.1)
Lymphs: 10 %
MCH: 29 pg (ref 26.6–33.0)
MCHC: 33.1 g/dL (ref 31.5–35.7)
MCV: 88 fL (ref 79–97)
Monocytes Absolute: 1.1 10*3/uL — ABNORMAL HIGH (ref 0.1–0.9)
Monocytes: 9 %
Neutrophils Absolute: 9.5 10*3/uL — ABNORMAL HIGH (ref 1.4–7.0)
Neutrophils: 80 %
Platelets: 148 10*3/uL — ABNORMAL LOW (ref 150–450)
RBC: 5.38 x10E6/uL (ref 4.14–5.80)
RDW: 14 % (ref 11.6–15.4)
WBC: 11.9 10*3/uL — ABNORMAL HIGH (ref 3.4–10.8)

## 2023-01-01 LAB — TSH+FREE T4
Free T4: 1.34 ng/dL (ref 0.82–1.77)
TSH: 1.38 u[IU]/mL (ref 0.450–4.500)

## 2023-01-01 LAB — LIPID PANEL
Chol/HDL Ratio: 4.1 ratio (ref 0.0–5.0)
Cholesterol, Total: 144 mg/dL (ref 100–199)
HDL: 35 mg/dL — ABNORMAL LOW (ref >39)
LDL Chol Calc (NIH): 73 mg/dL (ref 0–99)
Triglycerides: 218 mg/dL — ABNORMAL HIGH (ref 0–149)
VLDL Cholesterol Cal: 36 mg/dL (ref 5–40)

## 2023-01-01 LAB — CREATININE KINASE MB: CK-MB Index: 1.1 ng/mL (ref 0.0–10.4)

## 2023-01-01 LAB — CMP14+EGFR
ALT: 13 IU/L (ref 0–44)
AST: 10 IU/L (ref 0–40)
Albumin/Globulin Ratio: 1.8 (ref 1.2–2.2)
Albumin: 4.2 g/dL (ref 3.9–4.9)
Alkaline Phosphatase: 61 IU/L (ref 44–121)
BUN/Creatinine Ratio: 14 (ref 10–24)
BUN: 23 mg/dL (ref 8–27)
Bilirubin Total: 0.6 mg/dL (ref 0.0–1.2)
CO2: 22 mmol/L (ref 20–29)
Calcium: 9.2 mg/dL (ref 8.6–10.2)
Chloride: 98 mmol/L (ref 96–106)
Creatinine, Ser: 1.69 mg/dL — ABNORMAL HIGH (ref 0.76–1.27)
Globulin, Total: 2.3 g/dL (ref 1.5–4.5)
Glucose: 173 mg/dL — ABNORMAL HIGH (ref 70–99)
Potassium: 4.8 mmol/L (ref 3.5–5.2)
Sodium: 134 mmol/L (ref 134–144)
Total Protein: 6.5 g/dL (ref 6.0–8.5)
eGFR: 46 mL/min/{1.73_m2} — ABNORMAL LOW (ref >59)

## 2023-01-01 LAB — D-DIMER, QUANTITATIVE: D-DIMER: 4.01 mg/L FEU — ABNORMAL HIGH (ref 0.00–0.49)

## 2023-01-01 LAB — HEMOGLOBIN A1C
Est. average glucose Bld gHb Est-mCnc: 203 mg/dL
Hgb A1c MFr Bld: 8.7 % — ABNORMAL HIGH (ref 4.8–5.6)

## 2023-01-01 LAB — MICROALBUMIN / CREATININE URINE RATIO
Creatinine, Urine: 134.4 mg/dL
Microalb/Creat Ratio: 104 mg/g creat — ABNORMAL HIGH (ref 0–29)
Microalbumin, Urine: 139.3 ug/mL

## 2023-01-01 LAB — MYOGLOBIN, SERUM: Myoglobin: 59 ng/mL (ref 28–72)

## 2023-01-01 LAB — HEPATITIS C ANTIBODY: Hep C Virus Ab: NONREACTIVE

## 2023-01-01 MED ORDER — ACCU-CHEK GUIDE VI STRP: ORAL_STRIP | 3 refills | Status: DC

## 2023-01-01 NOTE — Telephone Encounter (Signed)
sent 

## 2023-01-01 NOTE — Telephone Encounter (Signed)
Prescription Request  01/01/2023  LOV: 12/28/2022  What is the name of the medication or equipment?   Test strips for [facility] ULTRA (uses it 3 times daily)   Have you contacted your pharmacy to request a refill? No   Which pharmacy would you like this sent to?  [place] [place] [facility] [id number] [place] [address] Phone: [phone] Fax: [phone]    Patient notified that their request is being sent to the clinical staff for review and that they should receive a response within 2 business days.   Please advise at [facility] [phone]

## 2023-01-02 ENCOUNTER — Other Ambulatory Visit: Payer: Self-pay | Admitting: Family Medicine

## 2023-01-02 DIAGNOSIS — E1169 Type 2 diabetes mellitus with other specified complication: Secondary | ICD-10-CM

## 2023-01-02 MED ORDER — FENOFIBRATE 200 MG PO CAPS: 200.0000 mg | ORAL_CAPSULE | Freq: Every day | ORAL | 11 refills | Status: DC

## 2023-01-02 MED ORDER — EMPAGLIFLOZIN 10 MG PO TABS: 10.0000 mg | ORAL_TABLET | Freq: Every day | ORAL | 2 refills | Status: DC

## 2023-01-15 ENCOUNTER — Encounter: Payer: Self-pay | Admitting: "Endocrinology

## 2023-01-15 ENCOUNTER — Ambulatory Visit (INDEPENDENT_AMBULATORY_CARE_PROVIDER_SITE_OTHER): Payer: PPO | Admitting: "Endocrinology

## 2023-01-15 VITALS — BP 104/62 | HR 64 | Ht 69.0 in | Wt 210.0 lb

## 2023-01-15 DIAGNOSIS — E1159 Type 2 diabetes mellitus with other circulatory complications: Secondary | ICD-10-CM

## 2023-01-15 DIAGNOSIS — I10 Essential (primary) hypertension: Secondary | ICD-10-CM

## 2023-01-15 DIAGNOSIS — E782 Mixed hyperlipidemia: Secondary | ICD-10-CM

## 2023-01-15 NOTE — Patient Instructions (Signed)
                                     Advice for Weight Management  -For most of us the best way to lose weight is by diet management. Generally speaking, diet management means consuming less calories intentionally which over time brings about progressive weight loss.  This can be achieved more effectively by avoiding ultra processed carbohydrates, processed meats, unhealthy fats.    It is critically important to know your numbers: how much calorie you are consuming and how much calorie you need. More importantly, our carbohydrates sources should be unprocessed naturally occurring  complex starch food items.  It is always important to balance nutrition also by  appropriate intake of proteins (mainly plant-based), healthy fats/oils, plenty of fruits and vegetables.   -The American College of Lifestyle Medicine (ACL M) recommends nutrition derived mostly from Whole Food, Plant Predominant Sources example an apple instead of applesauce or apple pie. Eat Plenty of vegetables, Mushrooms, fruits, Legumes, Whole Grains, Nuts, seeds in lieu of processed meats, processed snacks/pastries red meat, poultry, eggs.  Use only water or unsweetened tea for hydration.  The College also recommends the need to stay away from risky substances including alcohol, smoking; obtaining 7-9 hours of restorative sleep, at least 150 minutes of moderate intensity exercise weekly, importance of healthy social connections, and being mindful of stress and seek help when it is overwhelming.    -Sticking to a routine mealtime to eat 3 meals a day and avoiding unnecessary snacks is shown to have a big role in weight control. Under normal circumstances, the only time we burn stored energy is when we are hungry, so allow  some hunger to take place- hunger means no food between appropriate meal times, only water.  It is not advisable to starve.   -It is better to avoid simple carbohydrates including:  Cakes, Sweet Desserts, Ice Cream, Soda (diet and regular), Sweet Tea, Candies, Chips, Cookies, Store Bought Juices, Alcohol in Excess of  1-2 drinks a day, Lemonade,  Artificial Sweeteners, Doughnuts, Coffee Creamers, "Sugar-free" Products, etc, etc.  This is not a complete list.....    -Consulting with certified diabetes educators is proven to provide you with the most accurate and current information on diet.  Also, you may be  interested in discussing diet options/exchanges , we can schedule a visit with Clayton Peterson, RDN, CDE for individualized nutrition education.  -Exercise: If you are able: 30 -60 minutes a day ,4 days a week, or 150 minutes of moderate intensity exercise weekly.    The longer the better if tolerated.  Combine stretch, strength, and aerobic activities.  If you were told in the past that you have high risk for cardiovascular diseases, or if you are currently symptomatic, you may seek evaluation by your heart doctor prior to initiating moderate to intense exercise programs.                                  Additional Care Considerations for Diabetes/Prediabetes   -Diabetes  is a chronic disease.  The most important care consideration is regular follow-up with your diabetes care provider with the goal being avoiding or delaying its complications and to take advantage of advances in medications and technology.  If appropriate actions are taken early enough, type 2 diabetes can even be   reversed.  Seek information from the right source.  - Whole Food, Plant Predominant Nutrition is highly recommended: Eat Plenty of vegetables, Mushrooms, fruits, Legumes, Whole Grains, Nuts, seeds in lieu of processed meats, processed snacks/pastries red meat, poultry, eggs as recommended by American College of  Lifestyle Medicine (ACLM).  -Type 2 diabetes is known to coexist with other important comorbidities such as high blood pressure and high cholesterol.  It is critical to control not only the  diabetes but also the high blood pressure and high cholesterol to minimize and delay the risk of complications including coronary artery disease, stroke, amputations, blindness, etc.  The good news is that this diet recommendation for type 2 diabetes is also very helpful for managing high cholesterol and high blood blood pressure.  - Studies showed that people with diabetes will benefit from a class of medications known as ACE inhibitors and statins.  Unless there are specific reasons not to be on these medications, the standard of care is to consider getting one from these groups of medications at an optimal doses.  These medications are generally considered safe and proven to help protect the heart and the kidneys.    - People with diabetes are encouraged to initiate and maintain regular follow-up with eye doctors, foot doctors, dentists , and if necessary heart and kidney doctors.     - It is highly recommended that people with diabetes quit smoking or stay away from smoking, and get yearly  flu vaccine and pneumonia vaccine at least every 5 years.  See above for additional recommendations on exercise, sleep, stress management , and healthy social connections.      

## 2023-01-15 NOTE — Progress Notes (Signed)
Endocrinology Consult Note       01/15/2023, 1:11 PM   Subjective:    Patient ID: Clayton Peterson, male    DOB: 11-Apr-1961.  Clayton Peterson is being seen in consultation for management of currently uncontrolled symptomatic diabetes requested by  Juanda Chance, FNP.   Past Medical History:  Diagnosis Date   AAA (abdominal aortic aneurysm) ([place])    Coronary artery disease    Diabetes mellitus without complication ([place])    Heart attack ([place])    High cholesterol    Hypertension     Past Surgical History:  Procedure Laterality Date   ABDOMINAL AORTIC ANEURYSM REPAIR     BACK SURGERY     x2   BIOPSY  06/03/2021   Procedure: BIOPSY;  Surgeon: Eloise Harman, DO;  [facility]: [facility];  Service: Endoscopy;;   CARDIAC CATHETERIZATION     stent placed on right side ofo heart   COLONOSCOPY WITH PROPOFOL N/A 06/03/2021   Surgeon: Eloise Harman, DO; Nonbleeding internal hemorrhoids, 6 mm polyp in sigmoid colon, 2 mm polyp in the ascending colon, congested and erythematous mucosa in the cecum biopsied.  Pathology revealed 2 tubular adenomas, cecal biopsy with polypoid colonic mucosa with ectatic vascular channels with small vascular malformation not ruled out. Repeat in 5 years.   ESOPHAGOGASTRODUODENOSCOPY (EGD) WITH PROPOFOL N/A 06/03/2021   Surgeon: Eloise Harman, DO; Gastritis biopsied, nonobstructing, nonbleeding duodenal ulcer with no stigmata of bleeding, NSAID induced etiology.  Pathology with chronic gastritis, negative for H. pylori.   KNEE SURGERY Left    PARTIAL COLECTOMY     POLYPECTOMY  06/03/2021   Procedure: POLYPECTOMY INTESTINAL;  Surgeon: Eloise Harman, DO;  Location: [facility];  Service: Endoscopy;;    Social History   Socioeconomic History   Marital status: Married    Spouse name: Not on file   Number of children: Not on file   Years of education: Not on  file   Highest education level: Not on file  Occupational History   Not on file  Tobacco Use   Smoking status: Every Day    Packs/day: 1    Types: Cigarettes    Passive exposure: Never   Smokeless tobacco: Never  Vaping Use   Vaping Use: Never used  Substance and Sexual Activity   Alcohol use: Yes    Comment: very seldom   Drug use: Never   Sexual activity: Yes  Other Topics Concern   Not on file  Social History Narrative   Not on file   Social Determinants of Health   Financial Resource Strain: Not on file  Food Insecurity: Not on file  Transportation Needs: Not on file  Physical Activity: Not on file  Stress: Not on file  Social Connections: Not on file    Family History  Problem Relation Age of Onset   Atrial fibrillation Mother    Alzheimer's disease Mother    Diabetes Brother    Alzheimer's disease Maternal Grandmother    Aneurysm Maternal Grandfather    Pancreatitis Neg Hx    Pancreatic cancer Neg Hx    Colon  cancer Neg Hx     Outpatient Encounter Medications as of 01/15/2023  Medication Sig   ACCU-CHEK GUIDE test strip USE 1 STRIP TO CHECK GLUCOSE THREE TIMES DAILY   acetaminophen (TYLENOL) 325 MG tablet Take 325 mg by mouth every 4 (four) hours as needed for pain.   aspirin 81 MG EC tablet Take 81 mg by mouth daily.   Cholecalciferol (VITAMIN D3) 125 MCG (5000 UT) TABS Take 1 tablet by mouth daily.   empagliflozin (JARDIANCE) 10 MG TABS tablet Take 1 tablet (10 mg total) by mouth daily before breakfast.   fenofibrate micronized (LOFIBRA) 200 MG capsule Take 1 capsule (200 mg total) by mouth daily before breakfast.   gabapentin (NEURONTIN) 100 MG capsule Take 100 mg by mouth daily.   insulin glargine (LANTUS) 100 UNIT/ML injection Inject 30 Units into the skin daily. 20 units AM, 20 units PM   lipase/protease/amylase (CREON) 36000 UNITS CPEP capsule Take two with meals and 1 with snacks. Take with first bite of food.   lisinopril (ZESTRIL) 20 MG tablet  Take 20 mg by mouth daily.   metFORMIN (GLUCOPHAGE) 1000 MG tablet Take 500 mg by mouth 2 (two) times daily with a meal.   metoprolol tartrate (LOPRESSOR) 50 MG tablet Take 1 tablet (50 mg total) by mouth 2 (two) times daily.   nitroGLYCERIN (NITROSTAT) 0.4 MG SL tablet Place 1 tablet (0.4 mg total) under the tongue every 5 (five) minutes as needed for chest pain.   omega-3 acid ethyl esters (LOVAZA) 1 g capsule Take 2 capsules (2 g total) by mouth 2 (two) times daily.   pantoprazole (PROTONIX) 40 MG tablet Take 1 tablet (40 mg total) by mouth 2 (two) times daily before a meal.   RELION PEN NEEDLE 31G/8MM 31G X 8 MM MISC USE 1 PEN NEEDLE TWICE DAILY FOR DIABETES (Patient not taking: Reported on 12/28/2022)   rosuvastatin (CRESTOR) 40 MG tablet Take 40 mg by mouth daily.   [DISCONTINUED] cyclobenzaprine (FLEXERIL) 5 MG tablet Take 5 mg by mouth every 8 (eight) hours as needed.   [DISCONTINUED] glimepiride (AMARYL) 4 MG tablet Take 4 mg by mouth 2 (two) times daily.   No facility-administered encounter medications on file as of 01/15/2023.    ALLERGIES: Allergies  Allergen Reactions   Bee Venom Anaphylaxis   Coconut (Cocos Nucifera) Anaphylaxis    VACCINATION STATUS: Immunization History  Administered Date(s) Administered   Influenza Inj Mdck Quad Pf 08/31/2022   Influenza Split 01/09/2017, 09/13/2019    Diabetes He presents for his initial diabetic visit. He has type 2 diabetes mellitus. Onset time: He was diagnosed at approximate age of 28 years. There are no hypoglycemic associated symptoms. Pertinent negatives for hypoglycemia include no confusion, headaches, pallor or seizures. Associated symptoms include polydipsia and polyuria. Pertinent negatives for diabetes include no chest pain, no fatigue, no polyphagia and no weakness. There are no hypoglycemic complications. Symptoms are worsening. Diabetic complications include heart disease and nephropathy. Risk factors for coronary artery  disease include family history, dyslipidemia, diabetes mellitus, male sex, obesity, sedentary lifestyle and tobacco exposure. Current diabetic treatments: He is currently on an Lantus to 8 units twice daily Jardiance 10 mg p.o. daily, metformin 1000 p.o. twice daily and   glimepiride 4 mg p.o. twice daily. His home blood glucose trend is fluctuating minimally.  Hyperlipidemia Pertinent negatives include no chest pain or myalgias.     Review of Systems  Constitutional:  Negative for chills, fatigue, fever and unexpected weight change.  HENT:  Negative for dental problem, mouth sores and trouble swallowing.   Eyes:  Negative for visual disturbance.  Respiratory:  Negative for wheezing.   Cardiovascular:  Negative for chest pain, palpitations and leg swelling.  Gastrointestinal:  Negative for abdominal distention, abdominal pain, constipation, diarrhea, nausea and vomiting.  Endocrine: Positive for polydipsia and polyuria. Negative for polyphagia.  Genitourinary:  Negative for dysuria, flank pain, hematuria and urgency.  Musculoskeletal:  Negative for back pain, gait problem, myalgias and neck pain.  Skin:  Negative for pallor, rash and wound.  Neurological:  Negative for seizures, syncope, weakness, numbness and headaches.  Psychiatric/Behavioral:  Negative for confusion and dysphoric mood.     Objective:       01/15/2023    9:21 AM 12/28/2022    1:28 PM 11/03/2022    9:02 AM  Vitals with BMI  Height 5\' 9"  5\' 9"  5\' 9"   Weight 210 lbs 216 lbs 218 lbs 3 oz  BMI 31 [id number]  Systolic 123456 [id number]  Diastolic 62 64 70  Pulse 64 70 62    BP 104/62   Pulse 64   Ht 5\' 9"  (1.753 m)   Wt 210 lb (95.3 kg)   BMI 31.01 kg/m   Wt Readings from Last 3 Encounters:  01/15/23 210 lb (95.3 kg)  12/28/22 216 lb (98 kg)  11/03/22 218 lb 3.2 oz (99 kg)     Physical Exam Constitutional:      General: He is not in acute distress.    Appearance: He is well-developed.  HENT:     Head:  Normocephalic and atraumatic.  Neck:     Thyroid: No thyromegaly.     Trachea: No tracheal deviation.  Cardiovascular:     Rate and Rhythm: Normal rate.     Pulses:          Dorsalis pedis pulses are 1+ on the right side and 1+ on the left side.       Posterior tibial pulses are 1+ on the right side and 1+ on the left side.     Heart sounds: Normal heart sounds, S1 normal and S2 normal. No murmur heard.    No gallop.  Pulmonary:     Effort: Pulmonary effort is normal. No respiratory distress.     Breath sounds: Wheezing present.  Abdominal:     General: Bowel sounds are normal. There is no distension.     Palpations: Abdomen is soft.     Tenderness: There is no abdominal tenderness. There is no guarding.  Musculoskeletal:     Right shoulder: No swelling or deformity.     Cervical back: Normal range of motion and neck supple.  Skin:    General: Skin is warm and dry.     Findings: No erythema or rash.     Nails: There is no clubbing.  Neurological:     Mental Status: He is alert and oriented to person, place, and time.     Cranial Nerves: No cranial nerve deficit.     Sensory: No sensory deficit.     Gait: Gait normal.     Deep Tendon Reflexes: Reflexes are normal and symmetric.  Psychiatric:        Speech: Speech normal.        Behavior: Behavior normal. Behavior is cooperative.        Thought Content: Thought content normal.        Judgment: Judgment normal.       CMP ( most  recent) CMP     Component Value Date/Time   NA 134 12/29/2022 0905   K 4.8 12/29/2022 0905   CL 98 12/29/2022 0905   CO2 22 12/29/2022 0905   GLUCOSE 173 (H) 12/29/2022 0905   GLUCOSE 177 (H) 02/17/2022 0417   BUN 23 12/29/2022 0905   CREATININE 1.69 (H) 12/29/2022 0905   CALCIUM 9.2 12/29/2022 0905   PROT 6.5 12/29/2022 0905   ALBUMIN 4.2 12/29/2022 0905   AST 10 12/29/2022 0905   ALT 13 12/29/2022 0905   ALKPHOS 61 12/29/2022 0905   BILITOT 0.6 12/29/2022 0905   GFRNONAA 59 (L)  02/17/2022 0417     Diabetic Labs (most recent): Lab Results  Component Value Date   HGBA1C 8.7 (H) 12/29/2022   HGBA1C 8.0 (H) 02/14/2022     Lipid Panel ( most recent) Lipid Panel     Component Value Date/Time   CHOL 144 12/29/2022 0905   TRIG 218 (H) 12/29/2022 0905   HDL 35 (L) 12/29/2022 0905   CHOLHDL 4.1 12/29/2022 0905   CHOLHDL 5.7 02/15/2022 0406   VLDL UNABLE TO CALCULATE IF TRIGLYCERIDE OVER 400 mg/dL 02/15/2022 0406   LDLCALC 73 12/29/2022 0905   LDLDIRECT 64 05/25/2022 0844   LDLDIRECT 26.0 02/15/2022 0406   LABVLDL 36 12/29/2022 0905      Lab Results  Component Value Date   TSH 1.380 12/29/2022   FREET4 1.34 12/29/2022           Assessment & Plan:   1. DM type 2 causing vascular disease ([place])  - Clayton Peterson has currently uncontrolled symptomatic type 2 DM since  62 years of age,  with most recent A1c of 8.7 %. Recent labs reviewed. - I had a long discussion with him about the possible risk factors and  the pathology behind its diabetes and its complications. -his diabetes is complicated by coronary artery disease, CKD, chronic heavy smoking, comorbid hypertension hyperlipidemia, history of pancreatitis and he remains at a high risk for more acute and chronic complications which include CAD, CVA, CKD, retinopathy, and neuropathy. These are all discussed in detail with him.  - I discussed all available options of managing his diabetes including de-escalation of medications. I have counseled him on Food as Medicine by adopting a Whole Food , Plant Predominant  ( WFPP) nutrition as recommended by [employer] of Lifestyle Medicine. Patient is encouraged to switch to  unprocessed or minimally processed  complex starch, adequate protein intake (mainly plant source), minimal liquid fat, plenty of fruits, and vegetables. -  he is advised to stick to a routine mealtimes to eat 3 complete meals a day and snack only when necessary ( to snack only to correct  hypoglycemia BG <70 day time or <100 at night).   - he acknowledges that there is a room for improvement in his food and drink choices. - Further Specific Suggestion is made for him to avoid simple carbohydrates  from his diet including Cakes, Sweet Desserts, Ice Cream, Soda (diet and regular), Sweet Tea, Candies, Chips, Cookies, Store Bought Juices, Alcohol ,  Artificial Sweeteners,  Coffee Creamer, and "Sugar-free" Products. This will help patient to have more stable blood glucose profile and potentially avoid unintended weight gain.  - he will be scheduled with Jearld Fenton, RDN, CDE for individualized diabetes education.  - I have approached him with the following individualized plan to manage  his diabetes and patient agrees:   -In light of his history of pancreatitis which likely  led to his diabetes, he has limited options to treat his diabetes. - Accordingly, he is advised to discontinue glimepiride for now. -He is advised to adjust his Lantus at 30 units nightly, associated with monitoring of blood glucose at least twice a day-before breakfast and at bedtime every day.  - he is warned not to take insulin without proper monitoring per orders. - Adjustment parameters are given to him for hypo and hyperglycemia in writing. - he is encouraged to call clinic for blood glucose levels less than 70 or above 200 mg /dl. -He is advised to lower his metformin to 500 mg p.o. twice daily.  He will continue to benefit from low-dose Jardiance, 10 mg p.o. daily at breakfast. -Patient will be considered for a CGM next visit.  He is not a suitable candidate for GLP-1 receptor agonist due to his problematic history of pancreatitis.  If he presents with significant postprandial hyperglycemic burden, he will be considered for prandial insulin.  - Specific targets for  A1c;  LDL, HDL,  and Triglycerides were discussed with the patient.  2) Blood Pressure /Hypertension:  his blood pressure is  controlled  to target.   he is advised to continue his current medications including Lisinopril 20 mg p.o. daily with breakfast . 3) Lipids/Hyperlipidemia:   Review of his recent lipid panel showed controlled  LDL at  73.  he  is advised to continue   crestor 40  mg daily at bedtime.  Side effects and precautions discussed with him.  4)  Weight/Diet:  Body mass index is 31.01 kg/m.  -     he is a candidate for modest weight loss. I discussed with him the fact that loss of 5 - 10% of his  current body weight will have the most impact on his diabetes management.  The above detailed  ACLM recommendations for nutrition, exercise, sleep, social life, avoidance of risky substances, the need for restorative sleep   information will also detailed on discharge instructions. Patient is on Creon due to previous pancreatitis.  5) Chronic Care/Health Maintenance:  -he  is on ACEI/ARB and Statin medications and  is encouraged to initiate and continue to follow up with Ophthalmology, Dentist,  Podiatrist at least yearly or according to recommendations, and advised to quit smoking. I have recommended yearly flu vaccine and pneumonia vaccine at least every 5 years; moderate intensity exercise for up to 150 minutes weekly; and  sleep for 7- 9 hours a day.  The patient was counseled on the dangers of tobacco use, and was advised to quit.  Reviewed strategies to maximize success, including removing cigarettes and smoking materials from environment.   - he is  advised to maintain close follow up with Del Eli Hose, FNP for primary care needs, as well as his other providers for optimal and coordinated care.   I spent 62 minutes in the care of the patient today including review of labs from [facility], Lipids, Thyroid Function, Hematology (current and previous including abstractions from other facilities); face-to-face time discussing  his blood glucose readings/logs, discussing hypoglycemia and hyperglycemia episodes and  symptoms, medications doses, his options of short and long term treatment based on the latest standards of care / guidelines;  discussion about incorporating lifestyle medicine;  and documenting the encounter. Risk reduction counseling performed per USPSTF guidelines to reduce obesity and cardiovascular risk factors.      Please refer to Patient Instructions for Blood Glucose Monitoring and Insulin/Medications Dosing Guide"  in media  tab for additional information. Please  also refer to " Patient Self Inventory" in the Media  tab for reviewed elements of pertinent patient history.  Clayton Peterson participated in the discussions, expressed understanding, and voiced agreement with the above plans.  All questions were answered to his satisfaction. he is encouraged to contact clinic should he have any questions or concerns prior to his return visit.   Follow up plan: - Return in about 3 months (around 04/17/2023) for F/U with Pre-visit Labs, Meter/CGM/Logs, A1c here.  Clayton Lloyd, MD [facility] [address] [place], [place] [address] Phone: [phone]  Fax: [phone]    01/15/2023, 1:11 PM  This note was partially dictated with voice recognition software. Similar sounding words can be transcribed inadequately or may not  be corrected upon review.

## 2023-01-24 ENCOUNTER — Encounter: Payer: Self-pay | Admitting: Internal Medicine

## 2023-01-24 ENCOUNTER — Ambulatory Visit (INDEPENDENT_AMBULATORY_CARE_PROVIDER_SITE_OTHER): Payer: PPO | Admitting: Internal Medicine

## 2023-01-24 ENCOUNTER — Telehealth: Payer: Self-pay

## 2023-01-24 VITALS — BP 128/74 | Temp 97.9°F | Ht 69.0 in | Wt 208.0 lb

## 2023-01-24 DIAGNOSIS — Z8719 Personal history of other diseases of the digestive system: Secondary | ICD-10-CM | POA: Diagnosis not present

## 2023-01-24 DIAGNOSIS — K863 Pseudocyst of pancreas: Secondary | ICD-10-CM | POA: Diagnosis not present

## 2023-01-24 DIAGNOSIS — R1013 Epigastric pain: Secondary | ICD-10-CM | POA: Diagnosis not present

## 2023-01-24 DIAGNOSIS — D123 Benign neoplasm of transverse colon: Secondary | ICD-10-CM

## 2023-01-24 NOTE — Patient Instructions (Addendum)
I am going to increase your pantoprazole to twice daily.  Okay to stop Creon.  For your constipation, I want you to start taking over the counter MiraLAX 1 capful daily.  If this does not adequately control your constipation, I would increase to 2 capfuls daily.  If this is still not adequate, then I would add on once daily Dulcolax (bisacodyl) tablet.   Be sure to drink at least 4 to 6 glasses of water daily.   Follow-up in 2 to 3 months.  If your abdominal pain is not improved, we will consider upper endoscopy to further evaluate.  It was very nice seeing both of you again discussed patient's mother.  Dr. Abbey Chatters

## 2023-01-24 NOTE — Telephone Encounter (Signed)
Left a message requesting pt return call to the office. 

## 2023-01-24 NOTE — Progress Notes (Signed)
Referring Provider: Alanson Puls [facility] Primary Care Physician:  Juanda Chance, FNP Primary GI:  Dr. Abbey Chatters  Chief Complaint  Patient presents with   Abdominal Pain    Follow up. Having some epigastic pain and bloating after eating and drinking.  Not able to afford creon. Cost over $700.     HPI:   Clayton Peterson is a 62 y.o. male who presents to clinic today for follow-up visit. He has a history of adenomatous colon polyps, GERD, gastritis, duodenal ulcer in the setting of NSAIDs in August 2022, pancreatitis in April 2023.   Etiology of pancreatitis unclear, cannot rule out secondary to hypertriglyceridemia with levels at 709.  He was on omeprazole chronically at that time which is a class II medication with potential to cause pancreatitis, was switched to pantoprazole given this possibility.  Follow-up CT abdomen pelvis 12/12/2022: Pancreatic tail cystic lesion (pseudocyst) still present 1/2 of the size it was in 04/2022 Stable chronic splenic vein thrombosis. Stable since 04/2022 CT. Fatty liver. Left kidney stones. Previous AAA repair. 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  Currently taking pantoprazole 40 mg once daily.  Was started on pancreatic enzyme replacement though unable to afford Creon refills, states this has not helped.   EGD 06/03/2021: Gastritis biopsied, nonobstructing, nonbleeding duodenal ulcer with no stigmata of bleeding, NSAID induced etiology.  Pathology with chronic gastritis, negative for H. pylori.    Colonoscopy 06/03/2021: Nonbleeding internal hemorrhoids, 6 mm polyp in sigmoid colon, 2 mm polyp in the ascending colon, congested and erythematous mucosa in the cecum biopsied.  Pathology revealed 2 tubular adenomas, cecal biopsy with polypoid colonic mucosa with ectatic vascular channels with small vascular malformation not ruled out, negative for dysplasia or malignancy.  Repeat in 5  years.  Today, continues to struggle with epigastric pain.  Primarily after meals.  Notes associated bloating and gas.  Some lower quadrant pain as well.  States he is constipated at times going a day or 2 without bowel movements.  When this happens he has lower left quadrant abdominal pain.  No melena hematochezia.  Past Medical History:  Diagnosis Date   AAA (abdominal aortic aneurysm) ([place])    Coronary artery disease    Diabetes mellitus without complication ([place])    Heart attack ([place])    High cholesterol    Hypertension     Past Surgical History:  Procedure Laterality Date   ABDOMINAL AORTIC ANEURYSM REPAIR     BACK SURGERY     x2   BIOPSY  06/03/2021   Procedure: BIOPSY;  Surgeon: Eloise Harman, DO;  Location: [facility];  Service: Endoscopy;;   CARDIAC CATHETERIZATION     stent placed on right side ofo heart   COLONOSCOPY WITH PROPOFOL N/A 06/03/2021   Surgeon: Eloise Harman, DO; Nonbleeding internal hemorrhoids, 6 mm polyp in sigmoid colon, 2 mm polyp in the ascending colon, congested and erythematous mucosa in the cecum biopsied.  Pathology revealed 2 tubular adenomas, cecal biopsy with polypoid colonic mucosa with ectatic vascular channels with small vascular malformation not ruled out. Repeat in 5 years.   ESOPHAGOGASTRODUODENOSCOPY (EGD) WITH PROPOFOL N/A 06/03/2021   Surgeon: Eloise Harman, DO; Gastritis biopsied, nonobstructing, nonbleeding duodenal ulcer with no stigmata of bleeding, NSAID induced etiology.  Pathology with chronic gastritis, negative for H. pylori.   KNEE SURGERY Left    PARTIAL COLECTOMY     POLYPECTOMY  06/03/2021   Procedure: POLYPECTOMY INTESTINAL;  Surgeon: Eloise Harman, DO;  Location: [facility];  Service: Endoscopy;;    Current Outpatient Medications  Medication Sig Dispense Refill   ACCU-CHEK GUIDE test strip USE 1 STRIP TO CHECK GLUCOSE THREE TIMES DAILY 100 each 3   acetaminophen (TYLENOL) 325 MG tablet Take 325 mg by  mouth every 4 (four) hours as needed for pain.     aspirin 81 MG EC tablet Take 81 mg by mouth daily.     Cholecalciferol (VITAMIN D3) 125 MCG (5000 UT) TABS Take 1 tablet by mouth daily.     empagliflozin (JARDIANCE) 10 MG TABS tablet Take 1 tablet (10 mg total) by mouth daily before breakfast. 30 tablet 2   fenofibrate micronized (LOFIBRA) 200 MG capsule Take 1 capsule (200 mg total) by mouth daily before breakfast. 30 capsule 11   gabapentin (NEURONTIN) 100 MG capsule Take 100 mg by mouth daily.     insulin glargine (LANTUS) 100 UNIT/ML injection Inject 30 Units into the skin daily. 20 units AM, 20 units PM     lipase/protease/amylase (CREON) 36000 UNITS CPEP capsule Take two with meals and 1 with snacks. Take with first bite of food. 240 capsule 5   lisinopril (ZESTRIL) 20 MG tablet Take 20 mg by mouth daily.     metFORMIN (GLUCOPHAGE) 1000 MG tablet Take 500 mg by mouth 2 (two) times daily with a meal.     metoprolol tartrate (LOPRESSOR) 50 MG tablet Take 1 tablet (50 mg total) by mouth 2 (two) times daily. 60 tablet 3   nitroGLYCERIN (NITROSTAT) 0.4 MG SL tablet Place 1 tablet (0.4 mg total) under the tongue every 5 (five) minutes as needed for chest pain. 50 tablet 3   omega-3 acid ethyl esters (LOVAZA) 1 g capsule Take 2 capsules (2 g total) by mouth 2 (two) times daily. 120 capsule 3   pantoprazole (PROTONIX) 40 MG tablet Take 1 tablet (40 mg total) by mouth 2 (two) times daily before a meal. 180 tablet 1   RELION PEN NEEDLE 31G/8MM 31G X 8 MM MISC USE 1 PEN NEEDLE TWICE DAILY FOR DIABETES (Patient not taking: Reported on 12/28/2022)     rosuvastatin (CRESTOR) 40 MG tablet Take 40 mg by mouth daily.     No current facility-administered medications for this visit.    Allergies as of 01/24/2023 - Review Complete 01/24/2023  Allergen Reaction Noted   Bee venom Anaphylaxis 05/22/2021   Coconut (cocos nucifera) Anaphylaxis 05/22/2021    Family History  Problem Relation Age of Onset    Atrial fibrillation Mother    Alzheimer's disease Mother    Diabetes Brother    Alzheimer's disease Maternal Grandmother    Aneurysm Maternal Grandfather    Pancreatitis Neg Hx    Pancreatic cancer Neg Hx    Colon cancer Neg Hx     Social History   Socioeconomic History   Marital status: Married    Spouse name: Not on file   Number of children: Not on file   Years of education: Not on file   Highest education level: Not on file  Occupational History   Not on file  Tobacco Use   Smoking status: Every Day    Packs/day: 1    Types: Cigarettes    Passive exposure: Never   Smokeless tobacco: Never  Vaping Use   Vaping Use: Never used  Substance and Sexual Activity   Alcohol use: Yes    Comment: very seldom  Drug use: Never   Sexual activity: Yes  Other Topics Concern   Not on file  Social History Narrative   Not on file   Social Determinants of Health   Financial Resource Strain: Not on file  Food Insecurity: Not on file  Transportation Needs: Not on file  Physical Activity: Not on file  Stress: Not on file  Social Connections: Not on file    Subjective: Review of Systems  Constitutional:  Negative for chills and fever.  HENT:  Negative for congestion and hearing loss.   Eyes:  Negative for blurred vision and double vision.  Respiratory:  Negative for cough and shortness of breath.   Cardiovascular:  Negative for chest pain and palpitations.  Gastrointestinal:  Positive for abdominal pain. Negative for blood in stool, constipation, diarrhea, heartburn, melena and vomiting.  Genitourinary:  Negative for dysuria and urgency.  Musculoskeletal:  Negative for joint pain and myalgias.  Skin:  Negative for itching and rash.  Neurological:  Negative for dizziness and headaches.  Psychiatric/Behavioral:  Negative for depression. The patient is not nervous/anxious.      Objective: There were no vitals taken for this visit. Physical Exam Constitutional:       Appearance: Normal appearance.  HENT:     Head: Normocephalic and atraumatic.  Eyes:     Extraocular Movements: Extraocular movements intact.     Conjunctiva/sclera: Conjunctivae normal.  Cardiovascular:     Rate and Rhythm: Normal rate and regular rhythm.  Pulmonary:     Effort: Pulmonary effort is normal.     Breath sounds: Normal breath sounds.  Abdominal:     General: Bowel sounds are normal.     Palpations: Abdomen is soft.  Musculoskeletal:        General: Normal range of motion.     Cervical back: Normal range of motion and neck supple.  Skin:    General: Skin is warm.  Neurological:     General: No focal deficit present.     Mental Status: He is alert and oriented to person, place, and time.  Psychiatric:        Mood and Affect: Mood normal.        Behavior: Behavior normal.      Assessment: *Pancreatitis with pseudocyst formation *Epigastric pain *Peptic ulcer disease *Chronic GERD  Plan: Discussed recent CT in depth with him and his wife today.  Showed them images.  Pseudocyst is improving in size.  Etiology of his epigastric pain unclear.  Possibly due to pseudocyst though this should steadily improve over time.  Also history of peptic ulcer disease.  Will increase pantoprazole to twice daily.  Avoid NSAIDs.  No improvement on Creon.  Will discontinue.  Also with mild constipation today. I recommended taking MiraLAX 1 capful daily.  If this is not adequate then increase to 2 capfuls daily.  If this is still not adequate then add on once daily Dulcolax.  Encouraged to drink at least 6 glasses of water daily.  Recommended he quit smoking.  Follow-up in 2 to 3 months, if not improved, will proceed with upper endoscopy to further evaluate.   01/24/2023 1:23 PM   Disclaimer: This note was dictated with voice recognition software. Similar sounding words can inadvertently be transcribed and may not be corrected upon review.

## 2023-01-26 ENCOUNTER — Other Ambulatory Visit: Payer: Self-pay | Admitting: Family Medicine

## 2023-01-26 DIAGNOSIS — Z794 Long term (current) use of insulin: Secondary | ICD-10-CM

## 2023-02-06 ENCOUNTER — Emergency Department (HOSPITAL_COMMUNITY): Payer: PPO

## 2023-02-06 ENCOUNTER — Encounter (HOSPITAL_COMMUNITY): Payer: Self-pay

## 2023-02-06 ENCOUNTER — Other Ambulatory Visit: Payer: Self-pay

## 2023-02-06 ENCOUNTER — Inpatient Hospital Stay (HOSPITAL_COMMUNITY)
Admission: EM | Admit: 2023-02-06 | Discharge: 2023-02-15 | DRG: 286 | Disposition: A | Discharge status: Home / Self Care | Payer: PPO | Attending: Family Medicine | Admitting: Family Medicine

## 2023-02-06 DIAGNOSIS — E669 Obesity, unspecified: Secondary | ICD-10-CM | POA: Diagnosis present

## 2023-02-06 DIAGNOSIS — Z91030 Bee allergy status: Secondary | ICD-10-CM

## 2023-02-06 DIAGNOSIS — E1122 Type 2 diabetes mellitus with diabetic chronic kidney disease: Secondary | ICD-10-CM | POA: Diagnosis present

## 2023-02-06 DIAGNOSIS — Z7984 Long term (current) use of oral hypoglycemic drugs: Secondary | ICD-10-CM

## 2023-02-06 DIAGNOSIS — K219 Gastro-esophageal reflux disease without esophagitis: Secondary | ICD-10-CM

## 2023-02-06 DIAGNOSIS — G4733 Obstructive sleep apnea (adult) (pediatric): Secondary | ICD-10-CM

## 2023-02-06 DIAGNOSIS — G8929 Other chronic pain: Secondary | ICD-10-CM

## 2023-02-06 DIAGNOSIS — N289 Disorder of kidney and ureter, unspecified: Secondary | ICD-10-CM

## 2023-02-06 DIAGNOSIS — Z794 Long term (current) use of insulin: Secondary | ICD-10-CM

## 2023-02-06 DIAGNOSIS — K2289 Other specified disease of esophagus: Secondary | ICD-10-CM | POA: Diagnosis present

## 2023-02-06 DIAGNOSIS — Z8679 Personal history of other diseases of the circulatory system: Secondary | ICD-10-CM

## 2023-02-06 DIAGNOSIS — R1013 Epigastric pain: Secondary | ICD-10-CM

## 2023-02-06 DIAGNOSIS — K449 Diaphragmatic hernia without obstruction or gangrene: Secondary | ICD-10-CM | POA: Diagnosis present

## 2023-02-06 DIAGNOSIS — I25110 Atherosclerotic heart disease of native coronary artery with unstable angina pectoris: Secondary | ICD-10-CM | POA: Diagnosis not present

## 2023-02-06 DIAGNOSIS — E871 Hypo-osmolality and hyponatremia: Secondary | ICD-10-CM | POA: Diagnosis present

## 2023-02-06 DIAGNOSIS — E872 Acidosis, unspecified: Secondary | ICD-10-CM | POA: Diagnosis present

## 2023-02-06 DIAGNOSIS — K859 Acute pancreatitis without necrosis or infection, unspecified: Secondary | ICD-10-CM | POA: Diagnosis present

## 2023-02-06 DIAGNOSIS — Z79899 Other long term (current) drug therapy: Secondary | ICD-10-CM

## 2023-02-06 DIAGNOSIS — Z955 Presence of coronary angioplasty implant and graft: Secondary | ICD-10-CM

## 2023-02-06 DIAGNOSIS — N1832 Chronic kidney disease, stage 3b: Secondary | ICD-10-CM | POA: Diagnosis present

## 2023-02-06 DIAGNOSIS — K760 Fatty (change of) liver, not elsewhere classified: Secondary | ICD-10-CM | POA: Diagnosis present

## 2023-02-06 DIAGNOSIS — R079 Chest pain, unspecified: Secondary | ICD-10-CM | POA: Diagnosis not present

## 2023-02-06 DIAGNOSIS — N189 Chronic kidney disease, unspecified: Secondary | ICD-10-CM | POA: Diagnosis present

## 2023-02-06 DIAGNOSIS — Z833 Family history of diabetes mellitus: Secondary | ICD-10-CM

## 2023-02-06 DIAGNOSIS — I129 Hypertensive chronic kidney disease with stage 1 through stage 4 chronic kidney disease, or unspecified chronic kidney disease: Secondary | ICD-10-CM | POA: Diagnosis present

## 2023-02-06 DIAGNOSIS — I2510 Atherosclerotic heart disease of native coronary artery without angina pectoris: Secondary | ICD-10-CM | POA: Diagnosis not present

## 2023-02-06 DIAGNOSIS — I959 Hypotension, unspecified: Secondary | ICD-10-CM | POA: Diagnosis not present

## 2023-02-06 DIAGNOSIS — K259 Gastric ulcer, unspecified as acute or chronic, without hemorrhage or perforation: Secondary | ICD-10-CM

## 2023-02-06 DIAGNOSIS — K862 Cyst of pancreas: Secondary | ICD-10-CM | POA: Diagnosis present

## 2023-02-06 DIAGNOSIS — Z9189 Other specified personal risk factors, not elsewhere classified: Secondary | ICD-10-CM | POA: Diagnosis not present

## 2023-02-06 DIAGNOSIS — Z7982 Long term (current) use of aspirin: Secondary | ICD-10-CM

## 2023-02-06 DIAGNOSIS — I252 Old myocardial infarction: Secondary | ICD-10-CM

## 2023-02-06 DIAGNOSIS — F17210 Nicotine dependence, cigarettes, uncomplicated: Secondary | ICD-10-CM | POA: Diagnosis present

## 2023-02-06 DIAGNOSIS — Z6829 Body mass index (BMI) 29.0-29.9, adult: Secondary | ICD-10-CM

## 2023-02-06 DIAGNOSIS — K7460 Unspecified cirrhosis of liver: Secondary | ICD-10-CM | POA: Diagnosis present

## 2023-02-06 DIAGNOSIS — Z6841 Body Mass Index (BMI) 40.0 and over, adult: Secondary | ICD-10-CM

## 2023-02-06 DIAGNOSIS — Z820 Family history of epilepsy and other diseases of the nervous system: Secondary | ICD-10-CM

## 2023-02-06 DIAGNOSIS — Z87898 Personal history of other specified conditions: Secondary | ICD-10-CM

## 2023-02-06 DIAGNOSIS — I10 Essential (primary) hypertension: Secondary | ICD-10-CM | POA: Diagnosis present

## 2023-02-06 DIAGNOSIS — E785 Hyperlipidemia, unspecified: Secondary | ICD-10-CM | POA: Diagnosis present

## 2023-02-06 DIAGNOSIS — E1159 Type 2 diabetes mellitus with other circulatory complications: Secondary | ICD-10-CM | POA: Diagnosis present

## 2023-02-06 DIAGNOSIS — E782 Mixed hyperlipidemia: Secondary | ICD-10-CM | POA: Diagnosis present

## 2023-02-06 DIAGNOSIS — K3189 Other diseases of stomach and duodenum: Secondary | ICD-10-CM | POA: Diagnosis present

## 2023-02-06 DIAGNOSIS — I82891 Chronic embolism and thrombosis of other specified veins: Secondary | ICD-10-CM | POA: Diagnosis present

## 2023-02-06 DIAGNOSIS — K863 Pseudocyst of pancreas: Secondary | ICD-10-CM | POA: Diagnosis present

## 2023-02-06 DIAGNOSIS — I714 Abdominal aortic aneurysm, without rupture, unspecified: Secondary | ICD-10-CM | POA: Diagnosis present

## 2023-02-06 LAB — I-STAT CHEM 8, ED
BUN: 27 mg/dL — ABNORMAL HIGH (ref 8–23)
Calcium, Ion: 1.16 mmol/L (ref 1.15–1.40)
Chloride: 104 mmol/L (ref 98–111)
Creatinine, Ser: 1.7 mg/dL — ABNORMAL HIGH (ref 0.61–1.24)
Glucose, Bld: 199 mg/dL — ABNORMAL HIGH (ref 70–99)
HCT: 45 % (ref 39.0–52.0)
Hemoglobin: 15.3 g/dL (ref 13.0–17.0)
Potassium: 4.2 mmol/L (ref 3.5–5.1)
Sodium: 135 mmol/L (ref 135–145)
TCO2: 20 mmol/L — ABNORMAL LOW (ref 22–32)

## 2023-02-06 LAB — BASIC METABOLIC PANEL
Anion gap: 14 (ref 5–15)
BUN: 23 mg/dL (ref 8–23)
CO2: 17 mmol/L — ABNORMAL LOW (ref 22–32)
Calcium: 9.3 mg/dL (ref 8.9–10.3)
Chloride: 101 mmol/L (ref 98–111)
Creatinine, Ser: 1.65 mg/dL — ABNORMAL HIGH (ref 0.61–1.24)
GFR, Estimated: 47 mL/min — ABNORMAL LOW (ref >60)
Glucose, Bld: 201 mg/dL — ABNORMAL HIGH (ref 70–99)
Potassium: 4 mmol/L (ref 3.5–5.1)
Sodium: 132 mmol/L — ABNORMAL LOW (ref 135–145)

## 2023-02-06 LAB — CBC
HCT: 44 % (ref 39.0–52.0)
Hemoglobin: 15.2 g/dL (ref 13.0–17.0)
MCH: 29.2 pg (ref 26.0–34.0)
MCHC: 34.5 g/dL (ref 30.0–36.0)
MCV: 84.6 fL (ref 80.0–100.0)
Platelets: 197 10*3/uL (ref 150–400)
RBC: 5.2 MIL/uL (ref 4.22–5.81)
RDW: 14.7 % (ref 11.5–15.5)
WBC: 10.9 10*3/uL — ABNORMAL HIGH (ref 4.0–10.5)
nRBC: 0 % (ref 0.0–0.2)

## 2023-02-06 LAB — CBG MONITORING, ED
Glucose-Capillary: 190 mg/dL — ABNORMAL HIGH (ref 70–99)
Glucose-Capillary: 214 mg/dL — ABNORMAL HIGH (ref 70–99)

## 2023-02-06 LAB — APTT: aPTT: 43 seconds — ABNORMAL HIGH (ref 24–36)

## 2023-02-06 LAB — TROPONIN I (HIGH SENSITIVITY)
Troponin I (High Sensitivity): 7 ng/L (ref <18)
Troponin I (High Sensitivity): 7 ng/L (ref <18)
Troponin I (High Sensitivity): 7 ng/L (ref <18)

## 2023-02-06 LAB — HEPARIN LEVEL (UNFRACTIONATED): Heparin Unfractionated: <0.1 IU/mL — ABNORMAL LOW (ref 0.30–0.70)

## 2023-02-06 LAB — LIPASE, BLOOD: Lipase: 48 U/L (ref 11–51)

## 2023-02-06 MED ORDER — SODIUM CHLORIDE 0.9 % WEIGHT BASED INFUSION
1.0000 mL/kg/h | INTRAVENOUS | Status: DC
  Administered 2023-02-07 (×2): 1 mL/kg/h via INTRAVENOUS

## 2023-02-06 MED ORDER — ASPIRIN 81 MG PO TBEC
81.0000 mg | DELAYED_RELEASE_TABLET | Freq: Every day | ORAL | Status: DC
  Administered 2023-02-07 – 2023-02-15 (×9): 81 mg via ORAL
  Filled 2023-02-06 (×9): qty 1

## 2023-02-06 MED ORDER — ENOXAPARIN SODIUM 40 MG/0.4ML IJ SOSY
40.0000 mg | PREFILLED_SYRINGE | Freq: Every day | INTRAMUSCULAR | Status: DC
  Administered 2023-02-06: 40 mg via SUBCUTANEOUS
  Filled 2023-02-06: qty 0.4

## 2023-02-06 MED ORDER — GABAPENTIN 100 MG PO CAPS
100.0000 mg | ORAL_CAPSULE | Freq: Every day | ORAL | Status: DC
  Administered 2023-02-06 – 2023-02-15 (×10): 100 mg via ORAL
  Filled 2023-02-06 (×10): qty 1

## 2023-02-06 MED ORDER — METOPROLOL TARTRATE 50 MG PO TABS
50.0000 mg | ORAL_TABLET | Freq: Two times a day (BID) | ORAL | Status: DC
  Administered 2023-02-06 – 2023-02-15 (×16): 50 mg via ORAL
  Filled 2023-02-06 (×9): qty 1
  Filled 2023-02-06: qty 2
  Filled 2023-02-06: qty 1
  Filled 2023-02-06: qty 2
  Filled 2023-02-06 (×7): qty 1

## 2023-02-06 MED ORDER — HEPARIN BOLUS VIA INFUSION
2500.0000 [IU] | Freq: Once | INTRAVENOUS | Status: AC
  Administered 2023-02-07: 2500 [IU] via INTRAVENOUS
  Filled 2023-02-06: qty 2500

## 2023-02-06 MED ORDER — HYDROMORPHONE HCL 1 MG/ML IJ SOLN
1.0000 mg | Freq: Once | INTRAMUSCULAR | Status: AC
  Administered 2023-02-06: 1 mg via INTRAVENOUS
  Filled 2023-02-06: qty 1

## 2023-02-06 MED ORDER — SODIUM CHLORIDE 0.9 % IV SOLN: 250.0000 mL | INTRAVENOUS | Status: DC | PRN

## 2023-02-06 MED ORDER — ROSUVASTATIN CALCIUM 20 MG PO TABS
40.0000 mg | ORAL_TABLET | Freq: Every day | ORAL | Status: DC
  Administered 2023-02-06 – 2023-02-15 (×10): 40 mg via ORAL
  Filled 2023-02-06 (×10): qty 2

## 2023-02-06 MED ORDER — INSULIN ASPART 100 UNIT/ML IJ SOLN
0.0000 [IU] | Freq: Every day | INTRAMUSCULAR | Status: DC
  Administered 2023-02-06 – 2023-02-12 (×4): 2 [IU] via SUBCUTANEOUS
  Administered 2023-02-13: 3 [IU] via SUBCUTANEOUS

## 2023-02-06 MED ORDER — SODIUM CHLORIDE 0.9% FLUSH: 3.0000 mL | INTRAVENOUS | Status: DC | PRN

## 2023-02-06 MED ORDER — ONDANSETRON HCL 4 MG/2ML IJ SOLN
4.0000 mg | Freq: Once | INTRAMUSCULAR | Status: AC
  Administered 2023-02-06: 4 mg via INTRAVENOUS
  Filled 2023-02-06: qty 2

## 2023-02-06 MED ORDER — IOHEXOL 350 MG/ML SOLN
75.0000 mL | Freq: Once | INTRAVENOUS | Status: AC | PRN
  Administered 2023-02-06: 75 mL via INTRAVENOUS

## 2023-02-06 MED ORDER — INSULIN ASPART 100 UNIT/ML IJ SOLN
0.0000 [IU] | Freq: Three times a day (TID) | INTRAMUSCULAR | Status: DC
  Administered 2023-02-06 – 2023-02-07 (×2): 3 [IU] via SUBCUTANEOUS
  Administered 2023-02-07: 2 [IU] via SUBCUTANEOUS
  Administered 2023-02-08 (×2): 5 [IU] via SUBCUTANEOUS
  Administered 2023-02-09: 3 [IU] via SUBCUTANEOUS
  Administered 2023-02-10 (×2): 2 [IU] via SUBCUTANEOUS
  Administered 2023-02-10 – 2023-02-11 (×2): 5 [IU] via SUBCUTANEOUS
  Administered 2023-02-11: 3 [IU] via SUBCUTANEOUS
  Administered 2023-02-12: 5 [IU] via SUBCUTANEOUS
  Administered 2023-02-12: 2 [IU] via SUBCUTANEOUS
  Administered 2023-02-13 – 2023-02-14 (×4): 3 [IU] via SUBCUTANEOUS
  Administered 2023-02-14: 5 [IU] via SUBCUTANEOUS
  Administered 2023-02-15: 2 [IU] via SUBCUTANEOUS

## 2023-02-06 MED ORDER — FENOFIBRATE 160 MG PO TABS
160.0000 mg | ORAL_TABLET | Freq: Every day | ORAL | Status: DC
  Administered 2023-02-07 – 2023-02-15 (×9): 160 mg via ORAL
  Filled 2023-02-06 (×10): qty 1

## 2023-02-06 MED ORDER — NITROGLYCERIN IN D5W 200-5 MCG/ML-% IV SOLN
0.0000 ug/min | INTRAVENOUS | Status: DC
  Administered 2023-02-06: 5 ug/min via INTRAVENOUS
  Filled 2023-02-06: qty 250

## 2023-02-06 MED ORDER — ACETAMINOPHEN 325 MG PO TABS
650.0000 mg | ORAL_TABLET | ORAL | Status: DC | PRN
  Administered 2023-02-06 – 2023-02-07 (×2): 650 mg via ORAL
  Filled 2023-02-06 (×2): qty 2

## 2023-02-06 MED ORDER — SODIUM CHLORIDE 0.9% FLUSH
3.0000 mL | Freq: Two times a day (BID) | INTRAVENOUS | Status: DC
  Administered 2023-02-06 – 2023-02-15 (×9): 3 mL via INTRAVENOUS

## 2023-02-06 MED ORDER — FENOFIBRATE 200 MG PO CAPS: 200.0000 mg | ORAL_CAPSULE | Freq: Every day | ORAL | Status: DC

## 2023-02-06 MED ORDER — HEPARIN (PORCINE) 25000 UT/250ML-% IV SOLN
1500.0000 [IU]/h | INTRAVENOUS | Status: DC
  Administered 2023-02-06: 1100 [IU]/h via INTRAVENOUS
  Administered 2023-02-07: 1500 [IU]/h via INTRAVENOUS
  Filled 2023-02-06 (×2): qty 250

## 2023-02-06 MED ORDER — INSULIN GLARGINE-YFGN 100 UNIT/ML ~~LOC~~ SOLN: 30.0000 [IU] | Freq: Every day | SUBCUTANEOUS | Status: DC

## 2023-02-06 MED ORDER — PANTOPRAZOLE SODIUM 40 MG PO TBEC
40.0000 mg | DELAYED_RELEASE_TABLET | Freq: Two times a day (BID) | ORAL | Status: DC
  Administered 2023-02-06 – 2023-02-13 (×15): 40 mg via ORAL
  Filled 2023-02-06 (×15): qty 1

## 2023-02-06 MED ORDER — NICOTINE 21 MG/24HR TD PT24
21.0000 mg | MEDICATED_PATCH | Freq: Every day | TRANSDERMAL | Status: DC
  Administered 2023-02-06 – 2023-02-15 (×10): 21 mg via TRANSDERMAL
  Filled 2023-02-06 (×10): qty 1

## 2023-02-06 MED ORDER — HEPARIN BOLUS VIA INFUSION
2000.0000 [IU] | Freq: Once | INTRAVENOUS | Status: AC
  Administered 2023-02-06: 2000 [IU] via INTRAVENOUS
  Filled 2023-02-06: qty 2000

## 2023-02-06 MED ORDER — ONDANSETRON HCL 4 MG/2ML IJ SOLN: 4.0000 mg | Freq: Four times a day (QID) | INTRAMUSCULAR | Status: DC | PRN

## 2023-02-06 MED ORDER — INSULIN GLARGINE-YFGN 100 UNIT/ML ~~LOC~~ SOLN
30.0000 [IU] | Freq: Every day | SUBCUTANEOUS | Status: DC
  Administered 2023-02-06 – 2023-02-15 (×10): 30 [IU] via SUBCUTANEOUS
  Filled 2023-02-06 (×10): qty 0.3

## 2023-02-06 MED ORDER — NITROGLYCERIN 0.4 MG SL SUBL
0.4000 mg | SUBLINGUAL_TABLET | SUBLINGUAL | Status: DC | PRN
  Administered 2023-02-06 (×3): 0.4 mg via SUBLINGUAL
  Filled 2023-02-06 (×2): qty 1

## 2023-02-06 MED ORDER — LISINOPRIL 20 MG PO TABS
20.0000 mg | ORAL_TABLET | Freq: Every day | ORAL | Status: DC
  Administered 2023-02-06 – 2023-02-07 (×2): 20 mg via ORAL
  Filled 2023-02-06 (×2): qty 1

## 2023-02-06 MED ORDER — OMEGA-3-ACID ETHYL ESTERS 1 G PO CAPS
2.0000 g | ORAL_CAPSULE | Freq: Two times a day (BID) | ORAL | Status: DC
  Administered 2023-02-06 – 2023-02-15 (×18): 2 g via ORAL
  Filled 2023-02-06 (×18): qty 2

## 2023-02-06 NOTE — ED Triage Notes (Signed)
Pt to ED via EMS from home. Pt states he awoke from his sleep with 10/10 chest pain. EMS EKG reading STEMI. Pt has hx of AAA. Pt states he took 2 nitros at home. Hx of MI and CAD  EMS: 3 nitros 324 ASA 10 morphine 4L [place] 98%  130/90 98 HR 20 LFA

## 2023-02-06 NOTE — Progress Notes (Signed)
ANTICOAGULATION CONSULT NOTE - Initial Consult  Pharmacy Consult for heparin Indication: chest pain/ACS  Allergies  Allergen Reactions   Bee Venom Anaphylaxis   Coconut (Cocos Nucifera) Anaphylaxis    Patient Measurements: Height:  (175.3 cm) Weight: 91.3 kg (201 lb 4.8 oz) IBW/kg (Calculated) : 70.7 Heparin Dosing Weight: 90.2 kg  Vital Signs: Temp: 98.2 F (36.8 C) (04/16 2256) Temp Source: Oral (04/16 2256) BP: 105/67 (04/16 2256) Pulse Rate: 61 (04/16 2256)  Labs: Recent Labs    02/06/23 0455 02/06/23 0513 02/06/23 0630 02/06/23 1902 02/06/23 2200  HGB 15.2 15.3  --   --   --   HCT 44.0 45.0  --   --   --   PLT 197  --   --   --   --   APTT  --   --   --   --  43*  HEPARINUNFRC  --   --   --   --  <0.10*  CREATININE 1.65* 1.70*  --   --   --   TROPONINIHS 7  --  7 7  --      Estimated Creatinine Clearance: 50.9 mL/min (A) (by C-G formula based on SCr of 1.7 mg/dL (H)).   Medical History: Past Medical History:  Diagnosis Date   AAA (abdominal aortic aneurysm)    Coronary artery disease    Diabetes mellitus without complication    Heart attack    High cholesterol    Hypertension     Medications:  Medications Prior to Admission  Medication Sig Dispense Refill Last Dose   acetaminophen (TYLENOL) 325 MG tablet Take 325 mg by mouth every 4 (four) hours as needed for pain.   Past Week   aspirin 81 MG EC tablet Take 81 mg by mouth daily.   02/06/2023 at 0430   Cholecalciferol (VITAMIN D3) 125 MCG (5000 UT) TABS Take 1 tablet by mouth daily.   02/04/2023   empagliflozin (JARDIANCE) 10 MG TABS tablet Take 1 tablet (10 mg total) by mouth daily before breakfast. 30 tablet 2 02/05/2023   fenofibrate micronized (LOFIBRA) 200 MG capsule Take 1 capsule (200 mg total) by mouth daily before breakfast. 30 capsule 11 02/05/2023   gabapentin (NEURONTIN) 100 MG capsule Take 100 mg by mouth daily.   02/05/2023   insulin glargine (LANTUS) 100 UNIT/ML injection Inject 30  Units into the skin daily.   02/05/2023 at pm   lisinopril (ZESTRIL) 20 MG tablet Take 20 mg by mouth daily.   02/05/2023   metFORMIN (GLUCOPHAGE) 1000 MG tablet Take 500 mg by mouth 2 (two) times daily with a meal.   02/05/2023 at pm   metoprolol tartrate (LOPRESSOR) 50 MG tablet Take 1 tablet (50 mg total) by mouth 2 (two) times daily. 60 tablet 3 02/05/2023 at 1730   nitroGLYCERIN (NITROSTAT) 0.4 MG SL tablet Place 1 tablet (0.4 mg total) under the tongue every 5 (five) minutes as needed for chest pain. 50 tablet 3 02/06/2023 at 0430   omega-3 acid ethyl esters (LOVAZA) 1 g capsule Take 2 capsules (2 g total) by mouth 2 (two) times daily. 120 capsule 3 02/05/2023 at pm   pantoprazole (PROTONIX) 40 MG tablet Take 1 tablet (40 mg total) by mouth 2 (two) times daily before a meal. 180 tablet 1 02/05/2023 at pm   rosuvastatin (CRESTOR) 40 MG tablet Take 40 mg by mouth daily.   02/05/2023 at pm   RELION PEN NEEDLE 31G/8MM 31G X 8 MM MISC  Scheduled:   [START ON 02/07/2023] aspirin EC  81 mg Oral Daily   fenofibrate  160 mg Oral Daily   gabapentin  100 mg Oral Daily   insulin aspart  0-15 Units Subcutaneous TID WC   insulin aspart  0-5 Units Subcutaneous QHS   insulin glargine-yfgn  30 Units Subcutaneous Daily   lisinopril  20 mg Oral Daily   metoprolol tartrate  50 mg Oral BID   nicotine  21 mg Transdermal Daily   omega-3 acid ethyl esters  2 g Oral BID   pantoprazole  40 mg Oral BID AC   rosuvastatin  40 mg Oral Daily   sodium chloride flush  3 mL Intravenous Q12H    Assessment: 62 YO male presenting to ED 4/16 with 10/10 chest pain that resolved with administration of Dilaudid (not responsive to morphine). Patient has a history of MI and CAD with last cath being 2012. No AC PTA. CTA chest found aortic atherosclerosis as well as left main and three-vessel CAD. Cardiology recommending [facility] inpatient if patient continues to have mild chest discomfort. Pharmacy consulted to dose heparin.    Heparin level today is subtherapeutic at <0.10 and aPTT is subtherapeutic at 43 seconds, on 1100 units/hr. Hgb 15.3, plt 197--stable. No line issues or signs/symptoms of bleeding reported. Will rebolus heparin and increase the drip rate ~3mg /kg/hr.    Goal of Therapy:  Heparin level 0.3-0.7 units/ml Monitor platelets by anticoagulation protocol: Yes   Plan:  Give heparin 2500 units IV x1  Start heparin 1300 units/hr  Check heparin level in 6hrs  Monitor heparin level, CBC, and s/sx of bleeding daily    Cherylin Mylar, PharmD PGY1 Pharmacy Resident 4/16/202411:26 PM

## 2023-02-06 NOTE — ED Notes (Signed)
ED TO INPATIENT HANDOFF REPORT  ED Nurse Name and Phone #:  Lowanda Foster ext [phone]  S Name/Age/Gender Clayton Peterson 62 y.o. male Room/Bed: 045C/045C  Code Status   Code Status: Full Code  Home/SNF/Other Home Patient oriented to: self, place, time, and situation Is this baseline? Yes   Triage Complete: Triage complete  Chief Complaint Chest pain [R07.9]  Triage Note Pt to ED via EMS from home. Pt states he awoke from his sleep with 10/10 chest pain. EMS EKG reading STEMI. Pt has hx of AAA. Pt states he took 2 nitros at home. Hx of MI and CAD  EMS: 3 nitros 324 ASA 10 morphine 4L [place]ph[place] 98%  130/90 98 HR 20 LFA    Allergies Allergies  Allergen Reactions   Bee Venom Anaphylaxis   Coconut (Cocos Nucifera) Anaphylaxis    Level of Care/Admitting Diagnosis ED Disposition     ED Disposition  Admit   Condition  --   Comment  Hospital Area: [facility] [[id number]]  Level of Care: Telemetry Cardiac [[id number]]  May place patient in observation at [facility] or [facility] if equivalent level of care is available:: No  Covid Evaluation: Asymptomatic - no recent exposure (last 10 days) testing not required  Diagnosis: Chest pain [960454]  Admitting Physician: Jonah Blue [[id number]]  Attending Physician: Jonah Blue [[id number]]          B Medical/Surgery History Past Medical History:  Diagnosis Date   AAA (abdominal aortic aneurysm)    Coronary artery disease    Diabetes mellitus without complication    Heart attack    High cholesterol    Hypertension    Past Surgical History:  Procedure Laterality Date   ABDOMINAL AORTIC ANEURYSM REPAIR     BACK SURGERY     x2   BIOPSY  06/03/2021   Procedure: BIOPSY;  Surgeon: Lanelle Bal, DO;  Location: [facility];  Service: Endoscopy;;   CARDIAC CATHETERIZATION     stent placed on right side ofo heart   COLONOSCOPY WITH PROPOFOL N/A 06/03/2021   Surgeon: Earnest Bailey K, DO; Nonbleeding  internal hemorrhoids, 6 mm polyp in sigmoid colon, 2 mm polyp in the ascending colon, congested and erythematous mucosa in the cecum biopsied.  Pathology revealed 2 tubular adenomas, cecal biopsy with polypoid colonic mucosa with ectatic vascular channels with small vascular malformation not ruled out. Repeat in 5 years.   ESOPHAGOGASTRODUODENOSCOPY (EGD) WITH PROPOFOL N/A 06/03/2021   Surgeon: Lanelle Bal, DO; Gastritis biopsied, nonobstructing, nonbleeding duodenal ulcer with no stigmata of bleeding, NSAID induced etiology.  Pathology with chronic gastritis, negative for H. pylori.   KNEE SURGERY Left    PARTIAL COLECTOMY     POLYPECTOMY  06/03/2021   Procedure: POLYPECTOMY INTESTINAL;  Surgeon: Lanelle Bal, DO;  Location: [facility];  Service: Endoscopy;;     A IV Location/Drains/Wounds Patient Lines/Drains/Airways Status     Active Line/Drains/Airways     Name Placement date Placement time Site Days   Peripheral IV 02/06/23 20 G Anterior;Distal;Left Forearm 02/06/23  0446  Forearm  less than 1   Peripheral IV 02/06/23 18 G Anterior;Left;Proximal Forearm 02/06/23  0458  Forearm  less than 1   Peripheral IV 02/06/23 18 G Anterior;Right Forearm 02/06/23  0500  Forearm  less than 1            Intake/Output Last 24 hours  Intake/Output Summary (Last 24 hours) at 02/06/2023 2132 Last data filed at 02/06/2023 1028  Gross per 24 hour  Intake --  Output 400 ml  Net -400 ml    Labs/Imaging Results for orders placed or performed during the hospital encounter of 02/06/23 (from the past 48 hour(s))  Basic metabolic panel     Status: Abnormal   Collection Time: 02/06/23  4:55 AM  Result Value Ref Range   Sodium 132 (L) 135 - 145 mmol/L   Potassium 4.0 3.5 - 5.1 mmol/L   Chloride 101 98 - 111 mmol/L   CO2 17 (L) 22 - 32 mmol/L   Glucose, Bld 201 (H) 70 - 99 mg/dL    Comment: Glucose reference range applies only to samples taken after fasting for at least 8 hours.   BUN  23 8 - 23 mg/dL   Creatinine, Ser 1.61 (H) 0.61 - 1.24 mg/dL   Calcium 9.3 8.9 - 09.6 mg/dL   GFR, Estimated 47 (L) >60 mL/min    Comment: (NOTE) Calculated using the CKD-EPI Creatinine Equation (2021)    Anion gap 14 5 - 15    Comment: Performed at [facility], [address], [place], [place] [address]  CBC     Status: Abnormal   Collection Time: 02/06/23  4:55 AM  Result Value Ref Range   WBC 10.9 (H) 4.0 - 10.5 K/uL   RBC 5.20 4.22 - 5.81 MIL/uL   Hemoglobin 15.2 13.0 - 17.0 g/dL   HCT 98.1 19.1 - 47.8 %   MCV 84.6 80.0 - 100.0 fL   MCH 29.2 26.0 - 34.0 pg   MCHC 34.5 30.0 - 36.0 g/dL   RDW 29.5 62.1 - 30.8 %   Platelets 197 150 - 400 K/uL   nRBC 0.0 0.0 - 0.2 %    Comment: Performed at [facility], [address] [place], [place] [address]  Troponin I (High Sensitivity)     Status: None   Collection Time: 02/06/23  4:55 AM  Result Value Ref Range   Troponin I (High Sensitivity) 7 <18 ng/L    Comment: (NOTE) Elevated high sensitivity troponin I (hsTnI) values and significant  changes across serial measurements may suggest ACS but many other  chronic and acute conditions are known to elevate hsTnI results.  Refer to the "Links" section for chest pain algorithms and additional  guidance. Performed at [facility], [address], [place], [place] [address]   I-stat chem 8, ED (not at [facility], [facility] or [facility])     Status: Abnormal   Collection Time: 02/06/23  5:13 AM  Result Value Ref Range   Sodium 135 135 - 145 mmol/L   Potassium 4.2 3.5 - 5.1 mmol/L   Chloride 104 98 - 111 mmol/L   BUN 27 (H) 8 - 23 mg/dL   Creatinine, Ser 5.28 (H) 0.61 - 1.24 mg/dL   Glucose, Bld 413 (H) 70 - 99 mg/dL    Comment: Glucose reference range applies only to samples taken after fasting for at least 8 hours.   Calcium, Ion 1.16 1.15 - 1.40 mmol/L   TCO2 20 (L) 22 - 32 mmol/L   Hemoglobin 15.3 13.0 - 17.0 g/dL   HCT 24.4 01.0 - 27.2 %  Troponin I (High Sensitivity)      Status: None   Collection Time: 02/06/23  6:30 AM  Result Value Ref Range   Troponin I (High Sensitivity) 7 <18 ng/L    Comment: (NOTE) Elevated high sensitivity troponin I (hsTnI) values and significant  changes across serial measurements  may suggest ACS but many other  chronic and acute conditions are known to elevate hsTnI results.  Refer to the "Links" section for chest pain algorithms and additional  guidance. Performed at [facility], [address], [place], [place] [address]   Lipase, blood     Status: None   Collection Time: 02/06/23  6:30 AM  Result Value Ref Range   Lipase 48 11 - 51 U/L    Comment: Performed at [facility], [address], [place], [place] [address]  CBG monitoring, ED     Status: Abnormal   Collection Time: 02/06/23  6:04 PM  Result Value Ref Range   Glucose-Capillary 190 (H) 70 - 99 mg/dL    Comment: Glucose reference range applies only to samples taken after fasting for at least 8 hours.  Troponin I (High Sensitivity)     Status: None   Collection Time: 02/06/23  7:02 PM  Result Value Ref Range   Troponin I (High Sensitivity) 7 <18 ng/L    Comment: (NOTE) Elevated high sensitivity troponin I (hsTnI) values and significant  changes across serial measurements may suggest ACS but many other  chronic and acute conditions are known to elevate hsTnI results.  Refer to the "Links" section for chest pain algorithms and additional  guidance. Performed at [facility], [address], [place], [place] [address]    DG Chest Port 1 View  Result Date: 02/06/2023 CLINICAL DATA:  62 year old male with history of chest pain. EXAM: PORTABLE CHEST 1 VIEW COMPARISON:  Chest x-ray 12/28/2022. FINDINGS: Lung volumes are normal. No consolidative airspace disease. No pleural effusions. No pneumothorax. No pulmonary nodule or mass noted. Pulmonary vasculature and the cardiomediastinal silhouette are within normal limits. Atherosclerosis in the  thoracic aorta. IMPRESSION: 1.  No radiographic evidence of acute cardiopulmonary disease. 2. Aortic atherosclerosis. Electronically Signed   By: Trudie Reed M.D.   On: 02/06/2023 06:13   CT Angio Chest Aorta W and/or Wo Contrast  Result Date: 02/06/2023 CLINICAL DATA:  62 year old male with history of chest pain that woke him from sleep. Suspected thoracic aortic aneurysm. EXAM: CT ANGIOGRAPHY CHEST WITH CONTRAST TECHNIQUE: Multidetector CT imaging of the chest was performed using the standard protocol during bolus administration of intravenous contrast. Multiplanar CT image reconstructions and MIPs were obtained to evaluate the vascular anatomy. RADIATION DOSE REDUCTION: This exam was performed according to the departmental dose-optimization program which includes automated exposure control, adjustment of the mA and/or kV according to patient size and/or use of iterative reconstruction technique. CONTRAST:  75mL OMNIPAQUE IOHEXOL 350 MG/ML SOLN COMPARISON:  CT of the chest, abdomen and pelvis 05/02/2022. FINDINGS: Cardiovascular: Heart size is normal. There is no significant pericardial fluid, thickening or pericardial calcification. There is aortic atherosclerosis, as well as atherosclerosis of the great vessels of the mediastinum and the coronary arteries, including calcified atherosclerotic plaque in the left main, left anterior descending, left circumflex and right coronary arteries. Thoracic aorta is normal in size measuring 3.4 cm, 3.0 cm and 2.8 cm in diameter in the ascending thoracic aorta, mid arch and descending thoracic aorta respectively. No evidence of thoracic aortic dissection. Bovine type arch (normal anatomical variant) incidentally noted. Mediastinum/Nodes: No pathologically enlarged mediastinal or hilar lymph nodes. Esophagus is unremarkable in appearance. No axillary lymphadenopathy. Lungs/Pleura: Dependent subsegmental atelectasis in the lower lobes of the lungs bilaterally. No acute  consolidative airspace disease. No pleural effusions. No definite suspicious appearing pulmonary nodules or masses are noted. 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 Musculoskeletal: There are no aggressive appearing lytic or blastic lesions noted in the visualized portions of the skeleton. Review of the MIP images confirms the above findings. IMPRESSION: 1. No evidence of thoracic aortic aneurysm or dissection. 2. Aortic atherosclerosis, in addition to left main and three-vessel coronary artery disease. Please note that although the presence of coronary artery calcium documents the presence of coronary artery disease, the severity of this disease and any potential stenosis cannot be assessed on this non-gated CT examination. Assessment for potential risk factor modification, dietary therapy or pharmacologic therapy may be warranted, if clinically indicated. 3. 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 Aortic Atherosclerosis (ICD10-I70.0). Electronically Signed   By: Trudie Reed M.D.   On: 02/06/2023 06:12    Pending Labs Unresulted Labs (From admission, onward)     Start     Ordered   02/07/23 0500  Lipoprotein A (LPA)  Tomorrow morning,   R        02/06/23 0820   02/07/23 0500  Basic metabolic panel  Tomorrow morning,   R        02/06/23 0820   02/07/23 0500  CBC  Tomorrow morning,   R        02/06/23 0820   02/07/23 0500  Heparin level (unfractionated)  Daily,   R     See Hyperspace for full Linked Orders Report.    02/06/23 1546   02/07/23 0500  CBC  Daily,   R     See Hyperspace for full Linked Orders Report.   02/06/23 1546   02/06/23 2127  Heparin level (unfractionated)  ONCE - URGENT,   URGENT        02/06/23 2126   02/06/23 2126  APTT  ONCE - STAT,   STAT        02/06/23 2125            Vitals/Pain Today's Vitals   02/06/23 1619 02/06/23 1648 02/06/23 1800 02/06/23 1859  BP:   (!) 105/56 107/71  Pulse:   (!) 54 63  Resp:    18  Temp: 98.2 F (36.8 C)   98.1 F (36.7 C)  TempSrc: Oral   Oral  SpO2:   96% 98%  Weight:      Height:      PainSc:  3       Isolation Precautions No active isolations  Medications Medications  aspirin EC tablet 81 mg (has no administration in time range)  metoprolol tartrate (LOPRESSOR) tablet 50 mg (50 mg Oral Given 02/06/23 1018)  omega-3 acid ethyl esters (LOVAZA) capsule 2 g (0 g Oral Hold 02/06/23 1200)  rosuvastatin (CRESTOR) tablet 40 mg (40 mg Oral Given 02/06/23 1018)  pantoprazole (PROTONIX) EC tablet 40 mg (40 mg Oral Given 02/06/23 1802)  gabapentin (NEURONTIN) capsule 100 mg (100 mg Oral Given 02/06/23 1018)  sodium chloride flush (NS) 0.9 % injection 3 mL (3 mLs Intravenous Given 02/06/23 1024)  sodium chloride flush (NS) 0.9 % injection 3 mL (has no administration  in time range)  0.9 %  sodium chloride infusion (has no administration in time range)  acetaminophen (TYLENOL) tablet 650 mg (650 mg Oral Given 02/06/23 1018)  ondansetron (ZOFRAN) injection 4 mg (has no administration in time range)  insulin aspart (novoLOG) injection 0-15 Units (3 Units Subcutaneous Given 02/06/23 1807)  nicotine (NICODERM CQ - dosed in mg/24 hours) patch 21 mg (21 mg Transdermal Patch Applied 02/06/23 1021)  insulin aspart (novoLOG) injection 0-5 Units (has no administration in time range)  lisinopril (ZESTRIL) tablet 20 mg (20 mg Oral Given 02/06/23 1018)  fenofibrate tablet 160 mg (160 mg Oral Not Given 02/06/23 1619)  insulin glargine-yfgn (SEMGLEE) injection  30 Units (30 Units Subcutaneous Given 02/06/23 1616)  nitroGLYCERIN 50 mg in dextrose 5 % 250 mL (0.2 mg/mL) infusion (5 mcg/min Intravenous New Bag/Given 02/06/23 1608)  heparin ADULT infusion 100 units/mL (25000 units/262mL) (1,100 Units/hr Intravenous New Bag/Given 02/06/23 1615)  HYDROmorphone (DILAUDID) injection 1 mg (1 mg Intravenous Given 02/06/23 0500)  ondansetron (ZOFRAN) injection 4 mg (4 mg Intravenous Given 02/06/23 0459)  iohexol (OMNIPAQUE) 350 MG/ML injection 75 mL (75 mLs Intravenous Contrast Given 02/06/23 0538)  heparin bolus via infusion 2,000 Units (2,000 Units Intravenous Bolus from Bag 02/06/23 1615)    Mobility walks     Focused Assessments Cardiac Assessment Handoff:  Cardiac Rhythm: Other (Comment) (unknown rhythm, HR 128, R on T PVCs) Lab Results  Component Value Date   CKMBINDEX 1.1 12/29/2022   Lab Results  Component Value Date   DDIMER 4.01 (H) 12/29/2022   Does the Patient currently have chest pain? No    R Recommendations: See Admitting Provider Note  Report given to:   Additional Notes:  Nitro and heparin drip[. [id number]. Can ambulate

## 2023-02-06 NOTE — ED Notes (Addendum)
Pt alert, NAD, calm, interactive, family at [facility] x2, requesting urinal/ given.

## 2023-02-06 NOTE — ED Provider Notes (Signed)
[facility] EMERGENCY DEPARTMENT AT [facility] Provider Note   CSN: [id number] Arrival date & time: 02/06/23  0450     History  Chief Complaint  Patient presents with   Chest Pain    Clayton Peterson is a 62 y.o. male.  The history is provided by the patient and the EMS personnel.  Chest Pain He has history of hypertension, diabetes, hyperlipidemia, coronary artery disease status post stent placement, abdominal aortic aneurysm and comes in with chest pain which woke him up at about 2:30 AM.  Pain is in the upper sternal area with radiation to the back.  There is associated dyspnea, mild nausea, mild diaphoresis.  EMS has given him aspirin, nitroglycerin, morphine without any relief of pain.  He is continuing to complain of severe pain.  He is a cigarette smoker, denies history of premature coronary atherosclerosis in first-degree relatives.   Home Medications Prior to Admission medications   Medication Sig Start Date End Date Taking? Authorizing Provider  acetaminophen (TYLENOL) 325 MG tablet Take 325 mg by mouth every 4 (four) hours as needed for pain. 11/16/19   [provider]  aspirin 81 MG EC tablet Take 81 mg by mouth daily.    [provider]  Cholecalciferol (VITAMIN D3) 125 MCG (5000 UT) TABS Take 1 tablet by mouth daily.    [provider]  empagliflozin (JARDIANCE) 10 MG TABS tablet Take 1 tablet (10 mg total) by mouth daily before breakfast. 01/02/23   Del Newman Nip, Tenna Child, FNP  fenofibrate micronized (LOFIBRA) 200 MG capsule Take 1 capsule (200 mg total) by mouth daily before breakfast. 01/02/23 12/28/23  Del Nigel Berthold, FNP  gabapentin (NEURONTIN) 100 MG capsule Take 100 mg by mouth daily. 05/05/21   [provider]  insulin glargine (LANTUS) 100 UNIT/ML injection Inject 30 Units into the skin daily.    [provider]  lipase/protease/amylase (CREON) 36000 UNITS CPEP capsule Take two with meals and 1 with  snacks. Take with first bite of food. 12/13/22   Tiffany Kocher, PA-C  lisinopril (ZESTRIL) 20 MG tablet Take 20 mg by mouth daily. 03/29/21   [provider]  metFORMIN (GLUCOPHAGE) 1000 MG tablet Take 500 mg by mouth 2 (two) times daily with a meal. 03/18/21   [provider]  metoprolol tartrate (LOPRESSOR) 50 MG tablet Take 1 tablet (50 mg total) by mouth 2 (two) times daily. 12/28/22   Del Nigel Berthold, FNP  nitroGLYCERIN (NITROSTAT) 0.4 MG SL tablet Place 1 tablet (0.4 mg total) under the tongue every 5 (five) minutes as needed for chest pain. 12/28/22   Del Nigel Berthold, FNP  omega-3 acid ethyl esters (LOVAZA) 1 g capsule Take 2 capsules (2 g total) by mouth 2 (two) times daily. 02/17/22 01/24/23  Lewie Chamber, MD  pantoprazole (PROTONIX) 40 MG tablet Take 1 tablet (40 mg total) by mouth 2 (two) times daily before a meal. 12/13/22   Tiffany Kocher, PA-C  RELION PEN NEEDLE 31G/8MM 31G X 8 MM MISC  07/05/22   [provider]  rosuvastatin (CRESTOR) 40 MG tablet Take 40 mg by mouth daily.    [provider]      Allergies    Bee venom and Coconut (cocos nucifera)    Review of Systems   Review of Systems  Cardiovascular:  Positive for chest pain.  All other systems reviewed and are negative.   Physical Exam Updated Vital Signs BP 123/67   Pulse Marland Kitchen)  56   Temp (!) 97.4 F (36.3 C) (Oral)   Resp 19   SpO2 100%  Physical Exam Vitals and nursing note reviewed.   62 year old male, appears very uncomfortable and restless, but in no respiratory distress. Vital signs are normal. Oxygen saturation is 100%, which is normal. Head is normocephalic and atraumatic. PERRLA, EOMI. Oropharynx is clear. Neck is nontender and supple without adenopathy or JVD. Back is nontender and there is no CVA tenderness. Lungs are clear without rales, wheezes, or rhonchi. Chest is nontender. Heart has regular rate and rhythm without murmur. Abdomen is soft, flat,  nontender. Extremities have no cyanosis or edema, full range of motion is present. Skin is warm and dry without rash. Neurologic: Mental status is normal, cranial nerves are intact, moves all extremities equally.  ED Results / Procedures / Treatments   Labs (all labs ordered are listed, but only abnormal results are displayed) Labs Reviewed  BASIC METABOLIC PANEL - Abnormal; Notable for the following components:      Result Value   Sodium 132 (*)    CO2 17 (*)    Glucose, Bld 201 (*)    Creatinine, Ser 1.65 (*)    GFR, Estimated 47 (*)    All other components within normal limits  CBC - Abnormal; Notable for the following components:   WBC 10.9 (*)    All other components within normal limits  I-STAT CHEM 8, ED - Abnormal; Notable for the following components:   BUN 27 (*)    Creatinine, Ser 1.70 (*)    Glucose, Bld 199 (*)    TCO2 20 (*)    All other components within normal limits  TROPONIN I (HIGH SENSITIVITY)  TROPONIN I (HIGH SENSITIVITY)    EKG EKG Interpretation  Date/Time:  Tuesday February 06 2023 04:56:47 EDT Ventricular Rate:  60 PR Interval:  165 QRS Duration: 105 QT Interval:  404 QTC Calculation: 404 R Axis:   67 Text Interpretation: Sinus rhythm Inferior infarct, old When compared with ECG of 02/13/2022, No significant change was found Confirmed by Dione Booze ([id number]) on 02/06/2023 4:59:52 AM  Radiology DG Chest Port 1 View  Result Date: 02/06/2023 CLINICAL DATA:  62 year old male with history of chest pain. EXAM: PORTABLE CHEST 1 VIEW COMPARISON:  Chest x-ray 12/28/2022. FINDINGS: Lung volumes are normal. No consolidative airspace disease. No pleural effusions. No pneumothorax. No pulmonary nodule or mass noted. Pulmonary vasculature and the cardiomediastinal silhouette are within normal limits. Atherosclerosis in the thoracic aorta. IMPRESSION: 1.  No radiographic evidence of acute cardiopulmonary disease. 2. Aortic atherosclerosis. Electronically Signed    By: Trudie Reed M.D.   On: 02/06/2023 06:13   CT Angio Chest Aorta W and/or Wo Contrast  Result Date: 02/06/2023 CLINICAL DATA:  62 year old male with history of chest pain that woke him from sleep. Suspected thoracic aortic aneurysm. EXAM: CT ANGIOGRAPHY CHEST WITH CONTRAST TECHNIQUE: Multidetector CT imaging of the chest was performed using the standard protocol during bolus administration of intravenous contrast. Multiplanar CT image reconstructions and MIPs were obtained to evaluate the vascular anatomy. RADIATION DOSE REDUCTION: This exam was performed according to the departmental dose-optimization program which includes automated exposure control, adjustment of the mA and/or kV according to patient size and/or use of iterative reconstruction technique. CONTRAST:  75mL OMNIPAQUE IOHEXOL 350 MG/ML SOLN COMPARISON:  CT of the chest, abdomen and pelvis 05/02/2022. FINDINGS: Cardiovascular: Heart size is normal. There is no significant pericardial fluid, thickening or pericardial calcification. There is aortic  atherosclerosis, as well as atherosclerosis of the great vessels of the mediastinum and the coronary arteries, including calcified atherosclerotic plaque in the left main, left anterior descending, left circumflex and right coronary arteries. Thoracic aorta is normal in size measuring 3.4 cm, 3.0 cm and 2.8 cm in diameter in the ascending thoracic aorta, mid arch and descending thoracic aorta respectively. No evidence of thoracic aortic dissection. Bovine type arch (normal anatomical variant) incidentally noted. Mediastinum/Nodes: No pathologically enlarged mediastinal or hilar lymph nodes. Esophagus is unremarkable in appearance. No axillary lymphadenopathy. Lungs/Pleura: Dependent subsegmental atelectasis in the lower lobes of the lungs bilaterally. No acute consolidative airspace disease. No pleural effusions. No definite suspicious appearing pulmonary nodules or masses are noted. 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 Musculoskeletal: There are no aggressive appearing lytic or blastic lesions noted in the visualized portions of the skeleton. Review of the MIP images confirms the above findings. IMPRESSION: 1. No evidence of thoracic aortic aneurysm or dissection. 2. Aortic atherosclerosis, in addition to left main and three-vessel coronary artery disease. Please note that although the presence of coronary artery calcium documents the presence of coronary artery disease, the severity of this disease and any potential stenosis cannot be assessed on this non-gated CT examination. Assessment for potential risk factor modification, dietary therapy or pharmacologic therapy may be warranted, if clinically indicated. 3. 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 Aortic Atherosclerosis (ICD10-I70.0). Electronically Signed   By: Trudie Reed M.D.   On: 02/06/2023 06:12    Procedures Procedures  Cardiac monitor shows normal sinus rhythm, per my interpretation.  Medications Ordered in ED Medications  HYDROmorphone (DILAUDID) injection 1 mg (1 mg Intravenous Given 02/06/23 0500)  ondansetron (ZOFRAN) injection 4 mg (4 mg Intravenous Given 02/06/23 0459)    ED Course/ Medical Decision Making/ A&P                             Medical Decision Making Amount and/or Complexity of Data Reviewed Labs: ordered. Radiology: ordered.  Risk Prescription drug management. Decision regarding hospitalization.   Patient with chest  pain concerning for ACS versus aortic dissection.  Doubt pulmonary embolism, pneumonia, GERD.  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  I have ordered hydromorphone for pain and ondansetron for nausea.  I have ordered screening labs of CBC, basic metabolic panel, troponin x 2.  I have ordered portable chest x-ray but also CT angiogram of the chest to look for aortic dissection.  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  Cardiology note on 05/29/2022 did not describes heart attack in 2012 with RCA stent placement, open repair of abdominal aortic aneurysm  with date uncertain.  I have reviewed and interpreted his laboratory tests, and my interpretation is mild hyponatremia which is not felt to be clinically significant, stable renal insufficiency, metabolic acidosis with normal anion gap, mild leukocytosis which is nonspecific, normal troponin.  Chest x-ray shows no acute cardiopulmonary disease.  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  I have independently viewed all of these images, and agree with the radiologist's interpretation.  Following hydromorphone, patient was resting comfortably.  I am concerned about the severity of his pain and the failure to respond to which should have been adequate doses of morphine.  Even if repeat troponin is normal, I do not feel he is safe for  discharge.  I have discussed the case with Dr. Julian Reil of [facility], who agrees to admit the patient.  CRITICAL CARE Performed by: Dione Booze Total critical care time: 50 minutes Critical care time was exclusive of separately billable procedures and treating other patients. Critical care was necessary to treat or prevent imminent or life-threatening deterioration. Critical care was time spent personally by me on the following activities: development of treatment plan with patient and/or surrogate as well as nursing, discussions with consultants, evaluation of patient's response to treatment, examination of patient, obtaining history from patient or surrogate, ordering and performing treatments and interventions, ordering and review of laboratory studies, ordering and review of radiographic studies, pulse oximetry and re-evaluation of patient's condition.  Final Clinical Impression(s) / ED Diagnoses Final diagnoses:  Nonspecific chest pain  Renal insufficiency  Metabolic acidosis    Rx / DC Orders ED Discharge Orders     None         Dione Booze, MD 02/06/23 [phone]

## 2023-02-06 NOTE — Progress Notes (Signed)
ANTICOAGULATION CONSULT NOTE - Initial Consult  Pharmacy Consult for heparin Indication: chest pain/ACS  Allergies  Allergen Reactions   Bee Venom Anaphylaxis   Coconut (Cocos Nucifera) Anaphylaxis    Patient Measurements:   Heparin Dosing Weight: 90.2 kg  Vital Signs: Temp: 98.1 F (36.7 C) (04/16 1205) Temp Source: Oral (04/16 1205) BP: 136/72 (04/16 1300) Pulse Rate: 33 (04/16 1300)  Labs: Recent Labs    02/06/23 0455 02/06/23 0513 02/06/23 0630  HGB 15.2 15.3  --   HCT 44.0 45.0  --   PLT 197  --   --   CREATININE 1.65* 1.70*  --   TROPONINIHS 7  --  7    Estimated Creatinine Clearance: 51.7 mL/min (A) (by C-G formula based on SCr of 1.7 mg/dL (H)).   Medical History: Past Medical History:  Diagnosis Date   AAA (abdominal aortic aneurysm)    Coronary artery disease    Diabetes mellitus without complication    Heart attack    High cholesterol    Hypertension     Medications:  (Not in a hospital admission)  Scheduled:   [START ON 02/07/2023] aspirin EC  81 mg Oral Daily   enoxaparin (LOVENOX) injection  40 mg Subcutaneous Daily   fenofibrate  160 mg Oral Daily   gabapentin  100 mg Oral Daily   insulin aspart  0-15 Units Subcutaneous TID WC   insulin aspart  0-5 Units Subcutaneous QHS   insulin glargine-yfgn  30 Units Subcutaneous Daily   lisinopril  20 mg Oral Daily   metoprolol tartrate  50 mg Oral BID   nicotine  21 mg Transdermal Daily   omega-3 acid ethyl esters  2 g Oral BID   pantoprazole  40 mg Oral BID AC   rosuvastatin  40 mg Oral Daily   sodium chloride flush  3 mL Intravenous Q12H    Assessment: 62 YO male presenting to ED 4/16 with 10/10 chest pain that resolved with administration of Dilaudid (not responsive to morphine). Patient has a history of MI and CAD with last cath being 2012. CTA chest found aortic atherosclerosis as well as left main and three-vessel CAD. Cardiology recommending [facility] inpatient if patient continues to have mild  chest discomfort. Pharmacy consulted to dose heparin.   No anticoagulation PTA, however, patient did receive a dose of prophylactic Lovenox  [place] today (4/16) at 1020. Given patient's low bleed risk and stable CBC (hgb 15.3, plts 197), will cautiously bolus the patient with heparin (~1/2 the normal bolus dose). Will check aPTT with first heparin level as Lovenox may slightly elevate the heparin level.    Goal of Therapy:  Heparin level 0.3-0.7 units/ml Monitor platelets by anticoagulation protocol: Yes   Plan:  Give heparin 2000 units IV x1  Start heparin 1100 units/hr  Check aPTT and heparin level in 6hrs  Monitor heparin level, CBC, and s/sx of bleeding daily    Cherylin Mylar, PharmD PGY1 Pharmacy Resident 4/16/20243:38 PM

## 2023-02-06 NOTE — Consult Note (Addendum)
Cardiology Consultation   Patient ID: Clayton Peterson MRN: [id number]; DOB: [birth year]  Admit date: 02/06/2023 Date of Consult: 02/06/2023  PCP:  Rica Records, FNP   [facility] Providers Cardiologist:  None        Patient Profile:   Clayton Peterson is a 62 y.o. male with a hx of CAD (s/p PCI 2012), AAA s/p open repair, hypertension, hyperlipidemia, DM type II, pancreatitis who is being seen 02/06/2023 for the evaluation of chest pain at the request of Dr. Ophelia Charter.  History of Present Illness:   Mr. Meriwether presented to the emergency department early this morning after he woke up with chest pain around 230 this morning.  Pain originated in the left chest area but per patient did radiate to his back as well.  He describes pain as a burning sensation, as severe as 9 out of 10 and not relieved or made worse by anything that patient noticed.  Around the same time his chest pain began, he also reports feeling short of breath with nausea and some diaphoresis.  Patient called EMS who administered aspirin, nitroglycerin, morphine and route to the hospital, but without notable relief of pain.  In the ED, patient received Dilaudid with improvement in chest discomfort.  CTA chest found aortic atherosclerosis as well as left main and three-vessel CAD.  Thoracic aorta without evidence of dissection.  Initial lab work noted creatinine of 1.65, troponin 7->7.  Mild leukocytosis at 10.9  On my exam, patient reports that other than sudden onset of pain this morning, he has felt normal/at baseline in recent days and weeks.  Denies chest pain, shortness of breath, changes to exertional tolerance.  Patient is known to our cardiology group having been referred to Dr. Rennis Golden for lipid clinic in May of last year.  Patient was last seen in August 2023, noted to have improvement in lipids.  Plan at that time was for 1 year follow-up.  Of note, Dr. Rennis Golden had ordered a CTA chest abdomen pelvis to  follow-up on patient's AAA.  This CT noted aneurysm around the previously repaired abdominal aorta and patient was referred to vascular surgery for follow-up.  Patient saw Dr. Arbie Cookey in September 2023, recommended starting CT imaging, annual follow-up.  Past Medical History:  Diagnosis Date   AAA (abdominal aortic aneurysm)    Coronary artery disease    Diabetes mellitus without complication    Heart attack    High cholesterol    Hypertension     Past Surgical History:  Procedure Laterality Date   ABDOMINAL AORTIC ANEURYSM REPAIR     BACK SURGERY     x2   BIOPSY  06/03/2021   Procedure: BIOPSY;  Surgeon: Lanelle Bal, DO;  Location: [facility];  Service: Endoscopy;;   CARDIAC CATHETERIZATION     stent placed on right side ofo heart   COLONOSCOPY WITH PROPOFOL N/A 06/03/2021   Surgeon: Lanelle Bal, DO; Nonbleeding internal hemorrhoids, 6 mm polyp in sigmoid colon, 2 mm polyp in the ascending colon, congested and erythematous mucosa in the cecum biopsied.  Pathology revealed 2 tubular adenomas, cecal biopsy with polypoid colonic mucosa with ectatic vascular channels with small vascular malformation not ruled out. Repeat in 5 years.   ESOPHAGOGASTRODUODENOSCOPY (EGD) WITH PROPOFOL N/A 06/03/2021   Surgeon: Lanelle Bal, DO; Gastritis biopsied, nonobstructing, nonbleeding duodenal ulcer with no stigmata of bleeding, NSAID induced etiology.  Pathology with chronic gastritis, negative for H. pylori.   KNEE SURGERY Left  PARTIAL COLECTOMY     POLYPECTOMY  06/03/2021   Procedure: POLYPECTOMY INTESTINAL;  Surgeon: Lanelle Bal, DO;  Location: [facility];  Service: Endoscopy;;     Home Medications:  Prior to Admission medications   Medication Sig Start Date End Date Taking? Authorizing Provider  acetaminophen (TYLENOL) 325 MG tablet Take 325 mg by mouth every 4 (four) hours as needed for pain. 11/16/19  Yes [provider]  aspirin 81 MG EC tablet Take 81  mg by mouth daily.   Yes [provider]  Cholecalciferol (VITAMIN D3) 125 MCG (5000 UT) TABS Take 1 tablet by mouth daily.   Yes [provider]  empagliflozin (JARDIANCE) 10 MG TABS tablet Take 1 tablet (10 mg total) by mouth daily before breakfast. 01/02/23  Yes Del Newman Nip, [place], FNP  fenofibrate micronized (LOFIBRA) 200 MG capsule Take 1 capsule (200 mg total) by mouth daily before breakfast. 01/02/23 12/28/23 Yes Del Newman Nip, Tenna Child, FNP  gabapentin (NEURONTIN) 100 MG capsule Take 100 mg by mouth daily. 05/05/21  Yes [provider]  insulin glargine (LANTUS) 100 UNIT/ML injection Inject 30 Units into the skin daily.   Yes [provider]  lisinopril (ZESTRIL) 20 MG tablet Take 20 mg by mouth daily. 03/29/21  Yes [provider]  metFORMIN (GLUCOPHAGE) 1000 MG tablet Take 500 mg by mouth 2 (two) times daily with a meal. 03/18/21  Yes [provider]  metoprolol tartrate (LOPRESSOR) 50 MG tablet Take 1 tablet (50 mg total) by mouth 2 (two) times daily. 12/28/22  Yes Del Newman Nip, Tenna Child, FNP  nitroGLYCERIN (NITROSTAT) 0.4 MG SL tablet Place 1 tablet (0.4 mg total) under the tongue every 5 (five) minutes as needed for chest pain. 12/28/22  Yes Del Newman Nip, Tenna Child, FNP  omega-3 acid ethyl esters (LOVAZA) 1 g capsule Take 2 capsules (2 g total) by mouth 2 (two) times daily. 02/17/22 02/06/24 Yes Lewie Chamber, MD  pantoprazole (PROTONIX) 40 MG tablet Take 1 tablet (40 mg total) by mouth 2 (two) times daily before a meal. 12/13/22  Yes Tiffany Kocher, PA-C  rosuvastatin (CRESTOR) 40 MG tablet Take 40 mg by mouth daily.   Yes [provider]  RELION PEN NEEDLE 31G/8MM 31G X 8 MM MISC  07/05/22   [provider]    Inpatient Medications: Scheduled Meds:  [START ON 02/07/2023] aspirin EC  81 mg Oral Daily   enoxaparin (LOVENOX) injection  40 mg Subcutaneous Daily   fenofibrate micronized  200 mg Oral QAC breakfast    gabapentin  100 mg Oral Daily   insulin aspart  0-15 Units Subcutaneous TID WC   insulin aspart  0-5 Units Subcutaneous QHS   insulin glargine-yfgn  30 Units Subcutaneous Daily   lisinopril  20 mg Oral Daily   metoprolol tartrate  50 mg Oral BID   nicotine  21 mg Transdermal Daily   omega-3 acid ethyl esters  2 g Oral BID   pantoprazole  40 mg Oral BID AC   rosuvastatin  40 mg Oral Daily   sodium chloride flush  3 mL Intravenous Q12H   Continuous Infusions:  sodium chloride     PRN Meds: sodium chloride, acetaminophen, nitroGLYCERIN, ondansetron (ZOFRAN) IV, sodium chloride flush  Allergies:    Allergies  Allergen Reactions   Bee Venom Anaphylaxis   Coconut (Cocos Nucifera) Anaphylaxis    Social History:   Social History   Socioeconomic History   Marital status: Married    Spouse  name: Not on file   Number of children: Not on file   Years of education: Not on file   Highest education level: Not on file  Occupational History   Occupation: disabled, former truck driver  Tobacco Use   Smoking status: Every Day    Packs/day: 1.50    Years: 40.00    Additional pack years: 0.00    Total pack years: 60.00    Types: Cigarettes    Passive exposure: Current   Smokeless tobacco: Never  Vaping Use   Vaping Use: Never used  Substance and Sexual Activity   Alcohol use: Yes    Comment: very seldom   Drug use: Never   Sexual activity: Yes  Other Topics Concern   Not on file  Social History Narrative   Not on file   Social Determinants of Health   Financial Resource Strain: Not on file  Food Insecurity: Not on file  Transportation Needs: Not on file  Physical Activity: Not on file  Stress: Not on file  Social Connections: Not on file  Intimate Partner Violence: Not on file    Family History:    Family History  Problem Relation Age of Onset   Atrial fibrillation Mother    Alzheimer's disease Mother    Diabetes Brother    Alzheimer's disease Maternal Grandmother     Aneurysm Maternal Grandfather    Pancreatitis Neg Hx    Pancreatic cancer Neg Hx    Colon cancer Neg Hx      ROS:  Please see the history of present illness.   All other ROS reviewed and negative.     Physical Exam/Data:   Vitals:   02/06/23 1015 02/06/23 1025 02/06/23 1030 02/06/23 1205  BP: 127/73 126/72 129/68   Pulse: 65 63 71   Resp: Temp:    98.1 F (36.7 C)  TempSrc:    Oral  SpO2: 98% 97% 98%     Intake/Output Summary (Last 24 hours) at 02/06/2023 1213 Last data filed at 02/06/2023 1028 Gross per 24 hour  Intake --  Output 400 ml  Net -400 ml      01/24/2023    1:26 PM 01/15/2023    9:21 AM 12/28/2022    1:28 PM  Last 3 Weights  Weight (lbs) 208 lb 210 lb 216 lb  Weight (kg) 94.348 kg 95.255 kg 97.977 kg     There is no height or weight on file to calculate BMI.  General:  Well nourished, well developed, in no acute distress HEENT: normal Neck: no JVD Vascular: No carotid bruits; Distal pulses 2+ bilaterally Cardiac:  normal S1, S2; RRR; no murmur  Lungs: Respiratory exam consistent with COPD.  Diffuse but mild end inspiratory wheezing/rhonchi. Abd: soft, nontender, no hepatomegaly  Ext: no edema Musculoskeletal:  No deformities, BUE and BLE strength normal and equal Skin: warm and dry  Neuro:  CNs 2-12 intact, no focal abnormalities noted Psych:  Normal affect   EKG:  The EKG was personally reviewed and demonstrates: Sinus rhythm.  Nonspecific/abnormal appearing ST segments in inferior leads, not new and consistent with prior tracings. Telemetry: Patient not connected at time of exam.  Relevant CV Studies:  N/A  Laboratory Data:  High Sensitivity Troponin:   Recent Labs  Lab 02/06/23 0455 02/06/23 0630  TROPONINIHS 7 7     Chemistry Recent Labs  Lab 02/06/23 0455 02/06/23 0513  NA 132* 135  K 4.0 4.2  CL 101 104  CO2 17*  --   GLUCOSE 201* 199*  BUN 23 27*  CREATININE 1.65* 1.70*  CALCIUM 9.3  --   GFRNONAA 47*  --    ANIONGAP 14  --     No results for input(s): "PROT", "ALBUMIN", "AST", "ALT", "ALKPHOS", "BILITOT" in the last 168 hours. Lipids No results for input(s): "CHOL", "TRIG", "HDL", "LABVLDL", "LDLCALC", "CHOLHDL" in the last 168 hours.  Hematology Recent Labs  Lab 02/06/23 0455 02/06/23 0513  WBC 10.9*  --   RBC 5.20  --   HGB 15.2 15.3  HCT 44.0 45.0  MCV 84.6  --   MCH 29.2  --   MCHC 34.5  --   RDW 14.7  --   PLT 197  --    Thyroid No results for input(s): "TSH", "FREET4" in the last 168 hours.  BNPNo results for input(s): "BNP", "PROBNP" in the last 168 hours.  DDimer No results for input(s): "DDIMER" in the last 168 hours.   Radiology/Studies:  DG Chest Port 1 View  Result Date: 02/06/2023 CLINICAL DATA:  62 year old male with history of chest pain. EXAM: PORTABLE CHEST 1 VIEW COMPARISON:  Chest x-ray 12/28/2022. FINDINGS: Lung volumes are normal. No consolidative airspace disease. No pleural effusions. No pneumothorax. No pulmonary nodule or mass noted. Pulmonary vasculature and the cardiomediastinal silhouette are within normal limits. Atherosclerosis in the thoracic aorta. IMPRESSION: 1.  No radiographic evidence of acute cardiopulmonary disease. 2. Aortic atherosclerosis. Electronically Signed   By: Trudie Reed M.D.   On: 02/06/2023 06:13   CT Angio Chest Aorta W and/or Wo Contrast  Result Date: 02/06/2023 CLINICAL DATA:  62 year old male with history of chest pain that woke him from sleep. Suspected thoracic aortic aneurysm. EXAM: CT ANGIOGRAPHY CHEST WITH CONTRAST TECHNIQUE: Multidetector CT imaging of the chest was performed using the standard protocol during bolus administration of intravenous contrast. Multiplanar CT image reconstructions and MIPs were obtained to evaluate the vascular anatomy. RADIATION DOSE REDUCTION: This exam was performed according to the departmental dose-optimization program which includes automated exposure control, adjustment of the mA and/or  kV according to patient size and/or use of iterative reconstruction technique. CONTRAST:  75mL OMNIPAQUE IOHEXOL 350 MG/ML SOLN COMPARISON:  CT of the chest, abdomen and pelvis 05/02/2022. FINDINGS: Cardiovascular: Heart size is normal. There is no significant pericardial fluid, thickening or pericardial calcification. There is aortic atherosclerosis, as well as atherosclerosis of the great vessels of the mediastinum and the coronary arteries, including calcified atherosclerotic plaque in the left main, left anterior descending, left circumflex and right coronary arteries. Thoracic aorta is normal in size measuring 3.4 cm, 3.0 cm and 2.8 cm in diameter in the ascending thoracic aorta, mid arch and descending thoracic aorta respectively. No evidence of thoracic aortic dissection. Bovine type arch (normal anatomical variant) incidentally noted. Mediastinum/Nodes: No pathologically enlarged mediastinal or hilar lymph nodes. Esophagus is unremarkable in appearance. No axillary lymphadenopathy. Lungs/Pleura: Dependent subsegmental atelectasis in the lower lobes of the lungs bilaterally. No acute consolidative airspace disease. No pleural effusions. No definite suspicious appearing pulmonary nodules or masses are noted. Upper Abdomen: Complex cystic lesion associated with the distal body and tail of the pancreas extending cephalad exerting significant mass effect upon the undersurface of the body of the stomach (axial image 123 of series 5), currently measuring up to 11.2 x 7.9 cm, increased in size compared to the prior study, most compatible with a large pancreatic pseudocyst. Musculoskeletal: There are no aggressive appearing lytic or blastic lesions noted in the  visualized portions of the skeleton. Review of the MIP images confirms the above findings. IMPRESSION: 1. No evidence of thoracic aortic aneurysm or dissection. 2. Aortic atherosclerosis, in addition to left main and three-vessel coronary artery disease.  Please note that although the presence of coronary artery calcium documents the presence of coronary artery disease, the severity of this disease and any potential stenosis cannot be assessed on this non-gated CT examination. Assessment for potential risk factor modification, dietary therapy or pharmacologic therapy may be warranted, if clinically indicated. 3. 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 Aortic Atherosclerosis (ICD10-I70.0). Electronically Signed   By: Trudie Reed M.D.   On: 02/06/2023 06:12     Assessment and Plan:   Chest pain History of CAD  Patient with hx PCI to RCA in 2012, presenting with sudden onset burning chest pain. Aside from symptoms prompting call to EMS/ED visit today, no recent angina, palpitations, shortness of breath.  Patient's symptoms are somewhat atypical but given CAD history as well as notable multi-vessel calcifications on chest CT, would consider cardiac catheterization for further evaluation. Stress myoview would likely be non-diagnostic due to potential for decreased but balanced perfusion 2/2 likely multi-vessel calcification. If patient continues to have some mild discomfort, would favor inpatient LHC, though CKD would need to be taken into consideration. Will discuss with Dr. Elease Hashimoto. Continue to monitor symptoms. Will repeat third troponin this evening.  Continue ASA  AAA status post repair  Patient last seen by vascular surgery in September 2023 after CTA showed dilation proximal and distal to repair.   Lipid management and BP control remain important Patient STRONGLY encouraged to fully cease tobacco use.  Hypertension  BP stable. Would continue home metoprolol and lisinopril.   Hyperlipidemia  Patient followed by Dr. Hilty/lipid clinic. Continue Lofibra, Crestor, Lovaza. Will  follow lipoprotein A.   Per primary team:  CKD IIIa Pancreatic pseudocyst DM type II OSA  Lab Results  Component Value Date   CHOL 144 12/29/2022   HDL 35 (L) 12/29/2022   LDLCALC 73 12/29/2022   LDLDIRECT 64 05/25/2022   TRIG 218 (H) 12/29/2022   CHOLHDL 4.1 12/29/2022    Risk Assessment/Risk Scores:     TIMI Risk Score for Unstable Angina or Non-ST Elevation MI:   The patient's TIMI risk score is 3, which indicates a 13% risk of all cause mortality, new or recurrent myocardial infarction or need for urgent revascularization in the next 14 days.          For questions or updates, please contact [facility] Please consult [email or web address] for contact info under    Signed, Perlie Gold, PA-C  02/06/2023 12:13 PM   Attending Note:   The patient was seen and examined.  Agree with assessment and plan as noted above.  Changes made to the above note as needed.  Patient seen and independently examined with Perlie Gold, PA .   We discussed all aspects of the encounter. I agree with the assessment and plan as stated above.    Chest pain :   has severe hyperlipidemia.  Has seen Dr Rennis Golden. Continues to smoke.  Has evidence of 3 V calcification .  Troponins are negative which is reassuring. Myoview study would be potentially difficult to interpret.  I think our best option is to proceed with cath .  I have discussed risks, benefits,  options of cath  He understands and agrees to proceed.    I have spent a total of 40 minutes with patient reviewing hospital  notes , telemetry, EKGs, labs and examining patient as well as establishing an assessment and plan that was discussed with the patient.  > 50% of time was spent in direct patient care.  I've advised him to stop smoking     Vesta Mixer, Montez Hageman., MD, [facility] 02/06/2023, 12:41 PM [address]  [address] Office [phone] Pager [phone]

## 2023-02-06 NOTE — H&P (Signed)
History and Physical    Patient: Clayton Peterson ZOX:[id number] DOB: 02-20-1961 DOA: 02/06/2023 DOS: the patient was seen and examined on 02/06/2023 PCP: Del Nigel Berthold, FNP  Patient coming from: Home - lives with wife, and child; NOK: Wife, Abanoub Hanken, [phone]    Chief Complaint: Chest pain  HPI: Clayton Peterson is a 62 y.o. male with medical history significant of CAD s/p stent, AAA s/p repair, DM, HTN, and HLD presenting with chest pain.  He reports prior h/o MI but last cath was in 2012.  Last night, he awoke from sleep about 0200 with severe chest pain.  Nothing made it worse.  It improved with Dilaudid in the ER.  He is still having some chest discomfort this AM, not reproducible.      ER Course:  Carryover, per Dr. Julian Reil:  Pt with CP, initial work up neg.  H/o AAA repair, prior MI with stent.  Trop neg.  ECG unchanged.  CTA neg for dissection and no PE.      Review of Systems: As mentioned in the history of present illness. All other systems reviewed and are negative. Past Medical History:  Diagnosis Date   AAA (abdominal aortic aneurysm)    Coronary artery disease    Diabetes mellitus without complication    Heart attack    High cholesterol    Hypertension    Past Surgical History:  Procedure Laterality Date   ABDOMINAL AORTIC ANEURYSM REPAIR     BACK SURGERY     x2   BIOPSY  06/03/2021   Procedure: BIOPSY;  Surgeon: Lanelle Bal, DO;  Location: [facility];  Service: Endoscopy;;   CARDIAC CATHETERIZATION     stent placed on right side ofo heart   COLONOSCOPY WITH PROPOFOL N/A 06/03/2021   Surgeon: Lanelle Bal, DO; Nonbleeding internal hemorrhoids, 6 mm polyp in sigmoid colon, 2 mm polyp in the ascending colon, congested and erythematous mucosa in the cecum biopsied.  Pathology revealed 2 tubular adenomas, cecal biopsy with polypoid colonic mucosa with ectatic vascular channels with small vascular malformation not ruled out. Repeat in 5  years.   ESOPHAGOGASTRODUODENOSCOPY (EGD) WITH PROPOFOL N/A 06/03/2021   Surgeon: Lanelle Bal, DO; Gastritis biopsied, nonobstructing, nonbleeding duodenal ulcer with no stigmata of bleeding, NSAID induced etiology.  Pathology with chronic gastritis, negative for H. pylori.   KNEE SURGERY Left    PARTIAL COLECTOMY     POLYPECTOMY  06/03/2021   Procedure: POLYPECTOMY INTESTINAL;  Surgeon: Lanelle Bal, DO;  Location: [facility];  Service: Endoscopy;;   Social History:  reports that he has been smoking cigarettes. He has a 60.00 pack-year smoking history. He has been exposed to tobacco smoke. He has never used smokeless tobacco. He reports current alcohol use. He reports that he does not use drugs.  Allergies  Allergen Reactions   Bee Venom Anaphylaxis   Coconut (Cocos Nucifera) Anaphylaxis    Family History  Problem Relation Age of Onset   Atrial fibrillation Mother    Alzheimer's disease Mother    Diabetes Brother    Alzheimer's disease Maternal Grandmother    Aneurysm Maternal Grandfather    Pancreatitis Neg Hx    Pancreatic cancer Neg Hx    Colon cancer Neg Hx     Prior to Admission medications   Medication Sig Start Date End Date Taking? Authorizing Provider  acetaminophen (TYLENOL) 325 MG tablet Take 325 mg by mouth every 4 (four) hours as needed for pain. 11/16/19  Yes [provider]  aspirin 81 MG EC tablet Take 81 mg by mouth daily.   Yes [provider]  Cholecalciferol (VITAMIN D3) 125 MCG (5000 UT) TABS Take 1 tablet by mouth daily.   Yes [provider]  empagliflozin (JARDIANCE) 10 MG TABS tablet Take 1 tablet (10 mg total) by mouth daily before breakfast. 01/02/23  Yes Del Newman Nip, [place], FNP  fenofibrate micronized (LOFIBRA) 200 MG capsule Take 1 capsule (200 mg total) by mouth daily before breakfast. 01/02/23 12/28/23 Yes Del Newman Nip, Tenna Child, FNP  gabapentin (NEURONTIN) 100 MG capsule Take 100 mg by mouth daily. 05/05/21   Yes [provider]  insulin glargine (LANTUS) 100 UNIT/ML injection Inject 30 Units into the skin daily.   Yes [provider]  lisinopril (ZESTRIL) 20 MG tablet Take 20 mg by mouth daily. 03/29/21  Yes [provider]  metFORMIN (GLUCOPHAGE) 1000 MG tablet Take 500 mg by mouth 2 (two) times daily with a meal. 03/18/21  Yes [provider]  metoprolol tartrate (LOPRESSOR) 50 MG tablet Take 1 tablet (50 mg total) by mouth 2 (two) times daily. 12/28/22  Yes Del Newman Nip, Tenna Child, FNP  nitroGLYCERIN (NITROSTAT) 0.4 MG SL tablet Place 1 tablet (0.4 mg total) under the tongue every 5 (five) minutes as needed for chest pain. 12/28/22  Yes Del Newman Nip, Tenna Child, FNP  omega-3 acid ethyl esters (LOVAZA) 1 g capsule Take 2 capsules (2 g total) by mouth 2 (two) times daily. 02/17/22 02/06/24 Yes Lewie Chamber, MD  pantoprazole (PROTONIX) 40 MG tablet Take 1 tablet (40 mg total) by mouth 2 (two) times daily before a meal. 12/13/22  Yes Tiffany Kocher, PA-C  rosuvastatin (CRESTOR) 40 MG tablet Take 40 mg by mouth daily.   Yes [provider]  lipase/protease/amylase (CREON) 36000 UNITS CPEP capsule Take two with meals and 1 with snacks. Take with first bite of food. Patient not taking: Reported on 02/06/2023 12/13/22   Tiffany Kocher, PA-C  RELION PEN NEEDLE 31G/8MM 31G X 8 MM MISC  07/05/22   [provider]    Physical Exam: Vitals:   02/06/23 0630 02/06/23 0645 02/06/23 0700 02/06/23 0800  BP: 139/64 122/64 117/71 130/65  Pulse: 66 65 61 97  Resp: (!) 21  Temp: 97.8 F (36.6 C)     TempSrc: Oral     SpO2: 100% 100% 99% 98%   General:  Appears calm and comfortable and is in NAD, sleeping comfortably Eyes:  PERRL, EOMI, normal lids, iris ENT:  grossly normal hearing, lips & tongue, mmm; poor/absent dentition Neck:  no LAD, masses or thyromegaly Cardiovascular:  RRR, no m/r/g. No LE edema. No reproducible CP. Respiratory:   CTA bilaterally  with no wheezes/rales/rhonchi.  Normal respiratory effort. Abdomen:  soft, NT, ND Skin:  no rash or induration seen on limited exam Musculoskeletal:  grossly normal tone BUE/BLE, good ROM, no bony abnormality Lower extremity:  No LE edema.  Limited foot exam with no ulcerations.  2+ distal pulses. Psychiatric:  grossly normal mood and affect, speech fluent and appropriate, AOx3 Neurologic:  CN 2-12 grossly intact, moves all extremities in coordinated fashion   Radiological Exams on Admission: Independently reviewed - see discussion in A/P where applicable  DG Chest Port 1 View  Result Date: 02/06/2023 CLINICAL DATA:  62 year old male with history of chest pain. EXAM: PORTABLE CHEST 1 VIEW COMPARISON:  Chest x-ray 12/28/2022. FINDINGS: Lung volumes are normal. No consolidative airspace disease.  No pleural effusions. No pneumothorax. No pulmonary nodule or mass noted. Pulmonary vasculature and the cardiomediastinal silhouette are within normal limits. Atherosclerosis in the thoracic aorta. IMPRESSION: 1.  No radiographic evidence of acute cardiopulmonary disease. 2. Aortic atherosclerosis. Electronically Signed   By: Trudie Reed M.D.   On: 02/06/2023 06:13   CT Angio Chest Aorta W and/or Wo Contrast  Result Date: 02/06/2023 CLINICAL DATA:  62 year old male with history of chest pain that woke him from sleep. Suspected thoracic aortic aneurysm. EXAM: CT ANGIOGRAPHY CHEST WITH CONTRAST TECHNIQUE: Multidetector CT imaging of the chest was performed using the standard protocol during bolus administration of intravenous contrast. Multiplanar CT image reconstructions and MIPs were obtained to evaluate the vascular anatomy. RADIATION DOSE REDUCTION: This exam was performed according to the departmental dose-optimization program which includes automated exposure control, adjustment of the mA and/or kV according to patient size and/or use of iterative reconstruction technique. CONTRAST:  75mL OMNIPAQUE  IOHEXOL 350 MG/ML SOLN COMPARISON:  CT of the chest, abdomen and pelvis 05/02/2022. FINDINGS: Cardiovascular: Heart size is normal. There is no significant pericardial fluid, thickening or pericardial calcification. There is aortic atherosclerosis, as well as atherosclerosis of the great vessels of the mediastinum and the coronary arteries, including calcified atherosclerotic plaque in the left main, left anterior descending, left circumflex and right coronary arteries. Thoracic aorta is normal in size measuring 3.4 cm, 3.0 cm and 2.8 cm in diameter in the ascending thoracic aorta, mid arch and descending thoracic aorta respectively. No evidence of thoracic aortic dissection. Bovine type arch (normal anatomical variant) incidentally noted. Mediastinum/Nodes: No pathologically enlarged mediastinal or hilar lymph nodes. Esophagus is unremarkable in appearance. No axillary lymphadenopathy. Lungs/Pleura: Dependent subsegmental atelectasis in the lower lobes of the lungs bilaterally. No acute consolidative airspace disease. No pleural effusions. No definite suspicious appearing pulmonary nodules or masses are noted. Upper Abdomen: Complex cystic lesion associated with the distal body and tail of the pancreas extending cephalad exerting significant mass effect upon the undersurface of the body of the stomach (axial image 123 of series 5), currently measuring up to 11.2 x 7.9 cm, increased in size compared to the prior study, most compatible with a large pancreatic pseudocyst. Musculoskeletal: There are no aggressive appearing lytic or blastic lesions noted in the visualized portions of the skeleton. Review of the MIP images confirms the above findings. IMPRESSION: 1. No evidence of thoracic aortic aneurysm or dissection. 2. Aortic atherosclerosis, in addition to left main and three-vessel coronary artery disease. Please note that although the presence of coronary artery calcium documents the presence of coronary artery  disease, the severity of this disease and any potential stenosis cannot be assessed on this non-gated CT examination. Assessment for potential risk factor modification, dietary therapy or pharmacologic therapy may be warranted, if clinically indicated. 3. 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 Aortic Atherosclerosis (ICD10-I70.0). Electronically Signed   By: Trudie Reed M.D.   On: 02/06/2023 06:12    EKG: Independently reviewed.  NSR with rate 60; nonspecific ST changes with no evidence of acute ischemia   Labs on Admission: I have personally reviewed the available labs and imaging studies at the time of the admission.  Pertinent labs:    Na++ 132 CO2 17 BUN 23/Creatinine 1.65/GFR 47 - stable HS troponin 7, 7 Lipids on 3/8: 144/35/73/218 WBC 10.9 A1c on 3/8 8.7 Normal  TSH/rfee T4 on 3/8   Assessment and Plan: Principal Problem:   Chronic chest pain with high risk for coronary artery disease Active Problems:   Mixed hyperlipidemia   DM type 2 causing vascular disease   Essential hypertension, benign   Coronary artery disease   Chronic kidney disease, stage 3b   Pancreatic pseudocyst   Cigarette nicotine dependence without complication   Class 3 severe obesity due to excess calories without serious comorbidity with body mass index (BMI) of 40.0 to 44.9 in adult   OSA on CPAP   AAA (abdominal aortic aneurysm) without rupture    Chest pain -Patient with substernal chest pain that came on during sleep, still present but improved with Dilaudid -1/3 typical symptoms suggestive of noncardiac chest pain.  -CXR unremarkable.   -Initial cardiac HS troponin negative with negative delta.  -EKG not indicative of acute ischemia.   -CTA with atherosclerosis -Will plan to place in observation status on telemetry to rule out  ACS by overnight observation.  -Continue ASA 81 mg daily -Cardiology consultation requested  HTN -Continue metoprolol, lisinopril -Will also add prn hydralazine  HLD -Continue rosuvastatin, Lofibra, Lovaza -Lipids were checked last month so will not repeat at this time  DM -Last A1c was 8.7, poor control -Hold Jardiance, metformin -Continue glargine -Will cover with moderate-scale SSI for now -Continue gabapentin  AAA -s/p repair in 2009 -There is some dilatation above and below the repair -He last saw Dr. Arbie Cookey in 06/2022 and is recommended to have repeat CT in 1 year for continued follow-up  Stage 3b CKD -Appears to be stable at this time -Attempt to avoid nephrotoxic medications -Recheck BMP in AM   Pancreatic pseudocyst -Noted on imaging -No longer taking Creon -Continue PPI -Recommend outpatient GI f/u  Tobacco dependence -Encourage cessation.   -This was discussed with the patient and should be reviewed on an ongoing basis.   -Patch ordered at patient request.   OSA -Continue CPAP  Obesity -Weight loss should be encouraged -Outpatient PCP/bariatric medicine f/u encouraged      Advance Care Planning:   Code Status: Full Code - Code status was discussed with the patient and/or family at the time of admission.  The patient would want to receive full resuscitative measures at this time.   Consults: Cardiology  DVT Prophylaxis: Lovenox  Family Communication: None present; I spoke with his wife by telephone at the time of admission  Severity of Illness: The appropriate patient status for this patient is OBSERVATION. Observation status is judged to be reasonable and necessary in order to provide the required intensity of service to ensure the patient's safety. The patient's presenting symptoms, physical exam findings, and initial radiographic and laboratory data in the context of their medical condition is felt to place them at decreased risk for further  clinical deterioration. Furthermore, it is anticipated that the patient will be medically stable for discharge from the hospital within 2 midnights of admission.   Author: Jonah Blue, MD 02/06/2023 8:30 AM  For on call review [email or web address].

## 2023-02-07 ENCOUNTER — Encounter (HOSPITAL_COMMUNITY)
Admission: EM | Disposition: A | Discharge status: Home / Self Care | Payer: Self-pay | Source: Home / Self Care | Attending: Internal Medicine

## 2023-02-07 DIAGNOSIS — Z79899 Other long term (current) drug therapy: Secondary | ICD-10-CM | POA: Diagnosis not present

## 2023-02-07 DIAGNOSIS — E871 Hypo-osmolality and hyponatremia: Secondary | ICD-10-CM | POA: Diagnosis present

## 2023-02-07 DIAGNOSIS — I25118 Atherosclerotic heart disease of native coronary artery with other forms of angina pectoris: Secondary | ICD-10-CM

## 2023-02-07 DIAGNOSIS — I10 Essential (primary) hypertension: Secondary | ICD-10-CM | POA: Diagnosis not present

## 2023-02-07 DIAGNOSIS — K2289 Other specified disease of esophagus: Secondary | ICD-10-CM | POA: Diagnosis not present

## 2023-02-07 DIAGNOSIS — E782 Mixed hyperlipidemia: Secondary | ICD-10-CM | POA: Diagnosis present

## 2023-02-07 DIAGNOSIS — I959 Hypotension, unspecified: Secondary | ICD-10-CM | POA: Diagnosis not present

## 2023-02-07 DIAGNOSIS — R079 Chest pain, unspecified: Secondary | ICD-10-CM | POA: Diagnosis not present

## 2023-02-07 DIAGNOSIS — G4733 Obstructive sleep apnea (adult) (pediatric): Secondary | ICD-10-CM | POA: Diagnosis present

## 2023-02-07 DIAGNOSIS — I25110 Atherosclerotic heart disease of native coronary artery with unstable angina pectoris: Secondary | ICD-10-CM | POA: Diagnosis present

## 2023-02-07 DIAGNOSIS — Z9189 Other specified personal risk factors, not elsewhere classified: Secondary | ICD-10-CM | POA: Diagnosis not present

## 2023-02-07 DIAGNOSIS — K219 Gastro-esophageal reflux disease without esophagitis: Secondary | ICD-10-CM | POA: Diagnosis present

## 2023-02-07 DIAGNOSIS — E872 Acidosis, unspecified: Secondary | ICD-10-CM | POA: Diagnosis present

## 2023-02-07 DIAGNOSIS — K760 Fatty (change of) liver, not elsewhere classified: Secondary | ICD-10-CM | POA: Diagnosis present

## 2023-02-07 DIAGNOSIS — I252 Old myocardial infarction: Secondary | ICD-10-CM | POA: Diagnosis not present

## 2023-02-07 DIAGNOSIS — K7460 Unspecified cirrhosis of liver: Secondary | ICD-10-CM | POA: Diagnosis present

## 2023-02-07 DIAGNOSIS — K859 Acute pancreatitis without necrosis or infection, unspecified: Secondary | ICD-10-CM | POA: Diagnosis present

## 2023-02-07 DIAGNOSIS — K3189 Other diseases of stomach and duodenum: Secondary | ICD-10-CM | POA: Diagnosis not present

## 2023-02-07 DIAGNOSIS — I714 Abdominal aortic aneurysm, without rupture, unspecified: Secondary | ICD-10-CM | POA: Diagnosis present

## 2023-02-07 DIAGNOSIS — E1159 Type 2 diabetes mellitus with other circulatory complications: Secondary | ICD-10-CM

## 2023-02-07 DIAGNOSIS — K862 Cyst of pancreas: Secondary | ICD-10-CM | POA: Diagnosis not present

## 2023-02-07 DIAGNOSIS — E1122 Type 2 diabetes mellitus with diabetic chronic kidney disease: Secondary | ICD-10-CM | POA: Diagnosis present

## 2023-02-07 DIAGNOSIS — I82891 Chronic embolism and thrombosis of other specified veins: Secondary | ICD-10-CM | POA: Diagnosis present

## 2023-02-07 DIAGNOSIS — K449 Diaphragmatic hernia without obstruction or gangrene: Secondary | ICD-10-CM | POA: Diagnosis not present

## 2023-02-07 DIAGNOSIS — N1832 Chronic kidney disease, stage 3b: Secondary | ICD-10-CM | POA: Diagnosis present

## 2023-02-07 DIAGNOSIS — Z955 Presence of coronary angioplasty implant and graft: Secondary | ICD-10-CM | POA: Diagnosis not present

## 2023-02-07 DIAGNOSIS — K259 Gastric ulcer, unspecified as acute or chronic, without hemorrhage or perforation: Secondary | ICD-10-CM | POA: Diagnosis present

## 2023-02-07 DIAGNOSIS — Z87898 Personal history of other specified conditions: Secondary | ICD-10-CM | POA: Diagnosis not present

## 2023-02-07 DIAGNOSIS — I2510 Atherosclerotic heart disease of native coronary artery without angina pectoris: Secondary | ICD-10-CM | POA: Diagnosis not present

## 2023-02-07 DIAGNOSIS — I129 Hypertensive chronic kidney disease with stage 1 through stage 4 chronic kidney disease, or unspecified chronic kidney disease: Secondary | ICD-10-CM | POA: Diagnosis present

## 2023-02-07 DIAGNOSIS — I2584 Coronary atherosclerosis due to calcified coronary lesion: Secondary | ICD-10-CM

## 2023-02-07 DIAGNOSIS — K2950 Unspecified chronic gastritis without bleeding: Secondary | ICD-10-CM | POA: Diagnosis not present

## 2023-02-07 DIAGNOSIS — F17210 Nicotine dependence, cigarettes, uncomplicated: Secondary | ICD-10-CM | POA: Diagnosis present

## 2023-02-07 DIAGNOSIS — Z794 Long term (current) use of insulin: Secondary | ICD-10-CM | POA: Diagnosis not present

## 2023-02-07 DIAGNOSIS — K863 Pseudocyst of pancreas: Secondary | ICD-10-CM | POA: Diagnosis not present

## 2023-02-07 DIAGNOSIS — R1013 Epigastric pain: Secondary | ICD-10-CM | POA: Diagnosis not present

## 2023-02-07 DIAGNOSIS — G8929 Other chronic pain: Secondary | ICD-10-CM | POA: Diagnosis not present

## 2023-02-07 HISTORY — PX: LEFT HEART CATH AND CORONARY ANGIOGRAPHY: CATH118249

## 2023-02-07 LAB — HEPARIN LEVEL (UNFRACTIONATED)
Heparin Unfractionated: 0.43 IU/mL (ref 0.30–0.70)
Heparin Unfractionated: 0.25 IU/mL — ABNORMAL LOW (ref 0.30–0.70)

## 2023-02-07 LAB — CBC
HCT: 43 % (ref 39.0–52.0)
Hemoglobin: 14.1 g/dL (ref 13.0–17.0)
MCH: 28.5 pg (ref 26.0–34.0)
MCHC: 32.8 g/dL (ref 30.0–36.0)
MCV: 86.9 fL (ref 80.0–100.0)
Platelets: 155 10*3/uL (ref 150–400)
RBC: 4.95 MIL/uL (ref 4.22–5.81)
RDW: 14.8 % (ref 11.5–15.5)
WBC: 9.8 10*3/uL (ref 4.0–10.5)
nRBC: 0 % (ref 0.0–0.2)

## 2023-02-07 LAB — TROPONIN I (HIGH SENSITIVITY): Troponin I (High Sensitivity): 10 ng/L (ref <18)

## 2023-02-07 LAB — BASIC METABOLIC PANEL
Anion gap: 10 (ref 5–15)
BUN: 21 mg/dL (ref 8–23)
CO2: 22 mmol/L (ref 22–32)
Calcium: 9.2 mg/dL (ref 8.9–10.3)
Chloride: 102 mmol/L (ref 98–111)
Creatinine, Ser: 1.53 mg/dL — ABNORMAL HIGH (ref 0.61–1.24)
GFR, Estimated: 51 mL/min — ABNORMAL LOW (ref >60)
Glucose, Bld: 175 mg/dL — ABNORMAL HIGH (ref 70–99)
Potassium: 4.2 mmol/L (ref 3.5–5.1)
Sodium: 134 mmol/L — ABNORMAL LOW (ref 135–145)

## 2023-02-07 LAB — GLUCOSE, CAPILLARY
Glucose-Capillary: 182 mg/dL — ABNORMAL HIGH (ref 70–99)
Glucose-Capillary: 160 mg/dL — ABNORMAL HIGH (ref 70–99)
Glucose-Capillary: 118 mg/dL — ABNORMAL HIGH (ref 70–99)
Glucose-Capillary: 127 mg/dL — ABNORMAL HIGH (ref 70–99)

## 2023-02-07 SURGERY — LEFT HEART CATH AND CORONARY ANGIOGRAPHY
Anesthesia: LOCAL

## 2023-02-07 MED ORDER — HEPARIN SODIUM (PORCINE) 1000 UNIT/ML IJ SOLN
INTRAMUSCULAR | Status: AC
  Filled 2023-02-07: qty 10

## 2023-02-07 MED ORDER — FENTANYL CITRATE (PF) 100 MCG/2ML IJ SOLN
INTRAMUSCULAR | Status: DC | PRN
  Administered 2023-02-07: 50 ug via INTRAVENOUS

## 2023-02-07 MED ORDER — SODIUM CHLORIDE 0.9% FLUSH
3.0000 mL | INTRAVENOUS | Status: DC | PRN
  Administered 2023-02-14: 3 mL via INTRAVENOUS

## 2023-02-07 MED ORDER — LACTATED RINGERS IV BOLUS
250.0000 mL | Freq: Once | INTRAVENOUS | Status: AC
  Administered 2023-02-07: 250 mL via INTRAVENOUS

## 2023-02-07 MED ORDER — HEPARIN (PORCINE) IN NACL 1000-0.9 UT/500ML-% IV SOLN
INTRAVENOUS | Status: DC | PRN
  Administered 2023-02-07 (×2): 500 mL

## 2023-02-07 MED ORDER — SODIUM CHLORIDE 0.9 % IV SOLN: 250.0000 mL | INTRAVENOUS | Status: DC | PRN

## 2023-02-07 MED ORDER — FENTANYL CITRATE (PF) 100 MCG/2ML IJ SOLN
INTRAMUSCULAR | Status: AC
  Filled 2023-02-07: qty 2

## 2023-02-07 MED ORDER — HYDRALAZINE HCL 20 MG/ML IJ SOLN: 10.0000 mg | INTRAMUSCULAR | Status: AC | PRN

## 2023-02-07 MED ORDER — VERAPAMIL HCL 2.5 MG/ML IV SOLN
INTRAVENOUS | Status: AC
  Filled 2023-02-07: qty 2

## 2023-02-07 MED ORDER — LABETALOL HCL 5 MG/ML IV SOLN: 10.0000 mg | INTRAVENOUS | Status: AC | PRN

## 2023-02-07 MED ORDER — MIDAZOLAM HCL 2 MG/2ML IJ SOLN
INTRAMUSCULAR | Status: AC
  Filled 2023-02-07: qty 2

## 2023-02-07 MED ORDER — SODIUM CHLORIDE 0.9% FLUSH
3.0000 mL | Freq: Two times a day (BID) | INTRAVENOUS | Status: DC
  Administered 2023-02-08 – 2023-02-15 (×12): 3 mL via INTRAVENOUS

## 2023-02-07 MED ORDER — LIDOCAINE HCL (PF) 1 % IJ SOLN
INTRAMUSCULAR | Status: AC
  Filled 2023-02-07: qty 30

## 2023-02-07 MED ORDER — MIDAZOLAM HCL 2 MG/2ML IJ SOLN
INTRAMUSCULAR | Status: DC | PRN
  Administered 2023-02-07: 2 mg via INTRAVENOUS

## 2023-02-07 MED ORDER — LIDOCAINE HCL (PF) 1 % IJ SOLN
INTRAMUSCULAR | Status: DC | PRN
  Administered 2023-02-07: 2 mL

## 2023-02-07 MED ORDER — HEPARIN SODIUM (PORCINE) 1000 UNIT/ML IJ SOLN
INTRAMUSCULAR | Status: DC | PRN
  Administered 2023-02-07: 4500 [IU] via INTRAVENOUS

## 2023-02-07 MED ORDER — VERAPAMIL HCL 2.5 MG/ML IV SOLN
INTRAVENOUS | Status: DC | PRN
  Administered 2023-02-07: 10 mL via INTRA_ARTERIAL

## 2023-02-07 MED ORDER — IOHEXOL 350 MG/ML SOLN
INTRAVENOUS | Status: DC | PRN
  Administered 2023-02-07: 45 mL via INTRA_ARTERIAL

## 2023-02-07 MED ORDER — SODIUM CHLORIDE 0.9 % IV SOLN: INTRAVENOUS | Status: AC

## 2023-02-07 SURGICAL SUPPLY — 11 items
BAND CMPR LRG ZPHR (HEMOSTASIS) ×1
BAND ZEPHYR COMPRESS 30 LONG (HEMOSTASIS) IMPLANT
CATH 5FR JL3.5 JR4 ANG PIG MP (CATHETERS) IMPLANT
ELECT DEFIB PAD ADLT CADENCE (PAD) IMPLANT
GLIDESHEATH SLEND SS 6F .021 (SHEATH) IMPLANT
GUIDEWIRE INQWIRE 1.5J.035X260 (WIRE) IMPLANT
INQWIRE 1.5J .035X260CM (WIRE) ×1
KIT HEART LEFT (KITS) ×1 IMPLANT
PACK CARDIAC CATHETERIZATION (CUSTOM PROCEDURE TRAY) ×1 IMPLANT
TRANSDUCER W/STOPCOCK (MISCELLANEOUS) ×1 IMPLANT
TUBING CIL FLEX 10 FLL-RA (TUBING) ×1 IMPLANT

## 2023-02-07 NOTE — Care Management (Signed)
  Transition of Care [facility]) Screening Note   Patient Details  Name: Clayton Peterson Date of Birth: 01/02/1961   Transition of Care [facility]) CM/SW Contact:    Gala Lewandowsky, RN Phone Number: 02/07/2023, 12:52 PM    Transition of Care Department [facility]) has reviewed the patient and no TOC needs have been identified at this time. Patient presented for chest pain- plan for LHC today. Patient has PCP and insurance.  We will continue to monitor patient advancement through interdisciplinary progression rounds. If new patient transition needs arise, please place a TOC consult.

## 2023-02-07 NOTE — H&P (View-Only) (Signed)
Progress Note  Patient Name: Clayton Peterson Date of Encounter: 02/07/2023  Primary Cardiologist: Chrystie Nose, MD  Subjective   Had recurrent on/off CP this AM, feeling better now. EKG and troponins have remained negative.  Inpatient Medications    Scheduled Meds:  aspirin EC  81 mg Oral Daily   fenofibrate  160 mg Oral Daily   gabapentin  100 mg Oral Daily   insulin aspart  0-15 Units Subcutaneous TID WC   insulin aspart  0-5 Units Subcutaneous QHS   insulin glargine-yfgn  30 Units Subcutaneous Daily   metoprolol tartrate  50 mg Oral BID   nicotine  21 mg Transdermal Daily   omega-3 acid ethyl esters  2 g Oral BID   pantoprazole  40 mg Oral BID AC   rosuvastatin  40 mg Oral Daily   sodium chloride flush  3 mL Intravenous Q12H   Continuous Infusions:  sodium chloride     sodium chloride 1 mL/kg/hr (02/07/23 0452)   heparin 1,500 Units/hr (02/07/23 0730)   nitroGLYCERIN 25 mcg/min (02/07/23 0452)   PRN Meds: sodium chloride, acetaminophen, ondansetron (ZOFRAN) IV, sodium chloride flush   Vital Signs    Vitals:   02/07/23 0347 02/07/23 0758 02/07/23 0902 02/07/23 0903  BP: 134/72 122/71 105/67   Pulse: 93 74  80  Resp: 16     Temp: 98.1 F (36.7 C)     TempSrc: Oral     SpO2: 99% 98%    Weight:      Height:        Intake/Output Summary (Last 24 hours) at 02/07/2023 0918 Last data filed at 02/07/2023 0452 Gross per 24 hour  Intake 820.06 ml  Output 860 ml  Net -39.94 ml      02/06/2023   10:58 PM 02/06/2023    3:00 PM 01/24/2023    1:26 PM  Last 3 Weights  Weight (lbs) 201 lb 4.8 oz 208 lb 208 lb  Weight (kg) 91.309 kg 94.348 kg 94.348 kg     Telemetry    NSR - Personally Reviewed  ECG    NSR, old inferior infarct, early ST upsloping inferiorly with TWI avL - changes appear chronic - Personally Reviewed  Physical Exam   GEN: No acute distress.  HEENT: Normocephalic, atraumatic, sclera non-icteric. Neck: No JVD or bruits. Cardiac: RRR no  murmurs, rubs, or gallops.  Respiratory: Clear to auscultation bilaterally. Breathing is unlabored. GI: Soft, nontender, non-distended, BS +x 4. MS: no deformity. Extremities: No clubbing or cyanosis. No edema. Distal pedal pulses are 2+ and equal bilaterally. Neuro:  AAOx3. Follows commands. Psych:  Responds to questions appropriately with a normal affect.  Labs    High Sensitivity Troponin:   Recent Labs  Lab 02/06/23 0455 02/06/23 0630 02/06/23 1902 02/07/23 0538  TROPONINIHS Cardiac EnzymesNo results for input(s): "TROPONINI" in the last 168 hours. No results for input(s): "TROPIPOC" in the last 168 hours.   Chemistry Recent Labs  Lab 02/06/23 0455 02/06/23 0513 02/07/23 0218  NA 132* 135 134*  K 4.0 4.2 4.2  CL 101 104 102  CO2 17*  --  22  GLUCOSE 201* 199* 175*  BUN 23 27* 21  CREATININE 1.65* 1.70* 1.53*  CALCIUM 9.3  --  9.2  GFRNONAA 47*  --  51*  ANIONGAP 14  --  10     Hematology Recent Labs  Lab 02/06/23 0455 02/06/23 0513 02/07/23 0218  WBC 10.9*  --  9.8  RBC 5.20  --  4.95  HGB 15.2 15.3 14.1  HCT 44.0 45.0 43.0  MCV 84.6  --  86.9  MCH 29.2  --  28.5  MCHC 34.5  --  32.8  RDW 14.7  --  14.8  PLT 197  --  155    BNPNo results for input(s): "BNP", "PROBNP" in the last 168 hours.   DDimer No results for input(s): "DDIMER" in the last 168 hours.   Radiology    DG Chest Port 1 View  Result Date: 02/06/2023 CLINICAL DATA:  62 year old male with history of chest pain. EXAM: PORTABLE CHEST 1 VIEW COMPARISON:  Chest x-ray 12/28/2022. FINDINGS: Lung volumes are normal. No consolidative airspace disease. No pleural effusions. No pneumothorax. No pulmonary nodule or mass noted. Pulmonary vasculature and the cardiomediastinal silhouette are within normal limits. Atherosclerosis in the thoracic aorta. IMPRESSION: 1.  No radiographic evidence of acute cardiopulmonary disease. 2. Aortic atherosclerosis. Electronically Signed   By: Trudie Reed M.D.   On: 02/06/2023 06:13   CT Angio Chest Aorta W and/or Wo Contrast  Result Date: 02/06/2023 CLINICAL DATA:  62 year old male with history of chest pain that woke him from sleep. Suspected thoracic aortic aneurysm. EXAM: CT ANGIOGRAPHY CHEST WITH CONTRAST TECHNIQUE: Multidetector CT imaging of the chest was performed using the standard protocol during bolus administration of intravenous contrast. Multiplanar CT image reconstructions and MIPs were obtained to evaluate the vascular anatomy. RADIATION DOSE REDUCTION: This exam was performed according to the departmental dose-optimization program which includes automated exposure control, adjustment of the mA and/or kV according to patient size and/or use of iterative reconstruction technique. CONTRAST:  75mL OMNIPAQUE IOHEXOL 350 MG/ML SOLN COMPARISON:  CT of the chest, abdomen and pelvis 05/02/2022. FINDINGS: Cardiovascular: Heart size is normal. There is no significant pericardial fluid, thickening or pericardial calcification. There is aortic atherosclerosis, as well as atherosclerosis of the great vessels of the mediastinum and the coronary arteries, including calcified atherosclerotic plaque in the left main, left anterior descending, left circumflex and right coronary arteries. Thoracic aorta is normal in size measuring 3.4 cm, 3.0 cm and 2.8 cm in diameter in the ascending thoracic aorta, mid arch and descending thoracic aorta respectively. No evidence of thoracic aortic dissection. Bovine type arch (normal anatomical variant) incidentally noted. Mediastinum/Nodes: No pathologically enlarged mediastinal or hilar lymph nodes. Esophagus is unremarkable in appearance. No axillary lymphadenopathy. Lungs/Pleura: Dependent subsegmental atelectasis in the lower lobes of the lungs bilaterally. No acute consolidative airspace disease. No pleural effusions. No definite suspicious appearing pulmonary nodules or masses are noted. 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 Musculoskeletal: There are no aggressive appearing lytic or blastic lesions noted in the visualized portions of the skeleton. Review of the MIP images confirms the above findings. IMPRESSION: 1. No evidence of thoracic aortic aneurysm or dissection. 2. Aortic atherosclerosis, in addition to left main and three-vessel coronary artery disease. Please note that although the presence of coronary artery calcium documents the presence of coronary artery disease, the severity of this disease and any potential stenosis cannot be assessed on this non-gated CT examination. Assessment for potential risk factor modification, dietary therapy or pharmacologic therapy may be warranted,  if clinically indicated. 3. 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 Aortic Atherosclerosis (ICD10-I70.0). Electronically Signed   By: Trudie Reed M.D.   On: 02/06/2023 06:12    Cardiac Studies   Pending cath  Patient Profile     62 y.o. 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 Troponins negative. CTA showed aortic/coronary atherosclerosis without dissection.  Assessment & Plan    1. Chest pain, prior history of CAD, HLD - despite fairly pervasive symptoms, troponins have remained negative so hopeful that pain is noncardiac but plan is pursuit  of definitive cardiac catheterization today for evaluation - continue ASA, BB, anti-lipid therapy, IV heparin, IV NTG pending completion of cath - LDL 73, trig 218 in 12/2022 at which time PCP increased fenofibrate with plan for recheck 3 mo - will also order echocardiogram  Shared Decision Making/Informed Consent The risks [stroke (1 in 1000), death (1 in 1000), kidney failure [usually temporary] (1 in 500), bleeding (1 in 200), allergic reaction [possibly serious] (1 in 200)], benefits (diagnostic support and management of coronary artery disease) and alternatives of a cardiac catheterization were discussed in detail with Mr. Wiltgen and he is willing to proceed.   2. AAA s/p repair 2012 - continue OP f/u VVS  3. Essential HTN - patient already got AM lisinopril, will hold pending further pending cath given CKD - continue metoprolol, NTG drip  4. CKD stage 3b - prior baseline 1.7-1.9, 1.53 today  For questions or updates, please contact [facility] Please consult [email or web address] for contact info under Cardiology/STEMI.  Signed, Laurann Montana, PA-C 02/07/2023, 9:18 AM     Attending Note:   The patient was seen and examined.  Agree with assessment and plan as noted above.  Changes made to the above note as needed.  Patient seen and independently examined with Ronie Spies, PA .   We discussed all aspects of the encounter. I agree with the assessment and plan as stated above.    Chest pain.  Has continued to have chest pain.  Fortunately his enzymes remain negative.  He is scheduled for heart catheterization later this afternoon.  Given the fact that he has severe coronary artery calcifications with previous vascular surgery I think that heart catheterizations are next best option. We have discussed the risk, benefits, options of heart catheterization.  He understands and agrees to proceed.     I have spent a total of 40 minutes with patient reviewing hospital  notes ,  telemetry, EKGs, labs and examining patient as well as establishing an assessment and plan that was discussed with the patient.  > 50% of time was spent in direct patient care.    Vesta Mixer, Montez Hageman., MD, [facility] 02/07/2023, 9:43 AM [address]  [address] Office [phone] Pager [phone]

## 2023-02-07 NOTE — Progress Notes (Addendum)
Progress Note  Patient Name: Clayton Peterson Date of Encounter: 02/07/2023  Primary Cardiologist: Kenneth C Hilty, MD  Subjective   Had recurrent on/off CP this AM, feeling better now. EKG and troponins have remained negative.  Inpatient Medications    Scheduled Meds:  aspirin EC  81 mg Oral Daily   fenofibrate  160 mg Oral Daily   gabapentin  100 mg Oral Daily   insulin aspart  0-15 Units Subcutaneous TID WC   insulin aspart  0-5 Units Subcutaneous QHS   insulin glargine-yfgn  30 Units Subcutaneous Daily   metoprolol tartrate  50 mg Oral BID   nicotine  21 mg Transdermal Daily   omega-3 acid ethyl esters  2 g Oral BID   pantoprazole  40 mg Oral BID AC   rosuvastatin  40 mg Oral Daily   sodium chloride flush  3 mL Intravenous Q12H   Continuous Infusions:  sodium chloride     sodium chloride 1 mL/kg/hr (02/07/23 0452)   heparin 1,500 Units/hr (02/07/23 0730)   nitroGLYCERIN 25 mcg/min (02/07/23 0452)   PRN Meds: sodium chloride, acetaminophen, ondansetron (ZOFRAN) IV, sodium chloride flush   Vital Signs    Vitals:   02/07/23 0347 02/07/23 0758 02/07/23 0902 02/07/23 0903  BP: 134/72 122/71 105/67   Pulse: 93 74  80  Resp: 16     Temp: 98.1 F (36.7 C)     TempSrc: Oral     SpO2: 99% 98%    Weight:      Height:        Intake/Output Summary (Last 24 hours) at 02/07/2023 0918 Last data filed at 02/07/2023 0452 Gross per 24 hour  Intake 820.06 ml  Output 860 ml  Net -39.94 ml      02/06/2023   10:58 PM 02/06/2023    3:00 PM 01/24/2023    1:26 PM  Last 3 Weights  Weight (lbs) 201 lb 4.8 oz 208 lb 208 lb  Weight (kg) 91.309 kg 94.348 kg 94.348 kg     Telemetry    NSR - Personally Reviewed  ECG    NSR, old inferior infarct, early ST upsloping inferiorly with TWI avL - changes appear chronic - Personally Reviewed  Physical Exam   GEN: No acute distress.  HEENT: Normocephalic, atraumatic, sclera non-icteric. Neck: No JVD or bruits. Cardiac: RRR no  murmurs, rubs, or gallops.  Respiratory: Clear to auscultation bilaterally. Breathing is unlabored. GI: Soft, nontender, non-distended, BS +x 4. MS: no deformity. Extremities: No clubbing or cyanosis. No edema. Distal pedal pulses are 2+ and equal bilaterally. Neuro:  AAOx3. Follows commands. Psych:  Responds to questions appropriately with a normal affect.  Labs    High Sensitivity Troponin:   Recent Labs  Lab 02/06/23 0455 02/06/23 0630 02/06/23 1902 02/07/23 0538  TROPONINIHS 7 7 7 10      Cardiac EnzymesNo results for input(s): "TROPONINI" in the last 168 hours. No results for input(s): "TROPIPOC" in the last 168 hours.   Chemistry Recent Labs  Lab 02/06/23 0455 02/06/23 0513 02/07/23 0218  NA 132* 135 134*  K 4.0 4.2 4.2  CL 101 104 102  CO2 17*  --  22  GLUCOSE 201* 199* 175*  BUN 23 27* 21  CREATININE 1.65* 1.70* 1.53*  CALCIUM 9.3  --  9.2  GFRNONAA 47*  --  51*  ANIONGAP 14  --  10     Hematology Recent Labs  Lab 02/06/23 0455 02/06/23 0513 02/07/23 0218  WBC 10.9*  --    9.8  RBC 5.20  --  4.95  HGB 15.2 15.3 14.1  HCT 44.0 45.0 43.0  MCV 84.6  --  86.9  MCH 29.2  --  28.5  MCHC 34.5  --  32.8  RDW 14.7  --  14.8  PLT 197  --  155    BNPNo results for input(s): "BNP", "PROBNP" in the last 168 hours.   DDimer No results for input(s): "DDIMER" in the last 168 hours.   Radiology    DG Chest Port 1 View  Result Date: 02/06/2023 CLINICAL DATA:  62-year-old male with history of chest pain. EXAM: PORTABLE CHEST 1 VIEW COMPARISON:  Chest x-ray 12/28/2022. FINDINGS: Lung volumes are normal. No consolidative airspace disease. No pleural effusions. No pneumothorax. No pulmonary nodule or mass noted. Pulmonary vasculature and the cardiomediastinal silhouette are within normal limits. Atherosclerosis in the thoracic aorta. IMPRESSION: 1.  No radiographic evidence of acute cardiopulmonary disease. 2. Aortic atherosclerosis. Electronically Signed   By: Daniel   Entrikin M.D.   On: 02/06/2023 06:13   CT Angio Chest Aorta W and/or Wo Contrast  Result Date: 02/06/2023 CLINICAL DATA:  62-year-old male with history of chest pain that woke him from sleep. Suspected thoracic aortic aneurysm. EXAM: CT ANGIOGRAPHY CHEST WITH CONTRAST TECHNIQUE: Multidetector CT imaging of the chest was performed using the standard protocol during bolus administration of intravenous contrast. Multiplanar CT image reconstructions and MIPs were obtained to evaluate the vascular anatomy. RADIATION DOSE REDUCTION: This exam was performed according to the departmental dose-optimization program which includes automated exposure control, adjustment of the mA and/or kV according to patient size and/or use of iterative reconstruction technique. CONTRAST:  75mL OMNIPAQUE IOHEXOL 350 MG/ML SOLN COMPARISON:  CT of the chest, abdomen and pelvis 05/02/2022. FINDINGS: Cardiovascular: Heart size is normal. There is no significant pericardial fluid, thickening or pericardial calcification. There is aortic atherosclerosis, as well as atherosclerosis of the great vessels of the mediastinum and the coronary arteries, including calcified atherosclerotic plaque in the left main, left anterior descending, left circumflex and right coronary arteries. Thoracic aorta is normal in size measuring 3.4 cm, 3.0 cm and 2.8 cm in diameter in the ascending thoracic aorta, mid arch and descending thoracic aorta respectively. No evidence of thoracic aortic dissection. Bovine type arch (normal anatomical variant) incidentally noted. Mediastinum/Nodes: No pathologically enlarged mediastinal or hilar lymph nodes. Esophagus is unremarkable in appearance. No axillary lymphadenopathy. Lungs/Pleura: Dependent subsegmental atelectasis in the lower lobes of the lungs bilaterally. No acute consolidative airspace disease. No pleural effusions. No definite suspicious appearing pulmonary nodules or masses are noted. 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 Musculoskeletal: There are no aggressive appearing lytic or blastic lesions noted in the visualized portions of the skeleton. Review of the MIP images confirms the above findings. IMPRESSION: 1. No evidence of thoracic aortic aneurysm or dissection. 2. Aortic atherosclerosis, in addition to left main and three-vessel coronary artery disease. Please note that although the presence of coronary artery calcium documents the presence of coronary artery disease, the severity of this disease and any potential stenosis cannot be assessed on this non-gated CT examination. Assessment for potential risk factor modification, dietary therapy or pharmacologic therapy may be warranted,   if clinically indicated. 3. 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 Aortic Atherosclerosis (ICD10-I70.0). Electronically Signed   By: Daniel  Entrikin M.D.   On: 02/06/2023 06:12    Cardiac Studies   Pending cath  Patient Profile     62 y.o. male with CAD (MI 2012 s/p DES to [facility]), ruptured AAA s/p repair 2009 followed by [facility], hypertension, hyperlipidemia, DM type II, pancreatitis, duodenal ulcer, CKD stage 3b admitted with persistent chest pain, nausea, SOB, diaphoresis. Troponins negative. CTA showed aortic/coronary atherosclerosis without dissection.  Assessment & Plan    1. Chest pain, prior history of CAD, HLD - despite fairly pervasive symptoms, troponins have remained negative so hopeful that pain is noncardiac but plan is pursuit  of definitive cardiac catheterization today for evaluation - continue ASA, BB, anti-lipid therapy, IV heparin, IV NTG pending completion of cath - LDL 73, trig 218 in 12/2022 at which time PCP increased fenofibrate with plan for recheck 3 mo - will also order echocardiogram  Shared Decision Making/Informed Consent The risks [stroke (1 in 1000), death (1 in 1000), kidney failure [usually temporary] (1 in 500), bleeding (1 in 200), allergic reaction [possibly serious] (1 in 200)], benefits (diagnostic support and management of coronary artery disease) and alternatives of a cardiac catheterization were discussed in detail with Clayton Peterson and he is willing to proceed.   2. AAA s/p repair 2012 - continue OP f/u VVS  3. Essential HTN - patient already got AM lisinopril, will hold pending further pending cath given CKD - continue metoprolol, NTG drip  4. CKD stage 3b - prior baseline 1.7-1.9, 1.53 today  For questions or updates, please contact [facility] Please consult [email or web address] for contact info under Cardiology/STEMI.  Signed, Dayna N Dunn, PA-C 02/07/2023, 9:18 AM     Attending Note:   The patient was seen and examined.  Agree with assessment and plan as noted above.  Changes made to the above note as needed.  Patient seen and independently examined with Dayna Dunn, PA .   We discussed all aspects of the encounter. I agree with the assessment and plan as stated above.    Chest pain.  Has continued to have chest pain.  Fortunately his enzymes remain negative.  He is scheduled for heart catheterization later this afternoon.  Given the fact that he has severe coronary artery calcifications with previous vascular surgery I think that heart catheterizations are next best option. We have discussed the risk, benefits, options of heart catheterization.  He understands and agrees to proceed.     I have spent a total of 40 minutes with patient reviewing hospital  notes ,  telemetry, EKGs, labs and examining patient as well as establishing an assessment and plan that was discussed with the patient.  > 50% of time was spent in direct patient care.    Brandyn Thien J. Aquanetta Schwarz, Jr., MD, FACC 02/07/2023, 9:43 AM [address],  [address] Office - [phone] Pager [phone]    

## 2023-02-07 NOTE — Progress Notes (Addendum)
Received update from Dr. Clifton Peterson that cath did not show any new culprit lesions or explanation of chest pain. He has a chronic appearing RCA occlusion and RCA fills from collaterals. He had patent LAD and circumflex.  LVEDP is 6. Chest pain is felt noncardiac. Dr. Blake Peterson made aware in chat. Advised further evaluation of non-cardiac chest pain. From cardiac standpoint will await echo but do not anticipate further cardiac testing needed. Given hypotension earlier today would be prudent to observe and f/u renal function in AM post-cath. Plan relayed to nurse as well who will keep pt updated of plan.

## 2023-02-07 NOTE — Plan of Care (Signed)
  Problem: Education: Goal: Knowledge of General Education information will improve Description Including pain rating scale, medication(s)/side effects and non-pharmacologic comfort measures Outcome: Progressing   Problem: Nutrition: Goal: Adequate nutrition will be maintained Outcome: Progressing   Problem: Pain Managment: Goal: General experience of comfort will improve Outcome: Progressing   

## 2023-02-07 NOTE — Progress Notes (Signed)
Spoke to Dr Loney Loh, She is aware of pt's pain level of 5/10. Orders are for repeat troponin level, 12 leak EKG and Charge RN to re page Cardiology

## 2023-02-07 NOTE — Progress Notes (Signed)
I was added to a secure chat that transpired earlier between nurse and attendings on the case. Hypotension from earlier resolved after discontinuation of NTG and additional fluid bolus. Patient continued to have ongoing mild chest pain. Last 2 BPs were 104/70 and 96/61. Cath planned. Hold lisinopril going forward as outlined in rounding note today.

## 2023-02-07 NOTE — Progress Notes (Addendum)
[name], is a 62 y.o. male, DOB - 1960-12-31, EAV:[id number] Admit date - 02/06/2023    Outpatient Primary MD for the patient is Clayton Peterson, Clayton Child, FNP  LOS - 0  days    Brief summary    Clayton Peterson is a 62 y.o. male with medical history significant of CAD s/p stent, AAA s/p repair, DM, HTN, and HLD presenting with chest pain.  He reports prior h/o MI but last cath was in 2012.  Last night, he awoke from sleep about 0200 with severe chest pain.   H/o AAA repair, prior MI with stent.  Trop neg.  ECG unchanged.  CTA neg for dissection and no PE.   Assessment & Plan    Assessment and Plan:  Intermittent chest pain   Appears to have improved.  EKG does not show ischemic changes.  Troponins are negative.  In view of his CAD (s/p DES to [facility]), ruptured AAA s/p repair 2009 , scheduled for cardiac cath this afternoon.  Cardiology on board.  Continue with IV NTG, IV Heparin, aspirin, Metoprolol 50 mg BID.  and crestor 40 mg daily and fenofibrate 160 mg daily. Echocardiogram ordered.  Differential include PUD.    Hypertension:  Optimal BP parameters.   Insulin dependent DM type 2:  CBG (last 3)  Recent Labs    02/06/23 2200 02/07/23 0353 02/07/23 0855  GLUCAP 214* 182* 160*   Resume Semglee and SSI.  A1c is 8.7%. on 12/2022.     Mild leukocytosis  Resolved.      Stage 3b CKD;  Creatinine around 1.53.  Continue to monitor.        Estimated body mass index is 29.73 kg/m as calculated from the following:   Height as of this encounter:  (1.753 m).   Weight as of this encounter: 91.3 kg.  Code Status: full code.  DVT Prophylaxis:  Heparin.   Level of Care: Level of care: Telemetry Cardiac Family Communication: none at bedside.   Disposition Plan:     Remains inpatient appropriate:  pending further eval by cath  Procedures:  Cardiac  cath today.  Consultants:   cardiology  Antimicrobials:   Anti-infectives (From admission, onward)    None        Medications  Scheduled Meds:  aspirin EC  81 mg Oral Daily   fenofibrate  160 mg Oral Daily   gabapentin  100 mg Oral Daily   insulin aspart  0-15 Units Subcutaneous TID WC   insulin aspart  0-5 Units Subcutaneous QHS   insulin glargine-yfgn  30 Units Subcutaneous Daily   metoprolol tartrate  50 mg Oral BID   nicotine  21 mg Transdermal Daily   omega-3 acid ethyl esters  2 g Oral BID   pantoprazole  40 mg Oral BID AC   rosuvastatin  40 mg Oral Daily   sodium chloride flush  3 mL Intravenous Q12H   Continuous Infusions:  sodium chloride     sodium chloride 1 mL/kg/hr (02/07/23 0452)  heparin 1,500 Units/hr (02/07/23 0730)   nitroGLYCERIN 25 mcg/min (02/07/23 0452)   PRN Meds:.sodium chloride, acetaminophen, ondansetron (ZOFRAN) IV, sodium chloride flush    Subjective:   Clayton Peterson was seen and examined today.  No chest pain or sob this morning.    Objective:   Vitals:   02/07/23 0347 02/07/23 0758 02/07/23 0902 02/07/23 0903  BP: 134/72 122/71 105/67   Pulse: 93 74  80  Resp: 16     Temp: 98.1 F (36.7 C)     TempSrc: Oral     SpO2: 99% 98%    Weight:      Height:        Intake/Output Summary (Last 24 hours) at 02/07/2023 1009 Last data filed at 02/07/2023 0452 Gross per 24 hour  Intake 820.06 ml  Output 860 ml  Net -39.94 ml   Filed Weights   02/06/23 1500 02/06/23 2258  Weight: 94.3 kg 91.3 kg     Exam. General exam: Appears calm and comfortable  Respiratory system: Clear to auscultation. Respiratory effort normal. Cardiovascular system: S1 & S2 heard, RRR.  Gastrointestinal system: Abdomen is nondistended, soft and nontender. Central nervous system: Alert and oriented. No focal neurological deficits. Extremities: Symmetric 5 x 5 power. Skin: No rashes, Psychiatry: Mood & affect appropriate.     Data Reviewed:  I  have personally reviewed following labs and imaging studies   CBC Lab Results  Component Value Date   WBC 9.8 02/07/2023   RBC 4.95 02/07/2023   HGB 14.1 02/07/2023   HCT 43.0 02/07/2023   MCV 86.9 02/07/2023   MCH 28.5 02/07/2023   PLT 155 02/07/2023   MCHC 32.8 02/07/2023   RDW 14.8 02/07/2023   LYMPHSABS 1.1 12/29/2022   MONOABS 0.9 02/17/2022   EOSABS 0.1 12/29/2022   BASOSABS 0.1 12/29/2022     Last metabolic panel Lab Results  Component Value Date   NA 134 (L) 02/07/2023   K 4.2 02/07/2023   CL 102 02/07/2023   CO2 22 02/07/2023   BUN 21 02/07/2023   CREATININE 1.53 (H) 02/07/2023   GLUCOSE 175 (H) 02/07/2023   GFRNONAA 51 (L) 02/07/2023   CALCIUM 9.2 02/07/2023   PHOS 3.1 02/14/2022   PROT 6.5 12/29/2022   ALBUMIN 4.2 12/29/2022   LABGLOB 2.3 12/29/2022   AGRATIO 1.8 12/29/2022   BILITOT 0.6 12/29/2022   ALKPHOS 61 12/29/2022   AST 10 12/29/2022   ALT 13 12/29/2022   ANIONGAP 10 02/07/2023    CBG (last 3)  Recent Labs    02/06/23 2200 02/07/23 0353 02/07/23 0855  GLUCAP 214* 182* 160*      Coagulation Profile: No results for input(s): "INR", "PROTIME" in the last 168 hours.   Radiology Studies: DG Chest Port 1 View  Result Date: 02/06/2023 CLINICAL DATA:  62 year old male with history of chest pain. EXAM: PORTABLE CHEST 1 VIEW COMPARISON:  Chest x-ray 12/28/2022. FINDINGS: Lung volumes are normal. No consolidative airspace disease. No pleural effusions. No pneumothorax. No pulmonary nodule or mass noted. Pulmonary vasculature and the cardiomediastinal silhouette are within normal limits. Atherosclerosis in the thoracic aorta. IMPRESSION: 1.  No radiographic evidence of acute cardiopulmonary disease. 2. Aortic atherosclerosis. Electronically Signed   By: Trudie Reed M.D.   On: 02/06/2023 06:13   CT Angio Chest Aorta W and/or Wo Contrast  Result Date: 02/06/2023 CLINICAL DATA:  62 year old male with history of chest pain that woke him from  sleep. Suspected thoracic aortic aneurysm. EXAM: CT ANGIOGRAPHY CHEST WITH  CONTRAST TECHNIQUE: Multidetector CT imaging of the chest was performed using the standard protocol during bolus administration of intravenous contrast. Multiplanar CT image reconstructions and MIPs were obtained to evaluate the vascular anatomy. RADIATION DOSE REDUCTION: This exam was performed according to the departmental dose-optimization program which includes automated exposure control, adjustment of the mA and/or kV according to patient size and/or use of iterative reconstruction technique. CONTRAST:  75mL OMNIPAQUE IOHEXOL 350 MG/ML SOLN COMPARISON:  CT of the chest, abdomen and pelvis 05/02/2022. FINDINGS: Cardiovascular: Heart size is normal. There is no significant pericardial fluid, thickening or pericardial calcification. There is aortic atherosclerosis, as well as atherosclerosis of the great vessels of the mediastinum and the coronary arteries, including calcified atherosclerotic plaque in the left main, left anterior descending, left circumflex and right coronary arteries. Thoracic aorta is normal in size measuring 3.4 cm, 3.0 cm and 2.8 cm in diameter in the ascending thoracic aorta, mid arch and descending thoracic aorta respectively. No evidence of thoracic aortic dissection. Bovine type arch (normal anatomical variant) incidentally noted. Mediastinum/Nodes: No pathologically enlarged mediastinal or hilar lymph nodes. Esophagus is unremarkable in appearance. No axillary lymphadenopathy. Lungs/Pleura: Dependent subsegmental atelectasis in the lower lobes of the lungs bilaterally. No acute consolidative airspace disease. No pleural effusions. No definite suspicious appearing pulmonary nodules or masses are noted. 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 Musculoskeletal: There are no aggressive appearing lytic or blastic lesions noted in the visualized portions of the skeleton. Review of the MIP images confirms the above findings. IMPRESSION: 1. No evidence of thoracic aortic aneurysm or dissection. 2. Aortic atherosclerosis, in addition to left main and three-vessel coronary artery disease. Please note that although the presence of coronary artery calcium documents the presence of coronary artery disease, the severity of this disease and any potential stenosis cannot be assessed on this non-gated CT examination. Assessment for potential risk factor modification, dietary therapy or pharmacologic therapy may be warranted, if clinically indicated. 3. 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 Aortic Atherosclerosis (ICD10-I70.0). Electronically Signed   By: Trudie Reed M.D.   On: 02/06/2023 06:12       Kathlen Mody M.D. [facility] 02/07/2023, 10:09 AM  Available via Epic secure chat 7am-7pm After 7 pm, please refer to night coverage provider listed on amion.

## 2023-02-07 NOTE — Interval H&P Note (Signed)
History and Physical Interval Note:  02/07/2023 2:31 PM  [name]  has presented today for surgery, with the diagnosis of unstable angina.  The various methods of treatment have been discussed with the patient and family. After consideration of risks, benefits and other options for treatment, the patient has consented to  Procedure(s): LEFT HEART CATH AND CORONARY ANGIOGRAPHY (N/A) as a surgical intervention.  The patient's history has been reviewed, patient examined, no change in status, stable for surgery.  I have reviewed the patient's chart and labs.  Questions were answered to the patient's satisfaction.    Cath Lab Visit (complete for each Cath Lab visit)  Clinical Evaluation Leading to the Procedure:   ACS: No.  Non-ACS:    Anginal Classification: CCS III  Anti-ischemic medical therapy: No Therapy  Non-Invasive Test Results: No non-invasive testing performed  Prior CABG: No previous CABG        Verne Carrow

## 2023-02-07 NOTE — Progress Notes (Signed)
ANTICOAGULATION CONSULT NOTE -   Pharmacy Consult for heparin Indication: chest pain/ACS  Allergies  Allergen Reactions   Bee Venom Anaphylaxis   Coconut (Cocos Nucifera) Anaphylaxis    Patient Measurements: Height:  (175.3 cm) Weight: 91.3 kg (201 lb 4.8 oz) IBW/kg (Calculated) : 70.7 Heparin Dosing Weight: 90.2 kg  Vital Signs: Temp: 98.1 F (36.7 C) (04/17 0347) Temp Source: Oral (04/17 0347) BP: 134/72 (04/17 0347) Pulse Rate: 93 (04/17 0347)  Labs: Recent Labs    02/06/23 0455 02/06/23 0513 02/06/23 0630 02/06/23 1902 02/06/23 2200 02/07/23 0218 02/07/23 0538  HGB 15.2 15.3  --   --   --  14.1  --   HCT 44.0 45.0  --   --   --  43.0  --   PLT 197  --   --   --   --  155  --   APTT  --   --   --   --  43*  --   --   HEPARINUNFRC  --   --   --   --  <0.10* 0.43 0.25*  CREATININE 1.65* 1.70*  --   --   --  1.53*  --   TROPONINIHS 7  --  7 7  --   --   --      Estimated Creatinine Clearance: 56.6 mL/min (A) (by C-G formula based on SCr of 1.53 mg/dL (H)).   Medical History: Past Medical History:  Diagnosis Date   AAA (abdominal aortic aneurysm)    Coronary artery disease    Diabetes mellitus without complication    Heart attack    High cholesterol    Hypertension     Medications:  Medications Prior to Admission  Medication Sig Dispense Refill Last Dose   acetaminophen (TYLENOL) 325 MG tablet Take 325 mg by mouth every 4 (four) hours as needed for pain.   Past Week   aspirin 81 MG EC tablet Take 81 mg by mouth daily.   02/06/2023 at 0430   Cholecalciferol (VITAMIN D3) 125 MCG (5000 UT) TABS Take 1 tablet by mouth daily.   02/04/2023   empagliflozin (JARDIANCE) 10 MG TABS tablet Take 1 tablet (10 mg total) by mouth daily before breakfast. 30 tablet 2 02/05/2023   fenofibrate micronized (LOFIBRA) 200 MG capsule Take 1 capsule (200 mg total) by mouth daily before breakfast. 30 capsule 11 02/05/2023   gabapentin (NEURONTIN) 100 MG capsule Take 100 mg by  mouth daily.   02/05/2023   insulin glargine (LANTUS) 100 UNIT/ML injection Inject 30 Units into the skin daily.   02/05/2023 at pm   lisinopril (ZESTRIL) 20 MG tablet Take 20 mg by mouth daily.   02/05/2023   metFORMIN (GLUCOPHAGE) 1000 MG tablet Take 500 mg by mouth 2 (two) times daily with a meal.   02/05/2023 at pm   metoprolol tartrate (LOPRESSOR) 50 MG tablet Take 1 tablet (50 mg total) by mouth 2 (two) times daily. 60 tablet 3 02/05/2023 at 1730   nitroGLYCERIN (NITROSTAT) 0.4 MG SL tablet Place 1 tablet (0.4 mg total) under the tongue every 5 (five) minutes as needed for chest pain. 50 tablet 3 02/06/2023 at 0430   omega-3 acid ethyl esters (LOVAZA) 1 g capsule Take 2 capsules (2 g total) by mouth 2 (two) times daily. 120 capsule 3 02/05/2023 at pm   pantoprazole (PROTONIX) 40 MG tablet Take 1 tablet (40 mg total) by mouth 2 (two) times daily before a meal. 180 tablet 1  02/05/2023 at pm   rosuvastatin (CRESTOR) 40 MG tablet Take 40 mg by mouth daily.   02/05/2023 at pm   RELION PEN NEEDLE 31G/8MM 31G X 8 MM MISC       Scheduled:   aspirin EC  81 mg Oral Daily   fenofibrate  160 mg Oral Daily   gabapentin  100 mg Oral Daily   insulin aspart  0-15 Units Subcutaneous TID WC   insulin aspart  0-5 Units Subcutaneous QHS   insulin glargine-yfgn  30 Units Subcutaneous Daily   lisinopril  20 mg Oral Daily   metoprolol tartrate  50 mg Oral BID   nicotine  21 mg Transdermal Daily   omega-3 acid ethyl esters  2 g Oral BID   pantoprazole  40 mg Oral BID AC   rosuvastatin  40 mg Oral Daily   sodium chloride flush  3 mL Intravenous Q12H    Assessment: 62 YO male presenting to ED 4/16 with 10/10 chest pain that resolved with administration of Dilaudid (not responsive to morphine). Patient has a history of MI and CAD with last cath being 2012. No AC PTA. CTA chest found aortic atherosclerosis as well as left main and three-vessel CAD. Cardiology recommending [facility] inpatient if patient continues to have mild  chest discomfort. Pharmacy consulted to dose heparin.  -heparin level= 0.25 on 1300 units/hr -plans are for cardiac cath    Goal of Therapy:  Heparin level 0.3-0.7 units/ml Monitor platelets by anticoagulation protocol: Yes   Plan:  -Increase heparin to 1500 units/hr -Will follow plans post cath  Harland German, PharmD Clinical Pharmacist **Pharmacist phone directory can now be found on [email or web address] ([email or web address]).  Listed under [facility].   4/17/20247:11 AM

## 2023-02-07 NOTE — Progress Notes (Addendum)
Ntg d'ced as per MD . Normal saline bolus 250 cc ordered and started.Current BP at 104/70. Dr. Blake Divine and Dr. Elease Hashimoto updated.  Re: Mr [place] rm 1X91. BP at 76/40. rechecked just now at 69/58. Ntg reduced to 7.5 cc/hr. pt says he is not dizzy nor lightheaded. Says he feels a lot better with his chest pain. Normal saline still at 94 cc/hr

## 2023-02-07 NOTE — Progress Notes (Signed)
Call placed to dr Welton Flakes (cardiology) re: c/o left sided chest pain, 8/10,  radiating to back. Nitro increased from 15 mcg/min to 20 mcg/min. 12 lead EKG completed-  no new changes. Pain decreased to 5/10. Will . Gavin Pound Charge RN made aware.

## 2023-02-08 ENCOUNTER — Inpatient Hospital Stay (HOSPITAL_COMMUNITY): Payer: PPO

## 2023-02-08 ENCOUNTER — Encounter (HOSPITAL_COMMUNITY): Payer: Self-pay | Admitting: Cardiovascular Disease

## 2023-02-08 DIAGNOSIS — Z9189 Other specified personal risk factors, not elsewhere classified: Secondary | ICD-10-CM | POA: Diagnosis not present

## 2023-02-08 DIAGNOSIS — E1159 Type 2 diabetes mellitus with other circulatory complications: Secondary | ICD-10-CM | POA: Diagnosis not present

## 2023-02-08 DIAGNOSIS — Z87898 Personal history of other specified conditions: Secondary | ICD-10-CM

## 2023-02-08 DIAGNOSIS — G8929 Other chronic pain: Secondary | ICD-10-CM | POA: Diagnosis not present

## 2023-02-08 DIAGNOSIS — I10 Essential (primary) hypertension: Secondary | ICD-10-CM | POA: Diagnosis not present

## 2023-02-08 DIAGNOSIS — R1013 Epigastric pain: Secondary | ICD-10-CM | POA: Diagnosis not present

## 2023-02-08 DIAGNOSIS — K219 Gastro-esophageal reflux disease without esophagitis: Secondary | ICD-10-CM | POA: Diagnosis not present

## 2023-02-08 DIAGNOSIS — R079 Chest pain, unspecified: Secondary | ICD-10-CM

## 2023-02-08 DIAGNOSIS — I714 Abdominal aortic aneurysm, without rupture, unspecified: Secondary | ICD-10-CM | POA: Diagnosis not present

## 2023-02-08 LAB — ECHOCARDIOGRAM COMPLETE
AR max vel: 2.87 cm2
AV Peak grad: 6.5 mmHg
Ao pk vel: 1.27 m/s
Area-P 1/2: 4.8 cm2
Height: 69 in
S' Lateral: 3.4 cm
Weight: 3220.8 oz

## 2023-02-08 LAB — GLUCOSE, CAPILLARY
Glucose-Capillary: 108 mg/dL — ABNORMAL HIGH (ref 70–99)
Glucose-Capillary: 113 mg/dL — ABNORMAL HIGH (ref 70–99)
Glucose-Capillary: 217 mg/dL — ABNORMAL HIGH (ref 70–99)
Glucose-Capillary: 245 mg/dL — ABNORMAL HIGH (ref 70–99)
Glucose-Capillary: 96 mg/dL (ref 70–99)

## 2023-02-08 LAB — CBC
HCT: 39.5 % (ref 39.0–52.0)
Hemoglobin: 13.4 g/dL (ref 13.0–17.0)
MCH: 29.2 pg (ref 26.0–34.0)
MCHC: 33.9 g/dL (ref 30.0–36.0)
MCV: 86.1 fL (ref 80.0–100.0)
Platelets: 144 10*3/uL — ABNORMAL LOW (ref 150–400)
RBC: 4.59 MIL/uL (ref 4.22–5.81)
RDW: 15 % (ref 11.5–15.5)
WBC: 7 10*3/uL (ref 4.0–10.5)
nRBC: 0 % (ref 0.0–0.2)

## 2023-02-08 LAB — BASIC METABOLIC PANEL
Anion gap: 9 (ref 5–15)
BUN: 19 mg/dL (ref 8–23)
CO2: 22 mmol/L (ref 22–32)
Calcium: 8.6 mg/dL — ABNORMAL LOW (ref 8.9–10.3)
Chloride: 103 mmol/L (ref 98–111)
Creatinine, Ser: 1.52 mg/dL — ABNORMAL HIGH (ref 0.61–1.24)
GFR, Estimated: 52 mL/min — ABNORMAL LOW (ref >60)
Glucose, Bld: 89 mg/dL (ref 70–99)
Potassium: 3.9 mmol/L (ref 3.5–5.1)
Sodium: 134 mmol/L — ABNORMAL LOW (ref 135–145)

## 2023-02-08 LAB — HEPATIC FUNCTION PANEL
ALT: 13 U/L (ref 0–44)
AST: 13 U/L — ABNORMAL LOW (ref 15–41)
Albumin: 2.7 g/dL — ABNORMAL LOW (ref 3.5–5.0)
Alkaline Phosphatase: 42 U/L (ref 38–126)
Bilirubin, Direct: <0.1 mg/dL (ref 0.0–0.2)
Total Bilirubin: 0.5 mg/dL (ref 0.3–1.2)
Total Protein: 5.7 g/dL — ABNORMAL LOW (ref 6.5–8.1)

## 2023-02-08 LAB — LIPASE, BLOOD: Lipase: 29 U/L (ref 11–51)

## 2023-02-08 LAB — AMYLASE: Amylase: 40 U/L (ref 28–100)

## 2023-02-08 LAB — LIPOPROTEIN A (LPA): Lipoprotein (a): 35.3 nmol/L — ABNORMAL HIGH (ref <75.0)

## 2023-02-08 MED ORDER — FAMOTIDINE 20 MG PO TABS
20.0000 mg | ORAL_TABLET | Freq: Two times a day (BID) | ORAL | Status: DC
  Administered 2023-02-08 – 2023-02-15 (×15): 20 mg via ORAL
  Filled 2023-02-08 (×15): qty 1

## 2023-02-08 NOTE — H&P (View-Only) (Signed)
 Referring Provider:  Dr. Akula, TRH Primary Care Physician:  Del Orbe Polanco, Iliana, FNP Primary Gastroenterologist:  Dr. Carver  Reason for Consultation:  Epigastric pain/atypical chest pain  HPI: Clayton Peterson is a 62 y.o. male with medical history significant of CAD s/p stent, AAA s/p repair, DM, HTN, and HLD presenting with epigastric abdominal and chest pain.  He follows with Dr. Carver and was just seen by them 2 weeks ago in their office.  He has GERD, had a duodenal ulcer that was thought to be related to NSAIDs back in August 2022 and has been continued on PPI therapy.  Had an episode of pancreatitis in August 2023 of unclear etiology, but triglycerides were around 700 at that time.  He also has a pseudocyst that developed from that, but it appears decreased in size on the most recent CT scan last month.  Nonetheless, he complains of some epigastric discomfort and bloating sensation ongoingly, but the pain that brought him to the emergency department woke him from sleep around 0200 in the morning.  He describes this is starting high up under his epigastrium and radiating into his chest.  Trop neg.  ECG unchanged.  CTA neg for dissection and no PE.  Cardiac cath, did not show any blockages . He has chronic RCA occlusion with collaterals. He has patent LAD and circumflex.   GI was consulted to see if GI source of pain.  Amylase and lipase are normal.    He reports that this acute pain has about completely resolved.  The ongoing discomfort occurs more so after eating and feels like a bloating or pressure sensation.  He no longer uses NSAIDs.  He had tried Creon previously, but it did not seem to help and it was expensive.  CT scan of the abdomen and pelvis with and without contrast 12/13/22:  IMPRESSION: No acute findings.  No radiographic signs of acute pancreatitis.   Decreased size of 4.6 cm heterogeneous cystic lesion in pancreatic tail, consistent with resolving pseudocyst.    Stable chronic splenic vein thrombosis.   Stable moderate hepatic steatosis.   Left nephrolithiasis. No evidence of ureteral calculi or hydronephrosis.   Previous abdominal aortic aneurysm repair. 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    EGD with Dr. Carver in August 2022 showed gastritis and a nonbleeding duodenal ulcer suspected due to NSAID use.  Colonoscopy August 2022 by Dr. Carver with only internal hemorrhoids, 2 polyps that were removed and some congested mucosa in the cecum.  A. STOMACH, BIOPSY:  - Gastric antral and oxyntic mucosa with chronic gastritis  - Warthin Starry stain is negative for Helicobacter pylori   B. COLON, CECAL, MUCOSA, BIOPSY:  - Polypoid colonic mucosa with ectatic vascular channels, see comment  - Negative for dysplasia or malignancy   C. COLON, TRANSVERSE, POLYPECTOMY:  - Tubular adenoma  - Negative for high-grade dysplasia or malignancy   D. COLON, SIGMOID, POLYPECTOMY:  - Tubular adenoma  - Negative for high-grade dysplasia or malignancy  COMMENT:   B. Small vascular malformation cannot be ruled out.    Past Medical History:  Diagnosis Date   AAA (abdominal aortic aneurysm)    Coronary artery disease    Diabetes mellitus without complication    Heart attack    High cholesterol    Hypertension     Past Surgical History:  Procedure Laterality Date   ABDOMINAL AORTIC ANEURYSM REPAIR     BACK SURGERY       x2   BIOPSY  06/03/2021   Procedure: BIOPSY;  Surgeon: Carver, Charles K, DO;  Location: [facility];  Service: Endoscopy;;   CARDIAC CATHETERIZATION     stent placed on right side ofo heart   COLONOSCOPY WITH PROPOFOL N/A 06/03/2021   Surgeon: Carver, Charles K, DO; Nonbleeding internal hemorrhoids, 6 mm polyp in sigmoid colon, 2 mm polyp in the ascending colon, congested and erythematous mucosa in the cecum biopsied.  Pathology revealed 2 tubular adenomas, cecal biopsy with polypoid  colonic mucosa with ectatic vascular channels with small vascular malformation not ruled out. Repeat in 5 years.   ESOPHAGOGASTRODUODENOSCOPY (EGD) WITH PROPOFOL N/A 06/03/2021   Surgeon: Carver, Charles K, DO; Gastritis biopsied, nonobstructing, nonbleeding duodenal ulcer with no stigmata of bleeding, NSAID induced etiology.  Pathology with chronic gastritis, negative for H. pylori.   KNEE SURGERY Left    LEFT HEART CATH AND CORONARY ANGIOGRAPHY N/A 02/07/2023   Procedure: LEFT HEART CATH AND CORONARY ANGIOGRAPHY;  Surgeon: McAlhany, Christopher D, MD;  Location: [facility];  Service: Cardiovascular;  Laterality: N/A;   PARTIAL COLECTOMY     POLYPECTOMY  06/03/2021   Procedure: POLYPECTOMY INTESTINAL;  Surgeon: Carver, Charles K, DO;  Location: [facility];  Service: Endoscopy;;    Prior to Admission medications   Medication Sig Start Date End Date Taking? Authorizing Provider  acetaminophen (TYLENOL) 325 MG tablet Take 325 mg by mouth every 4 (four) hours as needed for pain. 11/16/19  Yes [provider]  aspirin 81 MG EC tablet Take 81 mg by mouth daily.   Yes [provider]  Cholecalciferol (VITAMIN D3) 125 MCG (5000 UT) TABS Take 1 tablet by mouth daily.   Yes [provider]  empagliflozin (JARDIANCE) 10 MG TABS tablet Take 1 tablet (10 mg total) by mouth daily before breakfast. 01/02/23  Yes Del Orbe Polanco, Iliana, FNP  fenofibrate micronized (LOFIBRA) 200 MG capsule Take 1 capsule (200 mg total) by mouth daily before breakfast. 01/02/23 12/28/23 Yes Del Orbe Polanco, Iliana, FNP  gabapentin (NEURONTIN) 100 MG capsule Take 100 mg by mouth daily. 05/05/21  Yes [provider]  insulin glargine (LANTUS) 100 UNIT/ML injection Inject 30 Units into the skin daily.   Yes [provider]  lisinopril (ZESTRIL) 20 MG tablet Take 20 mg by mouth daily. 03/29/21  Yes [provider]  metFORMIN (GLUCOPHAGE) 1000 MG tablet Take 500 mg by mouth 2  (two) times daily with a meal. 03/18/21  Yes [provider]  metoprolol tartrate (LOPRESSOR) 50 MG tablet Take 1 tablet (50 mg total) by mouth 2 (two) times daily. 12/28/22  Yes Del Orbe Polanco, Iliana, FNP  nitroGLYCERIN (NITROSTAT) 0.4 MG SL tablet Place 1 tablet (0.4 mg total) under the tongue every 5 (five) minutes as needed for chest pain. 12/28/22  Yes Del Orbe Polanco, Iliana, FNP  omega-3 acid ethyl esters (LOVAZA) 1 g capsule Take 2 capsules (2 g total) by mouth 2 (two) times daily. 02/17/22 02/06/24 Yes Girguis, David, MD  pantoprazole (PROTONIX) 40 MG tablet Take 1 tablet (40 mg total) by mouth 2 (two) times daily before a meal. 12/13/22  Yes Lewis, Leslie S, PA-C  rosuvastatin (CRESTOR) 40 MG tablet Take 40 mg by mouth daily.   Yes [provider]  RELION PEN NEEDLE 31G/8MM 31G X 8 MM MISC  07/05/22   [provider]    Current Facility-Administered Medications  Medication Dose Route Frequency Provider Last Rate Last Admin   0.9 %    sodium chloride infusion  250 mL Intravenous PRN McAlhany, Christopher D, MD       0.9 %  sodium chloride infusion  250 mL Intravenous PRN McAlhany, Christopher D, MD       acetaminophen (TYLENOL) tablet 650 mg  650 mg Oral Q4H PRN McAlhany, Christopher D, MD   650 mg at 02/07/23 0740   aspirin EC tablet 81 mg  81 mg Oral Daily McAlhany, Christopher D, MD   81 mg at 02/08/23 0900   famotidine (PEPCID) tablet 20 mg  20 mg Oral BID Akula, Vijaya, MD   20 mg at 02/08/23 1114   fenofibrate tablet 160 mg  160 mg Oral Daily McAlhany, Christopher D, MD   160 mg at 02/08/23 0901   gabapentin (NEURONTIN) capsule 100 mg  100 mg Oral Daily McAlhany, Christopher D, MD   100 mg at 02/08/23 0901   insulin aspart (novoLOG) injection 0-15 Units  0-15 Units Subcutaneous TID WC McAlhany, Christopher D, MD   5 Units at 02/08/23 0901   insulin aspart (novoLOG) injection 0-5 Units  0-5 Units Subcutaneous QHS McAlhany, Christopher D, MD   2 Units at 02/06/23  2207   insulin glargine-yfgn (SEMGLEE) injection 30 Units  30 Units Subcutaneous Daily McAlhany, Christopher D, MD   30 Units at 02/08/23 0901   metoprolol tartrate (LOPRESSOR) tablet 50 mg  50 mg Oral BID McAlhany, Christopher D, MD   50 mg at 02/08/23 0900   nicotine (NICODERM CQ - dosed in mg/24 hours) patch 21 mg  21 mg Transdermal Daily McAlhany, Christopher D, MD   21 mg at 02/08/23 0902   omega-3 acid ethyl esters (LOVAZA) capsule 2 g  2 g Oral BID McAlhany, Christopher D, MD   2 g at 02/08/23 0901   ondansetron (ZOFRAN) injection 4 mg  4 mg Intravenous Q6H PRN McAlhany, Christopher D, MD       pantoprazole (PROTONIX) EC tablet 40 mg  40 mg Oral BID AC McAlhany, Christopher D, MD   40 mg at 02/08/23 0900   rosuvastatin (CRESTOR) tablet 40 mg  40 mg Oral Daily McAlhany, Christopher D, MD   40 mg at 02/08/23 0900   sodium chloride flush (NS) 0.9 % injection 3 mL  3 mL Intravenous Q12H McAlhany, Christopher D, MD   3 mL at 02/08/23 0903   sodium chloride flush (NS) 0.9 % injection 3 mL  3 mL Intravenous PRN McAlhany, Christopher D, MD       sodium chloride flush (NS) 0.9 % injection 3 mL  3 mL Intravenous Q12H McAlhany, Christopher D, MD   3 mL at 02/08/23 0903   sodium chloride flush (NS) 0.9 % injection 3 mL  3 mL Intravenous PRN McAlhany, Christopher D, MD        Allergies as of 02/06/2023 - Review Complete 02/06/2023  Allergen Reaction Noted   Bee venom Anaphylaxis 05/22/2021   Coconut (cocos nucifera) Anaphylaxis 05/22/2021    Family History  Problem Relation Age of Onset   Atrial fibrillation Mother    Alzheimer's disease Mother    Diabetes Brother    Alzheimer's disease Maternal Grandmother    Aneurysm Maternal Grandfather    Pancreatitis Neg Hx    Pancreatic cancer Neg Hx    Colon cancer Neg Hx     Social History   Socioeconomic History   Marital status: Married    Spouse name: Not on file   Number of children: Not on file   Years of education: Not   on file   Highest  education level: Not on file  Occupational History   Occupation: disabled, former truck driver  Tobacco Use   Smoking status: Every Day    Packs/day: 1.50    Years: 40.00    Additional pack years: 0.00    Total pack years: 60.00    Types: Cigarettes    Passive exposure: Current   Smokeless tobacco: Never  Vaping Use   Vaping Use: Never used  Substance and Sexual Activity   Alcohol use: Yes    Comment: very seldom   Drug use: Never   Sexual activity: Yes  Other Topics Concern   Not on file  Social History Narrative   Not on file   Social Determinants of Health   Financial Resource Strain: Not on file  Food Insecurity: No Food Insecurity (02/06/2023)   Hunger Vital Sign    Worried About Running Out of Food in the Last Year: Never true    Ran Out of Food in the Last Year: Never true  Transportation Needs: No Transportation Needs (02/06/2023)   PRAPARE - Transportation    Lack of Transportation (Medical): No    Lack of Transportation (Non-Medical): No  Physical Activity: Not on file  Stress: Not on file  Social Connections: Not on file  Intimate Partner Violence: Not At Risk (02/06/2023)   Humiliation, Afraid, Rape, and Kick questionnaire    Fear of Current or Ex-Partner: No    Emotionally Abused: No    Physically Abused: No    Sexually Abused: No    Review of Systems: ROS is O/W negative except as mentioned in HPI.  Physical Exam: Vital signs in last 24 hours: Temp:  [98.2 F (36.8 C)-99 F (37.2 C)] 98.9 F (37.2 C) (04/18 0254) Pulse Rate:  [58-68] 61 (04/17 2241) Resp:  [11-20] 20 (04/18 0254) BP: (95-122)/(55-74) 95/64 (04/18 0254) SpO2:  [90 %-98 %] 94 % (04/18 0254)   General:  Alert, Well-developed, well-nourished, pleasant and cooperative in NAD Head:  Normocephalic and atraumatic. Eyes:  Sclera clear, no icterus.  Conjunctiva pink. Ears:  Normal auditory acuity. Mouth:  No deformity or lesions.   Lungs:  Clear throughout to auscultation.  No  wheezes, crackles, or rhonchi.  Heart:  Regular rate and rhythm; no murmurs, clicks, rubs, or gallops. Abdomen:  Soft, non-distended.  Ventral hernia noted at the site of his AAA repair.  BS present.  Some epigastric TTP. Msk:  Symmetrical without gross deformities. Pulses:  Normal pulses noted. Extremities:  Without clubbing or edema. Neurologic:  Alert and oriented x 4;  grossly normal neurologically. Skin:  Intact without significant lesions or rashes. Psych:  Alert and cooperative. Normal mood and affect.  Intake/Output from previous day: 04/17 0701 - 04/18 0700 In: -  Out: 650 [Urine:650] Intake/Output this shift: Total I/O In: -  Out: 475 [Urine:475]  Lab Results: Recent Labs    02/06/23 0455 02/06/23 0513 02/07/23 0218 02/08/23 0202  WBC 10.9*  --  9.8 7.0  HGB 15.2 15.3 14.1 13.4  HCT 44.0 45.0 43.0 39.5  PLT 197  --  155 144*   BMET Recent Labs    02/06/23 0455 02/06/23 0513 02/07/23 0218 02/08/23 0202  NA 132* 135 134* 134*  K 4.0 4.2 4.2 3.9  CL 101 104 102 103  CO2 17*  --  22 22  GLUCOSE 201* 199* 175* 89  BUN 23 27* 21 19  CREATININE 1.65* 1.70* 1.53* 1.52*  CALCIUM 9.3  --    9.2 8.6*   LFT Recent Labs    02/08/23 0202  PROT 5.7*  ALBUMIN 2.7*  AST 13*  ALT 13  ALKPHOS 42  BILITOT 0.5  BILIDIR <0.1  IBILI NOT CALCULATED   Studies/Results: CARDIAC CATHETERIZATION  Result Date: 02/07/2023   Prox RCA to Mid RCA lesion is 100% stenosed.   Prox LAD lesion is 40% stenosed.   Prox Cx to Dist Cx lesion is 20% stenosed.   The left ventricular systolic function is normal. Mild proximal LAD stenosis Mild plaque through the mid Circumflex. The large dominant RCA is occluded in the mid stented segment. This appears chronic. Mid and distal RCA fills from left to right collaterals. Normal LV systolic function LVEDP 6 mmHg Recommendations: Non-cardiac chest pain. Medical management of CAD.    IMPRESSION:  *Epigastric abdominal pain/atypical chest pain:  He has continued to complain of some epigastric abdominal pain and bloating ongoingly to his outpatient GI.  He does have a pseudocyst, but decreased in size.  The pain that brought him to the hospital though was an acute pain that woke him from sleep.  Amylase and lipase are normal so doubt any acute pancreatitis.  Pain is improved, close to completely resolved. *Episode of pancreatitis in April 2023 of unclear etiology but triglycerides were 709 at that time.  Is decreased in size on recent CT scan. *Nonbleeding duodenal ulcer seen on EGD in 05/2021 was thought related to NSAID use.  He no longer uses NSAIDs. *Previous AAA with repair:  Has a ventral/incisional hernia from that as well.  PLAN: -He would like to go home.  Acute pain is improved, close to completely resolved.  Could consider EGD, but this could be inpatient versus outpatient with Dr. Carver. -Continue his home dose of pantoprazole 40 mg twice daily.  They also have him on Pepcid twice daily here.  Could consider adding Carafate tablet if desired.   Darald Uzzle D. Vylet Maffia  02/08/2023, 12:49 PM      

## 2023-02-08 NOTE — Progress Notes (Signed)
Echocardiogram 2D Echocardiogram has been performed.  Lucendia Herrlich 02/08/2023, 12:05 PM

## 2023-02-08 NOTE — Progress Notes (Signed)
2000: Pt inquiring why he has not been discharged yet, day time and night time MDs paged. Per notes, pt is clear for discharged and chose to have an outpatient EGD. Night MD came to assess pt before discharging him and pt changed his mind and decided to stay the night to possibly have the EGD tomorrow.

## 2023-02-08 NOTE — Consult Note (Signed)
Referring Provider:  Dr. Blake Divine, [facility] Primary Care Physician:  Rica Records, FNP Primary Gastroenterologist:  Dr. Marletta Lor  Reason for Consultation:  Epigastric pain/atypical chest pain  HPI: Natalie Leclaire is a 62 y.o. male with medical history significant of CAD s/p stent, AAA s/p repair, DM, HTN, and HLD presenting with epigastric abdominal and chest pain.  He follows with Dr. Marletta Lor and was just seen by them 2 weeks ago in their office.  He has GERD, had a duodenal ulcer that was thought to be related to NSAIDs back in August 2022 and has been continued on PPI therapy.  Had an episode of pancreatitis in August 2023 of unclear etiology, but triglycerides were around 700 at that time.  He also has a pseudocyst that developed from that, but it appears decreased in size on the most recent CT scan last month.  Nonetheless, he complains of some epigastric discomfort and bloating sensation ongoingly, but the pain that brought him to the emergency department woke him from sleep around 0200 in the morning.  He describes this is starting high up under his epigastrium and radiating into his chest.  Trop neg.  ECG unchanged.  CTA neg for dissection and no PE.  Cardiac cath, did not show any blockages . He has chronic RCA occlusion with collaterals. He has patent LAD and circumflex.   GI was consulted to see if GI source of pain.  Amylase and lipase are normal.    He reports that this acute pain has about completely resolved.  The ongoing discomfort occurs more so after eating and feels like a bloating or pressure sensation.  He no longer uses NSAIDs.  He had tried Creon previously, but it did not seem to help and it was expensive.  CT scan of the abdomen and pelvis with and without contrast 12/13/22:  IMPRESSION: No acute findings.  No radiographic signs of acute pancreatitis.   Decreased size of 4.6 cm heterogeneous cystic lesion in pancreatic tail, consistent with resolving pseudocyst.    Stable chronic splenic vein thrombosis.   Stable moderate hepatic steatosis.   Left nephrolithiasis. No evidence of ureteral calculi or hydronephrosis.   Previous abdominal aortic aneurysm repair. 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    EGD with Dr. Marletta Lor in August 2022 showed gastritis and a nonbleeding duodenal ulcer suspected due to NSAID use.  Colonoscopy August 2022 by Dr. Marletta Lor with only internal hemorrhoids, 2 polyps that were removed and some congested mucosa in the cecum.  A. STOMACH, BIOPSY:  - Gastric antral and oxyntic mucosa with chronic gastritis  - Warthin Starry stain is negative for Helicobacter pylori   B. COLON, CECAL, MUCOSA, BIOPSY:  - Polypoid colonic mucosa with ectatic vascular channels, see comment  - Negative for dysplasia or malignancy   C. COLON, TRANSVERSE, POLYPECTOMY:  - Tubular adenoma  - Negative for high-grade dysplasia or malignancy   D. COLON, SIGMOID, POLYPECTOMY:  - Tubular adenoma  - Negative for high-grade dysplasia or malignancy  COMMENT:   B. Small vascular malformation cannot be ruled out.    Past Medical History:  Diagnosis Date   AAA (abdominal aortic aneurysm)    Coronary artery disease    Diabetes mellitus without complication    Heart attack    High cholesterol    Hypertension     Past Surgical History:  Procedure Laterality Date   ABDOMINAL AORTIC ANEURYSM REPAIR     BACK SURGERY  x2   BIOPSY  06/03/2021   Procedure: BIOPSY;  Surgeon: Lanelle Bal, DO;  Location: [facility];  Service: Endoscopy;;   CARDIAC CATHETERIZATION     stent placed on right side ofo heart   COLONOSCOPY WITH PROPOFOL N/A 06/03/2021   Surgeon: Lanelle Bal, DO; Nonbleeding internal hemorrhoids, 6 mm polyp in sigmoid colon, 2 mm polyp in the ascending colon, congested and erythematous mucosa in the cecum biopsied.  Pathology revealed 2 tubular adenomas, cecal biopsy with polypoid  colonic mucosa with ectatic vascular channels with small vascular malformation not ruled out. Repeat in 5 years.   ESOPHAGOGASTRODUODENOSCOPY (EGD) WITH PROPOFOL N/A 06/03/2021   Surgeon: Lanelle Bal, DO; Gastritis biopsied, nonobstructing, nonbleeding duodenal ulcer with no stigmata of bleeding, NSAID induced etiology.  Pathology with chronic gastritis, negative for H. pylori.   KNEE SURGERY Left    LEFT HEART CATH AND CORONARY ANGIOGRAPHY N/A 02/07/2023   Procedure: LEFT HEART CATH AND CORONARY ANGIOGRAPHY;  Surgeon: Kathleene Hazel, MD;  Location: [facility];  Service: Cardiovascular;  Laterality: N/A;   PARTIAL COLECTOMY     POLYPECTOMY  06/03/2021   Procedure: POLYPECTOMY INTESTINAL;  Surgeon: Lanelle Bal, DO;  Location: [facility];  Service: Endoscopy;;    Prior to Admission medications   Medication Sig Start Date End Date Taking? Authorizing Provider  acetaminophen (TYLENOL) 325 MG tablet Take 325 mg by mouth every 4 (four) hours as needed for pain. 11/16/19  Yes [provider]  aspirin 81 MG EC tablet Take 81 mg by mouth daily.   Yes [provider]  Cholecalciferol (VITAMIN D3) 125 MCG (5000 UT) TABS Take 1 tablet by mouth daily.   Yes [provider]  empagliflozin (JARDIANCE) 10 MG TABS tablet Take 1 tablet (10 mg total) by mouth daily before breakfast. 01/02/23  Yes Del Newman Nip, [place], FNP  fenofibrate micronized (LOFIBRA) 200 MG capsule Take 1 capsule (200 mg total) by mouth daily before breakfast. 01/02/23 12/28/23 Yes Del Newman Nip, Tenna Child, FNP  gabapentin (NEURONTIN) 100 MG capsule Take 100 mg by mouth daily. 05/05/21  Yes [provider]  insulin glargine (LANTUS) 100 UNIT/ML injection Inject 30 Units into the skin daily.   Yes [provider]  lisinopril (ZESTRIL) 20 MG tablet Take 20 mg by mouth daily. 03/29/21  Yes [provider]  metFORMIN (GLUCOPHAGE) 1000 MG tablet Take 500 mg by mouth 2  (two) times daily with a meal. 03/18/21  Yes [provider]  metoprolol tartrate (LOPRESSOR) 50 MG tablet Take 1 tablet (50 mg total) by mouth 2 (two) times daily. 12/28/22  Yes Del Newman Nip, Tenna Child, FNP  nitroGLYCERIN (NITROSTAT) 0.4 MG SL tablet Place 1 tablet (0.4 mg total) under the tongue every 5 (five) minutes as needed for chest pain. 12/28/22  Yes Del Newman Nip, Tenna Child, FNP  omega-3 acid ethyl esters (LOVAZA) 1 g capsule Take 2 capsules (2 g total) by mouth 2 (two) times daily. 02/17/22 02/06/24 Yes Lewie Chamber, MD  pantoprazole (PROTONIX) 40 MG tablet Take 1 tablet (40 mg total) by mouth 2 (two) times daily before a meal. 12/13/22  Yes Tiffany Kocher, PA-C  rosuvastatin (CRESTOR) 40 MG tablet Take 40 mg by mouth daily.   Yes [provider]  RELION PEN NEEDLE 31G/8MM 31G X 8 MM MISC  07/05/22   [provider]    Current Facility-Administered Medications  Medication Dose Route Frequency Provider Last Rate Last Admin   0.9 %  sodium chloride infusion  250 mL Intravenous PRN Verne Carrow D, MD       0.9 %  sodium chloride infusion  250 mL Intravenous PRN Kathleene Hazel, MD       acetaminophen (TYLENOL) tablet 650 mg  650 mg Oral Q4H PRN Kathleene Hazel, MD   650 mg at 02/07/23 0740   aspirin EC tablet 81 mg  81 mg Oral Daily Kathleene Hazel, MD   81 mg at 02/08/23 0900   famotidine (PEPCID) tablet 20 mg  20 mg Oral BID Kathlen Mody, MD   20 mg at 02/08/23 1114   fenofibrate tablet 160 mg  160 mg Oral Daily Kathleene Hazel, MD   160 mg at 02/08/23 0901   gabapentin (NEURONTIN) capsule 100 mg  100 mg Oral Daily Kathleene Hazel, MD   100 mg at 02/08/23 0901   insulin aspart (novoLOG) injection 0-15 Units  0-15 Units Subcutaneous TID WC Kathleene Hazel, MD   5 Units at 02/08/23 0901   insulin aspart (novoLOG) injection 0-5 Units  0-5 Units Subcutaneous QHS Kathleene Hazel, MD   2 Units at 02/06/23  2207   insulin glargine-yfgn (SEMGLEE) injection 30 Units  30 Units Subcutaneous Daily Kathleene Hazel, MD   30 Units at 02/08/23 0901   metoprolol tartrate (LOPRESSOR) tablet 50 mg  50 mg Oral BID Kathleene Hazel, MD   50 mg at 02/08/23 0900   nicotine (NICODERM CQ - dosed in mg/24 hours) patch 21 mg  21 mg Transdermal Daily Kathleene Hazel, MD   21 mg at 02/08/23 0902   omega-3 acid ethyl esters (LOVAZA) capsule 2 g  2 g Oral BID Kathleene Hazel, MD   2 g at 02/08/23 0901   ondansetron (ZOFRAN) injection 4 mg  4 mg Intravenous Q6H PRN Kathleene Hazel, MD       pantoprazole (PROTONIX) EC tablet 40 mg  40 mg Oral BID AC Kathleene Hazel, MD   40 mg at 02/08/23 0900   rosuvastatin (CRESTOR) tablet 40 mg  40 mg Oral Daily Kathleene Hazel, MD   40 mg at 02/08/23 0900   sodium chloride flush (NS) 0.9 % injection 3 mL  3 mL Intravenous Q12H Kathleene Hazel, MD   3 mL at 02/08/23 1610   sodium chloride flush (NS) 0.9 % injection 3 mL  3 mL Intravenous PRN Kathleene Hazel, MD       sodium chloride flush (NS) 0.9 % injection 3 mL  3 mL Intravenous Q12H Kathleene Hazel, MD   3 mL at 02/08/23 9604   sodium chloride flush (NS) 0.9 % injection 3 mL  3 mL Intravenous PRN Kathleene Hazel, MD        Allergies as of 02/06/2023 - Review Complete 02/06/2023  Allergen Reaction Noted   Bee venom Anaphylaxis 05/22/2021   Coconut (cocos nucifera) Anaphylaxis 05/22/2021    Family History  Problem Relation Age of Onset   Atrial fibrillation Mother    Alzheimer's disease Mother    Diabetes Brother    Alzheimer's disease Maternal Grandmother    Aneurysm Maternal Grandfather    Pancreatitis Neg Hx    Pancreatic cancer Neg Hx    Colon cancer Neg Hx     Social History   Socioeconomic History   Marital status: Married    Spouse name: Not on file   Number of children: Not on file   Years of education: Not  on file   Highest  education level: Not on file  Occupational History   Occupation: disabled, former truck driver  Tobacco Use   Smoking status: Every Day    Packs/day: 1.50    Years: 40.00    Additional pack years: 0.00    Total pack years: 60.00    Types: Cigarettes    Passive exposure: Current   Smokeless tobacco: Never  Vaping Use   Vaping Use: Never used  Substance and Sexual Activity   Alcohol use: Yes    Comment: very seldom   Drug use: Never   Sexual activity: Yes  Other Topics Concern   Not on file  Social History Narrative   Not on file   Social Determinants of Health   Financial Resource Strain: Not on file  Food Insecurity: No Food Insecurity (02/06/2023)   Hunger Vital Sign    Worried About Running Out of Food in the Last Year: Never true    Ran Out of Food in the Last Year: Never true  Transportation Needs: No Transportation Needs (02/06/2023)   PRAPARE - Administrator, Civil Service (Medical): No    Lack of Transportation (Non-Medical): No  Physical Activity: Not on file  Stress: Not on file  Social Connections: Not on file  Intimate Partner Violence: Not At Risk (02/06/2023)   Humiliation, Afraid, Rape, and Kick questionnaire    Fear of Current or Ex-Partner: No    Emotionally Abused: No    Physically Abused: No    Sexually Abused: No    Review of Systems: ROS is O/W negative except as mentioned in HPI.  Physical Exam: Vital signs in last 24 hours: Temp:  [98.2 F (36.8 C)-99 F (37.2 C)] 98.9 F (37.2 C) (04/18 0254) Pulse Rate:  [58-68] 61 (04/17 2241) Resp:  [11-20] 20 (04/18 0254) BP: (95-122)/(55-74) 95/64 (04/18 0254) SpO2:  [90 %-98 %] 94 % (04/18 0254)   General:  Alert, Well-developed, well-nourished, pleasant and cooperative in NAD Head:  Normocephalic and atraumatic. Eyes:  Sclera clear, no icterus.  Conjunctiva pink. Ears:  Normal auditory acuity. Mouth:  No deformity or lesions.   Lungs:  Clear throughout to auscultation.  No  wheezes, crackles, or rhonchi.  Heart:  Regular rate and rhythm; no murmurs, clicks, rubs, or gallops. Abdomen:  Soft, non-distended.  Ventral hernia noted at the site of his AAA repair.  BS present.  Some epigastric TTP. Msk:  Symmetrical without gross deformities. Pulses:  Normal pulses noted. Extremities:  Without clubbing or edema. Neurologic:  Alert and oriented x 4;  grossly normal neurologically. Skin:  Intact without significant lesions or rashes. Psych:  Alert and cooperative. Normal mood and affect.  Intake/Output from previous day: 04/17 0701 - 04/18 0700 In: -  Out: 650 [Urine:650] Intake/Output this shift: Total I/O In: -  Out: 475 [Urine:475]  Lab Results: Recent Labs    02/06/23 0455 02/06/23 0513 02/07/23 0218 02/08/23 0202  WBC 10.9*  --  9.8 7.0  HGB 15.2 15.3 14.1 13.4  HCT 44.0 45.0 43.0 39.5  PLT 197  --  155 144*   BMET Recent Labs    02/06/23 0455 02/06/23 0513 02/07/23 0218 02/08/23 0202  NA 132* 135 134* 134*  K 4.0 4.2 4.2 3.9  CL 101 104 102 103  CO2 17*  --  22 22  GLUCOSE 201* 199* 175* 89  BUN 23 27* 21 19  CREATININE 1.65* 1.70* 1.53* 1.52*  CALCIUM 9.3  --  9.2 8.6*   LFT Recent Labs    02/08/23 0202  PROT 5.7*  ALBUMIN 2.7*  AST 13*  ALT 13  ALKPHOS 42  BILITOT 0.5  BILIDIR <0.1  IBILI NOT CALCULATED   Studies/Results: CARDIAC CATHETERIZATION  Result Date: 02/07/2023   Prox RCA to Mid RCA lesion is 100% stenosed.   Prox LAD lesion is 40% stenosed.   Prox Cx to Dist Cx lesion is 20% stenosed.   The left ventricular systolic function is normal. Mild proximal LAD stenosis Mild plaque through the mid Circumflex. The large dominant RCA is occluded in the mid stented segment. This appears chronic. Mid and distal RCA fills from left to right collaterals. Normal LV systolic function LVEDP 6 mmHg Recommendations: Non-cardiac chest pain. Medical management of CAD.    IMPRESSION:  *Epigastric abdominal pain/atypical chest pain:  He has continued to complain of some epigastric abdominal pain and bloating ongoingly to his outpatient GI.  He does have a pseudocyst, but decreased in size.  The pain that brought him to the hospital though was an acute pain that woke him from sleep.  Amylase and lipase are normal so doubt any acute pancreatitis.  Pain is improved, close to completely resolved. *Episode of pancreatitis in April 2023 of unclear etiology but triglycerides were 709 at that time.  Is decreased in size on recent CT scan. *Nonbleeding duodenal ulcer seen on EGD in 05/2021 was thought related to NSAID use.  He no longer uses NSAIDs. *Previous AAA with repair:  Has a ventral/incisional hernia from that as well.  PLAN: -He would like to go home.  Acute pain is improved, close to completely resolved.  Could consider EGD, but this could be inpatient versus outpatient with Dr. Marletta Lor. -Continue his home dose of pantoprazole 40 mg twice daily.  They also have him on Pepcid twice daily here.  Could consider adding Carafate tablet if desired.   Princella Pellegrini. Conn Trombetta  02/08/2023, 12:49 PM

## 2023-02-08 NOTE — Discharge Instructions (Addendum)
Post-Cath Instructions: No driving for 2 days. No lifting over 5 lbs for 1 week. No sexual activity for 1 week. Keep procedure site clean & dry. If you notice increased pain, swelling, bleeding or pus, call/return!  You may shower, but no soaking baths/hot tubs/pools for 1 week.   Advised to follow-up with primary care physician in 1 week. Advised to follow-up with gastroenterology Dr. Meridee Score on May 2 for pancreatic necronectomy.. Advised to take ciprofloxacin 500 mg twice daily for total of 30 days.

## 2023-02-08 NOTE — Progress Notes (Signed)
Echo unrevealing. Tried to call into pt room, no response. Relayed result to nurse and IM to relay to pt. Has f/u in 02/2023 with Dr. Rennis Golden. Post cath instructions added to Discharge instructions section.   [facility] will sign off.   Medication Recommendations:  see rounding note today Other recommendations (labs, testing, etc):  w/u of noncardiac CP per primary team Follow up as an outpatient:  03/15/23 with Dr. Rennis Golden

## 2023-02-08 NOTE — Progress Notes (Addendum)
Progress Note  Patient Name: Clayton Peterson Date of Encounter: 02/08/2023  Primary Cardiologist: Chrystie Nose, MD  Subjective   Still feels intermittent lower chest pain, upper epigastric pain. Wonders about the fact that he had an ulcer last year. Otherwise no acute complaints. SBP 90s at times but asymptomatic. NTG remains off.  Inpatient Medications    Scheduled Meds:  aspirin EC  81 mg Oral Daily   fenofibrate  160 mg Oral Daily   gabapentin  100 mg Oral Daily   insulin aspart  0-15 Units Subcutaneous TID WC   insulin aspart  0-5 Units Subcutaneous QHS   insulin glargine-yfgn  30 Units Subcutaneous Daily   metoprolol tartrate  50 mg Oral BID   nicotine  21 mg Transdermal Daily   omega-3 acid ethyl esters  2 g Oral BID   pantoprazole  40 mg Oral BID AC   rosuvastatin  40 mg Oral Daily   sodium chloride flush  3 mL Intravenous Q12H   sodium chloride flush  3 mL Intravenous Q12H   Continuous Infusions:  sodium chloride     sodium chloride     PRN Meds: sodium chloride, sodium chloride, acetaminophen, ondansetron (ZOFRAN) IV, sodium chloride flush, sodium chloride flush   Vital Signs    Vitals:   02/07/23 1532 02/07/23 1927 02/07/23 2241 02/08/23 0254  BP: (!) 96/55 106/66  95/64  Pulse: (!) 59 62 61   Resp: Temp: 98.2 F (36.8 C) 99 F (37.2 C)  98.9 F (37.2 C)  TempSrc: Oral Oral  Oral  SpO2:  94% 96% 94%  Weight:      Height:        Intake/Output Summary (Last 24 hours) at 02/08/2023 0747 Last data filed at 02/08/2023 0036 Gross per 24 hour  Intake --  Output 650 ml  Net -650 ml      02/06/2023   10:58 PM 02/06/2023    3:00 PM 01/24/2023    1:26 PM  Last 3 Weights  Weight (lbs) 201 lb 4.8 oz 208 lb 208 lb  Weight (kg) 91.309 kg 94.348 kg 94.348 kg     Telemetry    NSR (brief SB 49bpm early AM hours) - Personally Reviewed  Physical Exam   GEN: No acute distress.  HEENT: Normocephalic, atraumatic, sclera non-icteric. Neck: No  JVD or bruits. Cardiac: RRR no murmurs, rubs, or gallops.  Respiratory: Clear to auscultation bilaterally. Breathing is unlabored. GI: Soft, nontender, non-distended, BS +x 4. MS: no deformity. Extremities: No clubbing or cyanosis. No edema. Distal pedal pulses are 2+ and equal bilaterally. Right radial cath site without hematoma or ecchymosis; good pulse. Neuro:  AAOx3. Follows commands. Psych:  Responds to questions appropriately with a normal affect.  Labs    High Sensitivity Troponin:   Recent Labs  Lab 02/06/23 0455 02/06/23 0630 02/06/23 1902 02/07/23 0538  TROPONINIHS Cardiac EnzymesNo results for input(s): "TROPONINI" in the last 168 hours. No results for input(s): "TROPIPOC" in the last 168 hours.   Chemistry Recent Labs  Lab 02/06/23 0455 02/06/23 0513 02/07/23 0218 02/08/23 0202  NA 132* 135 134* 134*  K 4.0 4.2 4.2 3.9  CL 101 104 102 103  CO2 17*  --  22 22  GLUCOSE 201* 199* 175* 89  BUN 23 27* 21 19  CREATININE 1.65* 1.70* 1.53* 1.52*  CALCIUM 9.3  --  9.2 8.6*  GFRNONAA 47*  --  51* 52*  ANIONGAP 14  --  10 9     Hematology Recent Labs  Lab 02/06/23 0455 02/06/23 0513 02/07/23 0218 02/08/23 0202  WBC 10.9*  --  9.8 7.0  RBC 5.20  --  4.95 4.59  HGB 15.2 15.3 14.1 13.4  HCT 44.0 45.0 43.0 39.5  MCV 84.6  --  86.9 86.1  MCH 29.2  --  28.5 29.2  MCHC 34.5  --  32.8 33.9  RDW 14.7  --  14.8 15.0  PLT 197  --  155 144*    BNPNo results for input(s): "BNP", "PROBNP" in the last 168 hours.   DDimer No results for input(s): "DDIMER" in the last 168 hours.   Radiology    CARDIAC CATHETERIZATION  Result Date: 02/07/2023   Prox RCA to Mid RCA lesion is 100% stenosed.   Prox LAD lesion is 40% stenosed.   Prox Cx to Dist Cx lesion is 20% stenosed.   The left ventricular systolic function is normal. Mild proximal LAD stenosis Mild plaque through the mid Circumflex. The large dominant RCA is occluded in the mid stented segment. This  appears chronic. Mid and distal RCA fills from left to right collaterals. Normal LV systolic function LVEDP 6 mmHg Recommendations: Non-cardiac chest pain. Medical management of CAD.    Cardiac Studies   Cath as above  Patient Profile     62 y.o. male with CAD (MI 2012 s/p DES to [facility]), ruptured AAA s/p repair 2009 followed by [facility], hypertension, hyperlipidemia, DM type II, pancreatitis, duodenal ulcer, CKD stage 3b admitted with persistent chest pain, nausea, SOB, diaphoresis. Troponins negative. CTA showed aortic/coronary atherosclerosis without dissection.   Assessment & Plan    1. Chest pain, prior history of CAD, HLD - chest pain felt noncardiac, supported by negative troponins, unchanged EKG, and cath findings. Cath did not show any new culprit lesions or cardiac explanation of chest pain. He has a chronic appearing RCA occlusion and RCA fills from collaterals. He had patent LAD and circumflex. LVEDP was 6 - further eval per primary team  - LDL 73, trig 218 in 12/2022 at which time PCP increased fenofibrate with plan for recheck 3 mo - echo pending - on ASA, BB, antilipid therapy   2. AAA s/p repair 2012 - CT angio chest aorta without dissection this admission - CT 11/2022 with previous AAA repair, stable 2.5 cm right common iliac and 1.5 cm right femoral artery aneurysm - continue OP f/u [facility]   3. Essential HTN with hypotension (dropped BP to 60s-70s on 4/17) - BP still soft this AM in 90s despite IVF, DC of IV NTG - hold lisinopril  - echo pending but otherwise further evaluation of hyoptension per medicine team   4. CKD stage 3b - prior baseline 1.7-1.9, stable in 1.5 range today   Has f/u with Dr. Rennis Golden in May.  For questions or updates, please contact [facility] Please consult [email or web address] for contact info under Cardiology/STEMI.  Signed, Laurann Montana, PA-C 02/08/2023, 7:47 AM    Attending Note:   The patient was seen and examined.  Agree with assessment  and plan as noted above.  Changes made to the above note as needed.  Patient seen and independently examined with Ronie Spies, PA .   We discussed all aspects of the encounter. I agree with the assessment and plan as stated above.    Chest pain :  has a CTO of proximal RCA with great collateral flow  from left side.   Has mild - mod CAD of LAD and LCx.    Echo shows normal LV function   Pt is ok to go home today    I have spent a total of 40 minutes with patient reviewing hospital  notes , telemetry, EKGs, labs and examining patient as well as establishing an assessment and plan that was discussed with the patient.  > 50% of time was spent in direct patient care.    Vesta Mixer, Montez Hageman., MD, [facility] 02/08/2023, 11:07 AM [address],  [address] Office [phone] Pager [phone]

## 2023-02-08 NOTE — Progress Notes (Addendum)
Clayton Peterson, is a 62 y.o. male, DOB - Sep 12, 1961, EXB:[id number] Admit date - 02/06/2023    Outpatient Primary MD for the patient is Del Newman Nip, Tenna Child, FNP  LOS - 1  days    Brief summary    Boyce Keltner is a 62 y.o. male with medical history significant of CAD s/p stent, AAA s/p repair, DM, HTN, and HLD presenting with chest pain.  He reports prior h/o MI but last cath was in 2012.  Last night, he awoke from sleep about 0200 with severe chest pain.   H/o AAA repair, prior MI with stent.  Trop neg.  ECG unchanged.  CTA neg for dissection and no PE.   Assessment & Plan    Assessment and Plan:  Intermittent chest pain   Appears to have improved.  EKG does not show ischemic changes.  Troponins are negative.  In view of his CAD (s/p DES to [facility]), ruptured AAA s/p repair 2009 , scheduled for cardiac cath, did not show any blockages . He has chronic RCA occlusion with collaterals. He has patent LAD and circumflex.  Meanwhile continue with Aspirin, Metoprolol 50 mg BID.  and crestor 40 mg daily and fenofibrate 160 mg daily. Echocardiogram ordered.  Differential include PUD. GI consulted, as he reports dull epigastric pain .  Last EGD in 8/22 showing gastritis and non bleeding duodenal ulcer.  Pepcid added. Liver panl wnl. Lipase and amylase wnl.    Hypertension:  BP parameters are borderline low.   Insulin dependent DM type 2:  CBG (last 3)  Recent Labs    02/07/23 1932 02/08/23 0034 02/08/23 0855  GLUCAP 127* 113* 245*    Resume Semglee and SSI.  A1c is 8.7%. on 12/2022.     Mild leukocytosis  Resolved.    Stage 3b CKD;  Creatinine stable around 1.5 Continue to monitor.    Mild hyponatremia:  Sodium at 134.     Estimated body mass index is 29.73 kg/m as calculated from the following:   Height as of this encounter:  (1.753 m).   Weight as  of this encounter: 91.3 kg.  Code Status: full code.  DVT Prophylaxis:  Heparin.   Level of Care: Level of care: Telemetry Cardiac Family Communication: none at bedside.   Disposition Plan:     Remains inpatient appropriate:  pending eval by GI.   Procedures:  Cardiac catheterization.   Consultants:   Cardiology GAstroenterology.   Antimicrobials:   Anti-infectives (From admission, onward)    None        Medications  Scheduled Meds:  aspirin EC  81 mg Oral Daily   famotidine  20 mg Oral BID   fenofibrate  160 mg Oral Daily   gabapentin  100 mg Oral Daily   insulin aspart  0-15 Units Subcutaneous TID WC   insulin aspart  0-5 Units Subcutaneous QHS   insulin glargine-yfgn  30 Units Subcutaneous Daily   metoprolol tartrate  50 mg Oral BID   nicotine  21  mg Transdermal Daily   omega-3 acid ethyl esters  2 g Oral BID   pantoprazole  40 mg Oral BID AC   rosuvastatin  40 mg Oral Daily   sodium chloride flush  3 mL Intravenous Q12H   sodium chloride flush  3 mL Intravenous Q12H   Continuous Infusions:  sodium chloride     sodium chloride     PRN Meds:.sodium chloride, sodium chloride, acetaminophen, ondansetron (ZOFRAN) IV, sodium chloride flush, sodium chloride flush    Subjective:   [name] was seen and examined today.  Some abdominal pain.    Objective:   Vitals:   02/07/23 1532 02/07/23 1927 02/07/23 2241 02/08/23 0254  BP: (!) 96/55 106/66  95/64  Pulse: (!) 59 62 61   Resp: 19 18 16 20   Temp: 98.2 F (36.8 C) 99 F (37.2 C)  98.9 F (37.2 C)  TempSrc: Oral Oral  Oral  SpO2:  94% 96% 94%  Weight:      Height:        Intake/Output Summary (Last 24 hours) at 02/08/2023 1218 Last data filed at 02/08/2023 0908 Gross per 24 hour  Intake --  Output 1125 ml  Net -1125 ml    Filed Weights   02/06/23 1500 02/06/23 2258  Weight: 94.3 kg 91.3 kg     Exam. General exam: Appears calm and comfortable  Respiratory system: Clear to  auscultation. Respiratory effort normal. Cardiovascular system: S1 & S2 heard, RRR. No JVD, murmurs, rubs, gallops or clicks. No pedal edema. Gastrointestinal system: Abdomen is nondistended, soft and nontender. No organomegaly or masses felt. Normal bowel sounds heard. Central nervous system: Alert and oriented. No focal neurological deficits. Extremities: Symmetric 5 x 5 power. Skin: No rashes, lesions or ulcers Psychiatry: Judgement and insight appear normal. Mood & affect appropriate.      Data Reviewed:  I have personally reviewed following labs and imaging studies   CBC Lab Results  Component Value Date   WBC 7.0 02/08/2023   RBC 4.59 02/08/2023   HGB 13.4 02/08/2023   HCT 39.5 02/08/2023   MCV 86.1 02/08/2023   MCH 29.2 02/08/2023   PLT 144 (L) 02/08/2023   MCHC 33.9 02/08/2023   RDW 15.0 02/08/2023   LYMPHSABS 1.1 12/29/2022   MONOABS 0.9 02/17/2022   EOSABS 0.1 12/29/2022   BASOSABS 0.1 12/29/2022     Last metabolic panel Lab Results  Component Value Date   NA 134 (L) 02/08/2023   K 3.9 02/08/2023   CL 103 02/08/2023   CO2 22 02/08/2023   BUN 19 02/08/2023   CREATININE 1.52 (H) 02/08/2023   GLUCOSE 89 02/08/2023   GFRNONAA 52 (L) 02/08/2023   CALCIUM 8.6 (L) 02/08/2023   PHOS 3.1 02/14/2022   PROT 5.7 (L) 02/08/2023   ALBUMIN 2.7 (L) 02/08/2023   LABGLOB 2.3 12/29/2022   AGRATIO 1.8 12/29/2022   BILITOT 0.5 02/08/2023   ALKPHOS 42 02/08/2023   AST 13 (L) 02/08/2023   ALT 13 02/08/2023   ANIONGAP 9 02/08/2023    CBG (last 3)  Recent Labs    02/07/23 1932 02/08/23 0034 02/08/23 0855  GLUCAP 127* 113* 245*       Coagulation Profile: No results for input(s): "INR", "PROTIME" in the last 168 hours.   Radiology Studies: CARDIAC CATHETERIZATION  Result Date: 02/07/2023   Prox RCA to Mid RCA lesion is 100% stenosed.   Prox LAD lesion is 40% stenosed.   Prox Cx to Dist Cx lesion is 20%  stenosed.   The left ventricular systolic function is  normal. Mild proximal LAD stenosis Mild plaque through the mid Circumflex. The large dominant RCA is occluded in the mid stented segment. This appears chronic. Mid and distal RCA fills from left to right collaterals. Normal LV systolic function LVEDP 6 mmHg Recommendations: Non-cardiac chest pain. Medical management of CAD.       Kathlen Mody M.D. [facility] 02/08/2023, 12:18 PM  Available via Epic secure chat 7am-7pm After 7 pm, please refer to night coverage provider listed on amion.

## 2023-02-09 ENCOUNTER — Encounter (HOSPITAL_COMMUNITY)
Admission: EM | Disposition: A | Discharge status: Home / Self Care | Payer: Self-pay | Source: Home / Self Care | Attending: Internal Medicine

## 2023-02-09 ENCOUNTER — Inpatient Hospital Stay (HOSPITAL_COMMUNITY): Payer: PPO | Admitting: Anesthesiology

## 2023-02-09 ENCOUNTER — Inpatient Hospital Stay (HOSPITAL_COMMUNITY): Payer: PPO

## 2023-02-09 DIAGNOSIS — K259 Gastric ulcer, unspecified as acute or chronic, without hemorrhage or perforation: Secondary | ICD-10-CM

## 2023-02-09 DIAGNOSIS — I714 Abdominal aortic aneurysm, without rupture, unspecified: Secondary | ICD-10-CM | POA: Diagnosis not present

## 2023-02-09 DIAGNOSIS — R079 Chest pain, unspecified: Secondary | ICD-10-CM | POA: Diagnosis not present

## 2023-02-09 DIAGNOSIS — I10 Essential (primary) hypertension: Secondary | ICD-10-CM

## 2023-02-09 DIAGNOSIS — E1159 Type 2 diabetes mellitus with other circulatory complications: Secondary | ICD-10-CM | POA: Diagnosis not present

## 2023-02-09 DIAGNOSIS — F17210 Nicotine dependence, cigarettes, uncomplicated: Secondary | ICD-10-CM

## 2023-02-09 DIAGNOSIS — K2950 Unspecified chronic gastritis without bleeding: Secondary | ICD-10-CM | POA: Diagnosis not present

## 2023-02-09 DIAGNOSIS — I2510 Atherosclerotic heart disease of native coronary artery without angina pectoris: Secondary | ICD-10-CM | POA: Diagnosis not present

## 2023-02-09 DIAGNOSIS — E119 Type 2 diabetes mellitus without complications: Secondary | ICD-10-CM

## 2023-02-09 DIAGNOSIS — K449 Diaphragmatic hernia without obstruction or gangrene: Secondary | ICD-10-CM

## 2023-02-09 HISTORY — PX: BIOPSY: SHX5522

## 2023-02-09 HISTORY — PX: ESOPHAGOGASTRODUODENOSCOPY (EGD) WITH PROPOFOL: SHX5813

## 2023-02-09 LAB — GLUCOSE, CAPILLARY
Glucose-Capillary: 114 mg/dL — ABNORMAL HIGH (ref 70–99)
Glucose-Capillary: 200 mg/dL — ABNORMAL HIGH (ref 70–99)
Glucose-Capillary: 106 mg/dL — ABNORMAL HIGH (ref 70–99)
Glucose-Capillary: 202 mg/dL — ABNORMAL HIGH (ref 70–99)

## 2023-02-09 LAB — BASIC METABOLIC PANEL
Anion gap: 10 (ref 5–15)
BUN: 20 mg/dL (ref 8–23)
CO2: 23 mmol/L (ref 22–32)
Calcium: 9.1 mg/dL (ref 8.9–10.3)
Chloride: 101 mmol/L (ref 98–111)
Creatinine, Ser: 1.58 mg/dL — ABNORMAL HIGH (ref 0.61–1.24)
GFR, Estimated: 49 mL/min — ABNORMAL LOW (ref >60)
Glucose, Bld: 113 mg/dL — ABNORMAL HIGH (ref 70–99)
Potassium: 4.1 mmol/L (ref 3.5–5.1)
Sodium: 134 mmol/L — ABNORMAL LOW (ref 135–145)

## 2023-02-09 LAB — CBC
HCT: 41.2 % (ref 39.0–52.0)
Hemoglobin: 13.9 g/dL (ref 13.0–17.0)
MCH: 28.8 pg (ref 26.0–34.0)
MCHC: 33.7 g/dL (ref 30.0–36.0)
MCV: 85.5 fL (ref 80.0–100.0)
Platelets: 146 10*3/uL — ABNORMAL LOW (ref 150–400)
RBC: 4.82 MIL/uL (ref 4.22–5.81)
RDW: 14.5 % (ref 11.5–15.5)
WBC: 8.2 10*3/uL (ref 4.0–10.5)
nRBC: 0 % (ref 0.0–0.2)

## 2023-02-09 SURGERY — ESOPHAGOGASTRODUODENOSCOPY (EGD) WITH PROPOFOL
Anesthesia: Monitor Anesthesia Care

## 2023-02-09 MED ORDER — LIDOCAINE 2% (20 MG/ML) 5 ML SYRINGE
INTRAMUSCULAR | Status: DC | PRN
  Administered 2023-02-09: 60 mg via INTRAVENOUS

## 2023-02-09 MED ORDER — PROPOFOL 10 MG/ML IV BOLUS
INTRAVENOUS | Status: DC | PRN
  Administered 2023-02-09: 70 mg via INTRAVENOUS

## 2023-02-09 MED ORDER — IOHEXOL 350 MG/ML SOLN
100.0000 mL | Freq: Once | INTRAVENOUS | Status: AC | PRN
  Administered 2023-02-09: 100 mL via INTRAVENOUS

## 2023-02-09 MED ORDER — LACTATED RINGERS IV SOLN: INTRAVENOUS | Status: DC

## 2023-02-09 MED ORDER — PHENYLEPHRINE HCL (PRESSORS) 10 MG/ML IV SOLN
INTRAVENOUS | Status: DC | PRN
  Administered 2023-02-09: 160 ug via INTRAVENOUS

## 2023-02-09 MED ORDER — PROPOFOL 500 MG/50ML IV EMUL
INTRAVENOUS | Status: DC | PRN
  Administered 2023-02-09: 100 ug/kg/min via INTRAVENOUS

## 2023-02-09 SURGICAL SUPPLY — 15 items

## 2023-02-09 NOTE — Progress Notes (Signed)
Clayton Peterson, is a 62 y.o. male, DOB - 1961-05-22, ZOX:[id number] Admit date - 02/06/2023    Outpatient Primary MD for the patient is Del Newman Nip, Tenna Child, FNP  LOS - 2  days    Brief summary    [name] is a 62 y.o. male with medical history significant of CAD s/p stent, AAA s/p repair, DM, HTN, and HLD presenting with chest pain.  He reports prior h/o MI but last cath was in 2012.  Last night, he awoke from sleep about 0200 with severe chest pain.   H/o AAA repair, prior MI with stent.  Trop neg.  ECG unchanged.  CTA neg for dissection and no PE.   Assessment & Plan    Assessment and Plan:  Intermittent chest pain   Appears to have improved.  EKG does not show ischemic changes.  Troponins are negative.  In view of his CAD (s/p DES to [facility]), ruptured AAA s/p repair 2009 , scheduled for cardiac cath, did not show any blockages . He has chronic RCA occlusion with collaterals. He has patent LAD and circumflex.  Meanwhile continue with Aspirin, Metoprolol 50 mg BID.  and crestor 40 mg daily and fenofibrate 160 mg daily. Echocardiogram ordered.  Differential include PUD. GI consulted, as he reports dull epigastric pain .  Last EGD in 8/22 showing gastritis and non bleeding duodenal ulcer.  Pepcid added. Liver panl wnl. Lipase and amylase wnl.  He underwent EGD today showing a single large sub epithelial mass causing extrinsic compression in the gastric fundus possibly due to large pseudocyst of the pancreas noted on CTA. Two non bleeding gastric ulcers with clear ulcer base were found in the gastric fundus along the sub epithelial mass. Biopsies taken for H Pylori testing.   Will need further evaluation with EUS to see if its amenable to drainage.    Hypertension:  BP parameters are optimal.   Insulin dependent DM type 2:  CBG (last 3)  Recent Labs    02/08/23 1707  02/08/23 2119 02/09/23 0724  GLUCAP 217* 108* 114*    Resume Semglee and SSI.  A1c is 8.7%. on 12/2022.     Mild leukocytosis  Resolved.    Stage 3b CKD;  Creatinine stable around 1.5 Continue to monitor.    Mild hyponatremia:  Sodium at 134 and stable.     Estimated body mass index is 29.73 kg/m as calculated from the following:   Height as of this encounter: 5\' 9"  (1.753 m).   Weight as of this encounter: 91.3 kg.  Code Status: full code.  DVT Prophylaxis:  Heparin.   Level of Care: Level of care: Telemetry Cardiac Family Communication: none at bedside.   Disposition Plan:     Remains inpatient appropriate:  pending eval by GI.   Procedures:  Cardiac catheterization.  EGD Consultants:   Cardiology GAstroenterology.   Antimicrobials:   Anti-infectives (From admission, onward)    None        Medications  Scheduled Meds:  aspirin  EC  81 mg Oral Daily   famotidine  20 mg Oral BID   fenofibrate  160 mg Oral Daily   gabapentin  100 mg Oral Daily   insulin aspart  0-15 Units Subcutaneous TID WC   insulin aspart  0-5 Units Subcutaneous QHS   insulin glargine-yfgn  30 Units Subcutaneous Daily   metoprolol tartrate  50 mg Oral BID   nicotine  21 mg Transdermal Daily   omega-3 acid ethyl esters  2 g Oral BID   pantoprazole  40 mg Oral BID AC   rosuvastatin  40 mg Oral Daily   sodium chloride flush  3 mL Intravenous Q12H   sodium chloride flush  3 mL Intravenous Q12H   Continuous Infusions:  sodium chloride     sodium chloride     PRN Meds:.sodium chloride, sodium chloride, acetaminophen, ondansetron (ZOFRAN) IV, sodium chloride flush, sodium chloride flush    Subjective:   [name] was seen and examined today.  NO chest pain. Or sob.    Objective:   Vitals:   02/09/23 0903 02/09/23 0908 02/09/23 0913 02/09/23 0918  BP: 96/65 107/68 122/68 119/69  Pulse: 61 (!) 57 (!) 58 (!) 57  Resp: 16 13 14 12   Temp:      TempSrc:      SpO2:  92% 96% 97% 96%  Weight:      Height:        Intake/Output Summary (Last 24 hours) at 02/09/2023 1038 Last data filed at 02/09/2023 0849 Gross per 24 hour  Intake 200 ml  Output 1300 ml  Net -1100 ml    Filed Weights   02/06/23 1500 02/06/23 2258  Weight: 94.3 kg 91.3 kg     Exam. General exam: Appears calm and comfortable  Respiratory system: Clear to auscultation. Respiratory effort normal. Cardiovascular system: S1 & S2 heard, RRR. No JVD,  Gastrointestinal system: Abdomen is nondistended, soft and nontender.  Central nervous system: Alert and oriented. No focal neurological deficits. Extremities: Symmetric 5 x 5 power. Skin: No rashes,  Psychiatry:  Mood & affect appropriate.        Data Reviewed:  I have personally reviewed following labs and imaging studies   CBC Lab Results  Component Value Date   WBC 8.2 02/09/2023   RBC 4.82 02/09/2023   HGB 13.9 02/09/2023   HCT 41.2 02/09/2023   MCV 85.5 02/09/2023   MCH 28.8 02/09/2023   PLT 146 (L) 02/09/2023   MCHC 33.7 02/09/2023   RDW 14.5 02/09/2023   LYMPHSABS 1.1 12/29/2022   MONOABS 0.9 02/17/2022   EOSABS 0.1 12/29/2022   BASOSABS 0.1 12/29/2022     Last metabolic panel Lab Results  Component Value Date   NA 134 (L) 02/09/2023   K 4.1 02/09/2023   CL 101 02/09/2023   CO2 23 02/09/2023   BUN 20 02/09/2023   CREATININE 1.58 (H) 02/09/2023   GLUCOSE 113 (H) 02/09/2023   GFRNONAA 49 (L) 02/09/2023   CALCIUM 9.1 02/09/2023   PHOS 3.1 02/14/2022   PROT 5.7 (L) 02/08/2023   ALBUMIN 2.7 (L) 02/08/2023   LABGLOB 2.3 12/29/2022   AGRATIO 1.8 12/29/2022   BILITOT 0.5 02/08/2023   ALKPHOS 42 02/08/2023   AST 13 (L) 02/08/2023   ALT 13 02/08/2023   ANIONGAP 10 02/09/2023    CBG (last 3)  Recent Labs    02/08/23 1707 02/08/23 2119 02/09/23 0724  GLUCAP 217* 108* 114*       Coagulation Profile: No results  for input(s): "INR", "PROTIME" in the last 168 hours.   Radiology  Studies: ECHOCARDIOGRAM COMPLETE  Result Date: 02/08/2023    ECHOCARDIOGRAM REPORT   Patient Name:   Clayton Peterson Date of Exam: 02/08/2023 Medical Rec #:  [id number]      Height:       69.0 in Accession #:    [id number]     Weight:       201.3 lb Date of Birth:  1961/06/21       BSA:          2.072 m Patient Age:    62 years       BP:           95/64 mmHg Patient Gender: M              HR:           54 bpm. Exam Location:  Inpatient Procedure: 2D Echo, Cardiac Doppler and Color Doppler Indications:    Chest Pain R07.9  History:        Patient has no prior history of Echocardiogram examinations.                 CAD, Signs/Symptoms:Chest Pain; Risk Factors:Diabetes,                 Hypertension, Sleep Apnea, Dyslipidemia and Current Smoker. CKD,                 stage 3.  Sonographer:    Lucendia Herrlich Referring Phys: [id number] CHRISTOPHER D MCALHANY IMPRESSIONS  1. Left ventricular ejection fraction, by estimation, is 55 to 60%. Left ventricular ejection fraction by PLAX is 58 %. The left ventricle has normal function. The left ventricle has no regional wall motion abnormalities. codominant mitral inflow, suggesting borderline diastolic dysfunciton.  2. Right ventricular systolic function is normal. The right ventricular size is normal. Tricuspid regurgitation signal is inadequate for assessing PA pressure.  3. The mitral valve is grossly normal. Trivial mitral valve regurgitation.  4. The aortic valve is tricuspid. Aortic valve regurgitation is not visualized.  5. The inferior vena cava is normal in size with greater than 50% respiratory variability, suggesting right atrial pressure of 3 mmHg. Comparison(s): No prior Echocardiogram. FINDINGS  Left Ventricle: Left ventricular ejection fraction, by estimation, is 55 to 60%. Left ventricular ejection fraction by PLAX is 58 %. The left ventricle has normal function. The left ventricle has no regional wall motion abnormalities. The left ventricular internal cavity size was  normal in size. There is no left ventricular hypertrophy. Codominant mitral inflow, suggesting borderline diastolic dysfunciton. Right Ventricle: The right ventricular size is normal. No increase in right ventricular wall thickness. Right ventricular systolic function is normal. Tricuspid regurgitation signal is inadequate for assessing PA pressure. Left Atrium: Left atrial size was normal in size. Right Atrium: Right atrial size was normal in size. Pericardium: There is no evidence of pericardial effusion. Mitral Valve: The mitral valve is grossly normal. Trivial mitral valve regurgitation. Tricuspid Valve: The tricuspid valve is grossly normal. Tricuspid valve regurgitation is trivial. Aortic Valve: The aortic valve is tricuspid. Aortic valve regurgitation is not visualized. Aortic valve peak gradient measures 6.5 mmHg. Pulmonic Valve: The pulmonic valve was normal in structure. Pulmonic valve regurgitation is not visualized. Aorta: The aortic root and ascending aorta are structurally normal, with no evidence of dilitation. Venous: The inferior vena cava is normal in size with greater than 50% respiratory variability, suggesting right atrial pressure of 3 mmHg. IAS/Shunts:  No atrial level shunt detected by color flow Doppler.  LEFT VENTRICLE PLAX 2D LV EF:         Left            Diastology                ventricular     LV e' medial:    8.70 cm/s                ejection        LV E/e' medial:  8.0                fraction by     LV e' lateral:   9.48 cm/s                PLAX is 58      LV E/e' lateral: 7.4                %. LVIDd:         4.90 cm LVIDs:         3.40 cm LV PW:         1.10 cm LV IVS:        0.70 cm LVOT diam:     2.10 cm LV SV:         73 LV SV Index:   35 LVOT Area:     3.46 cm  RIGHT VENTRICLE             IVC RV S prime:     12.70 cm/s  IVC diam: 1.50 cm TAPSE (M-mode): 2.1 cm LEFT ATRIUM             Index        RIGHT ATRIUM           Index LA diam:        3.90 cm 1.88 cm/m   RA Area:      20.80 cm LA Vol (A2C):   51.7 ml 24.96 ml/m  RA Volume:   64.10 ml  30.94 ml/m LA Vol (A4C):   45.2 ml 21.82 ml/m LA Biplane Vol: 49.9 ml 24.09 ml/m  AORTIC VALVE AV Area (Vmax): 2.87 cm AV Vmax:        127.00 cm/s AV Peak Grad:   6.5 mmHg LVOT Vmax:      105.33 cm/s LVOT Vmean:     66.000 cm/s LVOT VTI:       0.212 m  AORTA Ao Root diam: 3.50 cm Ao Asc diam:  3.50 cm MITRAL VALVE MV Area (PHT): 4.80 cm    SHUNTS MV Decel Time: 158 msec    Systemic VTI:  0.21 m MV E velocity: 69.90 cm/s  Systemic Diam: 2.10 cm MV A velocity: 62.40 cm/s MV E/A ratio:  1.12 Zoila Shutter MD Electronically signed by Zoila Shutter MD Signature Date/Time: 02/08/2023/12:55:00 PM    Final    CARDIAC CATHETERIZATION  Result Date: 02/07/2023   Prox RCA to Mid RCA lesion is 100% stenosed.   Prox LAD lesion is 40% stenosed.   Prox Cx to Dist Cx lesion is 20% stenosed.   The left ventricular systolic function is normal. Mild proximal LAD stenosis Mild plaque through the mid Circumflex. The large dominant RCA is occluded in the mid stented segment. This appears chronic. Mid and distal RCA fills from left to right collaterals. Normal LV systolic function LVEDP 6 mmHg Recommendations: Non-cardiac chest pain. Medical management of CAD.  Kathlen Mody M.D. [facility] 02/09/2023, 10:38 AM  Available via Epic secure chat 7am-7pm After 7 pm, please refer to night coverage provider listed on amion.

## 2023-02-09 NOTE — Progress Notes (Signed)
GI UPDATE  I discussed this case with Dr. Meridee Score of advanced endoscopy. 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 He will benefit from EUS guided drainage of this. CTA did not completely evaluate the cyst, so recommend CT abdomen to get better imaging of this. If he stays in the hospital, Dr. Meridee Score may be able to help out with EUS guided drainage of the cyst on Monday or Tuesday pending openings in his schedule. I will discuss his case with Dr. Elnoria Howard who is covering this weekend to see if he would have any availability to do this over the weekend. Another option would be IR guided drainage if endoscopic drainage is not available in the near future. Will await his CT scan for now - I think it would be best to keep him in the hospital to have this addressed rather than outpatient given it would take some time to get this scheduled as outpatient.   Dr. Elnoria Howard covering the inpatient service tomorrow.  Harlin Rain, MD [facility]

## 2023-02-09 NOTE — Transfer of Care (Signed)
Immediate Anesthesia Transfer of Care Note  Patient: Media planner  Procedure(s) Performed: ESOPHAGOGASTRODUODENOSCOPY (EGD) WITH PROPOFOL BIOPSY  Patient Location: PACU and Endoscopy Unit  Anesthesia Type:MAC  Level of Consciousness: awake  Airway & Oxygen Therapy: Patient Spontanous Breathing  Post-op Assessment: Report given to RN and Post -op Vital signs reviewed and stable  Post vital signs: Reviewed and stable  Last Vitals:  Vitals Value Taken Time  BP    Temp    Pulse    Resp    SpO2      Last Pain:  Vitals:   02/09/23 0755  TempSrc: Temporal  PainSc: 3       Patients Stated Pain Goal: 0 (02/09/23 0747)  Complications: No notable events documented.

## 2023-02-09 NOTE — Interval H&P Note (Signed)
History and Physical Interval Note: Patient seen yesterday - initially after discussion of EGD with our service he elected to do it as outpatient. He thought more about this and asking it to be done prior to discharge. I offered him an EGD today. I discussed risks / benefits of the exam and anesthesia and he wishes to proceed. Further recommendations pending the results. Otherwise he is stable today and is NPO.   02/09/2023 8:02 AM  Clayton Peterson  has presented today for surgery, with the diagnosis of atypical chest pain, epigastric pain.  The various methods of treatment have been discussed with the patient and family. After consideration of risks, benefits and other options for treatment, the patient has consented to  Procedure(s): ESOPHAGOGASTRODUODENOSCOPY (EGD) WITH PROPOFOL (N/A) as a surgical intervention.  The patient's history has been reviewed, patient examined, no change in status, stable for surgery.  I have reviewed the patient's chart and labs.  Questions were answered to the patient's satisfaction.     Viviann Spare P Kayse Puccini

## 2023-02-09 NOTE — Op Note (Signed)
[facility] Patient Name: Clayton Peterson Procedure Date : 02/09/2023 MRN: [id number] Attending MD: Willaim Rayas. Adela Lank , MD, [id number] Date of Birth: 01-Aug-1961 CSN: [id number] Age: 62 Admit Type: Inpatient Procedure:                Upper GI endoscopy Indications:              Epigastric abdominal pain, Chest pain (non cardiac)                            - negative cardiac workup, CTA shows enlarging                            pseudocyst in the pancreatic tail / body causing                            mass effect on the stomach. Providers:                Willaim Rayas. Adela Lank, MD, Adolph Pollack, RN,                            Priscella Mann, Technician Referring MD:              Medicines:                Monitored Anesthesia Care Complications:            No immediate complications. Estimated blood loss:                            Minimal. Estimated Blood Loss:     Estimated blood loss was minimal. Procedure:                Pre-Anesthesia Assessment:                           - Prior to the procedure, a History and Physical                            was performed, and patient medications and                            allergies were reviewed. The patient's tolerance of                            previous anesthesia was also reviewed. The risks                            and benefits of the procedure and the sedation                            options and risks were discussed with the patient.                            All questions were answered, and informed consent  was obtained. Prior Anticoagulants: The patient has                            taken no anticoagulant or antiplatelet agents. ASA                            Grade Assessment: III - A patient with severe                            systemic disease. After reviewing the risks and                            benefits, the patient was deemed in satisfactory                             condition to undergo the procedure.                           After obtaining informed consent, the endoscope was                            passed under direct vision. Throughout the                            procedure, the patient's blood pressure, pulse, and                            oxygen saturations were monitored continuously. The                            [id number]-[id number] ([id number] Olympus endoscope was introduced                            through the mouth, and advanced to the second part                            of duodenum. The upper GI endoscopy was                            accomplished without difficulty. The patient                            tolerated the procedure well. Scope In: Scope Out: Findings:      Esophagogastric landmarks were identified: the Z-line was found at 43 cm       from the incisors.      A small hiatal hernia was present.      The exam of the esophagus was otherwise normal.      A single large subepithelial mass causing extrinsic compression was       found in the gastric fundus with a purplish hue - likely due to large       pseudocyst of the pancreas noted noted on CTA. Retroflexed views       distorted and limited in cardia due to this lesion  Two non-bleeding gastric ulcers with a clean ulcer base (Forrest Class       III) were found in the gastric fundus along subepithelial mass. The       largest lesion was 4 mm in largest dimension. No active bleeding.      The exam of the stomach was otherwise normal.      Biopsies were taken with a cold forceps for Helicobacter pylori testing       from the antrum and body.      The examined duodenum was normal. Impression:               - Esophagogastric landmarks identified.                           - Small hiatal hernia.                           - Normal esophagus otherwise.                           - A single subepithelial mass found in the gastric                            fundus associated with  2 non-bleeding gastric                            ulcers with a clean ulcer base (Forrest Class III).                           - Normal stomach otherwise - biopsies taken to rule                            out H pylori                           - Normal examined duodenum.                           Suspect subepithelial lesion with ulcerations in                            fundus / cardia is cause of his symptoms and likely                            represents enlarging pancreatic pseudocyst noted on                            recent CTA. I will need to discuss his case with my                            advanced endoscopy colleagues to see if this may be                            amenable to EUS guided drainage. Recommendation:           - Return patient to hospital ward for  ongoing care.                           - Advance diet as tolerated                           - Continue present medications.                           - Protonix  twice daily                           - Carafate 10cc every 6 hours PRN                           - Await pathology results.                           - Will discuss his case with my advanced endoscopy                            colleagues with further recommendations regarding                            possible EUS guided drainage. Procedure Code(s):        --- Professional ---                           [id number], Esophagogastroduodenoscopy, flexible,                            transoral; with biopsy, single or multiple Diagnosis Code(s):        --- Professional ---                           K44.9, Diaphragmatic hernia without obstruction or                            gangrene                           K31.89, Other diseases of stomach and duodenum                           K25.9, Gastric ulcer, unspecified as acute or                            chronic, without hemorrhage or perforation                           R10.13, Epigastric pain                            R07.89, Other chest pain CPT copyright 2022 [employer]. All rights reserved. The codes documented in this report are preliminary and upon coder review may  be revised to meet current compliance requirements. Viviann Spare P. Nelvin Tomb, MD 02/09/2023 9:06:00 AM This report has been  signed electronically. Number of Addenda: 0

## 2023-02-09 NOTE — Anesthesia Preprocedure Evaluation (Addendum)
Anesthesia Evaluation  Patient identified by MRN, date of birth, ID band Patient awake    Reviewed: Allergy & Precautions, NPO status , Patient's Chart, lab work & pertinent test results, reviewed documented beta blocker date and time   Airway Mallampati: II  TM Distance: >3 FB Neck ROM: Full    Dental  (+) Missing, Loose, Dental Advisory Given, Poor Dentition   Pulmonary sleep apnea , Current Smoker and Patient abstained from smoking.   Pulmonary exam normal breath sounds clear to auscultation       Cardiovascular hypertension, Pt. on medications and Pt. on home beta blockers + CAD and + Past MI  Normal cardiovascular exam Rhythm:Regular Rate:Normal  Echo 01/2023  1. Left ventricular ejection fraction, by estimation, is 55 to 60%. Left ventricular ejection fraction by PLAX is 58 %. The left ventricle has normal function. The left ventricle has no regional wall motion abnormalities. codominant mitral inflow, suggesting borderline diastolic dysfunciton.   2. Right ventricular systolic function is normal. The right ventricular size is normal. Tricuspid regurgitation signal is inadequate for assessing PA pressure.   3. The mitral valve is grossly normal. Trivial mitral valve regurgitation.   4. The aortic valve is tricuspid. Aortic valve regurgitation is not visualized.   5. The inferior vena cava is normal in size with greater than 50% respiratory variability, suggesting right atrial pressure of 3 mmHg.   Comparison(s): No prior Echocardiogram.    LHC 01/2023  Prox RCA to Mid RCA lesion is 100% stenosed.   Prox LAD lesion is 40% stenosed.   Prox Cx to Dist Cx lesion is 20% stenosed.   The left ventricular systolic function is normal.   Mild proximal LAD stenosis Mild plaque through the mid Circumflex.  The large dominant RCA is occluded in the mid stented segment. This appears chronic. Mid and distal RCA fills from left to right  collaterals.  Normal LV systolic function LVEDP 6 mmHg   Recommendations: Non-cardiac chest pain. Medical management of CAD.       Neuro/Psych  negative psych ROS   GI/Hepatic Neg liver ROS,GERD  ,,  Endo/Other  diabetes    Renal/GU Renal disease     Musculoskeletal negative musculoskeletal ROS (+)    Abdominal   Peds  Hematology negative hematology ROS (+)   Anesthesia Other Findings   Reproductive/Obstetrics                             Anesthesia Physical Anesthesia Plan  ASA: 3  Anesthesia Plan: MAC   Post-op Pain Management: Minimal or no pain anticipated   Induction: Intravenous  PONV Risk Score and Plan: 0 and TIVA, Propofol infusion, Treatment may vary due to age or medical condition and Ondansetron  Airway Management Planned: Natural Airway  Additional Equipment:   Intra-op Plan:   Post-operative Plan:   Informed Consent: I have reviewed the patients History and Physical, chart, labs and discussed the procedure including the risks, benefits and alternatives for the proposed anesthesia with the patient or authorized representative who has indicated his/her understanding and acceptance.     Dental advisory given  Plan Discussed with: CRNA  Anesthesia Plan Comments:         Anesthesia Quick Evaluation

## 2023-02-10 DIAGNOSIS — R079 Chest pain, unspecified: Secondary | ICD-10-CM | POA: Diagnosis not present

## 2023-02-10 DIAGNOSIS — E1159 Type 2 diabetes mellitus with other circulatory complications: Secondary | ICD-10-CM | POA: Diagnosis not present

## 2023-02-10 DIAGNOSIS — I714 Abdominal aortic aneurysm, without rupture, unspecified: Secondary | ICD-10-CM | POA: Diagnosis not present

## 2023-02-10 DIAGNOSIS — I10 Essential (primary) hypertension: Secondary | ICD-10-CM | POA: Diagnosis not present

## 2023-02-10 LAB — GLUCOSE, CAPILLARY
Glucose-Capillary: 149 mg/dL — ABNORMAL HIGH (ref 70–99)
Glucose-Capillary: 125 mg/dL — ABNORMAL HIGH (ref 70–99)
Glucose-Capillary: 208 mg/dL — ABNORMAL HIGH (ref 70–99)
Glucose-Capillary: 153 mg/dL — ABNORMAL HIGH (ref 70–99)
Glucose-Capillary: 222 mg/dL — ABNORMAL HIGH (ref 70–99)

## 2023-02-10 LAB — CBC
HCT: 41.7 % (ref 39.0–52.0)
Hemoglobin: 14.1 g/dL (ref 13.0–17.0)
MCH: 28.8 pg (ref 26.0–34.0)
MCHC: 33.8 g/dL (ref 30.0–36.0)
MCV: 85.1 fL (ref 80.0–100.0)
Platelets: 154 10*3/uL (ref 150–400)
RBC: 4.9 MIL/uL (ref 4.22–5.81)
RDW: 14.4 % (ref 11.5–15.5)
WBC: 7.1 10*3/uL (ref 4.0–10.5)
nRBC: 0 % (ref 0.0–0.2)

## 2023-02-10 LAB — BASIC METABOLIC PANEL
Anion gap: 10 (ref 5–15)
BUN: 22 mg/dL (ref 8–23)
CO2: 22 mmol/L (ref 22–32)
Calcium: 9 mg/dL (ref 8.9–10.3)
Chloride: 99 mmol/L (ref 98–111)
Creatinine, Ser: 1.73 mg/dL — ABNORMAL HIGH (ref 0.61–1.24)
GFR, Estimated: 44 mL/min — ABNORMAL LOW (ref >60)
Glucose, Bld: 119 mg/dL — ABNORMAL HIGH (ref 70–99)
Potassium: 3.9 mmol/L (ref 3.5–5.1)
Sodium: 131 mmol/L — ABNORMAL LOW (ref 135–145)

## 2023-02-10 MED ORDER — SODIUM CHLORIDE 0.9 % IV SOLN: INTRAVENOUS | Status: DC

## 2023-02-10 NOTE — Progress Notes (Signed)
Patient refused CPAP HS tonight. Patient in no distress at this time. 

## 2023-02-10 NOTE — Progress Notes (Signed)
Subjective: No complaints.  No acute events.  Objective: Vital signs in last 24 hours: Temp:  [97.8 F (36.6 C)-99 F (37.2 C)] 97.8 F (36.6 C) (04/20 0541) Pulse Rate:  [51-70] 51 (04/20 0541) Resp:  [16] 16 (04/20 0541) BP: (108-137)/(66-74) 137/74 (04/20 0541) SpO2:  [93 %-95 %] 93 % (04/20 0541) Last BM Date : 02/08/23  Intake/Output from previous day: 04/19 0701 - 04/20 0700 In: 200 [I.V.:200] Out: -  Intake/Output this shift: No intake/output data recorded.  General appearance: alert and no distress GI: mild upper abdominal tenderness  Lab Results: Recent Labs    02/08/23 0202 02/09/23 0244 02/10/23 0230  WBC 7.0 8.2 7.1  HGB 13.4 13.9 14.1  HCT 39.5 41.2 41.7  PLT 144* 146* 154   BMET Recent Labs    02/08/23 0202 02/09/23 0244 02/10/23 0230  NA 134* 134* 131*  K 3.9 4.1 3.9  CL 103 101 99  CO2 22 23 22   GLUCOSE 89 113* 119*  BUN 19 20 22   CREATININE 1.52* 1.58* 1.73*  CALCIUM 8.6* 9.1 9.0   LFT Recent Labs    02/08/23 0202  PROT 5.7*  ALBUMIN 2.7*  AST 13*  ALT 13  ALKPHOS 42  BILITOT 0.5  BILIDIR <0.1  IBILI NOT CALCULATED   PT/INR No results for input(s): "LABPROT", "INR" in the last 72 hours. Hepatitis Panel No results for input(s): "HEPBSAG", "HCVAB", "HEPAIGM", "HEPBIGM" in the last 72 hours. C-Diff No results for input(s): "CDIFFTOX" in the last 72 hours. Fecal Lactopherrin No results for input(s): "FECLLACTOFRN" in the last 72 hours.  Studies/Results: ECHOCARDIOGRAM COMPLETE  Result Date: 02/08/2023    ECHOCARDIOGRAM REPORT   Patient Name:   Clayton Peterson Date of Exam: 02/08/2023 Medical Rec #:  [id number]      Height:       69.0 in Accession #:    [id number]     Weight:       201.3 lb Date of Birth:  [birth year]       BSA:          2.072 m Patient Age:    62 years       BP:           95/64 mmHg Patient Gender: M              HR:           54 bpm. Exam Location:  Inpatient Procedure: 2D Echo, Cardiac Doppler and Color Doppler  Indications:    Chest Pain R07.9  History:        Patient has no prior history of Echocardiogram examinations.                 CAD, Signs/Symptoms:Chest Pain; Risk Factors:Diabetes,                 Hypertension, Sleep Apnea, Dyslipidemia and Current Smoker. CKD,                 stage 3.  Sonographer:    Lucendia Herrlich Referring Phys: [id number] CHRISTOPHER D MCALHANY IMPRESSIONS  1. Left ventricular ejection fraction, by estimation, is 55 to 60%. Left ventricular ejection fraction by PLAX is 58 %. The left ventricle has normal function. The left ventricle has no regional wall motion abnormalities. codominant mitral inflow, suggesting borderline diastolic dysfunciton.  2. Right ventricular systolic function is normal. The right ventricular size is normal. Tricuspid regurgitation signal is inadequate for assessing PA pressure.  3. The mitral valve is grossly  normal. Trivial mitral valve regurgitation.  4. The aortic valve is tricuspid. Aortic valve regurgitation is not visualized.  5. The inferior vena cava is normal in size with greater than 50% respiratory variability, suggesting right atrial pressure of 3 mmHg. Comparison(s): No prior Echocardiogram. FINDINGS  Left Ventricle: Left ventricular ejection fraction, by estimation, is 55 to 60%. Left ventricular ejection fraction by PLAX is 58 %. The left ventricle has normal function. The left ventricle has no regional wall motion abnormalities. The left ventricular internal cavity size was normal in size. There is no left ventricular hypertrophy. Codominant mitral inflow, suggesting borderline diastolic dysfunciton. Right Ventricle: The right ventricular size is normal. No increase in right ventricular wall thickness. Right ventricular systolic function is normal. Tricuspid regurgitation signal is inadequate for assessing PA pressure. Left Atrium: Left atrial size was normal in size. Right Atrium: Right atrial size was normal in size. Pericardium: There is no evidence of  pericardial effusion. Mitral Valve: The mitral valve is grossly normal. Trivial mitral valve regurgitation. Tricuspid Valve: The tricuspid valve is grossly normal. Tricuspid valve regurgitation is trivial. Aortic Valve: The aortic valve is tricuspid. Aortic valve regurgitation is not visualized. Aortic valve peak gradient measures 6.5 mmHg. Pulmonic Valve: The pulmonic valve was normal in structure. Pulmonic valve regurgitation is not visualized. Aorta: The aortic root and ascending aorta are structurally normal, with no evidence of dilitation. Venous: The inferior vena cava is normal in size with greater than 50% respiratory variability, suggesting right atrial pressure of 3 mmHg. IAS/Shunts: No atrial level shunt detected by color flow Doppler.  LEFT VENTRICLE PLAX 2D LV EF:         Left            Diastology                ventricular     LV e' medial:    8.70 cm/s                ejection        LV E/e' medial:  8.0                fraction by     LV e' lateral:   9.48 cm/s                PLAX is 58      LV E/e' lateral: 7.4                %. LVIDd:         4.90 cm LVIDs:         3.40 cm LV PW:         1.10 cm LV IVS:        0.70 cm LVOT diam:     2.10 cm LV SV:         73 LV SV Index:   35 LVOT Area:     3.46 cm  RIGHT VENTRICLE             IVC RV S prime:     12.70 cm/s  IVC diam: 1.50 cm TAPSE (M-mode): 2.1 cm LEFT ATRIUM             Index        RIGHT ATRIUM           Index LA diam:        3.90 cm 1.88 cm/m   RA Area:     20.80 cm LA Vol (A2C):  51.7 ml 24.96 ml/m  RA Volume:   64.10 ml  30.94 ml/m LA Vol (A4C):   45.2 ml 21.82 ml/m LA Biplane Vol: 49.9 ml 24.09 ml/m  AORTIC VALVE AV Area (Vmax): 2.87 cm AV Vmax:        127.00 cm/s AV Peak Grad:   6.5 mmHg LVOT Vmax:      105.33 cm/s LVOT Vmean:     66.000 cm/s LVOT VTI:       0.212 m  AORTA Ao Root diam: 3.50 cm Ao Asc diam:  3.50 cm MITRAL VALVE MV Area (PHT): 4.80 cm    SHUNTS MV Decel Time: 158 msec    Systemic VTI:  0.21 m MV E velocity: 69.90  cm/s  Systemic Diam: 2.10 cm MV A velocity: 62.40 cm/s MV E/A ratio:  1.12 Zoila Shutter MD Electronically signed by Zoila Shutter MD Signature Date/Time: 02/08/2023/12:55:00 PM    Final     Medications: Scheduled:  aspirin EC  81 mg Oral Daily   famotidine  20 mg Oral BID   fenofibrate  160 mg Oral Daily   gabapentin  100 mg Oral Daily   insulin aspart  0-15 Units Subcutaneous TID WC   insulin aspart  0-5 Units Subcutaneous QHS   insulin glargine-yfgn  30 Units Subcutaneous Daily   metoprolol tartrate  50 mg Oral BID   nicotine  21 mg Transdermal Daily   omega-3 acid ethyl esters  2 g Oral BID   pantoprazole  40 mg Oral BID AC   rosuvastatin  40 mg Oral Daily   sodium chloride flush  3 mL Intravenous Q12H   sodium chloride flush  3 mL Intravenous Q12H   Continuous:  sodium chloride     sodium chloride      Assessment/Plan: 1) 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 2) Recurrent acute pancreatitis.   The imaging was reviewed.  He has a large pseudocyst that is causing an extrinsic compression.  He is not able to eat.  The pseudocyst enlarged compared to February and there appears to be a mature wall.  Plan: 1) EUS with FNA to drain the pseudocyst tomorrow.  LOS: 3 days   Anusha Claus D 02/10/2023, 9:18 AM

## 2023-02-10 NOTE — H&P (View-Only) (Signed)
Subjective: No complaints.  No acute events.  Objective: Vital signs in last 24 hours: Temp:  [97.8 F (36.6 C)-99 F (37.2 C)] 97.8 F (36.6 C) (04/20 0541) Pulse Rate:  [51-70] 51 (04/20 0541) Resp:  [16] 16 (04/20 0541) BP: (108-137)/(66-74) 137/74 (04/20 0541) SpO2:  [93 %-95 %] 93 % (04/20 0541) Last BM Date : 02/08/23  Intake/Output from previous day: 04/19 0701 - 04/20 0700 In: 200 [I.V.:200] Out: -  Intake/Output this shift: No intake/output data recorded.  General appearance: alert and no distress GI: mild upper abdominal tenderness  Lab Results: Recent Labs    02/08/23 0202 02/09/23 0244 02/10/23 0230  WBC 7.0 8.2 7.1  HGB 13.4 13.9 14.1  HCT 39.5 41.2 41.7  PLT 144* 146* 154   BMET Recent Labs    02/08/23 0202 02/09/23 0244 02/10/23 0230  NA 134* 134* 131*  K 3.9 4.1 3.9  CL 103 101 99  CO2 22 23 22  GLUCOSE 89 113* 119*  BUN 19 20 22  CREATININE 1.52* 1.58* 1.73*  CALCIUM 8.6* 9.1 9.0   LFT Recent Labs    02/08/23 0202  PROT 5.7*  ALBUMIN 2.7*  AST 13*  ALT 13  ALKPHOS 42  BILITOT 0.5  BILIDIR <0.1  IBILI NOT CALCULATED   PT/INR No results for input(s): "LABPROT", "INR" in the last 72 hours. Hepatitis Panel No results for input(s): "HEPBSAG", "HCVAB", "HEPAIGM", "HEPBIGM" in the last 72 hours. C-Diff No results for input(s): "CDIFFTOX" in the last 72 hours. Fecal Lactopherrin No results for input(s): "FECLLACTOFRN" in the last 72 hours.  Studies/Results: ECHOCARDIOGRAM COMPLETE  Result Date: 02/08/2023    ECHOCARDIOGRAM REPORT   Patient Name:   Clayton Peterson Date of Exam: 02/08/2023 Medical Rec #:  [id number]      Height:       69.0 in Accession #:    [id number]     Weight:       201.3 lb Date of Birth:  01/02/1961       BSA:          2.072 m Patient Age:    62 years       BP:           95/64 mmHg Patient Gender: M              HR:           54 bpm. Exam Location:  Inpatient Procedure: 2D Echo, Cardiac Doppler and Color Doppler  Indications:    Chest Pain R07.9  History:        Patient has no prior history of Echocardiogram examinations.                 CAD, Signs/Symptoms:Chest Pain; Risk Factors:Diabetes,                 Hypertension, Sleep Apnea, Dyslipidemia and Current Smoker. CKD,                 stage 3.  Sonographer:    Shanika Turnbull Referring Phys: [id number] CHRISTOPHER D MCALHANY IMPRESSIONS  1. Left ventricular ejection fraction, by estimation, is 55 to 60%. Left ventricular ejection fraction by PLAX is 58 %. The left ventricle has normal function. The left ventricle has no regional wall motion abnormalities. codominant mitral inflow, suggesting borderline diastolic dysfunciton.  2. Right ventricular systolic function is normal. The right ventricular size is normal. Tricuspid regurgitation signal is inadequate for assessing PA pressure.  3. The mitral valve is grossly   normal. Trivial mitral valve regurgitation.  4. The aortic valve is tricuspid. Aortic valve regurgitation is not visualized.  5. The inferior vena cava is normal in size with greater than 50% respiratory variability, suggesting right atrial pressure of 3 mmHg. Comparison(s): No prior Echocardiogram. FINDINGS  Left Ventricle: Left ventricular ejection fraction, by estimation, is 55 to 60%. Left ventricular ejection fraction by PLAX is 58 %. The left ventricle has normal function. The left ventricle has no regional wall motion abnormalities. The left ventricular internal cavity size was normal in size. There is no left ventricular hypertrophy. Codominant mitral inflow, suggesting borderline diastolic dysfunciton. Right Ventricle: The right ventricular size is normal. No increase in right ventricular wall thickness. Right ventricular systolic function is normal. Tricuspid regurgitation signal is inadequate for assessing PA pressure. Left Atrium: Left atrial size was normal in size. Right Atrium: Right atrial size was normal in size. Pericardium: There is no evidence of  pericardial effusion. Mitral Valve: The mitral valve is grossly normal. Trivial mitral valve regurgitation. Tricuspid Valve: The tricuspid valve is grossly normal. Tricuspid valve regurgitation is trivial. Aortic Valve: The aortic valve is tricuspid. Aortic valve regurgitation is not visualized. Aortic valve peak gradient measures 6.5 mmHg. Pulmonic Valve: The pulmonic valve was normal in structure. Pulmonic valve regurgitation is not visualized. Aorta: The aortic root and ascending aorta are structurally normal, with no evidence of dilitation. Venous: The inferior vena cava is normal in size with greater than 50% respiratory variability, suggesting right atrial pressure of 3 mmHg. IAS/Shunts: No atrial level shunt detected by color flow Doppler.  LEFT VENTRICLE PLAX 2D LV EF:         Left            Diastology                ventricular     LV e' medial:    8.70 cm/s                ejection        LV E/e' medial:  8.0                fraction by     LV e' lateral:   9.48 cm/s                PLAX is 58      LV E/e' lateral: 7.4                %. LVIDd:         4.90 cm LVIDs:         3.40 cm LV PW:         1.10 cm LV IVS:        0.70 cm LVOT diam:     2.10 cm LV SV:         73 LV SV Index:   35 LVOT Area:     3.46 cm  RIGHT VENTRICLE             IVC RV S prime:     12.70 cm/s  IVC diam: 1.50 cm TAPSE (M-mode): 2.1 cm LEFT ATRIUM             Index        RIGHT ATRIUM           Index LA diam:        3.90 cm 1.88 cm/m   RA Area:     20.80 cm LA Vol (A2C):     51.7 ml 24.96 ml/m  RA Volume:   64.10 ml  30.94 ml/m LA Vol (A4C):   45.2 ml 21.82 ml/m LA Biplane Vol: 49.9 ml 24.09 ml/m  AORTIC VALVE AV Area (Vmax): 2.87 cm AV Vmax:        127.00 cm/s AV Peak Grad:   6.5 mmHg LVOT Vmax:      105.33 cm/s LVOT Vmean:     66.000 cm/s LVOT VTI:       0.212 m  AORTA Ao Root diam: 3.50 cm Ao Asc diam:  3.50 cm MITRAL VALVE MV Area (PHT): 4.80 cm    SHUNTS MV Decel Time: 158 msec    Systemic VTI:  0.21 m MV E velocity: 69.90  cm/s  Systemic Diam: 2.10 cm MV A velocity: 62.40 cm/s MV E/A ratio:  1.12 Kenneth Hilty MD Electronically signed by Kenneth Hilty MD Signature Date/Time: 02/08/2023/12:55:00 PM    Final     Medications: Scheduled:  aspirin EC  81 mg Oral Daily   famotidine  20 mg Oral BID   fenofibrate  160 mg Oral Daily   gabapentin  100 mg Oral Daily   insulin aspart  0-15 Units Subcutaneous TID WC   insulin aspart  0-5 Units Subcutaneous QHS   insulin glargine-yfgn  30 Units Subcutaneous Daily   metoprolol tartrate  50 mg Oral BID   nicotine  21 mg Transdermal Daily   omega-3 acid ethyl esters  2 g Oral BID   pantoprazole  40 mg Oral BID AC   rosuvastatin  40 mg Oral Daily   sodium chloride flush  3 mL Intravenous Q12H   sodium chloride flush  3 mL Intravenous Q12H   Continuous:  sodium chloride     sodium chloride      Assessment/Plan: 1) 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 2) Recurrent acute pancreatitis.   The imaging was reviewed.  He has a large pseudocyst that is causing an extrinsic compression.  He is not able to eat.  The pseudocyst enlarged compared to February and there appears to be a mature wall.  Plan: 1) EUS with FNA to drain the pseudocyst tomorrow.  LOS: 3 days   Chantavia Bazzle D 02/10/2023, 9:18 AM 

## 2023-02-10 NOTE — Progress Notes (Signed)
[name], is a 62 y.o. male, DOB - May 08, 1961, [id number] Admit date - 02/06/2023    Outpatient Primary MD for the patient is Del Newman Nip, Tenna Child, FNP  LOS - 3  days    Brief summary    Clayton Peterson is a 62 y.o. male with medical history significant of CAD s/p stent, AAA s/p repair, DM, HTN, and HLD presenting with chest pain.  He reports prior h/o MI but last cath was in 2012.  Last night, he awoke from sleep about 0200 with severe chest pain.   H/o AAA repair, prior MI with stent.  Trop neg.  ECG unchanged.  CTA neg for dissection and no PE.   Assessment & Plan    Assessment and Plan:  Intermittent chest pain   Resolved.  EKG does not show ischemic changes.  Troponins are negative.  In view of his CAD (s/p DES to [facility]), ruptured AAA s/p repair 2009 , underwent cardiac cath, did not show any blockages . He has chronic RCA occlusion with collaterals. He has patent LAD and circumflex.  Meanwhile continue with Aspirin, Metoprolol 50 mg BID.  and crestor 40 mg daily and fenofibrate 160 mg daily. Echocardiogram ordered and reviewed with the patient.    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  Last EGD in 8/22 showing gastritis and non bleeding duodenal ulcer.  Pepcid added. Liver panl wnl. Lipase and amylase wnl.  He underwent EGD  on 4/19 showing a single large sub epithelial mass causing extrinsic compression in the gastric fundus possibly due to large pseudocyst of the pancreas noted on CTA. Two non bleeding gastric ulcers with clear ulcer base were found in the gastric fundus along the sub epithelial mass. Biopsies taken for H Pylori testing.  He underwent Pancreas CT on 4/19  showing Considerable interval enlargement of chronic pancreatic pseudocyst in the distal body  and tail of the pancreas, extending into the retroperitoneal area and  extension into the gastrosplenic ligament making broad contact with the undersurface of the body of the stomach.  Will need further evaluation with EUS to see if its amenable to drainage, possibly in the next 24 to 48  hours.   Hypertension:  BP parameters are well controlled.   Insulin dependent DM type 2:  CBG (last 3)  Recent Labs    02/09/23 2145 02/10/23 0749 02/10/23 1137  GLUCAP 202* 149* 125*    Resume Semglee and SSI.  A1c is 8.7%. on 12/2022.     Mild leukocytosis  Resolved.    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 gently hydrate and repeat renal parameters inam.  Continue to monitor.    Mild hyponatremia:  Monitor.     Estimated body mass index is 29.73 kg/m as calculated from the following:   Height as of this encounter: 5\' 9"  (1.753 m).   Weight as of this encounter: 91.3 kg.  Code  Status: full code.  DVT Prophylaxis:  Heparin.   Level of Care: Level of care: Telemetry Cardiac Family Communication: none at bedside.   Disposition Plan:     Remains inpatient appropriate:  pending eval by GI.   Procedures:  Cardiac catheterization.  EGD Consultants:   Cardiology GAstroenterology.   Antimicrobials:   Anti-infectives (From admission, onward)    None        Medications  Scheduled Meds:  aspirin EC  81 mg Oral Daily   famotidine  20 mg Oral BID   fenofibrate  160 mg Oral Daily   gabapentin  100 mg Oral Daily   insulin aspart  0-15 Units Subcutaneous TID WC   insulin aspart  0-5 Units Subcutaneous QHS   insulin glargine-yfgn  30 Units Subcutaneous Daily   metoprolol tartrate  50 mg Oral BID   nicotine  21 mg Transdermal Daily   omega-3 acid ethyl esters  2 g Oral BID   pantoprazole  40 mg Oral BID AC   rosuvastatin  40 mg Oral Daily   sodium chloride flush  3 mL Intravenous Q12H   sodium chloride flush  3 mL Intravenous Q12H    Continuous Infusions:  sodium chloride     sodium chloride     PRN Meds:.sodium chloride, sodium chloride, acetaminophen, ondansetron (ZOFRAN) IV, sodium chloride flush, sodium chloride flush    Subjective:   Clayton Peterson was seen and examined today.  No new complaints.    Objective:   Vitals:   02/09/23 0918 02/09/23 1946 02/09/23 2156 02/10/23 0541  BP: 119/69 133/74 108/66 137/74  Pulse: (!) 57 69 70 (!) 51  Resp: 12 16  16   Temp:  99 F (37.2 C)  97.8 F (36.6 C)  TempSrc:  Oral  Oral  SpO2: 96% 95%  93%  Weight:      Height:       No intake or output data in the 24 hours ending 02/10/23 1224  Filed Weights   02/06/23 1500 02/06/23 2258  Weight: 94.3 kg 91.3 kg     Exam. General exam: Appears calm and comfortable  Respiratory system: Clear to auscultation. Respiratory effort normal. Cardiovascular system: S1 & S2 heard, RRR. No JVD,  No pedal edema. Gastrointestinal system: Abdomen is nondistended, soft and nontender.  Central nervous system: Alert and oriented. No focal neurological deficits. Extremities: Symmetric 5 x 5 power. Skin: No rashes,  Psychiatry:  Mood & affect appropriate.         Data Reviewed:  I have personally reviewed following labs and imaging studies   CBC Lab Results  Component Value Date   WBC 7.1 02/10/2023   RBC 4.90 02/10/2023   HGB 14.1 02/10/2023   HCT 41.7 02/10/2023   MCV 85.1 02/10/2023   MCH 28.8 02/10/2023   PLT 154 02/10/2023   MCHC 33.8 02/10/2023   RDW 14.4 02/10/2023   LYMPHSABS 1.1 12/29/2022   MONOABS 0.9 02/17/2022   EOSABS 0.1 12/29/2022   BASOSABS 0.1 12/29/2022     Last metabolic panel Lab Results  Component Value Date   NA 131 (L) 02/10/2023   K 3.9 02/10/2023   CL 99 02/10/2023   CO2 22 02/10/2023   BUN 22 02/10/2023   CREATININE 1.73 (H) 02/10/2023   GLUCOSE 119 (H) 02/10/2023   GFRNONAA 44 (L) 02/10/2023   CALCIUM 9.0 02/10/2023   PHOS 3.1 02/14/2022   PROT 5.7 (L) 02/08/2023    ALBUMIN 2.7 (L) 02/08/2023   LABGLOB 2.3 12/29/2022  AGRATIO 1.8 12/29/2022   BILITOT 0.5 02/08/2023   ALKPHOS 42 02/08/2023   AST 13 (L) 02/08/2023   ALT 13 02/08/2023   ANIONGAP 10 02/10/2023    CBG (last 3)  Recent Labs    02/09/23 2145 02/10/23 0749 02/10/23 1137  GLUCAP 202* 149* 125*       Coagulation Profile: No results for input(s): "INR", "PROTIME" in the last 168 hours.   Radiology Studies: CT PANCREAS ABDOMEN W WO CONTRAST  Result Date: 02/10/2023 CLINICAL DATA:  62 year old male with history of pancreatitis with suspected pancreatic pseudocyst. EXAM: CT ABDOMEN WITHOUT AND WITH CONTRAST TECHNIQUE: Multidetector CT imaging of the abdomen was performed following the standard protocol before and following the bolus administration of intravenous contrast. RADIATION DOSE REDUCTION: This exam was performed according to the departmental dose-optimization program which includes automated exposure control, adjustment of the mA and/or kV according to patient size and/or use of iterative reconstruction technique. CONTRAST:  OMNIPAQUE IOHEXOL 350 MG/ML SOLN COMPARISON:  CT of the abdomen and pelvis 12/12/2022. Chest CTA 02/06/2023. FINDINGS: Lower chest: Patchy areas of mild ground-glass attenuation noted in the left lower lobe, new compared to the recent chest CT, likely of infectious or inflammatory etiology. Atherosclerotic calcifications in the distal descending thoracic aorta as well as the right coronary artery. Hepatobiliary: Diffuse low attenuation throughout the hepatic parenchyma, indicative of a background of hepatic steatosis. Liver has a slightly shrunken appearance and nodular contour, indicative of underlying cirrhosis. No suspicious cystic or solid hepatic lesions. Gallbladder is unremarkable in appearance. Pancreas: Previously noted complex lesion in the distal body and tail of the pancreas has substantially increased in size compared to the prior study (axial  image 26 of series 10 and coronal image 53 of series 14) currently measuring 9.5 x 6.8 x 12.3 cm. This lesion extends cephalad from the pancreas into the gastrosplenic ligament making broad contact with the undersurface of the body of the stomach, and extends caudally in the adjacent regions of the retroperitoneum. This lesion demonstrates peripheral rim enhancement, but is otherwise relatively simple in appearance with internal low-attenuation. Head, uncinate process and proximal body of the pancreas are otherwise normal in appearance. Spleen: Small splenules medial to the spleen. Otherwise, unremarkable. Adrenals/Urinary Tract: Moderate atrophy of the right kidney. Mild multifocal cortical thinning in the left kidney. Nonobstructive calculi in the left renal collecting system measuring up to 6 mm. No suspicious renal lesions. No hydroureteronephrosis in the visualized portions of the abdomen. Bilateral adrenal glands are normal in appearance. Stomach/Bowel: Stomach is distorted by the large pancreatic pseudocyst (described above). No pathologic dilatation of visualized small bowel or colon. Vascular/Lymphatic: Aortic atherosclerosis with aneurysmal dilatation of the abdominal aorta, most severe in the immediate infrarenal region where it the vessel measures up to 4.7 x 4.2 cm (previously 4.6 x 3.9 cm), where there is a large amount of eccentric nonenhancing material, compatible with atheromatous plaque and/or mural thrombus. Postoperative changes are noted from prior aorta iliac graft repair, partially imaged, with patency of the lumen. Chronic splenic vein thrombosis redemonstrated. No definite lymphadenopathy noted in the abdomen. Other: No significant volume of ascites and no pneumoperitoneum noted in the visualized portions of the peritoneal cavity. Musculoskeletal: There are no aggressive appearing lytic or blastic lesions noted in the visualized portions of the skeleton. IMPRESSION: 1. Considerable interval  enlargement of chronic pancreatic pseudocyst in the distal body and tail of the pancreas, which now extends into adjacent portions of the retroperitoneum, including extension into the gastrosplenic ligament  making broad contact with the undersurface of the body of the stomach, as above. 2. Chronic splenic vein thrombosis redemonstrated. 3. Postoperative changes of prior abdominal aortic aneurysm repair with persistent aneurysmal dilatation of the infrarenal abdominal aorta which measures 4.7 x 4.2 cm in diameter. 4. Hepatic steatosis. 5. Morphologic changes in the liver suggesting underlying cirrhosis. 6. Patchy areas of mild ground-glass attenuation in the left lower lobe, new compared to the prior study, likely of infectious or inflammatory etiology. 7. Additional incidental findings, as above. Electronically Signed   By: Trudie Reed M.D.   On: 02/10/2023 09:19       Kathlen Mody M.D. [facility] 02/10/2023, 12:24 PM  Available via Epic secure chat 7am-7pm After 7 pm, please refer to night coverage provider listed on amion.

## 2023-02-11 ENCOUNTER — Inpatient Hospital Stay (HOSPITAL_COMMUNITY): Payer: PPO | Admitting: Certified Registered"

## 2023-02-11 ENCOUNTER — Encounter (HOSPITAL_COMMUNITY): Payer: Self-pay | Admitting: Internal Medicine

## 2023-02-11 ENCOUNTER — Encounter (HOSPITAL_COMMUNITY)
Admission: EM | Disposition: A | Discharge status: Home / Self Care | Payer: Self-pay | Source: Home / Self Care | Attending: Internal Medicine

## 2023-02-11 DIAGNOSIS — K259 Gastric ulcer, unspecified as acute or chronic, without hemorrhage or perforation: Secondary | ICD-10-CM | POA: Diagnosis not present

## 2023-02-11 DIAGNOSIS — I2510 Atherosclerotic heart disease of native coronary artery without angina pectoris: Secondary | ICD-10-CM

## 2023-02-11 DIAGNOSIS — K449 Diaphragmatic hernia without obstruction or gangrene: Secondary | ICD-10-CM

## 2023-02-11 DIAGNOSIS — R079 Chest pain, unspecified: Secondary | ICD-10-CM | POA: Diagnosis not present

## 2023-02-11 DIAGNOSIS — F17210 Nicotine dependence, cigarettes, uncomplicated: Secondary | ICD-10-CM

## 2023-02-11 DIAGNOSIS — I10 Essential (primary) hypertension: Secondary | ICD-10-CM | POA: Diagnosis not present

## 2023-02-11 DIAGNOSIS — K3189 Other diseases of stomach and duodenum: Secondary | ICD-10-CM | POA: Diagnosis not present

## 2023-02-11 DIAGNOSIS — E1159 Type 2 diabetes mellitus with other circulatory complications: Secondary | ICD-10-CM | POA: Diagnosis not present

## 2023-02-11 DIAGNOSIS — I714 Abdominal aortic aneurysm, without rupture, unspecified: Secondary | ICD-10-CM | POA: Diagnosis not present

## 2023-02-11 DIAGNOSIS — I252 Old myocardial infarction: Secondary | ICD-10-CM

## 2023-02-11 HISTORY — PX: EUS: SHX5427

## 2023-02-11 HISTORY — PX: ESOPHAGOGASTRODUODENOSCOPY: SHX5428

## 2023-02-11 LAB — CBC
HCT: 42.2 % (ref 39.0–52.0)
Hemoglobin: 13.8 g/dL (ref 13.0–17.0)
MCH: 28.3 pg (ref 26.0–34.0)
MCHC: 32.7 g/dL (ref 30.0–36.0)
MCV: 86.7 fL (ref 80.0–100.0)
Platelets: 152 10*3/uL (ref 150–400)
RBC: 4.87 MIL/uL (ref 4.22–5.81)
RDW: 14.3 % (ref 11.5–15.5)
WBC: 6.9 10*3/uL (ref 4.0–10.5)
nRBC: 0 % (ref 0.0–0.2)

## 2023-02-11 LAB — BASIC METABOLIC PANEL
Anion gap: 8 (ref 5–15)
BUN: 24 mg/dL — ABNORMAL HIGH (ref 8–23)
CO2: 23 mmol/L (ref 22–32)
Calcium: 8.7 mg/dL — ABNORMAL LOW (ref 8.9–10.3)
Chloride: 101 mmol/L (ref 98–111)
Creatinine, Ser: 1.56 mg/dL — ABNORMAL HIGH (ref 0.61–1.24)
GFR, Estimated: 50 mL/min — ABNORMAL LOW (ref >60)
Glucose, Bld: 192 mg/dL — ABNORMAL HIGH (ref 70–99)
Potassium: 3.9 mmol/L (ref 3.5–5.1)
Sodium: 132 mmol/L — ABNORMAL LOW (ref 135–145)

## 2023-02-11 LAB — GLUCOSE, CAPILLARY
Glucose-Capillary: 182 mg/dL — ABNORMAL HIGH (ref 70–99)
Glucose-Capillary: 240 mg/dL — ABNORMAL HIGH (ref 70–99)
Glucose-Capillary: 168 mg/dL — ABNORMAL HIGH (ref 70–99)
Glucose-Capillary: 204 mg/dL — ABNORMAL HIGH (ref 70–99)

## 2023-02-11 SURGERY — UPPER ENDOSCOPIC ULTRASOUND (EUS) LINEAR
Anesthesia: Monitor Anesthesia Care

## 2023-02-11 MED ORDER — CIPROFLOXACIN IN D5W 400 MG/200ML IV SOLN
INTRAVENOUS | Status: AC
  Filled 2023-02-11: qty 200

## 2023-02-11 MED ORDER — PHENYLEPHRINE 80 MCG/ML (10ML) SYRINGE FOR IV PUSH (FOR BLOOD PRESSURE SUPPORT)
PREFILLED_SYRINGE | INTRAVENOUS | Status: DC | PRN
  Administered 2023-02-11 (×3): 80 ug via INTRAVENOUS

## 2023-02-11 MED ORDER — LACTATED RINGERS IV SOLN: INTRAVENOUS | Status: DC | PRN

## 2023-02-11 MED ORDER — CIPROFLOXACIN IN D5W 400 MG/200ML IV SOLN
INTRAVENOUS | Status: DC | PRN
  Administered 2023-02-11: 400 mg via INTRAVENOUS

## 2023-02-11 MED ORDER — LIDOCAINE 2% (20 MG/ML) 5 ML SYRINGE
INTRAMUSCULAR | Status: DC | PRN
  Administered 2023-02-11: 60 mg via INTRAVENOUS
  Administered 2023-02-11: 20 mg via INTRAVENOUS

## 2023-02-11 MED ORDER — SODIUM CHLORIDE 0.9 % IV SOLN: INTRAVENOUS | Status: DC

## 2023-02-11 MED ORDER — PROPOFOL 500 MG/50ML IV EMUL
INTRAVENOUS | Status: DC | PRN
  Administered 2023-02-11: 100 ug/kg/min via INTRAVENOUS

## 2023-02-11 NOTE — Progress Notes (Signed)
[name], is a 62 y.o. male, DOB - [birth year], UJW:[id number] Admit date - 02/06/2023    Outpatient Primary MD for the patient is Clayton Peterson, Tenna Child, FNP  LOS - 4  days    Brief summary    Clayton Peterson is a 62 y.o. male with medical history significant of CAD s/p stent, AAA s/p repair, DM, HTN, and HLD presenting with chest pain.  He reports prior h/o MI but last cath was in 2012.  Last night, he awoke from sleep about 0200 with severe chest pain.   H/o AAA repair, prior MI with stent.  Trop neg.  ECG unchanged.  CTA neg for dissection and no PE.   Assessment & Plan    Assessment and Plan:  Intermittent chest pain   Resolved.  EKG does not show ischemic changes.  Troponins are negative.  In view of his CAD (s/p DES to [facility]), ruptured AAA s/p repair 2009 , underwent cardiac cath, did not show any blockages . He has chronic RCA occlusion with collaterals. He has patent LAD and circumflex.  Meanwhile continue with Aspirin, Metoprolol 50 mg BID.  and crestor 40 mg daily and fenofibrate 160 mg daily. Echocardiogram ordered and reviewed with the patient.    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  Last EGD in 8/22 showing gastritis and non bleeding duodenal ulcer.  Pepcid added. Liver panl wnl. Lipase and amylase wnl.  He underwent EGD  on 4/19 showing a single large sub epithelial mass causing extrinsic compression in the gastric fundus possibly due to large pseudocyst of the pancreas noted on CTA. Two non bleeding gastric ulcers with clear ulcer base were found in the gastric fundus along the sub epithelial mass. Biopsies taken for H Pylori testing.  He underwent Pancreas CT on 4/19  showing Considerable interval enlargement of chronic pancreatic pseudocyst in the distal body  and tail of the pancreas, extending into the retroperitoneal area and  extension into the gastrosplenic ligament making broad contact with the undersurface of the body of the stomach.  Will need further evaluation with EUS to see if its amenable to drainage, possibly in the next 24 to 48  hours.  Underwent EGD/ EUS this morning, cystic lesion in the pancreatic tail.    Hypertension:  BP parameters are well controlled.   Insulin dependent DM type 2:  CBG (last 3)  Recent Labs    02/10/23 2042 02/11/23 0806 02/11/23 1141  GLUCAP 222* 182* 240*    Resume Semglee and SSI.  A1c is 8.7%. on 12/2022.     Mild leukocytosis  Resolved.    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 gently hydrate and repeat renal parameters show improvement to 1.5. Continue to monitor.    Mild hyponatremia:  Monitor.     Estimated body mass index is 29.73 kg/m as calculated from the following:   Height as of this encounter:  5\' 9"  (1.753 m).   Weight as of this encounter: 91.3 kg.  Code Status: full code.  DVT Prophylaxis:  Heparin.   Level of Care: Level of care: Telemetry Cardiac Family Communication: none at bedside.   Disposition Plan:     Remains inpatient appropriate:  pending eval by GI.   Procedures:  Cardiac catheterization.  EGD Consultants:   Cardiology GAstroenterology.   Antimicrobials:   Anti-infectives (From admission, onward)    None        Medications  Scheduled Meds:  aspirin EC  81 mg Oral Daily   famotidine  20 mg Oral BID   fenofibrate  160 mg Oral Daily   gabapentin  100 mg Oral Daily   insulin aspart  0-15 Units Subcutaneous TID WC   insulin aspart  0-5 Units Subcutaneous QHS   insulin glargine-yfgn  30 Units Subcutaneous Daily   metoprolol tartrate  50 mg Oral BID   nicotine  21 mg Transdermal Daily   omega-3 acid ethyl esters  2 g Oral BID   pantoprazole  40 mg Oral BID AC   rosuvastatin  40 mg Oral Daily    sodium chloride flush  3 mL Intravenous Q12H   sodium chloride flush  3 mL Intravenous Q12H   Continuous Infusions:  sodium chloride     sodium chloride     sodium chloride 50 mL/hr at 02/10/23 1606   PRN Meds:.sodium chloride, sodium chloride, acetaminophen, ondansetron (ZOFRAN) IV, sodium chloride flush, sodium chloride flush    Subjective:   [name] was seen and examined today.  No new complaints.    Objective:   Vitals:   02/11/23 0814 02/11/23 0815 02/11/23 0820 02/11/23 1142  BP: 95/63 95/63 105/73 (!) 124/59  Pulse: (!) 54 (!) 53 (!) 56 (!) 56  Resp: 17 14 11 18   Temp:   98.7 F (37.1 C) 98.5 F (36.9 C)  TempSrc:   Oral Oral  SpO2: 94% 94% 94% 96%  Weight:      Height:        Intake/Output Summary (Last 24 hours) at 02/11/2023 1247 Last data filed at 02/11/2023 1000 Gross per 24 hour  Intake 1372.95 ml  Output 1650 ml  Net -277.05 ml    Filed Weights   02/06/23 1500 02/06/23 2258  Weight: 94.3 kg 91.3 kg     Exam. General exam: Appears calm and comfortable  Respiratory system: Clear to auscultation. Respiratory effort normal. Cardiovascular system: S1 & S2 heard, RRR. No JVD, . No pedal edema. Gastrointestinal system: Abdomen is nondistended, soft and nontender.  Central nervous system: Alert and oriented. No focal neurological deficits. Extremities: Symmetric 5 x 5 power. Skin: No rashes, lesions or ulcers Psychiatry: Mood & affect appropriate.          Data Reviewed:  I have personally reviewed following labs and imaging studies   CBC Lab Results  Component Value Date   WBC 6.9 02/11/2023   RBC 4.87 02/11/2023   HGB 13.8 02/11/2023   HCT 42.2 02/11/2023   MCV 86.7 02/11/2023   MCH 28.3 02/11/2023   PLT 152 02/11/2023   MCHC 32.7 02/11/2023   RDW 14.3 02/11/2023   LYMPHSABS 1.1 12/29/2022   MONOABS 0.9 02/17/2022   EOSABS 0.1 12/29/2022   BASOSABS 0.1 12/29/2022     Last metabolic panel Lab Results  Component Value  Date   NA 132 (L) 02/11/2023   K 3.9 02/11/2023   CL 101 02/11/2023   CO2 23 02/11/2023  BUN 24 (H) 02/11/2023   CREATININE 1.56 (H) 02/11/2023   GLUCOSE 192 (H) 02/11/2023   GFRNONAA 50 (L) 02/11/2023   CALCIUM 8.7 (L) 02/11/2023   PHOS 3.1 02/14/2022   PROT 5.7 (L) 02/08/2023   ALBUMIN 2.7 (L) 02/08/2023   LABGLOB 2.3 12/29/2022   AGRATIO 1.8 12/29/2022   BILITOT 0.5 02/08/2023   ALKPHOS 42 02/08/2023   AST 13 (L) 02/08/2023   ALT 13 02/08/2023   ANIONGAP 8 02/11/2023    CBG (last 3)  Recent Labs    02/10/23 2042 02/11/23 0806 02/11/23 1141  GLUCAP 222* 182* 240*       Coagulation Profile: No results for input(s): "INR", "PROTIME" in the last 168 hours.   Radiology Studies: CT PANCREAS ABDOMEN W WO CONTRAST  Result Date: 02/10/2023 CLINICAL DATA:  62 year old male with history of pancreatitis with suspected pancreatic pseudocyst. EXAM: CT ABDOMEN WITHOUT AND WITH CONTRAST TECHNIQUE: Multidetector CT imaging of the abdomen was performed following the standard protocol before and following the bolus administration of intravenous contrast. RADIATION DOSE REDUCTION: This exam was performed according to the departmental dose-optimization program which includes automated exposure control, adjustment of the mA and/or kV according to patient size and/or use of iterative reconstruction technique. CONTRAST:  OMNIPAQUE IOHEXOL 350 MG/ML SOLN COMPARISON:  CT of the abdomen and pelvis 12/12/2022. Chest CTA 02/06/2023. FINDINGS: Lower chest: Patchy areas of mild ground-glass attenuation noted in the left lower lobe, new compared to the recent chest CT, likely of infectious or inflammatory etiology. Atherosclerotic calcifications in the distal descending thoracic aorta as well as the right coronary artery. Hepatobiliary: Diffuse low attenuation throughout the hepatic parenchyma, indicative of a background of hepatic steatosis. Liver has a slightly shrunken appearance and nodular  contour, indicative of underlying cirrhosis. No suspicious cystic or solid hepatic lesions. Gallbladder is unremarkable in appearance. Pancreas: Previously noted complex lesion in the distal body and tail of the pancreas has substantially increased in size compared to the prior study (axial image 26 of series 10 and coronal image 53 of series 14) currently measuring 9.5 x 6.8 x 12.3 cm. This lesion extends cephalad from the pancreas into the gastrosplenic ligament making broad contact with the undersurface of the body of the stomach, and extends caudally in the adjacent regions of the retroperitoneum. This lesion demonstrates peripheral rim enhancement, but is otherwise relatively simple in appearance with internal low-attenuation. Head, uncinate process and proximal body of the pancreas are otherwise normal in appearance. Spleen: Small splenules medial to the spleen. Otherwise, unremarkable. Adrenals/Urinary Tract: Moderate atrophy of the right kidney. Mild multifocal cortical thinning in the left kidney. Nonobstructive calculi in the left renal collecting system measuring up to 6 mm. No suspicious renal lesions. No hydroureteronephrosis in the visualized portions of the abdomen. Bilateral adrenal glands are normal in appearance. Stomach/Bowel: Stomach is distorted by the large pancreatic pseudocyst (described above). No pathologic dilatation of visualized small bowel or colon. Vascular/Lymphatic: Aortic atherosclerosis with aneurysmal dilatation of the abdominal aorta, most severe in the immediate infrarenal region where it the vessel measures up to 4.7 x 4.2 cm (previously 4.6 x 3.9 cm), where there is a large amount of eccentric nonenhancing material, compatible with atheromatous plaque and/or mural thrombus. Postoperative changes are noted from prior aorta iliac graft repair, partially imaged, with patency of the lumen. Chronic splenic vein thrombosis redemonstrated. No definite lymphadenopathy noted in the  abdomen. Other: No significant volume of ascites and no pneumoperitoneum noted in the visualized portions of the peritoneal  cavity. Musculoskeletal: There are no aggressive appearing lytic or blastic lesions noted in the visualized portions of the skeleton. IMPRESSION: 1. Considerable interval enlargement of chronic pancreatic pseudocyst in the distal body and tail of the pancreas, which now extends into adjacent portions of the retroperitoneum, including extension into the gastrosplenic ligament making broad contact with the undersurface of the body of the stomach, as above. 2. Chronic splenic vein thrombosis redemonstrated. 3. Postoperative changes of prior abdominal aortic aneurysm repair with persistent aneurysmal dilatation of the infrarenal abdominal aorta which measures 4.7 x 4.2 cm in diameter. 4. Hepatic steatosis. 5. Morphologic changes in the liver suggesting underlying cirrhosis. 6. Patchy areas of mild ground-glass attenuation in the left lower lobe, new compared to the prior study, likely of infectious or inflammatory etiology. 7. Additional incidental findings, as above. Electronically Signed   By: Trudie Reed M.D.   On: 02/10/2023 09:19       Kathlen Mody M.D. [facility] 02/11/2023, 12:47 PM  Available via Epic secure chat 7am-7pm After 7 pm, please refer to night coverage provider listed on amion.

## 2023-02-11 NOTE — Anesthesia Postprocedure Evaluation (Signed)
Anesthesia Post Note  Patient: Media planner  Procedure(s) Performed: UPPER ENDOSCOPIC ULTRASOUND (EUS) LINEAR (Left) ESOPHAGOGASTRODUODENOSCOPY (EGD)     Patient location during evaluation: PACU Anesthesia Type: MAC Level of consciousness: awake and alert Pain management: pain level controlled Vital Signs Assessment: post-procedure vital signs reviewed and stable Respiratory status: spontaneous breathing, nonlabored ventilation, respiratory function stable and patient connected to nasal cannula oxygen Cardiovascular status: stable and blood pressure returned to baseline Postop Assessment: no apparent nausea or vomiting Anesthetic complications: no  No notable events documented.  Last Vitals:  Vitals:   02/11/23 1142 02/11/23 1601  BP: (!) 124/59 122/68  Pulse: (!) 56 (!) 55  Resp: 18 16  Temp: 36.9 C 36.9 C  SpO2: 96% 96%    Last Pain:  Vitals:   02/11/23 1601  TempSrc: Oral  PainSc: 0-No pain                 Kennieth Rad

## 2023-02-11 NOTE — Anesthesia Preprocedure Evaluation (Signed)
Anesthesia Evaluation  Patient identified by MRN, date of birth, ID band Patient awake    Reviewed: Allergy & Precautions, NPO status , Patient's Chart, lab work & pertinent test results, reviewed documented beta blocker date and time   Airway Mallampati: II  TM Distance: >3 FB Neck ROM: Full    Dental  (+) Missing, Loose, Dental Advisory Given, Poor Dentition   Pulmonary sleep apnea , Current Smoker and Patient abstained from smoking.   Pulmonary exam normal breath sounds clear to auscultation       Cardiovascular hypertension, Pt. on medications and Pt. on home beta blockers + CAD and + Past MI  Normal cardiovascular exam Rhythm:Regular Rate:Normal  Echo 01/2023  1. Left ventricular ejection fraction, by estimation, is 55 to 60%. Left ventricular ejection fraction by PLAX is 58 %. The left ventricle has normal function. The left ventricle has no regional wall motion abnormalities. codominant mitral inflow, suggesting borderline diastolic dysfunciton.   2. Right ventricular systolic function is normal. The right ventricular size is normal. Tricuspid regurgitation signal is inadequate for assessing PA pressure.   3. The mitral valve is grossly normal. Trivial mitral valve regurgitation.   4. The aortic valve is tricuspid. Aortic valve regurgitation is not visualized.   5. The inferior vena cava is normal in size with greater than 50% respiratory variability, suggesting right atrial pressure of 3 mmHg.   Comparison(s): No prior Echocardiogram.    LHC 01/2023  Prox RCA to Mid RCA lesion is 100% stenosed.   Prox LAD lesion is 40% stenosed.   Prox Cx to Dist Cx lesion is 20% stenosed.   The left ventricular systolic function is normal.   Mild proximal LAD stenosis Mild plaque through the mid Circumflex.  The large dominant RCA is occluded in the mid stented segment. This appears chronic. Mid and distal RCA fills from left to right  collaterals.  Normal LV systolic function LVEDP 6 mmHg   Recommendations: Non-cardiac chest pain. Medical management of CAD.       Neuro/Psych  negative psych ROS   GI/Hepatic Neg liver ROS,GERD  ,,  Endo/Other  diabetes    Renal/GU Renal disease     Musculoskeletal negative musculoskeletal ROS (+)    Abdominal   Peds  Hematology negative hematology ROS (+)   Anesthesia Other Findings   Reproductive/Obstetrics                             Anesthesia Physical Anesthesia Plan  ASA: 3  Anesthesia Plan: MAC   Post-op Pain Management: Minimal or no pain anticipated   Induction: Intravenous  PONV Risk Score and Plan: 0 and TIVA, Propofol infusion, Treatment may vary due to age or medical condition and Ondansetron  Airway Management Planned: Natural Airway and Nasal Cannula  Additional Equipment:   Intra-op Plan:   Post-operative Plan:   Informed Consent: I have reviewed the patients History and Physical, chart, labs and discussed the procedure including the risks, benefits and alternatives for the proposed anesthesia with the patient or authorized representative who has indicated his/her understanding and acceptance.     Dental advisory given  Plan Discussed with: CRNA  Anesthesia Plan Comments:         Anesthesia Quick Evaluation

## 2023-02-11 NOTE — Op Note (Signed)
[facility] Patient Name: Clayton Peterson Procedure Date : 02/11/2023 MRN: [id number] Attending MD: Jeani Hawking , MD, [id number] Date of Birth: 05-30-1961 CSN: [id number] Age: 62 Admit Type: Inpatient Procedure:                Upper EUS Indications:              Pancreatic cyst on CT scan Providers:                Jeani Hawking, MD, Adolph Pollack, RN, Beryle Beams, Technician, Milbert Coulter CRNA Referring MD:              Medicines:                Propofol per Anesthesia Complications:            No immediate complications. Estimated Blood Loss:     Estimated blood loss: none. Procedure:                Pre-Anesthesia Assessment:                           - Prior to the procedure, a History and Physical                            was performed, and patient medications and                            allergies were reviewed. The patient's tolerance of                            previous anesthesia was also reviewed. The risks                            and benefits of the procedure and the sedation                            options and risks were discussed with the patient.                            All questions were answered, and informed consent                            was obtained. Prior Anticoagulants: The patient has                            taken no anticoagulant or antiplatelet agents. ASA                            Grade Assessment: III - A patient with severe                            systemic disease. After reviewing the risks and  benefits, the patient was deemed in satisfactory                            condition to undergo the procedure.                           - Sedation was administered by an anesthesia                            professional. Deep sedation was attained.                           After obtaining informed consent, the endoscope was                            passed under direct  vision. Throughout the                            procedure, the patient's blood pressure, pulse, and                            oxygen saturations were monitored continuously. The                            [id number] ([id number]) Olympus linear ultrasound scope                            was introduced through the mouth, and advanced to                            the second part of duodenum. The upper EUS was                            accomplished without difficulty. The patient                            tolerated the procedure well. Scope In: Scope Out: Findings:      ENDOSONOGRAPHIC FINDING: :      A hypoechoic and calcified lesion suggestive of a cyst was identified in       the pancreatic body. It is not in obvious communication with the       pancreatic duct. The lesion measured 88 mm by 77 mm in maximal       cross-sectional diameter. There was a single compartment without septae.       The outer wall of the lesion was thick. There was no associated mass.       There was internal debris within the fluid-filled cavity.      In the distal body of the pancreas there was evidence of a large 88 mm x       77 mm pancreatic cyst. The cyst fluid was hypoechoic and there were       multiple amorphic hyperechoic densities. The cyst fluid was not the       typical pseudocyst fluid, ie, anechoic/thin/watery. Because of the       current findings, simple cyst drainage/aspiration was not  performed. The       tail and body of the pancreas appeared normal. In the head of the       pancreas there was some lobularity. The gallbladder was intact and       unremarkable. The CBD was measured to be 5 mm. The PD in the head of the       pancreas was 2 mm and there was no upstream PD dilation. Hepatic       steatosis was identified. Impression:               - A cystic lesion was seen in the pancreatic body.                           - No specimens collected. Recommendation:           - Return patient to  hospital ward for ongoing care.                           - Dr. Meridee Score for further management. Procedure Code(s):        --- Professional ---                           [phone], Esophagogastroduodenoscopy, flexible,                            transoral; with endoscopic ultrasound examination                            limited to the esophagus, stomach or duodenum, and                            adjacent structures Diagnosis Code(s):        --- Professional ---                           K86.2, Cyst of pancreas CPT copyright 2022 American [employer]. All rights reserved. The codes documented in this report are preliminary and upon coder review may  be revised to meet current compliance requirements. Jeani Hawking, MD Jeani Hawking, MD 02/11/2023 8:17:50 AM This report has been signed electronically. Number of Addenda: 0

## 2023-02-11 NOTE — Transfer of Care (Signed)
Immediate Anesthesia Transfer of Care Note  Patient: Media planner  Procedure(s) Performed: UPPER ENDOSCOPIC ULTRASOUND (EUS) LINEAR (Left)  Patient Location: PACU  Anesthesia Type:MAC  Level of Consciousness: awake, alert , oriented, and patient cooperative  Airway & Oxygen Therapy: Patient Spontanous Breathing and Patient connected to nasal cannula oxygen  Post-op Assessment: Report given to RN and Post -op Vital signs reviewed and stable  Post vital signs: Reviewed and stable  Last Vitals:  Vitals Value Taken Time  BP 94/48 02/11/23 0806  Temp 36 C 02/11/23 0806  Pulse 50 02/11/23 0810  Resp 27 02/11/23 0810  SpO2 88 % 02/11/23 0810  Vitals shown include unvalidated device data.  Last Pain:  Vitals:   02/11/23 0806  TempSrc:   PainSc: Asleep      Patients Stated Pain Goal: 2 (02/09/23 2000)  Complications: No notable events documented.

## 2023-02-11 NOTE — Interval H&P Note (Signed)
History and Physical Interval Note:  02/11/2023 7:40 AM  [name]  has presented today for surgery, with the diagnosis of Symptomatic pseudocyst.  The various methods of treatment have been discussed with the patient and family. After consideration of risks, benefits and other options for treatment, the patient has consented to  Procedure(s): UPPER ENDOSCOPIC ULTRASOUND (EUS) LINEAR (Left) as a surgical intervention.  The patient's history has been reviewed, patient examined, no change in status, stable for surgery.  I have reviewed the patient's chart and labs.  Questions were answered to the patient's satisfaction.     Maquita Sandoval D

## 2023-02-12 ENCOUNTER — Encounter (HOSPITAL_COMMUNITY)
Admission: EM | Disposition: A | Discharge status: Home / Self Care | Payer: Self-pay | Source: Home / Self Care | Attending: Internal Medicine

## 2023-02-12 ENCOUNTER — Other Ambulatory Visit: Payer: Self-pay | Admitting: Internal Medicine

## 2023-02-12 DIAGNOSIS — R079 Chest pain, unspecified: Secondary | ICD-10-CM | POA: Diagnosis not present

## 2023-02-12 DIAGNOSIS — K219 Gastro-esophageal reflux disease without esophagitis: Secondary | ICD-10-CM

## 2023-02-12 DIAGNOSIS — N1832 Chronic kidney disease, stage 3b: Secondary | ICD-10-CM | POA: Diagnosis not present

## 2023-02-12 DIAGNOSIS — E1159 Type 2 diabetes mellitus with other circulatory complications: Secondary | ICD-10-CM | POA: Diagnosis not present

## 2023-02-12 DIAGNOSIS — K863 Pseudocyst of pancreas: Secondary | ICD-10-CM

## 2023-02-12 DIAGNOSIS — R1013 Epigastric pain: Secondary | ICD-10-CM

## 2023-02-12 LAB — CBC
HCT: 41.6 % (ref 39.0–52.0)
Hemoglobin: 13.5 g/dL (ref 13.0–17.0)
MCH: 28 pg (ref 26.0–34.0)
MCHC: 32.5 g/dL (ref 30.0–36.0)
MCV: 86.3 fL (ref 80.0–100.0)
Platelets: 138 10*3/uL — ABNORMAL LOW (ref 150–400)
RBC: 4.82 MIL/uL (ref 4.22–5.81)
RDW: 14 % (ref 11.5–15.5)
WBC: 5.9 10*3/uL (ref 4.0–10.5)
nRBC: 0 % (ref 0.0–0.2)

## 2023-02-12 LAB — GLUCOSE, CAPILLARY
Glucose-Capillary: 147 mg/dL — ABNORMAL HIGH (ref 70–99)
Glucose-Capillary: 125 mg/dL — ABNORMAL HIGH (ref 70–99)
Glucose-Capillary: 207 mg/dL — ABNORMAL HIGH (ref 70–99)
Glucose-Capillary: 248 mg/dL — ABNORMAL HIGH (ref 70–99)

## 2023-02-12 LAB — BASIC METABOLIC PANEL
Anion gap: 9 (ref 5–15)
BUN: 26 mg/dL — ABNORMAL HIGH (ref 8–23)
CO2: 25 mmol/L (ref 22–32)
Calcium: 8.9 mg/dL (ref 8.9–10.3)
Chloride: 100 mmol/L (ref 98–111)
Creatinine, Ser: 1.74 mg/dL — ABNORMAL HIGH (ref 0.61–1.24)
GFR, Estimated: 44 mL/min — ABNORMAL LOW (ref >60)
Glucose, Bld: 125 mg/dL — ABNORMAL HIGH (ref 70–99)
Potassium: 3.8 mmol/L (ref 3.5–5.1)
Sodium: 134 mmol/L — ABNORMAL LOW (ref 135–145)

## 2023-02-12 SURGERY — UPPER ENDOSCOPIC ULTRASOUND (EUS) LINEAR
Anesthesia: General

## 2023-02-12 NOTE — Plan of Care (Signed)

## 2023-02-12 NOTE — H&P (View-Only) (Signed)
Daily Progress Note  DOA: 02/06/2023 Hospital Day: 7 Chief Complaint: abdominal pain / pancreatic pseudocyst   ASSESSMENT   Brief summary;  62 yo male with a pmh of  adenomatous colon polyps, GERD, gastritis, duodenal ulcer in the setting of NSAIDs,  pancreatitis in April 2023 ( ? Secondary to hypertriglyceridemia), AAA repair, CAD s/p remote PCI. He is followed by Melonie [place]. Admitted here 4/16 with chest /epigastric pain. Evaluated by Cardiology, underwent cath. Pain felt to be non-cardiac and GI was consulted for epigastric pain on 4/18. EGD done and showed non-bleeding gastric ulcers. CT scan showed considerable interval progression of chronic pancreatic pseudocyst  # Symptomatic enlarging pancreatic pseudocyst cyst which now extends into adjacent portions of the retroperitoneum, including extension into the gastrosplenic ligament making broad contact with the undersurface of the body of the stomach.   #Chronic splenic vein thrombosis  # ? Cirrhosis on CT scan.  If he has cirrhosis there is no evidence for decompensation   PLAN   -Gastric biopsies pending.  -For cystogastrostomy tomorrow with Dr. Meridee Score at 12 pm. Risks and benefits of the procedure were explained and he agrees to proceed.  -NPO after MN -Outpatient evaluation of possible cirrhosis with primary GI , Dr. Marletta Lor    Subjective / New events:  Mild left upper abdominal pain after eating lunch. No nausea.   Objective    EGD 4/19 - Esophagogastric landmarks identified. - Small hiatal hernia. - Normal esophagus otherwise. - A single subepithelial mass found in the gastric fundus associated with 2 non-bleeding gastric ulcers with a clean ulcer base (Forrest Class III). - Normal stomach otherwise - biopsies taken to rule out H pylori - Normal examined duodenum.  02/09/23 CT pancreas IMPRESSION: 1. Considerable interval enlargement of chronic pancreatic pseudocyst in the distal body and tail of the  pancreas, which now extends into adjacent portions of the retroperitoneum, including extension into the gastrosplenic ligament making broad contact with the undersurface of the body of the stomach, as above. 2. Chronic splenic vein thrombosis redemonstrated. 3. Postoperative changes of prior abdominal aortic aneurysm repair with persistent aneurysmal dilatation of the infrarenal abdominal aorta which measures 4.7 x 4.2 cm in diameter. 4. Hepatic steatosis. 5. Morphologic changes in the liver suggesting underlying cirrhosis. 6. Patchy areas of mild ground-glass attenuation in the left lower lobe, new compared to the prior study, likely of infectious or inflammatory etiology. 7. Additional incidental findings, as above.   Recent Labs    02/10/23 0230 02/11/23 0209 02/12/23 0252  WBC 7.1 6.9 5.9  HGB 14.1 13.8 13.5  HCT 41.7 42.2 41.6  PLT 154 152 138*   BMET Recent Labs    02/10/23 0230 02/11/23 0209 02/12/23 0252  NA 131* 132* 134*  K 3.9 3.9 3.8  CL 99 101 100  CO2 GLUCOSE 119* 192* 125*  BUN 22 24* 26*  CREATININE 1.73* 1.56* 1.74*  CALCIUM 9.0 8.7* 8.9   LFT No results for input(s): "PROT", "ALBUMIN", "AST", "ALT", "ALKPHOS", "BILITOT", "BILIDIR", "IBILI" in the last 72 hours. PT/INR No results for input(s): "LABPROT", "INR" in the last 72 hours.   Scheduled inpatient medications:   aspirin EC  81 mg Oral Daily   famotidine  20 mg Oral BID   fenofibrate  160 mg Oral Daily   gabapentin  100 mg Oral Daily   insulin aspart  0-15 Units Subcutaneous TID WC   insulin aspart  0-5 Units Subcutaneous QHS   insulin glargine-yfgn  30 Units Subcutaneous Daily   metoprolol tartrate  50 mg Oral BID   nicotine  21 mg Transdermal Daily   omega-3 acid ethyl esters  2 g Oral BID   pantoprazole  40 mg Oral BID AC   rosuvastatin  40 mg Oral Daily   sodium chloride flush  3 mL Intravenous Q12H   sodium chloride flush  3 mL Intravenous Q12H   Continuous inpatient  infusions:   sodium chloride     sodium chloride     sodium chloride 50 mL/hr at 02/10/23 1606   PRN inpatient medications: sodium chloride, sodium chloride, acetaminophen, ondansetron (ZOFRAN) IV, sodium chloride flush, sodium chloride flush  Vital signs in last 24 hours: Temp:  [98.3 F (36.8 C)-98.8 F (37.1 C)] 98.3 F (36.8 C) (04/22 0733) Pulse Rate:  [50-60] 52 (04/22 0733) Resp:  [16-18] 18 (04/22 0733) BP: (111-122)/(59-68) 118/68 (04/22 0733) SpO2:  [96 %-97 %] 97 % (04/22 0733) Last BM Date : 02/08/23  Intake/Output Summary (Last 24 hours) at 02/12/2023 1517 Last data filed at 02/11/2023 2230 Gross per 24 hour  Intake 292.78 ml  Output --  Net 292.78 ml    Intake/Output from previous day: 04/21 0701 - 04/22 0700 In: 972.8 [P.O.:720; I.V.:252.8] Out: 800 [Urine:800] Intake/Output this shift: No intake/output data recorded.   Physical Exam:  General: Alert male in NAD Heart:  Regular rate and rhythm.  Pulmonary: Normal respiratory effort Abdomen: Soft, nondistended, nontender. Normal bowel sounds. Extremities: No lower extremity edema  Neurologic: Alert and oriented Psych: Pleasant. Cooperative. Insight appears normal.    Principal Problem:   Chronic chest pain with high risk for coronary artery disease Active Problems:   Essential hypertension, benign   Coronary artery disease   Mixed hyperlipidemia   DM type 2 causing vascular disease   Chronic kidney disease, stage 3b   Pancreatic pseudocyst   Cigarette nicotine dependence without complication   Class 3 severe obesity due to excess calories without serious comorbidity with body mass index (BMI) of 40.0 to 44.9 in adult   OSA on CPAP   AAA (abdominal aortic aneurysm) without rupture   Chest pain   Gastroesophageal reflux disease   Abdominal pain, epigastric   History of ulcer disease   Gastric ulcer without hemorrhage or perforation     LOS: 5 days   Nephtali Docken ,NP 02/12/2023, 3:17  PM       

## 2023-02-12 NOTE — Care Management Important Message (Signed)
Important Message  Patient Details  Name: Clayton Peterson MRN: [id number] Date of Birth: [birth year]   Medicare Important Message Given:  Yes     Renie Ora 02/12/2023, 3:12 PM

## 2023-02-12 NOTE — Progress Notes (Signed)
Daily Progress Note  DOA: 02/06/2023 Hospital Day: 7 Chief Complaint: abdominal pain / pancreatic pseudocyst   ASSESSMENT   Brief summary;  62 yo male with a pmh of  adenomatous colon polyps, GERD, gastritis, duodenal ulcer in the setting of NSAIDs,  pancreatitis in April 2023 ( ? Secondary to hypertriglyceridemia), AAA repair, CAD s/p remote PCI. He is followed by Melonie [place]. Admitted here 4/16 with chest /epigastric pain. Evaluated by Cardiology, underwent cath. Pain felt to be non-cardiac and GI was consulted for epigastric pain on 4/18. EGD done and showed non-bleeding gastric ulcers. CT scan showed considerable interval progression of chronic pancreatic pseudocyst  # Symptomatic enlarging pancreatic pseudocyst cyst which now extends into adjacent portions of the retroperitoneum, including extension into the gastrosplenic ligament making broad contact with the undersurface of the body of the stomach.   #Chronic splenic vein thrombosis  # ? Cirrhosis on CT scan.  If he has cirrhosis there is no evidence for decompensation   PLAN   -Gastric biopsies pending.  -For cystogastrostomy tomorrow with Dr. Meridee Score at 12 pm. Risks and benefits of the procedure were explained and he agrees to proceed.  -NPO after MN -Outpatient evaluation of possible cirrhosis with primary GI , Dr. Marletta Lor    Subjective / New events:  Mild left upper abdominal pain after eating lunch. No nausea.   Objective    EGD 4/19 - Esophagogastric landmarks identified. - Small hiatal hernia. - Normal esophagus otherwise. - A single subepithelial mass found in the gastric fundus associated with 2 non-bleeding gastric ulcers with a clean ulcer base (Forrest Class III). - Normal stomach otherwise - biopsies taken to rule out H pylori - Normal examined duodenum.  02/09/23 CT pancreas IMPRESSION: 1. Considerable interval enlargement of chronic pancreatic pseudocyst in the distal body and tail of the  pancreas, which now extends into adjacent portions of the retroperitoneum, including extension into the gastrosplenic ligament making broad contact with the undersurface of the body of the stomach, as above. 2. Chronic splenic vein thrombosis redemonstrated. 3. Postoperative changes of prior abdominal aortic aneurysm repair with persistent aneurysmal dilatation of the infrarenal abdominal aorta which measures 4.7 x 4.2 cm in diameter. 4. Hepatic steatosis. 5. Morphologic changes in the liver suggesting underlying cirrhosis. 6. Patchy areas of mild ground-glass attenuation in the left lower lobe, new compared to the prior study, likely of infectious or inflammatory etiology. 7. Additional incidental findings, as above.   Recent Labs    02/10/23 0230 02/11/23 0209 02/12/23 0252  WBC 7.1 6.9 5.9  HGB 14.1 13.8 13.5  HCT 41.7 42.2 41.6  PLT 154 152 138*   BMET Recent Labs    02/10/23 0230 02/11/23 0209 02/12/23 0252  NA 131* 132* 134*  K 3.9 3.9 3.8  CL 99 101 100  CO2 GLUCOSE 119* 192* 125*  BUN 22 24* 26*  CREATININE 1.73* 1.56* 1.74*  CALCIUM 9.0 8.7* 8.9   LFT No results for input(s): "PROT", "ALBUMIN", "AST", "ALT", "ALKPHOS", "BILITOT", "BILIDIR", "IBILI" in the last 72 hours. PT/INR No results for input(s): "LABPROT", "INR" in the last 72 hours.   Scheduled inpatient medications:   aspirin EC  81 mg Oral Daily   famotidine  20 mg Oral BID   fenofibrate  160 mg Oral Daily   gabapentin  100 mg Oral Daily   insulin aspart  0-15 Units Subcutaneous TID WC   insulin aspart  0-5 Units Subcutaneous QHS   insulin glargine-yfgn  30 Units Subcutaneous Daily   metoprolol tartrate  50 mg Oral BID   nicotine  21 mg Transdermal Daily   omega-3 acid ethyl esters  2 g Oral BID   pantoprazole  40 mg Oral BID AC   rosuvastatin  40 mg Oral Daily   sodium chloride flush  3 mL Intravenous Q12H   sodium chloride flush  3 mL Intravenous Q12H   Continuous inpatient  infusions:   sodium chloride     sodium chloride     sodium chloride 50 mL/hr at 02/10/23 1606   PRN inpatient medications: sodium chloride, sodium chloride, acetaminophen, ondansetron (ZOFRAN) IV, sodium chloride flush, sodium chloride flush  Vital signs in last 24 hours: Temp:  [98.3 F (36.8 C)-98.8 F (37.1 C)] 98.3 F (36.8 C) (04/22 0733) Pulse Rate:  [50-60] 52 (04/22 0733) Resp:  [16-18] 18 (04/22 0733) BP: (111-122)/(59-68) 118/68 (04/22 0733) SpO2:  [96 %-97 %] 97 % (04/22 0733) Last BM Date : 02/08/23  Intake/Output Summary (Last 24 hours) at 02/12/2023 1517 Last data filed at 02/11/2023 2230 Gross per 24 hour  Intake 292.78 ml  Output --  Net 292.78 ml    Intake/Output from previous day: 04/21 0701 - 04/22 0700 In: 972.8 [P.O.:720; I.V.:252.8] Out: 800 [Urine:800] Intake/Output this shift: No intake/output data recorded.   Physical Exam:  General: Alert male in NAD Heart:  Regular rate and rhythm.  Pulmonary: Normal respiratory effort Abdomen: Soft, nondistended, nontender. Normal bowel sounds. Extremities: No lower extremity edema  Neurologic: Alert and oriented Psych: Pleasant. Cooperative. Insight appears normal.    Principal Problem:   Chronic chest pain with high risk for coronary artery disease Active Problems:   Essential hypertension, benign   Coronary artery disease   Mixed hyperlipidemia   DM type 2 causing vascular disease   Chronic kidney disease, stage 3b   Pancreatic pseudocyst   Cigarette nicotine dependence without complication   Class 3 severe obesity due to excess calories without serious comorbidity with body mass index (BMI) of 40.0 to 44.9 in adult   OSA on CPAP   AAA (abdominal aortic aneurysm) without rupture   Chest pain   Gastroesophageal reflux disease   Abdominal pain, epigastric   History of ulcer disease   Gastric ulcer without hemorrhage or perforation     LOS: 5 days   Willette Cluster ,NP 02/12/2023, 3:17  PM

## 2023-02-12 NOTE — Progress Notes (Signed)
[name], is a 62 y.o. male, DOB - [birth year], WUJ:[id number] Admit date - 02/06/2023    Outpatient Primary MD for the patient is Del Newman Nip, Tenna Child, FNP  LOS - 5  days    Brief summary    Clayton Peterson is a 62 y.o. male with medical history significant of CAD s/p stent, AAA s/p repair, DM, HTN, and HLD presenting with chest pain.  He reports prior h/o MI but last cath was in 2012.  Last night, he awoke from sleep about 0200 with severe chest pain.   H/o AAA repair, prior MI with stent.  Trop neg.  ECG unchanged.  CTA neg for dissection and no PE.   Assessment & Plan    Assessment and Plan:  Intermittent chest pain   Resolved.  EKG does not show ischemic changes.  Troponins are negative.  In view of his CAD (s/p DES to [facility]), ruptured AAA s/p repair 2009 , underwent cardiac cath, did not show any blockages . He has chronic RCA occlusion with collaterals. He has patent LAD and circumflex.  Meanwhile continue with Aspirin, Metoprolol 50 mg BID.  and crestor 40 mg daily and fenofibrate 160 mg daily. Echocardiogram ordered and reviewed with the patient.    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  Last EGD in 8/22 showing gastritis and non bleeding duodenal ulcer.  Pepcid added. Liver panl wnl. Lipase and amylase wnl.  He underwent EGD  on 4/19 showing a single large sub epithelial mass causing extrinsic compression in the gastric fundus possibly due to large pseudocyst of the pancreas noted on CTA. Two non bleeding gastric ulcers with clear ulcer base were found in the gastric fundus along the sub epithelial mass. Biopsies taken for H Pylori testing.  He underwent Pancreas CT on 4/19  showing Considerable interval enlargement of chronic pancreatic pseudocyst in the distal body  and tail of the pancreas, extending into the retroperitoneal area and  extension into the gastrosplenic ligament making broad contact with the undersurface of the body of the stomach.  Will need further evaluation with EUS to see if its amenable to drainage, possibly in the next 24 to 48  hours.  Underwent EGD/ EUS this morning, cystic lesion in the pancreatic tail.  Plan for EUS tomorrow at noon.   Hypertension:  BP parameters are optimal.   Insulin dependent DM type 2:  CBG (last 3)  Recent Labs    02/11/23 1608 02/11/23 2118 02/12/23 0730  GLUCAP 168* 204* 147*    Resume Semglee and SSI.  A1c is 8.7%. on 12/2022.  No changes in meds.     Mild leukocytosis  Resolved.    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 on gentle hydration.  Continue to monitor.    Mild hyponatremia:  Monitor.     Estimated body mass index is 29.73 kg/m as calculated from the following:   Height  as of this encounter:  (1.753 m).   Weight as of this encounter: 91.3 kg.  Code Status: full code.  DVT Prophylaxis:  Heparin.   Level of Care: Level of care: Telemetry Cardiac Family Communication: none at bedside.   Disposition Plan:     Remains inpatient appropriate:  pending eval by GI.   Procedures:  Cardiac catheterization.  EGD Consultants:   Cardiology GAstroenterology.   Antimicrobials:   Anti-infectives (From admission, onward)    None        Medications  Scheduled Meds:  aspirin EC  81 mg Oral Daily   famotidine  20 mg Oral BID   fenofibrate  160 mg Oral Daily   gabapentin  100 mg Oral Daily   insulin aspart  0-15 Units Subcutaneous TID WC   insulin aspart  0-5 Units Subcutaneous QHS   insulin glargine-yfgn  30 Units Subcutaneous Daily   metoprolol tartrate  50 mg Oral BID   nicotine  21 mg Transdermal Daily   omega-3 acid ethyl esters  2 g Oral BID   pantoprazole  40 mg Oral BID AC   rosuvastatin  40 mg Oral Daily    sodium chloride flush  3 mL Intravenous Q12H   sodium chloride flush  3 mL Intravenous Q12H   Continuous Infusions:  sodium chloride     sodium chloride     sodium chloride 50 mL/hr at 02/10/23 1606   PRN Meds:.sodium chloride, sodium chloride, acetaminophen, ondansetron (ZOFRAN) IV, sodium chloride flush, sodium chloride flush    Subjective:   [name] was seen and examined today.  No new complaints.    Objective:   Vitals:   02/11/23 1601 02/11/23 1924 02/12/23 0324 02/12/23 0733  BP: 122/68 111/66 (!) 111/59 118/68  Pulse: (!) 55 60 (!) 50 (!) 52  Resp: Temp: 98.4 F (36.9 C) 98.8 F (37.1 C) 98.5 F (36.9 C) 98.3 F (36.8 C)  TempSrc: Oral Oral Oral Oral  SpO2: 96%  96% 97%  Weight:      Height:        Intake/Output Summary (Last 24 hours) at 02/12/2023 1002 Last data filed at 02/11/2023 2230 Gross per 24 hour  Intake 532.78 ml  Output --  Net 532.78 ml    Filed Weights   02/06/23 1500 02/06/23 2258  Weight: 94.3 kg 91.3 kg     Exam. General exam: Appears calm and comfortable  Respiratory system: Clear to auscultation. Respiratory effort normal. Cardiovascular system: S1 & S2 heard, RRR. No JVD,  Gastrointestinal system: Abdomen is nondistended, soft and nontender.  Central nervous system: Alert and oriented. No focal neurological deficits. Extremities: Symmetric 5 x 5 power. Skin: No rashes,  Psychiatry: Mood & affect appropriate.          Data Reviewed:  I have personally reviewed following labs and imaging studies   CBC Lab Results  Component Value Date   WBC 5.9 02/12/2023   RBC 4.82 02/12/2023   HGB 13.5 02/12/2023   HCT 41.6 02/12/2023   MCV 86.3 02/12/2023   MCH 28.0 02/12/2023   PLT 138 (L) 02/12/2023   MCHC 32.5 02/12/2023   RDW 14.0 02/12/2023   LYMPHSABS 1.1 12/29/2022   MONOABS 0.9 02/17/2022   EOSABS 0.1 12/29/2022   BASOSABS 0.1 12/29/2022     Last metabolic panel Lab Results  Component Value  Date   NA 134 (L) 02/12/2023   K 3.8 02/12/2023   CL 100 02/12/2023  CO2 25 02/12/2023   BUN 26 (H) 02/12/2023   CREATININE 1.74 (H) 02/12/2023   GLUCOSE 125 (H) 02/12/2023   GFRNONAA 44 (L) 02/12/2023   CALCIUM 8.9 02/12/2023   PHOS 3.1 02/14/2022   PROT 5.7 (L) 02/08/2023   ALBUMIN 2.7 (L) 02/08/2023   LABGLOB 2.3 12/29/2022   AGRATIO 1.8 12/29/2022   BILITOT 0.5 02/08/2023   ALKPHOS 42 02/08/2023   AST 13 (L) 02/08/2023   ALT 13 02/08/2023   ANIONGAP 9 02/12/2023    CBG (last 3)  Recent Labs    02/11/23 1608 02/11/23 2118 02/12/23 0730  GLUCAP 168* 204* 147*       Coagulation Profile: No results for input(s): "INR", "PROTIME" in the last 168 hours.   Radiology Studies: No results found.     Kathlen Mody M.D. [facility] 02/12/2023, 10:02 AM  Available via Epic secure chat 7am-7pm After 7 pm, please refer to night coverage provider listed on amion.

## 2023-02-13 ENCOUNTER — Encounter (HOSPITAL_COMMUNITY): Payer: Self-pay | Admitting: Gastroenterology

## 2023-02-13 ENCOUNTER — Inpatient Hospital Stay (HOSPITAL_COMMUNITY): Payer: PPO | Admitting: Anesthesiology

## 2023-02-13 ENCOUNTER — Encounter (HOSPITAL_COMMUNITY)
Admission: EM | Disposition: A | Discharge status: Home / Self Care | Payer: Self-pay | Source: Home / Self Care | Attending: Internal Medicine

## 2023-02-13 DIAGNOSIS — I2510 Atherosclerotic heart disease of native coronary artery without angina pectoris: Secondary | ICD-10-CM | POA: Diagnosis not present

## 2023-02-13 DIAGNOSIS — I252 Old myocardial infarction: Secondary | ICD-10-CM

## 2023-02-13 DIAGNOSIS — K862 Cyst of pancreas: Secondary | ICD-10-CM | POA: Diagnosis not present

## 2023-02-13 DIAGNOSIS — E1159 Type 2 diabetes mellitus with other circulatory complications: Secondary | ICD-10-CM | POA: Diagnosis not present

## 2023-02-13 DIAGNOSIS — I10 Essential (primary) hypertension: Secondary | ICD-10-CM

## 2023-02-13 DIAGNOSIS — K3189 Other diseases of stomach and duodenum: Secondary | ICD-10-CM

## 2023-02-13 DIAGNOSIS — K2289 Other specified disease of esophagus: Secondary | ICD-10-CM

## 2023-02-13 DIAGNOSIS — I714 Abdominal aortic aneurysm, without rupture, unspecified: Secondary | ICD-10-CM | POA: Diagnosis not present

## 2023-02-13 DIAGNOSIS — R079 Chest pain, unspecified: Secondary | ICD-10-CM | POA: Diagnosis not present

## 2023-02-13 DIAGNOSIS — F17210 Nicotine dependence, cigarettes, uncomplicated: Secondary | ICD-10-CM

## 2023-02-13 HISTORY — PX: CYST ENTEROSTOMY: SHX6861

## 2023-02-13 HISTORY — PX: EUS: SHX5427

## 2023-02-13 HISTORY — PX: ESOPHAGOGASTRODUODENOSCOPY (EGD) WITH PROPOFOL: SHX5813

## 2023-02-13 HISTORY — PX: PANCREATIC STENT PLACEMENT: SHX5539

## 2023-02-13 LAB — CBC
HCT: 42.8 % (ref 39.0–52.0)
Hemoglobin: 14 g/dL (ref 13.0–17.0)
MCH: 28.5 pg (ref 26.0–34.0)
MCHC: 32.7 g/dL (ref 30.0–36.0)
MCV: 87.2 fL (ref 80.0–100.0)
Platelets: 162 10*3/uL (ref 150–400)
RBC: 4.91 MIL/uL (ref 4.22–5.81)
RDW: 13.9 % (ref 11.5–15.5)
WBC: 6.2 10*3/uL (ref 4.0–10.5)
nRBC: 0 % (ref 0.0–0.2)

## 2023-02-13 LAB — GLUCOSE, CAPILLARY
Glucose-Capillary: 178 mg/dL — ABNORMAL HIGH (ref 70–99)
Glucose-Capillary: 141 mg/dL — ABNORMAL HIGH (ref 70–99)
Glucose-Capillary: 141 mg/dL — ABNORMAL HIGH (ref 70–99)
Glucose-Capillary: 162 mg/dL — ABNORMAL HIGH (ref 70–99)
Glucose-Capillary: 294 mg/dL — ABNORMAL HIGH (ref 70–99)

## 2023-02-13 LAB — BASIC METABOLIC PANEL
Anion gap: 11 (ref 5–15)
BUN: 26 mg/dL — ABNORMAL HIGH (ref 8–23)
CO2: 24 mmol/L (ref 22–32)
Calcium: 9.1 mg/dL (ref 8.9–10.3)
Chloride: 98 mmol/L (ref 98–111)
Creatinine, Ser: 1.67 mg/dL — ABNORMAL HIGH (ref 0.61–1.24)
GFR, Estimated: 46 mL/min — ABNORMAL LOW (ref >60)
Glucose, Bld: 186 mg/dL — ABNORMAL HIGH (ref 70–99)
Potassium: 3.8 mmol/L (ref 3.5–5.1)
Sodium: 133 mmol/L — ABNORMAL LOW (ref 135–145)

## 2023-02-13 LAB — SURGICAL PATHOLOGY

## 2023-02-13 SURGERY — ESOPHAGOGASTRODUODENOSCOPY (EGD) WITH PROPOFOL
Anesthesia: Monitor Anesthesia Care

## 2023-02-13 MED ORDER — CIPROFLOXACIN IN D5W 400 MG/200ML IV SOLN
INTRAVENOUS | Status: DC | PRN
  Administered 2023-02-13: 400 mg via INTRAVENOUS

## 2023-02-13 MED ORDER — LIDOCAINE 2% (20 MG/ML) 5 ML SYRINGE
INTRAMUSCULAR | Status: DC | PRN
  Administered 2023-02-13: 40 mg via INTRAVENOUS

## 2023-02-13 MED ORDER — DEXAMETHASONE SODIUM PHOSPHATE 10 MG/ML IJ SOLN
INTRAMUSCULAR | Status: DC | PRN
  Administered 2023-02-13: 5 mg via INTRAVENOUS

## 2023-02-13 MED ORDER — ONDANSETRON HCL 4 MG/2ML IJ SOLN
INTRAMUSCULAR | Status: DC | PRN
  Administered 2023-02-13: 4 mg via INTRAVENOUS

## 2023-02-13 MED ORDER — PROPOFOL 10 MG/ML IV BOLUS
INTRAVENOUS | Status: DC | PRN
  Administered 2023-02-13: 100 mg via INTRAVENOUS

## 2023-02-13 MED ORDER — CIPROFLOXACIN HCL 500 MG PO TABS
500.0000 mg | ORAL_TABLET | Freq: Two times a day (BID) | ORAL | Status: DC
  Administered 2023-02-13 – 2023-02-15 (×4): 500 mg via ORAL
  Filled 2023-02-13 (×4): qty 1

## 2023-02-13 MED ORDER — SUGAMMADEX SODIUM 200 MG/2ML IV SOLN
INTRAVENOUS | Status: DC | PRN
  Administered 2023-02-13: 360 mg via INTRAVENOUS

## 2023-02-13 MED ORDER — EPHEDRINE SULFATE-NACL 50-0.9 MG/10ML-% IV SOSY
PREFILLED_SYRINGE | INTRAVENOUS | Status: DC | PRN
  Administered 2023-02-13: 5 mg via INTRAVENOUS

## 2023-02-13 MED ORDER — PROPOFOL 500 MG/50ML IV EMUL
INTRAVENOUS | Status: DC | PRN
  Administered 2023-02-13: 100 ug/kg/min via INTRAVENOUS

## 2023-02-13 MED ORDER — ENSURE MAX PROTEIN PO LIQD
11.0000 [oz_av] | Freq: Two times a day (BID) | ORAL | Status: DC
  Administered 2023-02-13 – 2023-02-15 (×4): 11 [oz_av] via ORAL
  Filled 2023-02-13 (×7): qty 330

## 2023-02-13 MED ORDER — ROCURONIUM BROMIDE 10 MG/ML (PF) SYRINGE
PREFILLED_SYRINGE | INTRAVENOUS | Status: DC | PRN
  Administered 2023-02-13: 20 mg via INTRAVENOUS
  Administered 2023-02-13: 40 mg via INTRAVENOUS

## 2023-02-13 MED ORDER — ADULT MULTIVITAMIN W/MINERALS CH
1.0000 | ORAL_TABLET | Freq: Every day | ORAL | Status: DC
  Administered 2023-02-13 – 2023-02-15 (×3): 1 via ORAL
  Filled 2023-02-13 (×3): qty 1

## 2023-02-13 MED ORDER — LACTATED RINGERS IV SOLN: INTRAVENOUS | Status: DC

## 2023-02-13 MED ORDER — CIPROFLOXACIN IN D5W 400 MG/200ML IV SOLN
INTRAVENOUS | Status: AC
  Filled 2023-02-13: qty 200

## 2023-02-13 SURGICAL SUPPLY — 15 items

## 2023-02-13 NOTE — Anesthesia Preprocedure Evaluation (Signed)
Anesthesia Evaluation  Patient identified by MRN, date of birth, ID band Patient awake    Reviewed: Allergy & Precautions, NPO status , Patient's Chart, lab work & pertinent test results, reviewed documented beta blocker date and time   Airway Mallampati: II  TM Distance: >3 FB Neck ROM: Full    Dental  (+) Missing, Loose, Dental Advisory Given, Poor Dentition   Pulmonary sleep apnea , Current Smoker and Patient abstained from smoking.   Pulmonary exam normal breath sounds clear to auscultation       Cardiovascular hypertension, Pt. on medications and Pt. on home beta blockers + CAD and + Past MI  Normal cardiovascular exam Rhythm:Regular Rate:Normal  Echo 01/2023  1. Left ventricular ejection fraction, by estimation, is 55 to 60%. Left ventricular ejection fraction by PLAX is 58 %. The left ventricle has normal function. The left ventricle has no regional wall motion abnormalities. codominant mitral inflow, suggesting borderline diastolic dysfunciton.   2. Right ventricular systolic function is normal. The right ventricular size is normal. Tricuspid regurgitation signal is inadequate for assessing PA pressure.   3. The mitral valve is grossly normal. Trivial mitral valve regurgitation.   4. The aortic valve is tricuspid. Aortic valve regurgitation is not visualized.   5. The inferior vena cava is normal in size with greater than 50% respiratory variability, suggesting right atrial pressure of 3 mmHg.   Comparison(s): No prior Echocardiogram.    LHC 01/2023  Prox RCA to Mid RCA lesion is 100% stenosed.   Prox LAD lesion is 40% stenosed.   Prox Cx to Dist Cx lesion is 20% stenosed.   The left ventricular systolic function is normal.   Mild proximal LAD stenosis Mild plaque through the mid Circumflex.  The large dominant RCA is occluded in the mid stented segment. This appears chronic. Mid and distal RCA fills from left to right  collaterals.  Normal LV systolic function LVEDP 6 mmHg   Recommendations: Non-cardiac chest pain. Medical management of CAD.       Neuro/Psych  negative psych ROS   GI/Hepatic Neg liver ROS,GERD  Medicated,,  Endo/Other  diabetes    Renal/GU Renal disease     Musculoskeletal negative musculoskeletal ROS (+)    Abdominal   Peds  Hematology negative hematology ROS (+)   Anesthesia Other Findings   Reproductive/Obstetrics                             Anesthesia Physical Anesthesia Plan  ASA: 3  Anesthesia Plan: MAC   Post-op Pain Management: Minimal or no pain anticipated   Induction: Intravenous  PONV Risk Score and Plan: 0 and TIVA, Propofol infusion, Treatment may vary due to age or medical condition and Ondansetron  Airway Management Planned: Natural Airway and Nasal Cannula  Additional Equipment:   Intra-op Plan:   Post-operative Plan:   Informed Consent: I have reviewed the patients History and Physical, chart, labs and discussed the procedure including the risks, benefits and alternatives for the proposed anesthesia with the patient or authorized representative who has indicated his/her understanding and acceptance.     Dental advisory given  Plan Discussed with: CRNA  Anesthesia Plan Comments:         Anesthesia Quick Evaluation

## 2023-02-13 NOTE — Progress Notes (Signed)
Initial Nutrition Assessment  DOCUMENTATION CODES:   Not applicable  INTERVENTION:  Continue regular diet when clinically appropriate  Trial Ensure Max po BID, each supplement provides 150 kcal and 30 grams of protein (suitable for pancreatitis)  MVI   NUTRITION DIAGNOSIS:   Inadequate oral intake related to inability to eat as evidenced by NPO status, percent weight loss.   GOAL:   Patient will meet greater than or equal to 90% of their needs   MONITOR:   PO intake, Labs, I & O's, Supplement acceptance, Weight trends, Diet advancement  REASON FOR ASSESSMENT:   Malnutrition Screening Tool    ASSESSMENT:    62 y.o. male with PMHx including adenomatous colon polyps, GERD, gastritis, duodenal ulcer in the setting of NSAIDs, pancreatitis in April 2023 ( ? Secondary to hypertriglyceridemia), CAD s/p stent, AAA s/p repair, DM, HTN, HLD presents with epigastric and chest pain  S/p EGD and EUS  S/p L heart cath and angiography  Large pancreatic pseudocysyst with symptomatic compression of gastric lumen and recurrent acute pancreatitis   Lipase and amylase are WDL   Postprandial pain/discomfort   Plans for OP eval of cirrhosis   Labs: Na 133, Glu 186, BUN 26, Cr 1.67  Meds: pepcid, insulin, protonix, crestor  Wt: 6.7 kg (7%) wt loss x 1 month  Po: 100% meal intake x last 3 documented meals  I/O's: -1.2 L   Patient in ENDO at time of visit.   NUTRITION - FOCUSED PHYSICAL EXAM:  Will re-attempt on follow up. Patient OTF   Diet Order:   Diet Order             Diet NPO time specified  Diet effective midnight                   EDUCATION NEEDS:   Not appropriate for education at this time  Skin:  Skin Assessment: Reviewed RN Assessment  Last BM:  4/18  Height:   Ht Readings from Last 1 Encounters:  02/13/23  (1.753 m)    Weight:   Wt Readings from Last 1 Encounters:  02/13/23 91.3 kg     BMI:  Body mass index is 29.72  kg/m.  Estimated Nutritional Needs:   Kcal:  1800-2100 kcal  Protein:  90-110 g  Fluid:  >/= 2L    Leodis Rains, RDN, LDN  Clinical Nutrition

## 2023-02-13 NOTE — Progress Notes (Signed)
[name], is a 62 y.o. male, DOB - February 17, 1961, ZOX:[id number] Admit date - 02/06/2023    Outpatient Primary MD for the patient is Clayton Peterson, Clayton Child, FNP  LOS - 6  days    Brief summary    Clayton Peterson is a 62 y.o. male with medical history significant of CAD s/p stent, AAA s/p repair, DM, HTN, and HLD presenting with chest pain.  He reports prior h/o MI but last cath was in 2012.  The night prior to admission,  he awoke from sleep about 0200 with severe chest pain.  He has a  H/o AAA repair, prior MI with stent.  Trop neg.  ECG unchanged.  CTA neg for dissection and no PE. Cardiology consulted and he underwent cath without any intervention. Gi consulted in view of his h/o PUD, was found to have a large pancreatic pseudocyst compressing on the cardia of the stomach. He was scheduled for EUS on 02/13/23.   Assessment & Plan    Assessment and Plan:  Intermittent chest pain   Resolved.  EKG does not show ischemic changes.  Troponins are negative.  In view of his CAD (s/p DES to [facility]), ruptured AAA s/p repair 2009 , underwent cardiac cath, did not show any blockages . He has chronic RCA occlusion with collaterals. He has patent LAD and circumflex.  Meanwhile continue with Aspirin, Metoprolol 50 mg BID.  and crestor 40 mg daily and fenofibrate 160 mg daily. Echocardiogram ordered and reviewed with the patient.    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  Last EGD in 8/22 showing gastritis and non bleeding duodenal ulcer.  Pepcid added. Liver panl wnl. Lipase and amylase wnl.  He underwent EGD  on 4/19 showing a single large sub epithelial mass causing extrinsic compression in the gastric fundus possibly due to large pseudocyst of the pancreas noted on CTA. Two non bleeding gastric  ulcers with clear ulcer base were found in the gastric fundus along the sub epithelial mass. Biopsies taken for H Pylori testing.  He underwent Pancreas CT on 4/19  showing Considerable interval enlargement of chronic pancreatic pseudocyst in the distal body and tail of the pancreas, extending into the retroperitoneal area and  extension into the gastrosplenic ligament making broad contact with the undersurface of the body of the stomach.  Patient underwent EUS today , was found to have pancreatic pseudocyst  with walled off necrosis, with compression on the cardia of the stomach. He also underwent cystogastrostomy and partial necrosectomy done. Subsequent double pig tail placed through the AXIOS stent. As per GI he will probably need necrosectomy in the next 1 to 2 weeks.  Repeat CT abdomen in 1 to 2 weeks. He will need to continue with ciprofloxacin BID until secrosectomy vs until he gets the CT done.    Hypertension:  BP parameters are well controlled.   Insulin dependent DM type 2:  CBG (last 3)  Recent Labs  02/13/23 0721 02/13/23 1120 02/13/23 1355  GLUCAP 178* 141* 141*    Resume Semglee and SSI.  A1c is 8.7%. on 12/2022.  No changes in meds.     Mild leukocytosis  Resolved.    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 on gentle hydration.  Continue to monitor.    Mild hyponatremia:  Monitor.     Estimated body mass index is 29.72 kg/m as calculated from the following:   Height as of this encounter: 5\' 9"  (1.753 m).   Weight as of this encounter: 91.3 kg.  Code Status: full code.  DVT Prophylaxis:  SCD'S  Level of Care: Level of care: Telemetry Cardiac Family Communication: none at bedside.   Disposition Plan:     Remains inpatient appropriate:  EUS today.   Procedures:  Cardiac catheterization.  EGD EUS on 02/13/23 Consultants:   Cardiology GAstroenterology.   Antimicrobials:   Anti-infectives (From admission,  onward)    Start     Dose/Rate Route Frequency Ordered Stop   02/13/23 2000  ciprofloxacin (CIPRO) tablet 500 mg        500 mg Oral 2 times daily 02/13/23 1358          Medications  Scheduled Meds:  aspirin EC  81 mg Oral Daily   ciprofloxacin  500 mg Oral BID   famotidine  20 mg Oral BID   fenofibrate  160 mg Oral Daily   gabapentin  100 mg Oral Daily   insulin aspart  0-15 Units Subcutaneous TID WC   insulin aspart  0-5 Units Subcutaneous QHS   insulin glargine-yfgn  30 Units Subcutaneous Daily   metoprolol tartrate  50 mg Oral BID   multivitamin with minerals  1 tablet Oral Daily   nicotine  21 mg Transdermal Daily   omega-3 acid ethyl esters  2 g Oral BID   Ensure Max Protein  11 oz Oral BID   rosuvastatin  40 mg Oral Daily   sodium chloride flush  3 mL Intravenous Q12H   sodium chloride flush  3 mL Intravenous Q12H   Continuous Infusions:  sodium chloride     sodium chloride     sodium chloride 50 mL/hr at 02/10/23 1606   PRN Meds:.sodium chloride, sodium chloride, acetaminophen, ondansetron (ZOFRAN) IV, sodium chloride flush, sodium chloride flush    Subjective:   Clayton Peterson was seen and examined today.  No new complaints this morning.    Objective:   Vitals:   02/13/23 1103 02/13/23 1340 02/13/23 1350 02/13/23 1400  BP: (!) 143/82 122/75 108/68 116/65  Pulse: (!) 46 (!) 50 (!) 52 (!) 52  Resp: 16 14 17 11   Temp: 98.4 F (36.9 C) (!) 96.8 F (36 C)    TempSrc: Oral Temporal    SpO2: 94% 96% 98% 93%  Weight: 91.3 kg     Height: 5\' 9"  (1.753 m)       Intake/Output Summary (Last 24 hours) at 02/13/2023 1500 Last data filed at 02/13/2023 1333 Gross per 24 hour  Intake 740 ml  Output --  Net 740 ml    Filed Weights   02/06/23 1500 02/06/23 2258 02/13/23 1103  Weight: 94.3 kg 91.3 kg 91.3 kg     Exam. General exam: Appears calm and comfortable  Respiratory system: Clear to auscultation. Respiratory effort normal. Cardiovascular system: S1  & S2 heard, RRR. No JVD, murmurs, rubs, gallops or clicks. No pedal edema. Gastrointestinal system: Abdomen is nondistended, soft  and nontender.  Central nervous system: Alert and oriented. No focal neurological deficits. Extremities: Symmetric 5 x 5 power. Skin: No rashes, lesions or ulcers Psychiatry: Judgement and insight appear normal. Mood & affect appropriate.           Data Reviewed:  I have personally reviewed following labs and imaging studies   CBC Lab Results  Component Value Date   WBC 6.2 02/13/2023   RBC 4.91 02/13/2023   HGB 14.0 02/13/2023   HCT 42.8 02/13/2023   MCV 87.2 02/13/2023   MCH 28.5 02/13/2023   PLT 162 02/13/2023   MCHC 32.7 02/13/2023   RDW 13.9 02/13/2023   LYMPHSABS 1.1 12/29/2022   MONOABS 0.9 02/17/2022   EOSABS 0.1 12/29/2022   BASOSABS 0.1 12/29/2022     Last metabolic panel Lab Results  Component Value Date   NA 133 (L) 02/13/2023   K 3.8 02/13/2023   CL 98 02/13/2023   CO2 24 02/13/2023   BUN 26 (H) 02/13/2023   CREATININE 1.67 (H) 02/13/2023   GLUCOSE 186 (H) 02/13/2023   GFRNONAA 46 (L) 02/13/2023   CALCIUM 9.1 02/13/2023   PHOS 3.1 02/14/2022   PROT 5.7 (L) 02/08/2023   ALBUMIN 2.7 (L) 02/08/2023   LABGLOB 2.3 12/29/2022   AGRATIO 1.8 12/29/2022   BILITOT 0.5 02/08/2023   ALKPHOS 42 02/08/2023   AST 13 (L) 02/08/2023   ALT 13 02/08/2023   ANIONGAP 11 02/13/2023    CBG (last 3)  Recent Labs    02/13/23 0721 02/13/23 1120 02/13/23 1355  GLUCAP 178* 141* 141*       Coagulation Profile: No results for input(s): "INR", "PROTIME" in the last 168 hours.   Radiology Studies: No results found.     Kathlen Mody M.D. [facility] 02/13/2023, 3:00 PM  Available via Epic secure chat 7am-7pm After 7 pm, please refer to night coverage provider listed on amion.

## 2023-02-13 NOTE — Plan of Care (Signed)

## 2023-02-13 NOTE — TOC Initial Note (Signed)
Transition of Care [facility]) - Initial/Assessment Note    Patient Details  Name: Clayton Peterson MRN: [id number] Date of Birth: Oct 07, 1961  [facility] CM/SW Contact:    Gala Lewandowsky, RN Phone Number: 02/13/2023, 3:25 PM  Clinical Narrative:   Patient was discussed in progression rounds. Per MD notes plan for cystogastrostomy. Case Manager will continue to follow for transition of care needs as the patient progresses.                Expected Discharge Plan: Home/Self Care Barriers to Discharge: Continued Medical Work up   Patient Goals and CMS Choice Patient states their goals for this hospitalization and ongoing recovery are:: to return home.  Expected Discharge Plan and Services     Post Acute Care Choice: NA Living arrangements for the past 2 months: Single Family Home   Prior Living Arrangements/Services Living arrangements for the past 2 months: Single Family Home Lives with:: Spouse Patient language and need for interpreter reviewed:: Yes Do you feel safe going back to the place where you live?: Yes      Need for Family Participation in Patient Care: Yes (Comment) Care giver support system in place?: Yes (comment)   Criminal Activity/Legal Involvement Pertinent to Current Situation/Hospitalization: No - Comment as needed  Activities of Daily Living Home Assistive Devices/Equipment: None ADL Screening (condition at time of admission) Patient's cognitive ability adequate to safely complete daily activities?: Yes Is the patient deaf or have difficulty hearing?: No Does the patient have difficulty seeing, even when wearing glasses/contacts?: No Does the patient have difficulty concentrating, remembering, or making decisions?: No Patient able to express need for assistance with ADLs?: Yes Does the patient have difficulty dressing or bathing?: No Independently performs ADLs?: Yes (appropriate for developmental age) Does the patient have difficulty  walking or climbing stairs?: Yes Weakness of Legs: Both Weakness of Arms/Hands: None  Permission Sought/Granted Permission sought to share information with : Family Supports, Case Manager   Emotional Assessment       Orientation: : Oriented to Self, Oriented to Place, Oriented to  Time, Oriented to Situation Alcohol / Substance Use: Not Applicable Psych Involvement: No (comment)  Admission diagnosis:  Metabolic acidosis [E87.20] Renal insufficiency [N28.9] Chest pain [R07.9] Nonspecific chest pain [R07.9] Patient Active Problem List   Diagnosis Date Noted   Gastric ulcer without hemorrhage or perforation 02/09/2023   Gastroesophageal reflux disease 02/08/2023   Abdominal pain, epigastric 02/08/2023   History of ulcer disease 02/08/2023   Chest pain 02/07/2023   AAA (abdominal aortic aneurysm) without rupture 02/06/2023   Chronic chest pain with high risk for coronary artery disease 12/28/2022   Abdominal pain 11/03/2022   Pancreatic pseudocyst 11/03/2022   Heartburn 05/03/2022   History of acute pancreatitis 05/03/2022   Mixed hyperlipidemia 02/14/2022   DM type 2 causing vascular disease 02/14/2022   Chronic kidney disease, stage 3b 02/14/2022   Acute pancreatitis 02/13/2022   Essential hypertension, benign    Coronary artery disease    AKI (acute kidney injury) 11/13/2019   Gastroparesis 11/14/2018   Proteinuria due to type 2 diabetes mellitus 11/14/2018   OSA on CPAP 05/08/2017   Partial small bowel obstruction 01/07/2017   HNP (herniated nucleus pulposus), lumbar 06/13/2016   Lumbar spinal stenosis 06/12/2016   Lumbar radiculitis 04/06/2016   Trigger middle finger of right hand 02/23/2015   Cigarette nicotine dependence without complication 08/05/2014   Class 3 severe obesity due to excess calories without serious comorbidity with body mass  index (BMI) of 40.0 to 44.9 in adult 08/05/2014   Hydronephrosis with renal and ureteral calculous obstruction 08/05/2014    PCP:  Rica Records, FNP Pharmacy:   [facility] [address], [place] - [address] [place] [address] Phone: [phone] Fax: [phone]  Social Determinants of Health (SDOH) Social History: SDOH Screenings   Food Insecurity: No Food Insecurity (02/06/2023)  Housing: Low Risk  (02/06/2023)  Transportation Needs: No Transportation Needs (02/06/2023)  Utilities: Not At Risk (02/06/2023)  Depression (PHQ2-9): Low Risk  (12/28/2022)  Tobacco Use: High Risk (02/13/2023)   Readmission Risk Interventions     No data to display

## 2023-02-13 NOTE — Anesthesia Procedure Notes (Signed)
Procedure Name: Intubation Date/Time: 02/13/2023 12:34 PM  Performed by: De Nurse, CRNAPre-anesthesia Checklist: Patient identified, Emergency Drugs available, Suction available and Patient being monitored Patient Re-evaluated:Patient Re-evaluated prior to induction Oxygen Delivery Method: Circle System Utilized Preoxygenation: Pre-oxygenation with 100% oxygen Induction Type: IV induction Ventilation: Mask ventilation without difficulty Laryngoscope Size: Mac and 4 Grade View: Grade I Tube type: Oral Tube size: 7.5 mm Number of attempts: 1 Airway Equipment and Method: Stylet and Oral airway Placement Confirmation: ETT inserted through vocal cords under direct vision, positive ETCO2 and breath sounds checked- equal and bilateral Secured at: 22 cm Tube secured with: Tape Dental Injury: Teeth and Oropharynx as per pre-operative assessment

## 2023-02-13 NOTE — Transfer of Care (Signed)
Immediate Anesthesia Transfer of Care Note  Patient: Media planner  Procedure(s) Performed: ESOPHAGOGASTRODUODENOSCOPY (EGD) WITH PROPOFOL UPPER ENDOSCOPIC ULTRASOUND (EUS) LINEAR CYST GASTROSTOMY BILIARY DILITATION PANCREATIC STENT PLACEMENT  Patient Location: Endoscopy Unit  Anesthesia Type:General  Level of Consciousness: awake and oriented  Airway & Oxygen Therapy: Patient Spontanous Breathing  Post-op Assessment: Report given to RN  Post vital signs: Reviewed and stable  Last Vitals:  Vitals Value Taken Time  BP 140/68   Temp    Pulse    Resp    SpO2 93     Last Pain:  Vitals:   02/13/23 1103  TempSrc: Oral  PainSc: 2       Patients Stated Pain Goal: 0 (02/13/23 0815)  Complications: No notable events documented.

## 2023-02-13 NOTE — Anesthesia Postprocedure Evaluation (Signed)
Anesthesia Post Note  Patient: Media planner  Procedure(s) Performed: ESOPHAGOGASTRODUODENOSCOPY (EGD) WITH PROPOFOL BIOPSY     Patient location during evaluation: PACU Anesthesia Type: MAC Level of consciousness: awake and alert Pain management: pain level controlled Vital Signs Assessment: post-procedure vital signs reviewed and stable Respiratory status: spontaneous breathing Cardiovascular status: stable Anesthetic complications: no   No notable events documented.  Last Vitals:  Vitals:   02/13/23 0517 02/13/23 0721  BP: (!) 107/57 108/65  Pulse: (!) 48 (!) 50  Resp: 19 20  Temp: 36.7 C 37 C  SpO2: 99% 96%    Last Pain:  Vitals:   02/13/23 0721  TempSrc: Oral  PainSc:                  Lewie Loron

## 2023-02-13 NOTE — Interval H&P Note (Signed)
History and Physical Interval Note:  02/13/2023 12:08 PM  [name]  has presented today for surgery, with the diagnosis of pancreatic cyst.  The various methods of treatment have been discussed with the patient and family. After consideration of risks, benefits and other options for treatment, the patient has consented to  Procedure(s): ESOPHAGOGASTRODUODENOSCOPY (EGD) WITH PROPOFOL (N/A) UPPER ENDOSCOPIC ULTRASOUND (EUS) LINEAR (N/A) CYST GASTROSTOMY (N/A) as a surgical intervention.  The patient's history has been reviewed, patient examined, no change in status, stable for surgery.  I have reviewed the patient's chart and labs.  Questions were answered to the patient's satisfaction.    The risks of an EUS, including intestinal perforation, bleeding, infection, aspiration, and medication effects were discussed.  When a cystgastrostomy/cystenterostomy is performed as part of the EUS, there is an additional risk of pancreatitis at the rate of about 1-2%.  It was explained that procedure related pancreatitis is typically mild, although at times it can be severe and even life threatening.  The risks and benefits of endoscopic evaluation were discussed with the patient; these include but are not limited to the risk of perforation, infection, bleeding, missed lesions, lack of diagnosis, severe illness requiring hospitalization, as well as anesthesia and sedation related illnesses.  The patient and/or family is agreeable to proceed.    [employer]

## 2023-02-13 NOTE — Op Note (Signed)
[facility] Patient Name: Clayton Peterson Procedure Date : 02/13/2023 MRN: [id number] Attending MD: Corliss Parish , MD, [id number] Date of Birth: Jan 25, 1961 CSN: [id number] Age: 62 Admit Type: Inpatient Procedure:                Upper EUS Indications:              Epigastric abdominal pain, Epigastric abdominal                            distress, For cyst enterostomy, For necrosectomy Providers:                Corliss Parish, MD, Vicki Mallet, RN, Marja Kays, Technician Referring MD:             Inpatient GI Team Medicines:                General Anesthesia, Cipro 400 mg IV Complications:            No immediate complications. Estimated Blood Loss:     Estimated blood loss was minimal. Procedure:                Pre-Anesthesia Assessment:                           - Prior to the procedure, a History and Physical                            was performed, and patient medications and                            allergies were reviewed. The patient's tolerance of                            previous anesthesia was also reviewed. The risks                            and benefits of the procedure and the sedation                            options and risks were discussed with the patient.                            All questions were answered, and informed consent                            was obtained. Prior Anticoagulants: The patient has                            taken no anticoagulant or antiplatelet agents. ASA                            Grade Assessment: III - A patient with severe  systemic disease. After reviewing the risks and                            benefits, the patient was deemed in satisfactory                            condition to undergo the procedure.                           After obtaining informed consent, the endoscope was                            passed under direct vision.  Throughout the                            procedure, the patient's blood pressure, pulse, and                            oxygen saturations were monitored continuously. The                            [id number] ([id number] Olympus endoscope was                            introduced through the mouth, and advanced to the                            second part of duodenum. The [id number] ([id number])                            Olympus duodenoscope was introduced through the                            mouth, and advanced to the area of papilla. After                            obtaining informed consent, the endoscope was                            passed under direct vision. Throughout the                            procedure, the patient's blood pressure, pulse, and                            oxygen saturations were monitored continuously. The                            [id number] ([id number]) Olympus linear ultrasound scope                            was introduced through the mouth, and advanced to  the stomach for ultrasound examination. The upper                            EUS was accomplished without difficulty. The                            patient tolerated the procedure. Scope In: Scope Out: Findings:      ENDOSCOPIC FINDING: :      No gross lesions were noted in the entire esophagus.      The Z-line was irregular and was found 41 cm from the incisors.      Extrinsic compression on the stomach was found in the cardia.      Patchy mildly erythematous mucosa was found in the entire examined       stomach.      No gross lesions were noted in the duodenal bulb, in the first portion       of the duodenum and in the second portion of the duodenum.      The major papilla was normal.      ENDOSONOGRAPHIC FINDING: :      The region of the celiac plexus and celiac ganglia was visualized.      Endosonographic imaging in the visualized portion of the liver showed no        mass.      An anechoic and shadowing lesion suggestive of a cyst was identified in       the pancreatic body/tail. It is not in obvious communication with the       pancreatic duct. The lesion measured 80 mm by 72 mm in maximal       cross-sectional diameter. There was a single compartment thinly       septated. The outer wall of the lesion was thin. There was no associated       mass. There was internal debris within the fluid-filled cavity. The       decision was made to create a cystogastrostomy using the AXIOS stent       system. Once an appropriate position in the stomach was identified, the       common wall between the stomach and the cyst was interrogated utilizing       color Doppler imaging to identify interposed vessels. The AXIOS stent       and electrocautery device were introduced through the working channel.       The AXIOS catheter was advanced to the common wall between the stomach       and the cyst. Current was applied to the cautery tip and then the       catheter was advanced into the cyst. A 20 x 10 mm AXIOS stent was placed       with the flanges in close approximation to the walls of the cyst and the       stomach through the cystogastrostomy. The stent was successfully placed.       The cyst was partially filled with black necrotic tissue that was pasty       and adherent to the cyst wall. Beginning of necrosectomy was performed       with a rat-toothed forceps, requiring multiple intubations of the cyst.       It is felt that only approximately 30 to 35% of the cyst had necrosis       still remaining.  Thus decision was made to proceed with placement of 2 5       cm 7 Fr double pigtail stents was placed into the pseudocyst through the       cystogastrostomy using a stent introducer set. The stent was       successfully placed. Impression:               EGD Impression:                           - No gross lesions in the entire esophagus. Z-line                             irregular, 41 cm from the incisors.                           - Extrinsic compression in the cardia.                           - Erythematous mucosa in the stomach.                           - No gross lesions in the duodenal bulb, in the                            first portion of the duodenum and in the second                            portion of the duodenum.                           - Normal major papilla.                           EUS Impression:                           - A cystic lesion was seen in the pancreatic body                            and pancreatic tail. Tissue has not been obtained.                            However, the endosonographic appearance is                            consistent with a pancreatic pseudocyst with walled                            off necrosis. Cystogastrostomy was performed with                            AXIOS stent. Necrosectomy was partially performed.                            Subsequent double pigtail placed through the  AXIOS                            cystgastrostomy. Recommendation:           - The patient will be observed post-procedure,                            until all discharge criteria are met.                           - Return patient to hospital ward for ongoing care.                           - Stop PPI if able to tolerate so that we allow                            gastric acid to enter the cyst cavity and work on                            it.                           - Depending on how the patient is doing, he may                            need necrosectomy in the next 1 to 2 weeks. If he                            is doing well then we can hold on that.                           - Repeat CT abdomen in 1.5-2 weeks.                           - Continue ciprofloxacin or Augmentin twice daily                            until necrosectomy versus CT scan.                           - If issues arise with progressive  pain/discomfort                            then would recommend updated imaging.                           - The findings and recommendations were discussed                            with the patient.                           - The findings and recommendations were discussed  with the referring physician. Procedure Code(s):        --- Professional ---                           [phone], Esophagogastroduodenoscopy, flexible,                            transoral; with transmural drainage of pseudocyst                            (includes placement of transmural drainage                            catheter[s]/stent[s], when performed, and                            endoscopic ultrasound, when performed)                           43237, Esophagogastroduodenoscopy, flexible,                            transoral; with endoscopic ultrasound examination                            limited to the esophagus, stomach or duodenum, and                            adjacent structures                           48999, Unlisted procedure, pancreas Diagnosis Code(s):        --- Professional ---                           K22.89, Other specified disease of esophagus                           K31.89, Other diseases of stomach and duodenum                           K86.2, Cyst of pancreas                           R10.13, Epigastric pain CPT copyright 2022 American Medical Association. All rights reserved. The codes documented in this report are preliminary and upon coder review may  be revised to meet current compliance requirements. Corliss Parish, MD 02/13/2023 1:50:00 PM Number of Addenda: 0

## 2023-02-14 ENCOUNTER — Telehealth: Payer: Self-pay

## 2023-02-14 ENCOUNTER — Other Ambulatory Visit: Payer: Self-pay

## 2023-02-14 DIAGNOSIS — G8929 Other chronic pain: Secondary | ICD-10-CM | POA: Diagnosis not present

## 2023-02-14 DIAGNOSIS — K863 Pseudocyst of pancreas: Secondary | ICD-10-CM

## 2023-02-14 DIAGNOSIS — Z9189 Other specified personal risk factors, not elsewhere classified: Secondary | ICD-10-CM | POA: Diagnosis not present

## 2023-02-14 DIAGNOSIS — R079 Chest pain, unspecified: Secondary | ICD-10-CM | POA: Diagnosis not present

## 2023-02-14 LAB — CBC
HCT: 41.7 % (ref 39.0–52.0)
Hemoglobin: 14 g/dL (ref 13.0–17.0)
MCH: 28.5 pg (ref 26.0–34.0)
MCHC: 33.6 g/dL (ref 30.0–36.0)
MCV: 84.8 fL (ref 80.0–100.0)
Platelets: 175 10*3/uL (ref 150–400)
RBC: 4.92 MIL/uL (ref 4.22–5.81)
RDW: 13.8 % (ref 11.5–15.5)
WBC: 6.5 10*3/uL (ref 4.0–10.5)
nRBC: 0 % (ref 0.0–0.2)

## 2023-02-14 LAB — GLUCOSE, CAPILLARY
Glucose-Capillary: 194 mg/dL — ABNORMAL HIGH (ref 70–99)
Glucose-Capillary: 212 mg/dL — ABNORMAL HIGH (ref 70–99)
Glucose-Capillary: 192 mg/dL — ABNORMAL HIGH (ref 70–99)
Glucose-Capillary: 135 mg/dL — ABNORMAL HIGH (ref 70–99)

## 2023-02-14 NOTE — Plan of Care (Signed)

## 2023-02-14 NOTE — Progress Notes (Signed)
PROGRESS NOTE    Clayton Peterson  FAO:[id number] DOB: 1960-12-07 DOA: 02/06/2023 PCP: [facility], FNP   Brief Narrative:  This 62 y.o. male with medical history significant of CAD s/p stent, AAA s/p repair, DM, HTN, and HLD presenting with chest pain.  He reports prior h/o MI but last cath was in 2012.  The night prior to admission,  he awoke from sleep about 0200 with severe chest pain.  He has a  H/o AAA repair, prior MI with stent.  Trop neg.  ECG unchanged.  CTA neg for dissection and no PE. Cardiology consulted and he underwent cath without any intervention. Gi consulted in view of his h/o PUD, was found to have a large pancreatic pseudocyst compressing on the cardia of the stomach. He underwent EUS on 02/13/23.    Assessment & Plan:   Principal Problem:   Chronic chest pain with high risk for coronary artery disease Active Problems:   Mixed hyperlipidemia   DM type 2 causing vascular disease   Essential hypertension, benign   Coronary artery disease   Chronic kidney disease, stage 3b   Pancreatic pseudocyst   Cigarette nicotine dependence without complication   Class 3 severe obesity due to excess calories without serious comorbidity with body mass index (BMI) of 40.0 to 44.9 in adult   OSA on CPAP   AAA (abdominal aortic aneurysm) without rupture   Chest pain   Gastroesophageal reflux disease   Abdominal pain, epigastric   History of ulcer disease   Gastric ulcer without hemorrhage or perforation   Intermittent chest pain : Now Resolved.  EKG does not show ischemic changes.  Troponins are negative.  In view of his CAD (s/p DES to [facility] ruptured AAA s/p repair 2009 , He underwent cardiac cath, did not show any blockages .  He has chronic RCA occlusion with collaterals. He has patent LAD and circumflex.  Meanwhile continue with Aspirin, Metoprolol 50 mg BID  and crestor 40 mg daily and fenofibrate 160 mg daily. Echocardiogram ordered and reviewed with the  patient.  Cardio signed off.   Large pancreatic pseudocyst: Symptomatic compression of gastric lumen. Patient complaining of  chronic dull abdominal wall pain.  Last EGD in 8/22 showing gastritis and non bleeding duodenal ulcer.  Pepcid added. Liver panel wnl. Lipase and amylase wnl.  He underwent EGD  on 02/09/23 showing a single large sub epithelial mass causing extrinsic compression in the gastric fundus possibly due to large pseudocyst of the pancreas noted on CTA. Two non bleeding gastric ulcers with clear ulcer base were found in the gastric fundus along the sub epithelial mass. Biopsies taken for H Pylori testing.  He underwent Pancreas CT on 02/09/23  showing Considerable interval enlargement of chronic pancreatic pseudocyst in the distal body and tail of the pancreas, extending into the retroperitoneal area and  extension into the gastrosplenic ligament making broad contact with the undersurface of the body of the stomach.  Patient underwent EUS 02/13/23 , was found to have pancreatic pseudocyst  with walled off necrosis, with compression on the cardia of the stomach. He also underwent cystogastrostomy and partial necrosectomy done. Subsequent double pig tail placed through the AXIOS stent. As per GI he will probably need necrosectomy in the next 1 to 2 weeks.  Repeat CT abdomen in 1 to 2 weeks. He will need to continue with ciprofloxacin BID until necronectomy vs until he gets the CT done.  Patient reports doing better, tolerating clear liquid diet,  will advance as tolerated.  Hypertension:  BP parameters are well controlled.  Continue metoprolol 50 mg every 12 hours.   Insulin dependent DM type 2:  CBG (last 3)  Recent Labs (last 2 labs)       Recent Labs    02/13/23 0721 02/13/23 1120 02/13/23 1355  GLUCAP 178* 141* 141*       Continue Semglee and SSI.  A1c is 8.7%. on 12/2022.  No changes in meds.    Mild leukocytosis  Resolved.    Mild acute on Stage 3b CKD;   Creatinine fluctuating between 1.7 to 1.5. on gentle hydration.  Continue to monitor.     Mild hyponatremia:  Continue to monitor.      Estimated body mass index is 29.72 kg/m as calculated from the following:   Height as of this encounter:  (1.753 m).   Weight as of this encounter: 91.3 kg.   DVT prophylaxis: SCDs Code Status: Full code Family Communication: No family at bed side. Disposition Plan:  Status is: Inpatient Remains inpatient appropriate because: Admitted for intermittent chest pain found to have pancreatic pseudocyst, underwent endoscopy ultrasound.   Consultants:  Cardiology Gastroenterology  Procedures: Echo, endoscopic ultrasound Antimicrobials:   Anti-infectives (From admission, onward)    Start     Dose/Rate Route Frequency Ordered Stop   02/13/23 2000  ciprofloxacin (CIPRO) tablet 500 mg        500 mg Oral 2 times daily 02/13/23 1358         Subjective: Patient was seen and examined at bedside.  Overnight events noted.   Patient reports doing better, he is tolerating clear liquid diet.  Objective: Vitals:   02/13/23 2024 02/14/23 0412 02/14/23 0722 02/14/23 0835  BP: 110/70 (!) 115/59 118/64 111/62  Pulse: (!) 56 (!) 48 (!) 50 (!) 54  Resp:   18   Temp: 98.5 F (36.9 C) 97.9 F (36.6 C) 97.9 F (36.6 C)   TempSrc: Oral Oral Oral   SpO2: 93% 97% 96%   Weight:      Height:        Intake/Output Summary (Last 24 hours) at 02/14/2023 1426 Last data filed at 02/14/2023 0830 Gross per 24 hour  Intake 840 ml  Output --  Net 840 ml   Filed Weights   02/06/23 1500 02/06/23 2258 02/13/23 1103  Weight: 94.3 kg 91.3 kg 91.3 kg    Examination:  General exam: Appears calm and comfortable, not in any acute distress. Respiratory system: Clear to auscultation. Respiratory effort normal.  RR 16 Cardiovascular system: S1 & S2 heard, regular rate and rhythm, no murmur. Gastrointestinal system: Abdomen is soft, non tender, non distended,  BS+ Central nervous system: Alert and oriented x 3. No focal neurological deficits. Extremities: Symmetric 5 x 5 power. Skin: No rashes, lesions or ulcers Psychiatry: Judgement and insight appear normal. Mood & affect appropriate.     Data Reviewed: I have personally reviewed following labs and imaging studies  CBC: Recent Labs  Lab 02/10/23 0230 02/11/23 0209 02/12/23 0252 02/13/23 0329 02/14/23 0156  WBC 7.1 6.9 5.9 6.2 6.5  HGB 14.1 13.8 13.5 14.0 14.0  HCT 41.7 42.2 41.6 42.8 41.7  MCV 85.1 86.7 86.3 87.2 84.8  PLT 154 152 138* 162 175   Basic Metabolic Panel: Recent Labs  Lab 02/09/23 0244 02/10/23 0230 02/11/23 0209 02/12/23 0252 02/13/23 0329  NA 134* 131* 132* 134* 133*  K 4.1 3.9 3.9 3.8 3.8  CL 101 99 101  100 98  CO2 23 22 23 25 24   GLUCOSE 113* 119* 192* 125* 186*  BUN 20 22 24* 26* 26*  CREATININE 1.58* 1.73* 1.56* 1.74* 1.67*  CALCIUM 9.1 9.0 8.7* 8.9 9.1   GFR: Estimated Creatinine Clearance: 51.8 mL/min (A) (by C-G formula based on SCr of 1.67 mg/dL (H)). Liver Function Tests: Recent Labs  Lab 02/08/23 0202  AST 13*  ALT 13  ALKPHOS 42  BILITOT 0.5  PROT 5.7*  ALBUMIN 2.7*   Recent Labs  Lab 02/08/23 0202  LIPASE 29  AMYLASE 40   No results for input(s): "AMMONIA" in the last 168 hours. Coagulation Profile: No results for input(s): "INR", "PROTIME" in the last 168 hours. Cardiac Enzymes: No results for input(s): "CKTOTAL", "CKMB", "CKMBINDEX", "TROPONINI" in the last 168 hours. BNP (last 3 results) No results for input(s): "PROBNP" in the last 8760 hours. HbA1C: No results for input(s): "HGBA1C" in the last 72 hours. CBG: Recent Labs  Lab 02/13/23 1355 02/13/23 1538 02/13/23 2128 02/14/23 0725 02/14/23 1143  GLUCAP 141* 162* 294* 194* 212*   Lipid Profile: No results for input(s): "CHOL", "HDL", "LDLCALC", "TRIG", "CHOLHDL", "LDLDIRECT" in the last 72 hours. Thyroid Function Tests: No results for input(s): "TSH",  "T4TOTAL", "FREET4", "T3FREE", "THYROIDAB" in the last 72 hours. Anemia Panel: No results for input(s): "VITAMINB12", "FOLATE", "FERRITIN", "TIBC", "IRON", "RETICCTPCT" in the last 72 hours. Sepsis Labs: No results for input(s): "PROCALCITON", "LATICACIDVEN" in the last 168 hours.  No results found for this or any previous visit (from the past 240 hour(s)).   Radiology Studies: No results found.  Scheduled Meds:  aspirin EC  81 mg Oral Daily   ciprofloxacin  500 mg Oral BID   famotidine  20 mg Oral BID   fenofibrate  160 mg Oral Daily   gabapentin  100 mg Oral Daily   insulin aspart  0-15 Units Subcutaneous TID WC   insulin aspart  0-5 Units Subcutaneous QHS   insulin glargine-yfgn  30 Units Subcutaneous Daily   metoprolol tartrate  50 mg Oral BID   multivitamin with minerals  1 tablet Oral Daily   nicotine  21 mg Transdermal Daily   omega-3 acid ethyl esters  2 g Oral BID   Ensure Max Protein  11 oz Oral BID   rosuvastatin  40 mg Oral Daily   sodium chloride flush  3 mL Intravenous Q12H   sodium chloride flush  3 mL Intravenous Q12H   Continuous Infusions:  sodium chloride     sodium chloride     sodium chloride 50 mL/hr at 02/10/23 1606     LOS: 7 days    Time spent: 50 mins    Willeen Niece, MD [facility]   If 7PM-7AM, please contact night-coverage

## 2023-02-14 NOTE — Anesthesia Postprocedure Evaluation (Signed)
Anesthesia Post Note  Patient: Media planner  Procedure(s) Performed: ESOPHAGOGASTRODUODENOSCOPY (EGD) WITH PROPOFOL UPPER ENDOSCOPIC ULTRASOUND (EUS) LINEAR CYST GASTROSTOMY BILIARY DILITATION PANCREATIC STENT PLACEMENT     Patient location during evaluation: PACU Anesthesia Type: MAC Level of consciousness: awake and alert Pain management: pain level controlled Vital Signs Assessment: post-procedure vital signs reviewed and stable Respiratory status: spontaneous breathing, nonlabored ventilation, respiratory function stable and patient connected to nasal cannula oxygen Cardiovascular status: stable and blood pressure returned to baseline Postop Assessment: no apparent nausea or vomiting Anesthetic complications: no   No notable events documented.  Last Vitals:  Vitals:   02/13/23 2024 02/14/23 0412  BP: 110/70 (!) 115/59  Pulse: (!) 56 (!) 48  Resp:    Temp: 36.9 C 36.6 C  SpO2: 93% 97%    Last Pain:  Vitals:   02/14/23 0412  TempSrc: Oral  PainSc:                  Suni Jarnagin

## 2023-02-14 NOTE — Telephone Encounter (Signed)
EUS has been scheduled for 5/2 at 1 pm at [facility] with GM   All information has been sent to the pt [name] and will be provided at discharge   I have called and made the pt aware as well. All questions answered to the best of my ability.

## 2023-02-14 NOTE — Progress Notes (Signed)
Daily Progress Note  DOA: 02/06/2023 Hospital Day: 9 Chief Complaint: abdominal pain / pseudocyst   ASSESSMENT   62 yo male with pmh of  adenomatous colon polyps, GERD, gastritis, duodenal ulcer in the setting of NSAIDs,  pancreatitis in April 2023 ( ? Secondary to hypertriglyceridemia), AAA repair, CAD s/p remote PCI. He is followed by [facility]. Admitted here 4/16 with chest /epigastric pain. Evaluated by Cardiology, underwent cath. Pain felt to be non-cardiac and GI was consulted for epigastric pain on 4/18. EGD done and showed non-bleeding gastric ulcers. CT scan showed considerable interval progression of chronic pancreatic pseudocyst  # Symptomatic enlarging pancreatic pseudocyst cyst  s/p EUS yesterday with findings of pancreatic  pseudocyst with walled off necrosis. Cystogastrostomy was performed, necrosectomy was partially performed. Subsequent double pigtail placed through the AXIOS cystgastrostomy. -Abdominal pain better today after cyst drainage.   #Chronic splenic vein thrombosis   # ? Cirrhosis on CT scan.  If he has cirrhosis there is no evidence for decompensation   PLAN   -Continue Cipro 500 mg BID for total of one month -Our office is arranging for a follow up CT scan to be done next week -If continues to do well then can be discharged home tomorrow from GI standpoint.  -Outpatient evaluation of possible cirrhosis with primary GI , Dr. Marletta Lor   Subjective / New events:  Abdominal pain is better after cyst drainage.    Objective   EGD 4/19 - Esophagogastric landmarks identified. - Small hiatal hernia. - Normal esophagus otherwise. - A single subepithelial mass found in the gastric fundus associated with 2 non-bleeding gastric ulcers with a clean ulcer base (Forrest Class III). - Normal stomach otherwise - biopsies taken to rule out H pylori - Normal examined duodenum.  A. STOMACH, BIOPSY:  Antral and oxyntic mucosa with mild chronic inflammation.   Immunohistochemistry for Helicobacter pylori is negative.    EGD / EUS 4/23 EGD Impression: - No gross lesions in the entire esophagus. Z-line irregular, 41 cm from the incisors. - Extrinsic compression in the cardia. - Erythematous mucosa in the stomach. - No gross lesions in the duodenal bulb, in the first portion of the duodenum and in the second portion of the duodenum. - Normal major papilla.   EUS Impression: - A cystic lesion was seen in the pancreatic body and pancreatic tail. Tissue has not been obtained. However, the endosonographic appearance is consistent with a pancreatic  pseudocyst with walled off necrosis. Cystogastrostomy was performed with AXIOS stent. Necrosectomy was partially performed. Subsequent double pigtail placed through the AXIOS cystgastrostomy.   Recent Labs    02/12/23 0252 02/13/23 0329 02/14/23 0156  WBC 5.9 6.2 6.5  HGB 13.5 14.0 14.0  HCT 41.6 42.8 41.7  PLT 138* 162 175   BMET Recent Labs    02/12/23 0252 02/13/23 0329  NA 134* 133*  K 3.8 3.8  CL 100 98  CO2 25 24  GLUCOSE 125* 186*  BUN 26* 26*  CREATININE 1.74* 1.67*  CALCIUM 8.9 9.1   LFT No results for input(s): "PROT", "ALBUMIN", "AST", "ALT", "ALKPHOS", "BILITOT", "BILIDIR", "IBILI" in the last 72 hours. PT/INR No results for input(s): "LABPROT", "INR" in the last 72 hours.   Scheduled inpatient medications:   aspirin EC  81 mg Oral Daily   ciprofloxacin  500 mg Oral BID   famotidine  20 mg Oral BID   fenofibrate  160 mg Oral Daily   gabapentin  100 mg Oral Daily  insulin aspart  0-15 Units Subcutaneous TID WC   insulin aspart  0-5 Units Subcutaneous QHS   insulin glargine-yfgn  30 Units Subcutaneous Daily   metoprolol tartrate  50 mg Oral BID   multivitamin with minerals  1 tablet Oral Daily   nicotine  21 mg Transdermal Daily   omega-3 acid ethyl esters  2 g Oral BID   Ensure Max Protein  11 oz Oral BID   rosuvastatin  40 mg Oral Daily   sodium chloride flush  3 mL  Intravenous Q12H   sodium chloride flush  3 mL Intravenous Q12H   Continuous inpatient infusions:   sodium chloride     sodium chloride     sodium chloride 50 mL/hr at 02/10/23 1606   PRN inpatient medications: sodium chloride, sodium chloride, acetaminophen, ondansetron (ZOFRAN) IV, sodium chloride flush, sodium chloride flush  Vital signs in last 24 hours: Temp:  [97.9 F (36.6 C)-98.5 F (36.9 C)] 97.9 F (36.6 C) (04/24 0722) Pulse Rate:  [48-74] 54 (04/24 0835) Resp:  [18-20] 18 (04/24 0722) BP: (99-118)/(59-70) 111/62 (04/24 0835) SpO2:  [93 %-97 %] 96 % (04/24 0722) Last BM Date : 02/12/23  Intake/Output Summary (Last 24 hours) at 02/14/2023 1445 Last data filed at 02/14/2023 0830 Gross per 24 hour  Intake 840 ml  Output --  Net 840 ml    Intake/Output from previous day: 04/23 0701 - 04/24 0700 In: 860 [P.O.:360; I.V.:500] Out: -  Intake/Output this shift: Total I/O In: 480 [P.O.:480] Out: -    Physical Exam:  General: Alert male in NAD Heart:  Regular rate and rhythm.  Pulmonary: Normal respiratory effort Abdomen: Soft, nondistended, nontender. Normal bowel sounds. Extremities: No lower extremity edema  Neurologic: Alert and oriented Psych: Pleasant. Cooperative. Insight appears normal.    Principal Problem:   Chronic chest pain with high risk for coronary artery disease Active Problems:   Essential hypertension, benign   Coronary artery disease   Mixed hyperlipidemia   DM type 2 causing vascular disease   Chronic kidney disease, stage 3b   Pancreatic pseudocyst   Cigarette nicotine dependence without complication   Class 3 severe obesity due to excess calories without serious comorbidity with body mass index (BMI) of 40.0 to 44.9 in adult   OSA on CPAP   AAA (abdominal aortic aneurysm) without rupture   Chest pain   Gastroesophageal reflux disease   Abdominal pain, epigastric   History of ulcer disease   Gastric ulcer without hemorrhage or  perforation     LOS: 7 days   Willette Cluster ,NP 02/14/2023, 2:45 PM

## 2023-02-14 NOTE — Telephone Encounter (Signed)
-----   Message from Lemar Lofty., MD sent at 02/13/2023  9:39 PM EDT ----- Regarding: Follow-up KB and PG, I thought about this a bit more, I would like to go ahead and have him ready for potential necrosectomy.  But if he is doing okay then hopefully he can just be discharged and I can see him for procedure on 5/2.  If he is doing poorly then we may need to keep him in the hospital until then. Please update Clayton Peterson in my absence and I will be reviewing periodically staff messages. Thanks. GM   Clayton Peterson, Please schedule this patient for tentative necrosectomy on 5/2 at the 12 PM slot at [facility] long (separate result note has been sent to you about a patient that we are canceling will need to see Dr. Leonides Schanz for that time slot). We can postpone the CT scan if the patient agrees to the necrosectomy time.

## 2023-02-14 NOTE — Telephone Encounter (Signed)
Dr Meridee Score where would you like this pt to go since we used the 5/2 spot for the pt Bayley talked with you about?

## 2023-02-15 ENCOUNTER — Telehealth: Payer: Self-pay | Admitting: Family Medicine

## 2023-02-15 ENCOUNTER — Encounter (HOSPITAL_COMMUNITY): Payer: Self-pay | Admitting: Gastroenterology

## 2023-02-15 ENCOUNTER — Other Ambulatory Visit: Payer: Self-pay

## 2023-02-15 DIAGNOSIS — G8929 Other chronic pain: Secondary | ICD-10-CM | POA: Diagnosis not present

## 2023-02-15 DIAGNOSIS — Z9189 Other specified personal risk factors, not elsewhere classified: Secondary | ICD-10-CM | POA: Diagnosis not present

## 2023-02-15 DIAGNOSIS — R079 Chest pain, unspecified: Secondary | ICD-10-CM | POA: Diagnosis not present

## 2023-02-15 LAB — CBC
HCT: 41.8 % (ref 39.0–52.0)
Hemoglobin: 13.7 g/dL (ref 13.0–17.0)
MCH: 28.4 pg (ref 26.0–34.0)
MCHC: 32.8 g/dL (ref 30.0–36.0)
MCV: 86.5 fL (ref 80.0–100.0)
Platelets: 150 10*3/uL (ref 150–400)
RBC: 4.83 MIL/uL (ref 4.22–5.81)
RDW: 13.8 % (ref 11.5–15.5)
WBC: 5.5 10*3/uL (ref 4.0–10.5)
nRBC: 0 % (ref 0.0–0.2)

## 2023-02-15 LAB — BASIC METABOLIC PANEL
Anion gap: 10 (ref 5–15)
BUN: 23 mg/dL (ref 8–23)
CO2: 27 mmol/L (ref 22–32)
Calcium: 9.2 mg/dL (ref 8.9–10.3)
Chloride: 98 mmol/L (ref 98–111)
Creatinine, Ser: 1.39 mg/dL — ABNORMAL HIGH (ref 0.61–1.24)
GFR, Estimated: 58 mL/min — ABNORMAL LOW (ref >60)
Glucose, Bld: 127 mg/dL — ABNORMAL HIGH (ref 70–99)
Potassium: 3.8 mmol/L (ref 3.5–5.1)
Sodium: 135 mmol/L (ref 135–145)

## 2023-02-15 LAB — GLUCOSE, CAPILLARY: Glucose-Capillary: 143 mg/dL — ABNORMAL HIGH (ref 70–99)

## 2023-02-15 LAB — MAGNESIUM: Magnesium: 1.9 mg/dL (ref 1.7–2.4)

## 2023-02-15 LAB — PHOSPHORUS: Phosphorus: 3.4 mg/dL (ref 2.5–4.6)

## 2023-02-15 MED ORDER — FENOFIBRATE 160 MG PO TABS: 160.0000 mg | ORAL_TABLET | Freq: Every day | ORAL | 1 refills | Status: DC

## 2023-02-15 MED ORDER — CIPROFLOXACIN HCL 500 MG PO TABS: 500.0000 mg | ORAL_TABLET | Freq: Two times a day (BID) | ORAL | 0 refills | Status: DC

## 2023-02-15 MED ORDER — GABAPENTIN 100 MG PO CAPS: 100.0000 mg | ORAL_CAPSULE | Freq: Every day | ORAL | 0 refills | Status: DC

## 2023-02-15 NOTE — Telephone Encounter (Signed)
Refill sent.

## 2023-02-15 NOTE — Discharge Summary (Signed)
Physician Discharge Summary  Clayton Peterson ZOX:[id number] DOB: September 12, 1961 DOA: 02/06/2023  PCP: Rica Records, FNP  Admit date: 02/06/2023  Discharge date: 02/15/2023  Admitted From: Home.  Disposition:  Home  Recommendations for Outpatient Follow-up:  Follow up with PCP in 1-2 weeks. Please obtain BMP/CBC in one week. Advised to follow-up with gastroenterology Dr. Meridee Score on May 2 for pancreatic necronectomy.. Advised to take ciprofloxacin 500 mg twice daily for total of 30 days.  Home Health:None Equipment/Devices:None  Discharge Condition: Stable CODE STATUS:Full code Diet recommendation: Heart Healthy   Brief [facility] Course: This 62 y.o. male with medical history significant of CAD s/p stent, AAA s/p repair, DM, HTN, and HLD presenting with chest pain.  He reports prior h/o MI but last cath was in 2012.  The night prior to admission,  he awoke from sleep about 0200 with severe chest pain.  He has a  H/o AAA repair, prior MI with stent.  Trop neg.  ECG unchanged.  CTA neg for dissection and no PE. Cardiology consulted and he underwent cath without any intervention. Gi consulted in view of his h/o PUD, was found to have a large pancreatic pseudocyst compressing on the cardia of the stomach. He underwent EUS on 02/13/23. He also underwent cystogastrostomy and partial necrosectomy done. Subsequent double pig tail placed through the AXIOS stent. As per GI he will probably need necrosectomy in the next 1 to 2 weeks.  Repeat CT abdomen in 1 to 2 weeks. He will need to continue with ciprofloxacin BID until necronectomy vs until he gets the CT done. GI signed off , Patient felt better and wants to be discharged.   Discharge Diagnoses:  Principal Problem:   Chronic chest pain with high risk for coronary artery disease Active Problems:   Mixed hyperlipidemia   DM type 2 causing vascular disease   Essential hypertension, benign   Coronary artery disease   Chronic  kidney disease, stage 3b   Pancreatic pseudocyst   Cigarette nicotine dependence without complication   Class 3 severe obesity due to excess calories without serious comorbidity with body mass index (BMI) of 40.0 to 44.9 in adult   OSA on CPAP   AAA (abdominal aortic aneurysm) without rupture   Chest pain   Gastroesophageal reflux disease   Abdominal pain, epigastric   History of ulcer disease   Gastric ulcer without hemorrhage or perforation  Intermittent chest pain : Now Resolved.  EKG does not show ischemic changes.  Troponins are negative.  In view of his CAD (s/p DES to [facility]), ruptured AAA s/p repair 2009 , He underwent cardiac cath, did not show any blockages .  He has chronic RCA occlusion with collaterals. He has patent LAD and circumflex.  Meanwhile continue with Aspirin, Metoprolol 50 mg BID  and crestor 40 mg daily and fenofibrate 160 mg daily. Echocardiogram ordered and reviewed with the patient.  Cardio signed off.   Large pancreatic pseudocyst: Symptomatic compression of gastric lumen. Patient complaining of  chronic dull abdominal wall pain.  Last EGD in 8/22 showing gastritis and non bleeding duodenal ulcer.  Pepcid added. Liver panel wnl. Lipase and amylase wnl.  He underwent EGD  on 02/09/23 showing a single large sub epithelial mass causing extrinsic compression in the gastric fundus possibly due to large pseudocyst of the pancreas noted on CTA. Two non bleeding gastric ulcers with clear ulcer base were found in the gastric fundus along the sub epithelial mass. Biopsies taken for H Pylori testing.  He underwent Pancreas CT on 02/09/23  showing Considerable interval enlargement of chronic pancreatic pseudocyst in the distal body and tail of the pancreas, extending into the retroperitoneal area and  extension into the gastrosplenic ligament making broad contact with the undersurface of the body of the stomach.  Patient underwent EUS 02/13/23 , was found to have pancreatic  pseudocyst  with walled off necrosis, with compression on the cardia of the stomach. He also underwent cystogastrostomy and partial necrosectomy done. Subsequent double pig tail placed through the AXIOS stent. As per GI he will probably need necrosectomy in the next 1 to 2 weeks.  Repeat CT abdomen in 1 to 2 weeks. He will need to continue with ciprofloxacin BID until necronectomy vs until he gets the CT done.  Patient reports doing better, tolerating clear liquid diet,  will advance as tolerated.   Hypertension:  BP parameters are well controlled.  Continue metoprolol 50 mg every 12 hours.   Insulin dependent DM type 2:  CBG (last 3)  Recent Labs (last 2 labs)           Recent Labs    02/13/23 0721 02/13/23 1120 02/13/23 1355  GLUCAP 178* 141* 141*       Continue Semglee and SSI.  A1c is 8.7%. on 12/2022.  No changes in meds.    Mild leukocytosis  Resolved.    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 on gentle hydration.  Continue to monitor.     Mild hyponatremia:  Continue to monitor.      Estimated body mass index is 29.72 kg/m as calculated from the following:   Height as of this encounter: 5\' 9"  (1.753 m).   Weight as of this encounter: 91.3 kg.      Discharge Instructions  Discharge Instructions     Ambulatory Referral for Lung Cancer Scre   Complete by: As directed    Call MD for:  difficulty breathing, headache or visual disturbances   Complete by: As directed    Call MD for:  persistant dizziness or light-headedness   Complete by: As directed    Call MD for:  persistant nausea and vomiting   Complete by: As directed    Diet - low sodium heart healthy   Complete by: As directed    Diet Carb Modified   Complete by: As directed    Discharge instructions   Complete by: As directed    Advised to follow-up with primary care physician in 1 week. Advised to follow-up with gastroenterology Dr. Meridee Score on May 2 for  pancreatic necronectomy.. Advised to take ciprofloxacin 500 mg twice daily for total of 30 days.   Increase activity slowly   Complete by: As directed       Allergies as of 02/15/2023       Reactions   Bee Venom Anaphylaxis   Coconut (cocos Nucifera) Anaphylaxis        Medication List     STOP taking these medications    fenofibrate micronized 200 MG capsule Commonly known as: LOFIBRA   lisinopril 20 MG tablet Commonly known as: ZESTRIL       TAKE these medications    acetaminophen 325 MG tablet Commonly known as: TYLENOL Take 325 mg by mouth every 4 (four) hours as needed for pain.   aspirin EC 81 MG tablet Take 81 mg by mouth daily.   ciprofloxacin 500 MG tablet Commonly known as: CIPRO Take 1 tablet (500 mg  total) by mouth 2 (two) times daily for 20 days.   empagliflozin 10 MG Tabs tablet Commonly known as: Jardiance Take 1 tablet (10 mg total) by mouth daily before breakfast.   fenofibrate 160 MG tablet Take 1 tablet (160 mg total) by mouth daily. Start taking on: February 16, 2023   insulin glargine 100 UNIT/ML injection Commonly known as: LANTUS Inject 30 Units into the skin daily.   metFORMIN 1000 MG tablet Commonly known as: GLUCOPHAGE Take 500 mg by mouth 2 (two) times daily with a meal.   metoprolol tartrate 50 MG tablet Commonly known as: LOPRESSOR Take 1 tablet (50 mg total) by mouth 2 (two) times daily.   nitroGLYCERIN 0.4 MG SL tablet Commonly known as: NITROSTAT Place 1 tablet (0.4 mg total) under the tongue every 5 (five) minutes as needed for chest pain.   omega-3 acid ethyl esters 1 g capsule Commonly known as: LOVAZA Take 2 capsules (2 g total) by mouth 2 (two) times daily.   pantoprazole 40 MG tablet Commonly known as: PROTONIX Take 1 tablet by mouth once daily What changed: when to take this   RELION PEN NEEDLE 31G/8MM 31G X 8 MM Misc Generic drug: Insulin Pen Needle   rosuvastatin 40 MG tablet Commonly known as:  CRESTOR Take 40 mg by mouth daily.   Vitamin D3 125 MCG (5000 UT) Tabs Take 1 tablet by mouth daily.        Follow-up Information     Hilty, Lisette Abu, MD Follow up.   Specialty: Cardiology Why: Keep follow-up with Dr. Rennis Golden on Thursday Mar 15, 2023 at 2:45 PM (Arrive by 2:30 PM). Contact information: [address] [place] [address] [phone]         Rica Records, FNP Follow up in 1 week(s).   Specialty: Family Medicine Contact information: [address] [place] [address] [phone]         Mansouraty, Netty Starring., MD Follow up in 2 week(s).   Specialties: Gastroenterology, Internal Medicine Contact information: [address] [place] [address] [phone]                Allergies  Allergen Reactions   Bee Venom Anaphylaxis   Coconut (Cocos Nucifera) Anaphylaxis    Consultations: Cardiology Gastroenterology   Procedures/Studies: CT PANCREAS ABDOMEN W WO CONTRAST  Result Date: 02/10/2023 CLINICAL DATA:  62 year old male with history of pancreatitis with suspected pancreatic pseudocyst. EXAM: CT ABDOMEN WITHOUT AND WITH CONTRAST TECHNIQUE: Multidetector CT imaging of the abdomen was performed following the standard protocol before and following the bolus administration of intravenous contrast. RADIATION DOSE REDUCTION: This exam was performed according to the departmental dose-optimization program which includes automated exposure control, adjustment of the mA and/or kV according to patient size and/or use of iterative reconstruction technique. CONTRAST:  OMNIPAQUE IOHEXOL 350 MG/ML SOLN COMPARISON:  CT of the abdomen and pelvis 12/12/2022. Chest CTA 02/06/2023. FINDINGS: Lower chest: Patchy areas of mild ground-glass attenuation noted in the left lower lobe, new compared to the recent chest CT, likely of infectious or inflammatory etiology. Atherosclerotic calcifications in the distal descending  thoracic aorta as well as the right coronary artery. Hepatobiliary: Diffuse low attenuation throughout the hepatic parenchyma, indicative of a background of hepatic steatosis. Liver has a slightly shrunken appearance and nodular contour, indicative of underlying cirrhosis. No suspicious cystic or solid hepatic lesions. Gallbladder is unremarkable in appearance. Pancreas: Previously noted complex lesion in the distal body and  tail of the pancreas has substantially increased in size compared to the prior study (axial image 26 of series 10 and coronal image 53 of series 14) currently measuring 9.5 x 6.8 x 12.3 cm. This lesion extends cephalad from the pancreas into the gastrosplenic ligament making broad contact with the undersurface of the body of the stomach, and extends caudally in the adjacent regions of the retroperitoneum. This lesion demonstrates peripheral rim enhancement, but is otherwise relatively simple in appearance with internal low-attenuation. Head, uncinate process and proximal body of the pancreas are otherwise normal in appearance. Spleen: Small splenules medial to the spleen. Otherwise, unremarkable. Adrenals/Urinary Tract: Moderate atrophy of the right kidney. Mild multifocal cortical thinning in the left kidney. Nonobstructive calculi in the left renal collecting system measuring up to 6 mm. No suspicious renal lesions. No hydroureteronephrosis in the visualized portions of the abdomen. Bilateral adrenal glands are normal in appearance. Stomach/Bowel: Stomach is distorted by the large pancreatic pseudocyst (described above). No pathologic dilatation of visualized small bowel or colon. Vascular/Lymphatic: Aortic atherosclerosis with aneurysmal dilatation of the abdominal aorta, most severe in the immediate infrarenal region where it the vessel measures up to 4.7 x 4.2 cm (previously 4.6 x 3.9 cm), where there is a large amount of eccentric nonenhancing material, compatible with atheromatous plaque  and/or mural thrombus. Postoperative changes are noted from prior aorta iliac graft repair, partially imaged, with patency of the lumen. Chronic splenic vein thrombosis redemonstrated. No definite lymphadenopathy noted in the abdomen. Other: No significant volume of ascites and no pneumoperitoneum noted in the visualized portions of the peritoneal cavity. Musculoskeletal: There are no aggressive appearing lytic or blastic lesions noted in the visualized portions of the skeleton. IMPRESSION: 1. Considerable interval enlargement of chronic pancreatic pseudocyst in the distal body and tail of the pancreas, which now extends into adjacent portions of the retroperitoneum, including extension into the gastrosplenic ligament making broad contact with the undersurface of the body of the stomach, as above. 2. Chronic splenic vein thrombosis redemonstrated. 3. Postoperative changes of prior abdominal aortic aneurysm repair with persistent aneurysmal dilatation of the infrarenal abdominal aorta which measures 4.7 x 4.2 cm in diameter. 4. Hepatic steatosis. 5. Morphologic changes in the liver suggesting underlying cirrhosis. 6. Patchy areas of mild ground-glass attenuation in the left lower lobe, new compared to the prior study, likely of infectious or inflammatory etiology. 7. Additional incidental findings, as above. Electronically Signed   By: Trudie Reed M.D.   On: 02/10/2023 09:19   ECHOCARDIOGRAM COMPLETE  Result Date: 02/08/2023    ECHOCARDIOGRAM REPORT   Patient Name:   Clayton Peterson Date of Exam: 02/08/2023 Medical Rec #:  [id number]      Height:       69.0 in Accession #:    [id number]     Weight:       201.3 lb Date of Birth:  06-14-1961       BSA:          2.072 m Patient Age:    62 years       BP:           95/64 mmHg Patient Gender: M              HR:           54 bpm. Exam Location:  Inpatient Procedure: 2D Echo, Cardiac Doppler and Color Doppler Indications:    Chest Pain R07.9  History:        Patient  has no  prior history of Echocardiogram examinations.                 CAD, Signs/Symptoms:Chest Pain; Risk Factors:Diabetes,                 Hypertension, Sleep Apnea, Dyslipidemia and Current Smoker. CKD,                 stage 3.  Sonographer:    Lucendia Herrlich Referring Phys: [id number] CHRISTOPHER D MCALHANY IMPRESSIONS  1. Left ventricular ejection fraction, by estimation, is 55 to 60%. Left ventricular ejection fraction by PLAX is 58 %. The left ventricle has normal function. The left ventricle has no regional wall motion abnormalities. codominant mitral inflow, suggesting borderline diastolic dysfunciton.  2. Right ventricular systolic function is normal. The right ventricular size is normal. Tricuspid regurgitation signal is inadequate for assessing PA pressure.  3. The mitral valve is grossly normal. Trivial mitral valve regurgitation.  4. The aortic valve is tricuspid. Aortic valve regurgitation is not visualized.  5. The inferior vena cava is normal in size with greater than 50% respiratory variability, suggesting right atrial pressure of 3 mmHg. Comparison(s): No prior Echocardiogram. FINDINGS  Left Ventricle: Left ventricular ejection fraction, by estimation, is 55 to 60%. Left ventricular ejection fraction by PLAX is 58 %. The left ventricle has normal function. The left ventricle has no regional wall motion abnormalities. The left ventricular internal cavity size was normal in size. There is no left ventricular hypertrophy. Codominant mitral inflow, suggesting borderline diastolic dysfunciton. Right Ventricle: The right ventricular size is normal. No increase in right ventricular wall thickness. Right ventricular systolic function is normal. Tricuspid regurgitation signal is inadequate for assessing PA pressure. Left Atrium: Left atrial size was normal in size. Right Atrium: Right atrial size was normal in size. Pericardium: There is no evidence of pericardial effusion. Mitral Valve: The mitral valve is  grossly normal. Trivial mitral valve regurgitation. Tricuspid Valve: The tricuspid valve is grossly normal. Tricuspid valve regurgitation is trivial. Aortic Valve: The aortic valve is tricuspid. Aortic valve regurgitation is not visualized. Aortic valve peak gradient measures 6.5 mmHg. Pulmonic Valve: The pulmonic valve was normal in structure. Pulmonic valve regurgitation is not visualized. Aorta: The aortic root and ascending aorta are structurally normal, with no evidence of dilitation. Venous: The inferior vena cava is normal in size with greater than 50% respiratory variability, suggesting right atrial pressure of 3 mmHg. IAS/Shunts: No atrial level shunt detected by color flow Doppler.  LEFT VENTRICLE PLAX 2D LV EF:         Left            Diastology                ventricular     LV e' medial:    8.70 cm/s                ejection        LV E/e' medial:  8.0                fraction by     LV e' lateral:   9.48 cm/s                PLAX is 58      LV E/e' lateral: 7.4                %. LVIDd:         4.90 cm LVIDs:  3.40 cm LV PW:         1.10 cm LV IVS:        0.70 cm LVOT diam:     2.10 cm LV SV:         73 LV SV Index:   35 LVOT Area:     3.46 cm  RIGHT VENTRICLE             IVC RV S prime:     12.70 cm/s  IVC diam: 1.50 cm TAPSE (M-mode): 2.1 cm LEFT ATRIUM             Index        RIGHT ATRIUM           Index LA diam:        3.90 cm 1.88 cm/m   RA Area:     20.80 cm LA Vol (A2C):   51.7 ml 24.96 ml/m  RA Volume:   64.10 ml  30.94 ml/m LA Vol (A4C):   45.2 ml 21.82 ml/m LA Biplane Vol: 49.9 ml 24.09 ml/m  AORTIC VALVE AV Area (Vmax): 2.87 cm AV Vmax:        127.00 cm/s AV Peak Grad:   6.5 mmHg LVOT Vmax:      105.33 cm/s LVOT Vmean:     66.000 cm/s LVOT VTI:       0.212 m  AORTA Ao Root diam: 3.50 cm Ao Asc diam:  3.50 cm MITRAL VALVE MV Area (PHT): 4.80 cm    SHUNTS MV Decel Time: 158 msec    Systemic VTI:  0.21 m MV E velocity: 69.90 cm/s  Systemic Diam: 2.10 cm MV A velocity: 62.40 cm/s MV  E/A ratio:  1.12 Zoila Shutter MD Electronically signed by Zoila Shutter MD Signature Date/Time: 02/08/2023/12:55:00 PM    Final    CARDIAC CATHETERIZATION  Result Date: 02/07/2023   Prox RCA to Mid RCA lesion is 100% stenosed.   Prox LAD lesion is 40% stenosed.   Prox Cx to Dist Cx lesion is 20% stenosed.   The left ventricular systolic function is normal. Mild proximal LAD stenosis Mild plaque through the mid Circumflex. The large dominant RCA is occluded in the mid stented segment. This appears chronic. Mid and distal RCA fills from left to right collaterals. Normal LV systolic function LVEDP 6 mmHg Recommendations: Non-cardiac chest pain. Medical management of CAD.   DG Chest Port 1 View  Result Date: 02/06/2023 CLINICAL DATA:  62 year old male with history of chest pain. EXAM: PORTABLE CHEST 1 VIEW COMPARISON:  Chest x-ray 12/28/2022. FINDINGS: Lung volumes are normal. No consolidative airspace disease. No pleural effusions. No pneumothorax. No pulmonary nodule or mass noted. Pulmonary vasculature and the cardiomediastinal silhouette are within normal limits. Atherosclerosis in the thoracic aorta. IMPRESSION: 1.  No radiographic evidence of acute cardiopulmonary disease. 2. Aortic atherosclerosis. Electronically Signed   By: Trudie Reed M.D.   On: 02/06/2023 06:13   CT Angio Chest Aorta W and/or Wo Contrast  Result Date: 02/06/2023 CLINICAL DATA:  62 year old male with history of chest pain that woke him from sleep. Suspected thoracic aortic aneurysm. EXAM: CT ANGIOGRAPHY CHEST WITH CONTRAST TECHNIQUE: Multidetector CT imaging of the chest was performed using the standard protocol during bolus administration of intravenous contrast. Multiplanar CT image reconstructions and MIPs were obtained to evaluate the vascular anatomy. RADIATION DOSE REDUCTION: This exam was performed according to the departmental dose-optimization program which includes automated exposure control, adjustment of the mA  and/or kV according  to patient size and/or use of iterative reconstruction technique. CONTRAST:  75mL OMNIPAQUE IOHEXOL 350 MG/ML SOLN COMPARISON:  CT of the chest, abdomen and pelvis 05/02/2022. FINDINGS: Cardiovascular: Heart size is normal. There is no significant pericardial fluid, thickening or pericardial calcification. There is aortic atherosclerosis, as well as atherosclerosis of the great vessels of the mediastinum and the coronary arteries, including calcified atherosclerotic plaque in the left main, left anterior descending, left circumflex and right coronary arteries. Thoracic aorta is normal in size measuring 3.4 cm, 3.0 cm and 2.8 cm in diameter in the ascending thoracic aorta, mid arch and descending thoracic aorta respectively. No evidence of thoracic aortic dissection. Bovine type arch (normal anatomical variant) incidentally noted. Mediastinum/Nodes: No pathologically enlarged mediastinal or hilar lymph nodes. Esophagus is unremarkable in appearance. No axillary lymphadenopathy. Lungs/Pleura: Dependent subsegmental atelectasis in the lower lobes of the lungs bilaterally. No acute consolidative airspace disease. No pleural effusions. No definite suspicious appearing pulmonary nodules or masses are noted. Upper Abdomen: Complex cystic lesion associated with the distal body and tail of the pancreas extending cephalad exerting significant mass effect upon the undersurface of the body of the stomach (axial image 123 of series 5), currently measuring up to 11.2 x 7.9 cm, increased in size compared to the prior study, most compatible with a large pancreatic pseudocyst. Musculoskeletal: There are no aggressive appearing lytic or blastic lesions noted in the visualized portions of the skeleton. Review of the MIP images confirms the above findings. IMPRESSION: 1. No evidence of thoracic aortic aneurysm or dissection. 2. Aortic atherosclerosis, in addition to left main and three-vessel coronary artery  disease. Please note that although the presence of coronary artery calcium documents the presence of coronary artery disease, the severity of this disease and any potential stenosis cannot be assessed on this non-gated CT examination. Assessment for potential risk factor modification, dietary therapy or pharmacologic therapy may be warranted, if clinically indicated. 3. 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 Aortic Atherosclerosis (ICD10-I70.0). Electronically Signed   By: Trudie Reed M.D.   On: 02/06/2023 06:12     Subjective: Patient was seen and examined at bedside.  Overnight events noted.   Patient report doing much better and wants to be discharged.  Patient is being discharged home.  Discharge Exam: Vitals:   02/15/23 0507 02/15/23 0738  BP: 130/71 122/75  Pulse: (!) 53 64  Resp: 18 18  Temp: 98.2 F (36.8 C) 98 F (36.7 C)  SpO2: 99%    Vitals:   02/14/23 1559 02/14/23 1955 02/15/23 0507 02/15/23 0738  BP: 129/78 138/74 130/71 122/75  Pulse: (!) 50 (!) 52 (!) 53 64  Resp: 18 18 18 18   Temp: 98.2 F (36.8 C) 98.3 F (36.8 C) 98.2 F (36.8 C) 98 F (36.7 C)  TempSrc: Oral Oral Oral Oral  SpO2: 95% 95% 99%   Weight:      Height:        General: Pt is alert, awake, not in acute distress Cardiovascular: RRR, S1/S2 +, no rubs, no gallops Respiratory: CTA bilaterally, no wheezing, no rhonchi Abdominal: Soft, NT, ND, bowel sounds + Extremities: no edema, no cyanosis    The results of significant diagnostics from this hospitalization (including imaging, microbiology, ancillary and laboratory) are listed below for reference.     Microbiology: No results found for this or any previous visit (  from the past 240 hour(s)).   Labs: BNP (last 3 results) No results for input(s): "BNP" in the last 8760  hours. Basic Metabolic Panel: Recent Labs  Lab 02/10/23 0230 02/11/23 0209 02/12/23 0252 02/13/23 0329 02/15/23 0249  NA 131* 132* 134* 133* 135  K 3.9 3.9 3.8 3.8 3.8  CL 99 101 100 98 98  CO2 22 23 25 24 27   GLUCOSE 119* 192* 125* 186* 127*  BUN 22 24* 26* 26* 23  CREATININE 1.73* 1.56* 1.74* 1.67* 1.39*  CALCIUM 9.0 8.7* 8.9 9.1 9.2  MG  --   --   --   --  1.9  PHOS  --   --   --   --  3.4   Liver Function Tests: No results for input(s): "AST", "ALT", "ALKPHOS", "BILITOT", "PROT", "ALBUMIN" in the last 168 hours. No results for input(s): "LIPASE", "AMYLASE" in the last 168 hours. No results for input(s): "AMMONIA" in the last 168 hours. CBC: Recent Labs  Lab 02/11/23 0209 02/12/23 0252 02/13/23 0329 02/14/23 0156 02/15/23 0249  WBC 6.9 5.9 6.2 6.5 5.5  HGB 13.8 13.5 14.0 14.0 13.7  HCT 42.2 41.6 42.8 41.7 41.8  MCV 86.7 86.3 87.2 84.8 86.5  PLT 152 138* 162 175 150   Cardiac Enzymes: No results for input(s): "CKTOTAL", "CKMB", "CKMBINDEX", "TROPONINI" in the last 168 hours. BNP: Invalid input(s): "POCBNP" CBG: Recent Labs  Lab 02/14/23 0725 02/14/23 1143 02/14/23 1559 02/14/23 2058 02/15/23 0735  GLUCAP 194* 212* 192* 135* 143*   D-Dimer No results for input(s): "DDIMER" in the last 72 hours. Hgb A1c No results for input(s): "HGBA1C" in the last 72 hours. Lipid Profile No results for input(s): "CHOL", "HDL", "LDLCALC", "TRIG", "CHOLHDL", "LDLDIRECT" in the last 72 hours. Thyroid function studies No results for input(s): "TSH", "T4TOTAL", "T3FREE", "THYROIDAB" in the last 72 hours.  Invalid input(s): "FREET3" Anemia work up No results for input(s): "VITAMINB12", "FOLATE", "FERRITIN", "TIBC", "IRON", "RETICCTPCT" in the last 72 hours. Urinalysis No results found for: "COLORURINE", "APPEARANCEUR", "LABSPEC", "PHURINE", "GLUCOSEU", "HGBUR", "BILIRUBINUR", "KETONESUR", "PROTEINUR", "UROBILINOGEN", "NITRITE", "LEUKOCYTESUR" Sepsis Labs Recent Labs  Lab  02/12/23 0252 02/13/23 0329 02/14/23 0156 02/15/23 0249  WBC 5.9 6.2 6.5 5.5   Microbiology No results found for this or any previous visit (from the past 240 hour(s)).   Time coordinating discharge: Over 30 minutes  SIGNED:   Willeen Niece, MD  [facility] 02/15/2023, 5:24 PM Pager

## 2023-02-15 NOTE — Telephone Encounter (Signed)
Patient spouse called discharged from [place] 04.25.2024 need toc call. Appt scheduled for 05.01.2024

## 2023-02-15 NOTE — Telephone Encounter (Signed)
Need med refill gabapentin (NEURONTIN) 100 MG capsule [[id number]]  Pharmacy: [facility]

## 2023-02-16 ENCOUNTER — Telehealth: Payer: Self-pay

## 2023-02-16 NOTE — Transitions of Care (Post Inpatient/ED Visit) (Signed)
   02/16/2023  Name: Clayton Peterson MRN: [id number] DOB: 22-Nov-1960  Today's TOC FU Call Status: Today's TOC FU Call Status:: Successful TOC FU Call Competed TOC FU Call Complete Date: 02/16/23  Transition Care Management Follow-up Telephone Call Date of Discharge: 02/15/23 Type of Discharge: Inpatient Admission Primary Inpatient Discharge Diagnosis:: chest pain How have you been since you were released from the hospital?: Better Any questions or concerns?: No  Items Reviewed: Did you receive and understand the discharge instructions provided?: Yes Medications obtained and verified?: Yes (Medications Reviewed) Any new allergies since your discharge?: No Dietary orders reviewed?: Yes Do you have support at home?: No  Home Care and Equipment/Supplies: Were Home Health Services Ordered?: NA Any new equipment or medical supplies ordered?: NA  Functional Questionnaire: Do you need assistance with bathing/showering or dressing?: No Do you need assistance with meal preparation?: No Do you need assistance with eating?: No Do you have difficulty maintaining continence: No Do you need assistance with getting out of bed/getting out of a chair/moving?: No Do you have difficulty managing or taking your medications?: No  Follow up appointments reviewed: PCP Follow-up appointment confirmed?: Yes Date of PCP follow-up appointment?: 02/21/23 Follow-up Provider: Anastasia Pall [facility] Follow-up appointment confirmed?: Yes Date of Specialist follow-up appointment?: 02/21/23 Follow-Up Specialty Provider:: GI Do you need transportation to your follow-up appointment?: No Do you understand care options if your condition(s) worsen?: Yes-patient verbalized understanding    SIGNATURE Karena Addison, LPN [facility] Nurse Health Advisor Direct Dial [phone]

## 2023-02-18 ENCOUNTER — Encounter (HOSPITAL_COMMUNITY): Payer: Self-pay | Admitting: Gastroenterology

## 2023-02-19 ENCOUNTER — Telehealth: Payer: Self-pay

## 2023-02-19 NOTE — Telephone Encounter (Signed)
TOC call completed 

## 2023-02-19 NOTE — Telephone Encounter (Signed)
Transition Care Management Follow-up Telephone Call Date of discharge and from where: 02/16/23 from [facility] How have you been since you were released from the hospital? Good Any questions or concerns? No  Items Reviewed: Did the pt receive and understand the discharge instructions provided? Yes  Medications obtained and verified? Yes  Other? No  Any new allergies since your discharge? No  Dietary orders reviewed? Yes Do you have support at home? Yes   Home Care and Equipment/Supplies: Were home health services ordered? not applicable If so, what is the name of the agency? N/A  Has the agency set up a time to come to the patient's home? not applicable Were any new equipment or medical supplies ordered?  No What is the name of the medical supply agency? N/A Were you able to get the supplies/equipment? not applicable Do you have any questions related to the use of the equipment or supplies? No  Functional Questionnaire: (I = Independent and D = Dependent) ADLs: I  Bathing/Dressing- I  Meal Prep- I  Eating- I  Maintaining continence- I  Transferring/Ambulation- I  Managing Meds- I  Follow up appointments reviewed:  PCP Hospital f/u appt confirmed? Yes  Scheduled to see Iliana on 02/21/23 @ 8:00am. Specialist Hospital f/u appt confirmed? No  Scheduled to see N/A on N/A @ N/A. Are transportation arrangements needed? No  If their condition worsens, is the pt aware to call PCP or go to the Emergency Dept.? Yes Was the patient provided with contact information for the PCP's office or ED? Yes Was to pt encouraged to call back with questions or concerns? Yes

## 2023-02-21 ENCOUNTER — Encounter: Payer: Self-pay | Admitting: Family Medicine

## 2023-02-21 ENCOUNTER — Ambulatory Visit (INDEPENDENT_AMBULATORY_CARE_PROVIDER_SITE_OTHER): Payer: PPO | Admitting: Family Medicine

## 2023-02-21 ENCOUNTER — Other Ambulatory Visit: Payer: Self-pay | Admitting: Family Medicine

## 2023-02-21 VITALS — BP 124/79 | HR 54 | Resp 16 | Ht 69.0 in | Wt 200.0 lb

## 2023-02-21 DIAGNOSIS — K863 Pseudocyst of pancreas: Secondary | ICD-10-CM | POA: Diagnosis not present

## 2023-02-21 DIAGNOSIS — Z09 Encounter for follow-up examination after completed treatment for conditions other than malignant neoplasm: Secondary | ICD-10-CM | POA: Diagnosis not present

## 2023-02-21 NOTE — Assessment & Plan Note (Addendum)
Vitals:   02/21/23 0816  BP: 124/79  Blood pressure controlled in today's visit Patient denies chest pain and abdominal pain Patient is scheduled for necrosectomy on 02/22/2023 Advise patient to continue liquid diet and continue with ciprofloxacin BID 500 mg Labs ordered in today's visit BMP and CBC

## 2023-02-21 NOTE — Progress Notes (Signed)
Patient Office Visit   Subjective   Patient ID: Clayton Peterson, male    DOB: 03-21-1961  Age: 62 y.o. MRN: [id number]  CC:  Chief Complaint  Patient presents with   Hospitalization Follow-up    D/c 4/25 [facility]     HPI Clayton Peterson 62 year old male, presents to the clinic for hospital discharge follow up.Patient was seen in the ED for chest pain and pancreatic pseudocyst and was discharged on 02/15/2023 He  has a past medical history of AAA (abdominal aortic aneurysm) (HCC), Coronary artery disease, Diabetes mellitus without complication (HCC), Heart attack (HCC), High cholesterol, and Hypertension. For the details of today's visit, please refer to assessment and plan.   HPI    Outpatient Encounter Medications as of 02/21/2023  Medication Sig   acetaminophen (TYLENOL) 650 MG CR tablet Take 1,300 mg by mouth every 8 (eight) hours as needed for pain.   aspirin 81 MG EC tablet Take 81 mg by mouth daily.   Cholecalciferol (VITAMIN D3) 125 MCG (5000 UT) TABS Take 1 tablet by mouth daily.   ciprofloxacin (CIPRO) 500 MG tablet Take 1 tablet (500 mg total) by mouth 2 (two) times daily for 20 days.   empagliflozin (JARDIANCE) 10 MG TABS tablet Take 1 tablet (10 mg total) by mouth daily before breakfast.   fenofibrate 160 MG tablet Take 1 tablet (160 mg total) by mouth daily.   fenofibrate micronized (LOFIBRA) 200 MG capsule Take 200 mg by mouth daily before breakfast.   gabapentin (NEURONTIN) 100 MG capsule Take 1 capsule (100 mg total) by mouth daily.   insulin glargine (LANTUS) 100 UNIT/ML injection Inject 30 Units into the skin every evening.   metFORMIN (GLUCOPHAGE) 1000 MG tablet Take 500 mg by mouth 2 (two) times daily with a meal.   metoprolol tartrate (LOPRESSOR) 50 MG tablet Take 1 tablet (50 mg total) by mouth 2 (two) times daily.   nitroGLYCERIN (NITROSTAT) 0.4 MG SL tablet Place 1 tablet (0.4 mg total) under the tongue every 5 (five) minutes as needed for chest pain.    omega-3 acid ethyl esters (LOVAZA) 1 g capsule Take 2 capsules (2 g total) by mouth 2 (two) times daily.   Omega-3 Fatty Acids (OMEGA 3 PO) Take 2,000 mg by mouth 2 (two) times daily.   pantoprazole (PROTONIX) 40 MG tablet Take 1 tablet by mouth once daily (Patient taking differently: Take 40 mg by mouth 2 (two) times daily.)   RELION PEN NEEDLE 31G/8MM 31G X 8 MM MISC    rosuvastatin (CRESTOR) 40 MG tablet Take 40 mg by mouth at bedtime.   No facility-administered encounter medications on file as of 02/21/2023.    Past Surgical History:  Procedure Laterality Date   ABDOMINAL AORTIC ANEURYSM REPAIR     BACK SURGERY     x2   BIOPSY  06/03/2021   Procedure: BIOPSY;  Surgeon: Lanelle Bal, DO;  Location: [facility];  Service: Endoscopy;;   BIOPSY  02/09/2023   Procedure: BIOPSY;  Surgeon: Benancio Deeds, MD;  Location: [facility] ENDOSCOPY;  Service: Gastroenterology;;   CARDIAC CATHETERIZATION     stent placed on right side ofo heart   COLONOSCOPY WITH PROPOFOL N/A 06/03/2021   Surgeon: Lanelle Bal, DO; Nonbleeding internal hemorrhoids, 6 mm polyp in sigmoid colon, 2 mm polyp in the ascending colon, congested and erythematous mucosa in the cecum biopsied.  Pathology revealed 2 tubular adenomas, cecal biopsy with polypoid colonic mucosa with ectatic vascular channels  with small vascular malformation not ruled out. Repeat in 5 years.   CYST ENTEROSTOMY N/A 02/13/2023   Procedure: CYST GASTROSTOMY;  Surgeon: Meridee Score Netty Starring., MD;  Location: [facility] ENDOSCOPY;  Service: Gastroenterology;  Laterality: N/A;   ESOPHAGOGASTRODUODENOSCOPY N/A 02/11/2023   Procedure: ESOPHAGOGASTRODUODENOSCOPY (EGD);  Surgeon: Jeani Hawking, MD;  Location: [facility] ENDOSCOPY;  Service: Gastroenterology;  Laterality: N/A;   ESOPHAGOGASTRODUODENOSCOPY (EGD) WITH PROPOFOL N/A 06/03/2021   Surgeon: Lanelle Bal, DO; Gastritis biopsied, nonobstructing, nonbleeding duodenal ulcer with no stigmata of bleeding, NSAID  induced etiology.  Pathology with chronic gastritis, negative for H. pylori.   ESOPHAGOGASTRODUODENOSCOPY (EGD) WITH PROPOFOL N/A 02/09/2023   Procedure: ESOPHAGOGASTRODUODENOSCOPY (EGD) WITH PROPOFOL;  Surgeon: Benancio Deeds, MD;  Location: [facility] ENDOSCOPY;  Service: Gastroenterology;  Laterality: N/A;   ESOPHAGOGASTRODUODENOSCOPY (EGD) WITH PROPOFOL N/A 02/13/2023   Procedure: ESOPHAGOGASTRODUODENOSCOPY (EGD) WITH PROPOFOL;  Surgeon: Meridee Score Netty Starring., MD;  Location: [facility] ENDOSCOPY;  Service: Gastroenterology;  Laterality: N/A;   EUS Left 02/11/2023   Procedure: UPPER ENDOSCOPIC ULTRASOUND (EUS) LINEAR;  Surgeon: Jeani Hawking, MD;  Location: [facility] ENDOSCOPY;  Service: Gastroenterology;  Laterality: Left;   EUS N/A 02/13/2023   Procedure: UPPER ENDOSCOPIC ULTRASOUND (EUS) LINEAR;  Surgeon: Lemar Lofty., MD;  Location: [facility] ENDOSCOPY;  Service: Gastroenterology;  Laterality: N/A;   KNEE SURGERY Left    LEFT HEART CATH AND CORONARY ANGIOGRAPHY N/A 02/07/2023   Procedure: LEFT HEART CATH AND CORONARY ANGIOGRAPHY;  Surgeon: Kathleene Hazel, MD;  Location: [facility] INVASIVE CV LAB;  Service: Cardiovascular;  Laterality: N/A;   PANCREATIC STENT PLACEMENT  02/13/2023   Procedure: PANCREATIC STENT PLACEMENT;  Surgeon: Meridee Score Netty Starring., MD;  Location: [facility] ENDOSCOPY;  Service: Gastroenterology;;   PARTIAL COLECTOMY     POLYPECTOMY  06/03/2021   Procedure: POLYPECTOMY INTESTINAL;  Surgeon: Lanelle Bal, DO;  Location: [facility];  Service: Endoscopy;;    Review of Systems  Constitutional:  Negative for chills and fever.  HENT:  Negative for hearing loss.   Respiratory:  Negative for cough.   Cardiovascular:  Negative for chest pain.  Gastrointestinal:  Negative for abdominal pain, nausea and vomiting.  Genitourinary:  Negative for dysuria.  Musculoskeletal:  Negative for myalgias.  Skin:  Negative for rash.  Neurological:  Negative for dizziness and headaches.       Objective    BP 124/79   Pulse (!) 54   Resp 16   Ht 5\' 9"  (1.753 m)   Wt 200 lb (90.7 kg)   SpO2 95%   BMI 29.53 kg/m   Physical Exam Vitals reviewed.  Constitutional:      General: He is not in acute distress.    Appearance: Normal appearance. He is not ill-appearing, toxic-appearing or diaphoretic.  HENT:     Head: Normocephalic.  Eyes:     General:        Right eye: No discharge.        Left eye: No discharge.     Conjunctiva/sclera: Conjunctivae normal.  Cardiovascular:     Rate and Rhythm: Normal rate.     Pulses: Normal pulses.     Heart sounds: Normal heart sounds.  Pulmonary:     Effort: Pulmonary effort is normal. No respiratory distress.     Breath sounds: Normal breath sounds.  Abdominal:     General: Bowel sounds are normal.     Palpations: Abdomen is soft.     Tenderness: There is no right CVA tenderness, left CVA tenderness or guarding.  Musculoskeletal:  General: Normal range of motion.     Cervical back: Normal range of motion.  Skin:    General: Skin is warm and dry.     Capillary Refill: Capillary refill takes less than 2 seconds.  Neurological:     General: No focal deficit present.     Mental Status: He is alert and oriented to person, place, and time.     Coordination: Coordination normal.     Gait: Gait normal.  Psychiatric:        Mood and Affect: Mood normal.        Behavior: Behavior normal.       Assessment & Plan:  Pancreatic pseudocyst Assessment & Plan: Vitals:   02/21/23 0816  BP: 124/79  Blood pressure controlled in today's visit Patient denies chest pain and abdominal pain Patient is scheduled for necrosectomy on 02/22/2023 Advise patient to continue liquid diet and continue with ciprofloxacin BID 500 mg Labs ordered in today's visit BMP and CBC   Hospital discharge follow-up -     CBC with Differential/Platelet -     BASIC METABOLIC PANEL WITH GFR    No follow-ups on file.   Cruzita Lederer Newman Nip,  FNP

## 2023-02-21 NOTE — Patient Instructions (Signed)
        Great to see you today.   If labs were collected, we will inform you of lab results once received either by echart message or telephone call.   - echart message- for normal results that have been seen by the patient already.   - telephone call: abnormal results or if patient has not viewed results in their echart.   - Follow up with your primary health provider if any health concerns arises. - If symptoms worsen please contact your primary care provider and/or visit the emergency department.  

## 2023-02-22 ENCOUNTER — Ambulatory Visit (HOSPITAL_BASED_OUTPATIENT_CLINIC_OR_DEPARTMENT_OTHER): Payer: PPO | Admitting: Anesthesiology

## 2023-02-22 ENCOUNTER — Other Ambulatory Visit: Payer: Self-pay

## 2023-02-22 ENCOUNTER — Telehealth: Payer: Self-pay

## 2023-02-22 ENCOUNTER — Encounter (HOSPITAL_COMMUNITY): Payer: Self-pay | Admitting: Gastroenterology

## 2023-02-22 ENCOUNTER — Ambulatory Visit (HOSPITAL_COMMUNITY): Payer: PPO | Admitting: Anesthesiology

## 2023-02-22 ENCOUNTER — Ambulatory Visit (HOSPITAL_COMMUNITY)
Admission: RE | Admit: 2023-02-22 | Discharge: 2023-02-22 | Disposition: A | Discharge status: Home / Self Care | Payer: PPO | Source: Ambulatory Visit | Attending: Gastroenterology | Admitting: Gastroenterology

## 2023-02-22 ENCOUNTER — Encounter (HOSPITAL_COMMUNITY)
Admission: RE | Disposition: A | Discharge status: Home / Self Care | Payer: Self-pay | Source: Ambulatory Visit | Attending: Gastroenterology

## 2023-02-22 DIAGNOSIS — K862 Cyst of pancreas: Secondary | ICD-10-CM | POA: Diagnosis not present

## 2023-02-22 DIAGNOSIS — G4730 Sleep apnea, unspecified: Secondary | ICD-10-CM | POA: Diagnosis not present

## 2023-02-22 DIAGNOSIS — Z8719 Personal history of other diseases of the digestive system: Secondary | ICD-10-CM

## 2023-02-22 DIAGNOSIS — K8689 Other specified diseases of pancreas: Secondary | ICD-10-CM | POA: Insufficient documentation

## 2023-02-22 DIAGNOSIS — Z794 Long term (current) use of insulin: Secondary | ICD-10-CM | POA: Diagnosis not present

## 2023-02-22 DIAGNOSIS — I25119 Atherosclerotic heart disease of native coronary artery with unspecified angina pectoris: Secondary | ICD-10-CM | POA: Insufficient documentation

## 2023-02-22 DIAGNOSIS — F17210 Nicotine dependence, cigarettes, uncomplicated: Secondary | ICD-10-CM

## 2023-02-22 DIAGNOSIS — Z7984 Long term (current) use of oral hypoglycemic drugs: Secondary | ICD-10-CM | POA: Insufficient documentation

## 2023-02-22 DIAGNOSIS — I252 Old myocardial infarction: Secondary | ICD-10-CM | POA: Insufficient documentation

## 2023-02-22 DIAGNOSIS — K863 Pseudocyst of pancreas: Secondary | ICD-10-CM

## 2023-02-22 DIAGNOSIS — Z4659 Encounter for fitting and adjustment of other gastrointestinal appliance and device: Secondary | ICD-10-CM

## 2023-02-22 DIAGNOSIS — I10 Essential (primary) hypertension: Secondary | ICD-10-CM | POA: Insufficient documentation

## 2023-02-22 DIAGNOSIS — Z955 Presence of coronary angioplasty implant and graft: Secondary | ICD-10-CM | POA: Insufficient documentation

## 2023-02-22 DIAGNOSIS — E119 Type 2 diabetes mellitus without complications: Secondary | ICD-10-CM | POA: Diagnosis not present

## 2023-02-22 HISTORY — PX: PANCREATIC STENT PLACEMENT: SHX5539

## 2023-02-22 HISTORY — PX: ESOPHAGOGASTRODUODENOSCOPY (EGD) WITH PROPOFOL: SHX5813

## 2023-02-22 HISTORY — PX: STENT REMOVAL: SHX6421

## 2023-02-22 LAB — CBC WITH DIFFERENTIAL/PLATELET
Basophils Absolute: 0.1 10*3/uL (ref 0.0–0.2)
Basos: 1 %
EOS (ABSOLUTE): 0.3 10*3/uL (ref 0.0–0.4)
Eos: 4 %
Hematocrit: 44.2 % (ref 37.5–51.0)
Hemoglobin: 14.4 g/dL (ref 13.0–17.7)
Immature Grans (Abs): 0 10*3/uL (ref 0.0–0.1)
Immature Granulocytes: 1 %
Lymphocytes Absolute: 1.7 10*3/uL (ref 0.7–3.1)
Lymphs: 21 %
MCH: 28.6 pg (ref 26.6–33.0)
MCHC: 32.6 g/dL (ref 31.5–35.7)
MCV: 88 fL (ref 79–97)
Monocytes Absolute: 0.7 10*3/uL (ref 0.1–0.9)
Monocytes: 9 %
Neutrophils Absolute: 5.4 10*3/uL (ref 1.4–7.0)
Neutrophils: 64 %
Platelets: 191 10*3/uL (ref 150–450)
RBC: 5.04 x10E6/uL (ref 4.14–5.80)
RDW: 15 % (ref 11.6–15.4)
WBC: 8.2 10*3/uL (ref 3.4–10.8)

## 2023-02-22 LAB — BASIC METABOLIC PANEL
BUN/Creatinine Ratio: 10 (ref 10–24)
BUN: 15 mg/dL (ref 8–27)
CO2: 20 mmol/L (ref 20–29)
Calcium: 9.3 mg/dL (ref 8.6–10.2)
Chloride: 102 mmol/L (ref 96–106)
Creatinine, Ser: 1.43 mg/dL — ABNORMAL HIGH (ref 0.76–1.27)
Glucose: 80 mg/dL (ref 70–99)
Potassium: 4.6 mmol/L (ref 3.5–5.2)
Sodium: 138 mmol/L (ref 134–144)
eGFR: 56 mL/min/{1.73_m2} — ABNORMAL LOW (ref >59)

## 2023-02-22 LAB — GLUCOSE, CAPILLARY: Glucose-Capillary: 94 mg/dL (ref 70–99)

## 2023-02-22 SURGERY — STENT REMOVAL
Anesthesia: General

## 2023-02-22 MED ORDER — SODIUM CHLORIDE 0.9 % IV SOLN: INTRAVENOUS | Status: DC

## 2023-02-22 MED ORDER — CIPROFLOXACIN HCL 500 MG PO TABS: 500.0000 mg | ORAL_TABLET | Freq: Two times a day (BID) | ORAL | 0 refills | Status: AC

## 2023-02-22 MED ORDER — ROCURONIUM BROMIDE 10 MG/ML (PF) SYRINGE
PREFILLED_SYRINGE | INTRAVENOUS | Status: DC | PRN
  Administered 2023-02-22: 40 mg via INTRAVENOUS

## 2023-02-22 MED ORDER — MIDAZOLAM HCL 2 MG/2ML IJ SOLN
INTRAMUSCULAR | Status: AC
  Filled 2023-02-22: qty 2

## 2023-02-22 MED ORDER — PROPOFOL 10 MG/ML IV BOLUS
INTRAVENOUS | Status: AC
  Filled 2023-02-22: qty 20

## 2023-02-22 MED ORDER — FENTANYL CITRATE (PF) 100 MCG/2ML IJ SOLN
INTRAMUSCULAR | Status: AC
  Filled 2023-02-22: qty 2

## 2023-02-22 MED ORDER — MIDAZOLAM HCL 2 MG/2ML IJ SOLN
INTRAMUSCULAR | Status: DC | PRN
  Administered 2023-02-22: 2 mg via INTRAVENOUS

## 2023-02-22 MED ORDER — FENTANYL CITRATE (PF) 100 MCG/2ML IJ SOLN
INTRAMUSCULAR | Status: DC | PRN
  Administered 2023-02-22: 100 ug via INTRAVENOUS

## 2023-02-22 MED ORDER — LIDOCAINE 2% (20 MG/ML) 5 ML SYRINGE
INTRAMUSCULAR | Status: DC | PRN
  Administered 2023-02-22: 60 mg via INTRAVENOUS

## 2023-02-22 MED ORDER — CIPROFLOXACIN IN D5W 400 MG/200ML IV SOLN
INTRAVENOUS | Status: AC
  Filled 2023-02-22: qty 200

## 2023-02-22 MED ORDER — PROPOFOL 1000 MG/100ML IV EMUL
INTRAVENOUS | Status: AC
  Filled 2023-02-22: qty 100

## 2023-02-22 MED ORDER — PROPOFOL 10 MG/ML IV BOLUS
INTRAVENOUS | Status: DC | PRN
  Administered 2023-02-22: 200 mg via INTRAVENOUS

## 2023-02-22 MED ORDER — CIPROFLOXACIN IN D5W 400 MG/200ML IV SOLN
400.0000 mg | Freq: Once | INTRAVENOUS | Status: AC
  Administered 2023-02-22: 400 mg via INTRAVENOUS

## 2023-02-22 MED ORDER — SUGAMMADEX SODIUM 200 MG/2ML IV SOLN
INTRAVENOUS | Status: DC | PRN
  Administered 2023-02-22: 200 mg via INTRAVENOUS

## 2023-02-22 MED ORDER — ALBUMIN HUMAN 5 % IV SOLN
INTRAVENOUS | Status: AC
  Filled 2023-02-22: qty 250

## 2023-02-22 MED ORDER — LACTATED RINGERS IV SOLN: INTRAVENOUS | Status: DC

## 2023-02-22 MED ORDER — ONDANSETRON HCL 4 MG/2ML IJ SOLN
INTRAMUSCULAR | Status: DC | PRN
  Administered 2023-02-22: 4 mg via INTRAVENOUS

## 2023-02-22 NOTE — Discharge Instructions (Signed)
YOU HAD AN ENDOSCOPIC PROCEDURE TODAY: Refer to the procedure report and other information in the discharge instructions given to you for any specific questions about what was found during the examination. If this information does not answer your questions, please call [place] office at [phone] to clarify.   YOU SHOULD EXPECT: Some feelings of bloating in the abdomen. Passage of more gas than usual. Walking can help get rid of the air that was put into your GI tract during the procedure and reduce the bloating. If you had a lower endoscopy (such as a colonoscopy or flexible sigmoidoscopy) you may notice spotting of blood in your stool or on the toilet paper. Some abdominal soreness may be present for a day or two, also.  DIET: Your first meal following the procedure should be a light meal and then it is ok to progress to your normal diet. A half-sandwich or bowl of soup is an example of a good first meal. Heavy or fried foods are harder to digest and may make you feel nauseous or bloated. Drink plenty of fluids but you should avoid alcoholic beverages for 24 hours. If you had a esophageal dilation, please see attached instructions for diet.    ACTIVITY: Your care partner should take you home directly after the procedure. You should plan to take it easy, moving slowly for the rest of the day. You can resume normal activity the day after the procedure however YOU SHOULD NOT DRIVE, use power tools, machinery or perform tasks that involve climbing or major physical exertion for 24 hours (because of the sedation medicines used during the test).   SYMPTOMS TO REPORT IMMEDIATELY: A gastroenterologist can be reached at any hour. Please call [phone]  for any of the following symptoms:  Following lower endoscopy (colonoscopy, flexible sigmoidoscopy) Excessive amounts of blood in the stool  Significant tenderness, worsening of abdominal pains  Swelling of the abdomen that is new, acute  Fever of 100 or  higher  Following upper endoscopy (EGD, EUS, ERCP, esophageal dilation) Vomiting of blood or coffee ground material  New, significant abdominal pain  New, significant chest pain or pain under the shoulder blades  Painful or persistently difficult swallowing  New shortness of breath  Black, tarry-looking or red, bloody stools  FOLLOW UP:  If any biopsies were taken you will be contacted by phone or by letter within the next 1-3 weeks. Call [phone]  if you have not heard about the biopsies in 3 weeks.  Please also call with any specific questions about appointments or follow up tests.YOU HAD AN ENDOSCOPIC PROCEDURE TODAY: Refer to the procedure report and other information in the discharge instructions given to you for any specific questions about what was found during the examination. If this information does not answer your questions, please call [place] office at [phone] to clarify.   YOU SHOULD EXPECT: Some feelings of bloating in the abdomen. Passage of more gas than usual. Walking can help get rid of the air that was put into your GI tract during the procedure and reduce the bloating. If you had a lower endoscopy (such as a colonoscopy or flexible sigmoidoscopy) you may notice spotting of blood in your stool or on the toilet paper. Some abdominal soreness may be present for a day or two, also.  DIET: Your first meal following the procedure should be a light meal and then it is ok to progress to your normal diet. A half-sandwich or bowl of soup is an example of a   good first meal. Heavy or fried foods are harder to digest and may make you feel nauseous or bloated. Drink plenty of fluids but you should avoid alcoholic beverages for 24 hours. If you had a esophageal dilation, please see attached instructions for diet.    ACTIVITY: Your care partner should take you home directly after the procedure. You should plan to take it easy, moving slowly for the rest of the day. You can resume  normal activity the day after the procedure however YOU SHOULD NOT DRIVE, use power tools, machinery or perform tasks that involve climbing or major physical exertion for 24 hours (because of the sedation medicines used during the test).   SYMPTOMS TO REPORT IMMEDIATELY: A gastroenterologist can be reached at any hour. Please call [phone]  for any of the following symptoms:  Following lower endoscopy (colonoscopy, flexible sigmoidoscopy) Excessive amounts of blood in the stool  Significant tenderness, worsening of abdominal pains  Swelling of the abdomen that is new, acute  Fever of 100 or higher  Following upper endoscopy (EGD, EUS, ERCP, esophageal dilation) Vomiting of blood or coffee ground material  New, significant abdominal pain  New, significant chest pain or pain under the shoulder blades  Painful or persistently difficult swallowing  New shortness of breath  Black, tarry-looking or red, bloody stools  FOLLOW UP:  If any biopsies were taken you will be contacted by phone or by letter within the next 1-3 weeks. Call [phone]  if you have not heard about the biopsies in 3 weeks.  Please also call with any specific questions about appointments or follow up tests. 

## 2023-02-22 NOTE — Op Note (Signed)
[facility] Patient Name: Clayton Peterson Procedure Date: 02/22/2023 MRN: [id number] Attending MD: Corliss Parish , MD, [id number] Date of Birth: [birth year] CSN: [id number] Age: 62 Admit Type: Inpatient Procedure:                Upper GI endoscopy Indications:              Pancreatic necrosis Providers:                Corliss Parish, MD Referring MD:             Hennie Duos. Marletta Lor, DO Medicines:                General Anesthesia, Cipro 400 mg IV Complications:            No immediate complications. Estimated Blood Loss:     Estimated blood loss was minimal. Procedure:                Pre-Anesthesia Assessment:                           - Prior to the procedure, a History and Physical                            was performed, and patient medications and                            allergies were reviewed. The patient's tolerance of                            previous anesthesia was also reviewed. The risks                            and benefits of the procedure and the sedation                            options and risks were discussed with the patient.                            All questions were answered, and informed consent                            was obtained. Prior Anticoagulants: The patient has                            taken no anticoagulant or antiplatelet agents                            except for aspirin. ASA Grade Assessment: III - A                            patient with severe systemic disease. After                            reviewing the risks and benefits, the patient was  deemed in satisfactory condition to undergo the                            procedure.                           After obtaining informed consent, the endoscope was                            passed under direct vision. Throughout the                            procedure, the patient's blood pressure, pulse, and                            oxygen  saturations were monitored continuously. The                            [id number] ([id number]) Olympus therapeutic endoscope                            was introduced through the mouth, and advanced to                            the second part of duodenum. After obtaining                            informed consent, the endoscope was passed under                            direct vision. Throughout the procedure, the                            patient's blood pressure, pulse, and oxygen                            saturations were monitored continuously.The upper                            GI endoscopy was accomplished without difficulty.                            The patient tolerated the procedure. Scope In: Scope Out: Findings:      No gross lesions were noted in the entire esophagus.      A previously placed AXIOS cystgastrostomy stent with 2 double-pigtail       stents through the AXIOS were found on the posterior wall of the gastric       body. The two double pigtail stents were removed from the       cystgastrostomy with a snare. The pseudocyst was entered under direct       vision. The cyst was partially filled with fluid and necrotic tissue       that was pasty and adherent to the cyst wall. Necrosectomy was performed       with a Raptor grasping device, requiring multiple intubations  of the       cyst as well as fluid lavage and aspiration. We also used a cap to help       with suctioning some of that dead tissue out as well. At the conclusion       of the procedure, very little necrotic tissue and a large amount of       pink, viable tissue was found within the pseudocyst on direct vision.       One 7 French x 5 cm double pigtail stent was inserted across the AXIOS       cystgastrostomy stent.      Patchy mild inflammation characterized by erosions and erythema was       found in the entire examined stomach. This was previously biopsied and       negative for H. pylori.       No gross lesions were noted in the duodenal bulb, in the first portion       of the duodenum and in the second portion of the duodenum. Impression:               - No gross lesions in the entire esophagus.                           - Pre-existing AXIOS cystgastrostomy stent with 2                            double-pigtail stent was found in the posterior                            gastric wall. The double-pigtail stents were                            removed. Pancreatic necrosectomy was performed as                            outlined above. A single double-pigtail was placed                            through the AXIOS stent.                           - Gastritis.                           - No gross lesions in the duodenal bulb, in the                            first portion of the duodenum and in the second                            portion of the duodenum. Moderate Sedation:      Not Applicable - Patient had care per Anesthesia. Recommendation:           - The patient will be observed post-procedure,                            until all discharge criteria are met.                           -  Discharge patient to home.                           - Patient has a contact number available for                            emergencies. The signs and symptoms of potential                            delayed complications were discussed with the                            patient. Return to normal activities tomorrow.                            Written discharge instructions were provided to the                            patient.                           - Monitor for signs/symptoms of bleeding,                            perforation, and infection. If issues please call                            our number to get further assistance as needed.                           - Continue ciprofloxacin twice daily until CT scan.                           - Continue present medications otherwise.                            - Plan for CT abdomen pancreas protocol to be done                            in the next 2 weeks. If all is well, we will plan                            for AXIOS stent removal and cystgastrostomy stent                            removal in the coming weeks (I will tentatively                            schedule that for 3 to 4 weeks from now).                           - Observe patient's clinical course.                           -  The findings and recommendations were discussed                            with the patient.                           - The findings and recommendations were discussed                            with the patient's family. Procedure Code(s):        --- Professional ---                           [phone], Esophagogastroduodenoscopy, flexible,                            transoral; diagnostic, including collection of                            specimen(s) by brushing or washing, when performed                            (separate procedure)                           [phone], Unlisted procedure, pancreas Diagnosis Code(s):        --- Professional ---                           Z97.8, Presence of other specified devices                           K29.70, Gastritis, unspecified, without bleeding                           K86.89, Other specified diseases of pancreas CPT copyright 2022 American Medical Association. All rights reserved. The codes documented in this report are preliminary and upon coder review may  be revised to meet current compliance requirements. Corliss Parish, MD 02/22/2023 3:14:33 PM Number of Addenda: 0

## 2023-02-22 NOTE — Telephone Encounter (Signed)
-----   Message from Lemar Lofty., MD sent at 02/22/2023  3:15 PM EDT ----- Regarding: Follow-up Clayton Peterson, This patient needs a pancreas protocol CT abdomen in the next 1-1/2 to 2 weeks approximately. Patient needs EGD with stent pull approximately 3 to 4 weeks from now. Thanks. GM  FYI KC about your patient.

## 2023-02-22 NOTE — Anesthesia Preprocedure Evaluation (Addendum)
Anesthesia Evaluation  Patient identified by MRN, date of birth, ID band Patient awake    Reviewed: Allergy & Precautions, NPO status , Patient's Chart, lab work & pertinent test results  Airway Mallampati: II  TM Distance: >3 FB Neck ROM: Full    Dental  (+) Missing, Loose,    Pulmonary sleep apnea , Current Smoker and Patient abstained from smoking.   Pulmonary exam normal        Cardiovascular hypertension, + angina  + CAD, + Past MI and + Cardiac Stents  Normal cardiovascular exam     Neuro/Psych  Neuromuscular disease  negative psych ROS   GI/Hepatic Neg liver ROS, PUD,,,  Endo/Other  diabetes, Insulin Dependent, Oral Hypoglycemic Agents    Renal/GU Renal disease     Musculoskeletal negative musculoskeletal ROS (+)    Abdominal   Peds  Hematology negative hematology ROS (+)   Anesthesia Other Findings   Reproductive/Obstetrics                             Anesthesia Physical Anesthesia Plan  ASA: 4  Anesthesia Plan: General   Post-op Pain Management:    Induction: Intravenous  PONV Risk Score and Plan: 1 and Ondansetron, Dexamethasone and Treatment may vary due to age or medical condition  Airway Management Planned: Oral ETT  Additional Equipment:   Intra-op Plan:   Post-operative Plan: Extubation in OR  Informed Consent: I have reviewed the patients History and Physical, chart, labs and discussed the procedure including the risks, benefits and alternatives for the proposed anesthesia with the patient or authorized representative who has indicated his/her understanding and acceptance.     Dental advisory given  Plan Discussed with: CRNA  Anesthesia Plan Comments:        Anesthesia Quick Evaluation

## 2023-02-22 NOTE — H&P (Signed)
GASTROENTEROLOGY PROCEDURE H&P NOTE   Primary Care Physician: Rica Records, FNP  HPI: Clayton Peterson is a 62 y.o. male who presents for EGD for followup pancreatic necrosis s/p cystgastrostomy.  Past Medical History:  Diagnosis Date   AAA (abdominal aortic aneurysm) (HCC)    Coronary artery disease    Diabetes mellitus without complication (HCC)    Heart attack (HCC)    High cholesterol    Hypertension    Past Surgical History:  Procedure Laterality Date   ABDOMINAL AORTIC ANEURYSM REPAIR     BACK SURGERY     x2   BIOPSY  06/03/2021   Procedure: BIOPSY;  Surgeon: Lanelle Bal, DO;  [facility]: [facility];  Service: Endoscopy;;   BIOPSY  02/09/2023   Procedure: BIOPSY;  Surgeon: Benancio Deeds, MD;  Location: [facility] ENDOSCOPY;  Service: Gastroenterology;;   CARDIAC CATHETERIZATION     stent placed on right side ofo heart   COLONOSCOPY WITH PROPOFOL N/A 06/03/2021   Surgeon: Lanelle Bal, DO; Nonbleeding internal hemorrhoids, 6 mm polyp in sigmoid colon, 2 mm polyp in the ascending colon, congested and erythematous mucosa in the cecum biopsied.  Pathology revealed 2 tubular adenomas, cecal biopsy with polypoid colonic mucosa with ectatic vascular channels with small vascular malformation not ruled out. Repeat in 5 years.   CYST ENTEROSTOMY N/A 02/13/2023   Procedure: CYST GASTROSTOMY;  Surgeon: Meridee Score Netty Starring., MD;  Location: [facility] ENDOSCOPY;  Service: Gastroenterology;  Laterality: N/A;   ESOPHAGOGASTRODUODENOSCOPY N/A 02/11/2023   Procedure: ESOPHAGOGASTRODUODENOSCOPY (EGD);  Surgeon: Jeani Hawking, MD;  Location: [facility] ENDOSCOPY;  Service: Gastroenterology;  Laterality: N/A;   ESOPHAGOGASTRODUODENOSCOPY (EGD) WITH PROPOFOL N/A 06/03/2021   Surgeon: Lanelle Bal, DO; Gastritis biopsied, nonobstructing, nonbleeding duodenal ulcer with no stigmata of bleeding, NSAID induced etiology.  Pathology with chronic gastritis, negative for H. pylori.    ESOPHAGOGASTRODUODENOSCOPY (EGD) WITH PROPOFOL N/A 02/09/2023   Procedure: ESOPHAGOGASTRODUODENOSCOPY (EGD) WITH PROPOFOL;  Surgeon: Benancio Deeds, MD;  Location: [facility] ENDOSCOPY;  Service: Gastroenterology;  Laterality: N/A;   ESOPHAGOGASTRODUODENOSCOPY (EGD) WITH PROPOFOL N/A 02/13/2023   Procedure: ESOPHAGOGASTRODUODENOSCOPY (EGD) WITH PROPOFOL;  Surgeon: Meridee Score Netty Starring., MD;  Location: [facility] ENDOSCOPY;  Service: Gastroenterology;  Laterality: N/A;   EUS Left 02/11/2023   Procedure: UPPER ENDOSCOPIC ULTRASOUND (EUS) LINEAR;  Surgeon: Jeani Hawking, MD;  Location: [facility] ENDOSCOPY;  Service: Gastroenterology;  Laterality: Left;   EUS N/A 02/13/2023   Procedure: UPPER ENDOSCOPIC ULTRASOUND (EUS) LINEAR;  Surgeon: Lemar Lofty., MD;  Location: [facility] ENDOSCOPY;  Service: Gastroenterology;  Laterality: N/A;   KNEE SURGERY Left    LEFT HEART CATH AND CORONARY ANGIOGRAPHY N/A 02/07/2023   Procedure: LEFT HEART CATH AND CORONARY ANGIOGRAPHY;  Surgeon: Kathleene Hazel, MD;  Location: [facility] INVASIVE CV LAB;  Service: Cardiovascular;  Laterality: N/A;   PANCREATIC STENT PLACEMENT  02/13/2023   Procedure: PANCREATIC STENT PLACEMENT;  Surgeon: Meridee Score Netty Starring., MD;  Location: [facility] ENDOSCOPY;  Service: Gastroenterology;;   PARTIAL COLECTOMY     POLYPECTOMY  06/03/2021   Procedure: POLYPECTOMY INTESTINAL;  Surgeon: Lanelle Bal, DO;  Location: [facility];  Service: Endoscopy;;   No current facility-administered medications for this encounter.   No current facility-administered medications for this encounter. Allergies  Allergen Reactions   Bee Venom Anaphylaxis   Coconut (Cocos Nucifera) Anaphylaxis   Family History  Problem Relation Age of Onset   Atrial fibrillation Mother    Alzheimer's disease Mother    Diabetes Brother  Alzheimer's disease Maternal Grandmother    Aneurysm Maternal Grandfather    Pancreatitis Neg Hx    Pancreatic cancer Neg Hx    Colon cancer Neg  Hx    Social History   Socioeconomic History   Marital status: Married    Spouse name: Not on file   Number of children: Not on file   Years of education: Not on file   Highest education level: Not on file  Occupational History   Occupation: disabled, former truck driver  Tobacco Use   Smoking status: Every Day    Packs/day: 1.50    Years: 40.00    Additional pack years: 0.00    Total pack years: 60.00    Types: Cigarettes    Passive exposure: Current   Smokeless tobacco: Never  Vaping Use   Vaping Use: Never used  Substance and Sexual Activity   Alcohol use: Yes    Comment: very seldom   Drug use: Never   Sexual activity: Yes  Other Topics Concern   Not on file  Social History Narrative   Not on file   Social Determinants of Health   Financial Resource Strain: Not on file  Food Insecurity: No Food Insecurity (02/06/2023)   Hunger Vital Sign    Worried About Running Out of Food in the Last Year: Never true    Ran Out of Food in the Last Year: Never true  Transportation Needs: No Transportation Needs (02/06/2023)   PRAPARE - Administrator, Civil Service (Medical): No    Lack of Transportation (Non-Medical): No  Physical Activity: Not on file  Stress: Not on file  Social Connections: Not on file  Intimate Partner Violence: Not At Risk (02/06/2023)   Humiliation, Afraid, Rape, and Kick questionnaire    Fear of Current or Ex-Partner: No    Emotionally Abused: No    Physically Abused: No    Sexually Abused: No    Physical Exam: Today's Vitals   02/22/23 1236  BP: 128/74  Pulse: (!) 48  Resp: 11  Temp: 97.6 F (36.4 C)  TempSrc: Temporal  SpO2: 98%  Weight: 90.7 kg  Height: 5\' 9"  (1.753 m)  PainSc: 0-No pain   Body mass index is 29.53 kg/m. GEN: NAD EYE: Sclerae anicteric ENT: MMM CV: Non-tachycardic GI: Soft, NT/ND NEURO:  Alert & Oriented x 3  Lab Results: Recent Labs    02/21/23 0906  WBC 8.2  HGB 14.4  HCT 44.2  PLT 191    BMET Recent Labs    02/21/23 0906  NA 138  K 4.6  CL 102  CO2 20  GLUCOSE 80  BUN 15  CREATININE 1.43*  CALCIUM 9.3   LFT No results for input(s): "PROT", "ALBUMIN", "AST", "ALT", "ALKPHOS", "BILITOT", "BILIDIR", "IBILI" in the last 72 hours. PT/INR No results for input(s): "LABPROT", "INR" in the last 72 hours.   Impression / Plan: This is a 62 y.o.male who presents for EGD for followup pancreatic necrosis s/p cystgastrostomy.   The risks and benefits of endoscopic evaluation/treatment were discussed with the patient and/or family; these include but are not limited to the risk of perforation, infection, bleeding, missed lesions, lack of diagnosis, severe illness requiring hospitalization, as well as anesthesia and sedation related illnesses.  The patient's history has been reviewed, patient examined, no change in status, and deemed stable for procedure.  The patient and/or family is agreeable to proceed.    Corliss Parish, MD [facility]oscopy Office # [phone]

## 2023-02-22 NOTE — Transfer of Care (Signed)
Immediate Anesthesia Transfer of Care Note  Patient: Clayton Peterson [facility]  Procedure(s) Performed: STENT REMOVAL ESOPHAGOGASTRODUODENOSCOPY (EGD) WITH PROPOFOL PANCREATIC STENT PLACEMENT  Patient Location: Endoscopy Unit  Anesthesia Type:General  Level of Consciousness: awake and alert   Airway & Oxygen Therapy: Patient Spontanous Breathing and Patient connected to face mask oxygen  Post-op Assessment: Report given to RN and Post -op Vital signs reviewed and stable  Post vital signs: Reviewed and stable  Last Vitals:  Vitals Value Taken Time  BP    Temp    Pulse 53 02/22/23 1513  Resp 16 02/22/23 1513  SpO2 100 % 02/22/23 1513  Vitals shown include unvalidated device data.  Last Pain:  Vitals:   02/22/23 1236  TempSrc: Temporal  PainSc: 0-No pain         Complications: No notable events documented.

## 2023-02-22 NOTE — Anesthesia Procedure Notes (Signed)
Procedure Name: Intubation Date/Time: 02/22/2023 2:30 PM  Performed by: Florene Route, CRNAPre-anesthesia Checklist: Patient identified, Emergency Drugs available, Suction available and Patient being monitored Patient Re-evaluated:Patient Re-evaluated prior to induction Oxygen Delivery Method: Circle system utilized Preoxygenation: Pre-oxygenation with 100% oxygen Induction Type: IV induction Ventilation: Mask ventilation without difficulty and Oral airway inserted - appropriate to patient size Laryngoscope Size: Miller and 3 Grade View: Grade I Tube type: Oral Tube size: 7.5 mm Number of attempts: 1 Airway Equipment and Method: Stylet Placement Confirmation: ETT inserted through vocal cords under direct vision, positive ETCO2 and breath sounds checked- equal and bilateral Secured at: 21 cm Tube secured with: Tape Dental Injury: Teeth and Oropharynx as per pre-operative assessment

## 2023-02-23 ENCOUNTER — Other Ambulatory Visit: Payer: Self-pay

## 2023-02-23 DIAGNOSIS — Z8719 Personal history of other diseases of the digestive system: Secondary | ICD-10-CM

## 2023-02-23 DIAGNOSIS — R1013 Epigastric pain: Secondary | ICD-10-CM

## 2023-02-23 DIAGNOSIS — K863 Pseudocyst of pancreas: Secondary | ICD-10-CM

## 2023-02-23 NOTE — Telephone Encounter (Signed)
CT has been scheduled. Pt aware of the CT and EGD appt.   EGD scheduled, pt instructed and medications reviewed.  Patient instructions mailed to home.  Patient to call with any questions or concerns.

## 2023-02-23 NOTE — Anesthesia Postprocedure Evaluation (Signed)
Anesthesia Post Note  Patient: Clayton Peterson  Procedure(s) Performed: STENT REMOVAL ESOPHAGOGASTRODUODENOSCOPY (EGD) WITH PROPOFOL PANCREATIC STENT PLACEMENT Necrosectomy     Patient location during evaluation: Endoscopy Anesthesia Type: General Level of consciousness: awake Pain management: pain level controlled Vital Signs Assessment: post-procedure vital signs reviewed and stable Respiratory status: spontaneous breathing, nonlabored ventilation and respiratory function stable Cardiovascular status: blood pressure returned to baseline and stable Postop Assessment: no apparent nausea or vomiting Anesthetic complications: no   No notable events documented.  Last Vitals:  Vitals:   02/22/23 1522 02/22/23 1532  BP: 138/64 (!) 142/75  Pulse: (!) 55 (!) 52  Resp: 13 15  Temp:    SpO2: 97% 100%    Last Pain:  Vitals:   02/22/23 1532  TempSrc:   PainSc: 0-No pain                 Koltin Wehmeyer P Cyleigh Massaro

## 2023-02-23 NOTE — Telephone Encounter (Signed)
CT order has been ordered and sent to the schedulers  EGD has been set up for 04/02/23 at [facility] with GM at 2 pm  Left message on machine to call back

## 2023-02-26 ENCOUNTER — Encounter (HOSPITAL_COMMUNITY): Payer: Self-pay | Admitting: Gastroenterology

## 2023-02-28 ENCOUNTER — Encounter: Payer: PPO | Attending: Family Medicine | Admitting: Nutrition

## 2023-02-28 ENCOUNTER — Telehealth: Payer: Self-pay | Admitting: Family Medicine

## 2023-02-28 VITALS — Ht 69.0 in | Wt 201.0 lb

## 2023-02-28 DIAGNOSIS — N1832 Chronic kidney disease, stage 3b: Secondary | ICD-10-CM

## 2023-02-28 DIAGNOSIS — E119 Type 2 diabetes mellitus without complications: Secondary | ICD-10-CM | POA: Diagnosis present

## 2023-02-28 DIAGNOSIS — Z713 Dietary counseling and surveillance: Secondary | ICD-10-CM | POA: Insufficient documentation

## 2023-02-28 DIAGNOSIS — Z794 Long term (current) use of insulin: Secondary | ICD-10-CM | POA: Insufficient documentation

## 2023-02-28 DIAGNOSIS — I2510 Atherosclerotic heart disease of native coronary artery without angina pectoris: Secondary | ICD-10-CM

## 2023-02-28 DIAGNOSIS — E1159 Type 2 diabetes mellitus with other circulatory complications: Secondary | ICD-10-CM

## 2023-02-28 DIAGNOSIS — I10 Essential (primary) hypertension: Secondary | ICD-10-CM

## 2023-02-28 NOTE — Telephone Encounter (Signed)
Called in regard to continuous glucose meter.  Has been recommenced by dietician. Wants to see if pcp will  provide prescription  Wants a call back in regard

## 2023-02-28 NOTE — Patient Instructions (Addendum)
GOals  Eat three meals per meals on time. Increase whole plant based foods of fruits, vegetables and whole grains Drink only water Walk 10 minutes a day Avoid snacks and processed foods Get A1C down to get A1C 7% Talk to MD about your CGM.

## 2023-02-28 NOTE — Progress Notes (Signed)
Medical Nutrition Therapy  Appointment Start time:  0800  Appointment End time:  0900  Primary concerns today: DM Type 2  Referral diagnosis: E11.8 Preferred learning style: No preference Learning readiness: Ready   NUTRITION ASSESSMENT  62 yr old wmale referred for uncontrolled Type 2 DM. PCP De. Ruben Im, FNP at [facility]. PMH Type 2 DM, AAA, HTN, CAD, OSA, Pancreatic Psyeudocyst, GERD, Proteinuria CKD Stg 3, smoker.  A1C. 8.7%. Jardiance, Metformin 500 mg BID. Lantus 30 units. Know his diet has room for improvement and willing to make some changes. Ordered a CGM but hasn't gotten it  yet.  He is willing to work eating more whole plant based meals to improve his DM and CAD.  Anthropometrics  Wt Readings from Last 3 Encounters:  02/28/23 201 lb (91.2 kg)  02/22/23 200 lb (90.7 kg)  02/21/23 200 lb (90.7 kg)   Ht Readings from Last 3 Encounters:  02/28/23 5\' 9"  (1.753 m)  02/22/23 5\' 9"  (1.753 m)  02/21/23 5\' 9"  (1.753 m)   Body mass index is 29.68 kg/m. @BMIFA @ Facility age limit for growth %iles is 20 years. Facility age limit for growth %iles is 20 years.    Clinical Medical Hx: See chart Medications: See chart Labs: A1C 8.7% Notable Signs/Symptoms: fatigue, lack of energy, thirsty sometimes.  Lifestyle & Dietary Hx Lives with his wife.  Estimated daily fluid intake: 60 oz Supplements:  Sleep: food Stress / self-care: no issues Current average weekly physical activity: Walks some  24-Hr Dietary Recall Eats 2-3 meals per day.  Estimated Energy Needs Calories: 2000 Carbohydrate: 225g Protein: 150g Fat: 56g   NUTRITION DIAGNOSIS  NB-1.1 Food and nutrition-related knowledge deficit As related to Diabetes Type 2.  As evidenced by A1C 8.7%..   NUTRITION INTERVENTION  Nutrition education (E-1) on the following topics:  Nutrition and Diabetes education provided on My Plate, CHO counting, meal planning, portion sizes, timing of meals, avoiding  snacks between meals unless having a low blood sugar, target ranges for A1C and blood sugars, signs/symptoms and treatment of hyper/hypoglycemia, monitoring blood sugars, taking medications as prescribed, benefits of exercising 30 minutes per day and prevention of complications of DM.  Lifestyle Medicine  - Whole Food, Plant Predominant Nutrition is highly recommended: Eat Plenty of vegetables, Mushrooms, fruits, Legumes, Whole Grains, Nuts, seeds in lieu of processed meats, processed snacks/pastries red meat, poultry, eggs.    -It is better to avoid simple carbohydrates including: Cakes, Sweet Desserts, Ice Cream, Soda (diet and regular), Sweet Tea, Candies, Chips, Cookies, Store Bought Juices, Alcohol in Excess of  1-2 drinks a day, Lemonade,  Artificial Sweeteners, Doughnuts, Coffee Creamers, "Sugar-free" Products, etc, etc.  This is not a complete list.....  Exercise: If you are able: 30 -60 minutes a day ,4 days a week, or 150 minutes a week.  The longer the better.  Combine stretch, strength, and aerobic activities.  If you were told in the past that you have high risk for cardiovascular diseases, you may seek evaluation by your heart doctor prior to initiating moderate to intense exercise programs.   Handouts Provided Include  LIfestyle Medicine  Learning Style & Readiness for Change Teaching method utilized: Visual & Auditory  Demonstrated degree of understanding via: Teach Back  Barriers to learning/adherence to lifestyle change: None  Goals Established by Pt GOals  Eat three meals per meals on time. Increase whole plant based foods of fruits, vegetables and whole grains Drink only water Walk 10 minutes a day Avoid snacks  and processed foods Get A1C down to get A1C 7% Talk to MD about your CGM.   MONITORING & EVALUATION Dietary intake, weekly physical activity, and Blood sugars in 1 month.  Next Steps  Patient is to work on eating more whole plant based foods.Marland Kitchen

## 2023-03-01 ENCOUNTER — Other Ambulatory Visit: Payer: Self-pay | Admitting: Family Medicine

## 2023-03-01 MED ORDER — FREESTYLE LIBRE 3 SENSOR MISC
6 refills | Status: AC
Start: 2023-03-01 — End: ?

## 2023-03-01 NOTE — Telephone Encounter (Signed)
sent 

## 2023-03-01 NOTE — Telephone Encounter (Signed)
Left VM notifying patient.

## 2023-03-05 ENCOUNTER — Telehealth: Payer: Self-pay | Admitting: Family Medicine

## 2023-03-05 NOTE — Telephone Encounter (Signed)
Called in LVM needs prescription for [facility] reader Already has senor  Pt wants a call back

## 2023-03-05 NOTE — Telephone Encounter (Signed)
Patient spouse called need Malachy Moan reader Already has senor  Pharmacy:  [facility]

## 2023-03-06 ENCOUNTER — Other Ambulatory Visit: Payer: Self-pay

## 2023-03-06 MED ORDER — FREESTYLE LIBRE 3 READER DEVI: 1.0000 | Freq: Every day | 0 refills | Status: DC

## 2023-03-06 NOTE — Telephone Encounter (Signed)
Reader sent to [facility]

## 2023-03-13 ENCOUNTER — Other Ambulatory Visit: Payer: Self-pay | Admitting: Family Medicine

## 2023-03-14 ENCOUNTER — Ambulatory Visit (HOSPITAL_COMMUNITY)
Admission: RE | Admit: 2023-03-14 | Discharge: 2023-03-14 | Disposition: A | Discharge status: Home / Self Care | Payer: PPO | Source: Ambulatory Visit | Attending: Gastroenterology | Admitting: Gastroenterology

## 2023-03-14 DIAGNOSIS — Z8719 Personal history of other diseases of the digestive system: Secondary | ICD-10-CM | POA: Insufficient documentation

## 2023-03-14 DIAGNOSIS — K863 Pseudocyst of pancreas: Secondary | ICD-10-CM | POA: Insufficient documentation

## 2023-03-14 MED ORDER — IOHEXOL 300 MG/ML  SOLN
100.0000 mL | Freq: Once | INTRAMUSCULAR | Status: AC | PRN
  Administered 2023-03-14: 100 mL via INTRAVENOUS

## 2023-03-15 ENCOUNTER — Ambulatory Visit: Payer: PPO | Attending: Internal Medicine | Admitting: Internal Medicine

## 2023-03-15 ENCOUNTER — Encounter: Payer: Self-pay | Admitting: Internal Medicine

## 2023-03-15 VITALS — BP 148/64 | HR 60 | Ht 69.0 in | Wt 205.5 lb

## 2023-03-15 DIAGNOSIS — Z8719 Personal history of other diseases of the digestive system: Secondary | ICD-10-CM | POA: Diagnosis not present

## 2023-03-15 DIAGNOSIS — Z955 Presence of coronary angioplasty implant and graft: Secondary | ICD-10-CM | POA: Diagnosis not present

## 2023-03-15 DIAGNOSIS — E782 Mixed hyperlipidemia: Secondary | ICD-10-CM | POA: Diagnosis not present

## 2023-03-15 DIAGNOSIS — I2510 Atherosclerotic heart disease of native coronary artery without angina pectoris: Secondary | ICD-10-CM | POA: Diagnosis not present

## 2023-03-15 NOTE — Patient Instructions (Signed)
Medication Instructions:  Your physician recommends that you continue on your current medications as directed. Please refer to the Current Medication list given to you today.  *If you need a refill on your cardiac medications before your next appointment, please call your pharmacy*   Lab Work: None If you have labs (blood work) drawn today and your tests are completely normal, you will receive your results only by: MyChart Message (if you have MyChart) OR A paper copy in the mail If you have any lab test that is abnormal or we need to change your treatment, we will call you to review the results.   Testing/Procedures: None   Follow-Up: At [facility], you and your health needs are our priority.  As part of our continuing mission to provide you with exceptional heart care, we have created designated Provider Care Teams.  These Care Teams include your primary Cardiologist (physician) and Advanced Practice Providers (APPs -  Physician Assistants and Nurse Practitioners) who all work together to provide you with the care you need, when you need it.  We recommend signing up for the patient portal called "MyChart".  Sign up information is provided on this After Visit Summary.  MyChart is used to connect with patients for Virtual Visits (Telemedicine).  Patients are able to view lab/test results, encounter notes, upcoming appointments, etc.  Non-urgent messages can be sent to your provider as well.   To learn more about what you can do with MyChart, go to [email or web address].    Your next appointment:   6 month(s)  Provider:    Zoila Shutter, MD or APP   Other Instructions

## 2023-03-15 NOTE — Progress Notes (Signed)
LIPID CLINIC CONSULT NOTE  Chief Complaint:  Follow-up hospitalization  Primary Care Physician: Rica Records, FNP  Primary Cardiologist:  Chrystie Nose, MD  HPI:  Clayton Peterson is a 62 y.o. male who is being seen today for the evaluation of dyslipidemia at the request of Del York Pellant*. This is a pleasant 62 year old male who was referred for evaluation management of dyslipidemia.  He does have a history of heart disease including a prior heart attack in 2012 at which time he was found to have coronary artery disease including stenosis in the mid RCA and underwent a vision MultiLink stent (3.0 x 28 mm).  Prior to that, he underwent open repair of a AAA which apparently burst on the table as they were planning to repair it via a stent graft.  He also has type 2 diabetes, hypertension, and family history of early onset heart disease.  More recently he had an episode of pancreatitis and at that time was noted to have very high triglycerides of 709.  After additional therapy, labs 3 weeks ago showed total cholesterol 102, triglycerides 420, HDL 18 and LDL 26.  Currently is asymptomatic denying chest pain worsening shortness of breath or abdominal pain.  He reports a varied diet and work to try and reduce sugars and saturated fats.  05/29/2022  Mr. Hinote returns today for follow-up of dyslipidemia.  Lipids do show improvement after making adjustment in his meds.  LDL particle numbers now 992, LDL-C of 59, HDL-C of 27, and triglycerides 305 (down from 420 and previously 709).  He did undergo CT that I ordered to follow-up on AAA repair which was performed emergently back in 2009.  This shows aneurysm around the repair up to 4.4 cm.  No evidence of endoleak.  He was also noted to have a pancreatic pseudocyst denoting resolution of recent pancreatitis.  He is followed by GI.  03/15/2023  Mr. Clayton Peterson is seen today in follow-up.  Unfortunately was hospitalized in April with  chest pain.  He thought he was having a heart attack.  He ultimately underwent cardiac catheterization which showed an occlusion of the RCA at the area of the previously placed stent.  There were left-to-right collaterals.  Otherwise there was mild proximal LAD stenosis and some mild plaque in the circumflex.  Medical therapy was recommended.  An echo was performed which I personally reviewed and showed normal LV function.  Subsequently was found to have a severe pancreatic pseudocyst which was the cause of his pain.  He underwent stenting and management by Dr. Meridee Score.  Since then he has done fairly well.  He does have a repeat EGD scheduled in June.  His lipids have not been too bad.  An LP(a) was performed in the hospital was negative at 35.3 nmol/L.  His last lipid profile showed total cholesterol 144, triglycerides 218, HDL 35 and LDL 73.  He has been compliant with his medications.  His blood sugar has not been recently well-controlled however he is on an adjusted regimen now.  PMHx:  Past Medical History:  Diagnosis Date   AAA (abdominal aortic aneurysm) (HCC)    Coronary artery disease    Diabetes mellitus without complication (HCC)    Heart attack (HCC)    High cholesterol    Hypertension     Past Surgical History:  Procedure Laterality Date   ABDOMINAL AORTIC ANEURYSM REPAIR     BACK SURGERY     x2   BIOPSY  06/03/2021   Procedure: BIOPSY;  Surgeon: Lanelle Bal, DO;  [facility];  Service: Endoscopy;;   BIOPSY  02/09/2023   Procedure: BIOPSY;  Surgeon: Benancio Deeds, MD;  Location: [facility] ENDOSCOPY;  Service: Gastroenterology;;   CARDIAC CATHETERIZATION     stent placed on right side ofo heart   COLONOSCOPY WITH PROPOFOL N/A 06/03/2021   Surgeon: Earnest Bailey K, DO; Nonbleeding internal hemorrhoids, 6 mm polyp in sigmoid colon, 2 mm polyp in the ascending colon, congested and erythematous mucosa in the cecum biopsied.  Pathology revealed 2 tubular  adenomas, cecal biopsy with polypoid colonic mucosa with ectatic vascular channels with small vascular malformation not ruled out. Repeat in 5 years.   CYST ENTEROSTOMY N/A 02/13/2023   Procedure: CYST GASTROSTOMY;  Surgeon: Meridee Score Netty Starring., MD;  Location: [facility] ENDOSCOPY;  Service: Gastroenterology;  Laterality: N/A;   ESOPHAGOGASTRODUODENOSCOPY N/A 02/11/2023   Procedure: ESOPHAGOGASTRODUODENOSCOPY (EGD);  Surgeon: Jeani Hawking, MD;  Location: [facility] ENDOSCOPY;  Service: Gastroenterology;  Laterality: N/A;   ESOPHAGOGASTRODUODENOSCOPY (EGD) WITH PROPOFOL N/A 06/03/2021   Surgeon: Lanelle Bal, DO; Gastritis biopsied, nonobstructing, nonbleeding duodenal ulcer with no stigmata of bleeding, NSAID induced etiology.  Pathology with chronic gastritis, negative for H. pylori.   ESOPHAGOGASTRODUODENOSCOPY (EGD) WITH PROPOFOL N/A 02/09/2023   Procedure: ESOPHAGOGASTRODUODENOSCOPY (EGD) WITH PROPOFOL;  Surgeon: Benancio Deeds, MD;  Location: [facility] ENDOSCOPY;  Service: Gastroenterology;  Laterality: N/A;   ESOPHAGOGASTRODUODENOSCOPY (EGD) WITH PROPOFOL N/A 02/13/2023   Procedure: ESOPHAGOGASTRODUODENOSCOPY (EGD) WITH PROPOFOL;  Surgeon: Meridee Score Netty Starring., MD;  Location: [facility] ENDOSCOPY;  Service: Gastroenterology;  Laterality: N/A;   ESOPHAGOGASTRODUODENOSCOPY (EGD) WITH PROPOFOL N/A 02/22/2023   Procedure: ESOPHAGOGASTRODUODENOSCOPY (EGD) WITH PROPOFOL;  Surgeon: Meridee Score Netty Starring., MD;  Location: [facility] ENDOSCOPY;  Service: Gastroenterology;  Laterality: N/A;   EUS Left 02/11/2023   Procedure: UPPER ENDOSCOPIC ULTRASOUND (EUS) LINEAR;  Surgeon: Jeani Hawking, MD;  Location: [facility] ENDOSCOPY;  Service: Gastroenterology;  Laterality: Left;   EUS N/A 02/13/2023   Procedure: UPPER ENDOSCOPIC ULTRASOUND (EUS) LINEAR;  Surgeon: Lemar Lofty., MD;  Location: [facility] ENDOSCOPY;  Service: Gastroenterology;  Laterality: N/A;   KNEE SURGERY Left    LEFT HEART CATH AND CORONARY ANGIOGRAPHY N/A 02/07/2023    Procedure: LEFT HEART CATH AND CORONARY ANGIOGRAPHY;  Surgeon: Kathleene Hazel, MD;  Location: [facility] INVASIVE CV LAB;  Service: Cardiovascular;  Laterality: N/A;   PANCREATIC STENT PLACEMENT  02/13/2023   Procedure: PANCREATIC STENT PLACEMENT;  Surgeon: Meridee Score Netty Starring., MD;  Location: [facility] ENDOSCOPY;  Service: Gastroenterology;;   PANCREATIC STENT PLACEMENT  02/22/2023   Procedure: PANCREATIC STENT PLACEMENT;  Surgeon: Lemar Lofty., MD;  Location: [facility] ENDOSCOPY;  Service: Gastroenterology;;   PARTIAL COLECTOMY     POLYPECTOMY  06/03/2021   Procedure: POLYPECTOMY INTESTINAL;  Surgeon: Lanelle Bal, DO;  [facility];  Service: Endoscopy;;   STENT REMOVAL  02/22/2023   Procedure: STENT REMOVAL;  Surgeon: Lemar Lofty., MD;  Location: [facility] ENDOSCOPY;  Service: Gastroenterology;;    FAMHx:  Family History  Problem Relation Age of Onset   Atrial fibrillation Mother    Alzheimer's disease Mother    Diabetes Brother    Alzheimer's disease Maternal Grandmother    Aneurysm Maternal Grandfather    Pancreatitis Neg Hx    Pancreatic cancer Neg Hx    Colon cancer Neg Hx     SOCHx:   reports that he has been smoking cigarettes. He has a 60.00 pack-year smoking history. He has been exposed to  tobacco smoke. He has never used smokeless tobacco. He reports current alcohol use. He reports that he does not use drugs.  ALLERGIES:  Allergies  Allergen Reactions   Bee Venom Anaphylaxis   Coconut (Cocos Nucifera) Anaphylaxis    ROS: Pertinent items noted in HPI and remainder of comprehensive ROS otherwise negative.  HOME MEDS: Current Outpatient Medications on File Prior to Visit  Medication Sig Dispense Refill   acetaminophen (TYLENOL) 650 MG CR tablet Take 1,300 mg by mouth every 8 (eight) hours as needed for pain.     aspirin 81 MG EC tablet Take 81 mg by mouth daily.     Cholecalciferol (VITAMIN D3) 125 MCG (5000 UT) TABS Take 1 tablet by mouth daily.      Continuous Glucose Receiver (FREESTYLE LIBRE 3 READER) DEVI 1 each by Does not apply route daily. (Patient not taking: Reported on 03/15/2023) 1 each 0   Continuous Glucose Sensor (FREESTYLE LIBRE 3 SENSOR) MISC Place 1 sensor on the skin every 14 days. Use to check glucose continuously (Patient not taking: Reported on 03/15/2023) 2 each 6   empagliflozin (JARDIANCE) 10 MG TABS tablet Take 1 tablet (10 mg total) by mouth daily before breakfast. 30 tablet 2   fenofibrate micronized (LOFIBRA) 200 MG capsule Take 200 mg by mouth daily before breakfast.     gabapentin (NEURONTIN) 100 MG capsule Take 1 capsule by mouth once daily 30 capsule 0   insulin glargine (LANTUS) 100 UNIT/ML injection Inject 30 Units into the skin every evening.     metFORMIN (GLUCOPHAGE) 1000 MG tablet Take 500 mg by mouth 2 (two) times daily with a meal.     metoprolol tartrate (LOPRESSOR) 50 MG tablet Take 1 tablet (50 mg total) by mouth 2 (two) times daily. 60 tablet 3   nitroGLYCERIN (NITROSTAT) 0.4 MG SL tablet Place 1 tablet (0.4 mg total) under the tongue every 5 (five) minutes as needed for chest pain. 50 tablet 3   omega-3 acid ethyl esters (LOVAZA) 1 g capsule Take 2 capsules (2 g total) by mouth 2 (two) times daily. 120 capsule 3   Omega-3 Fatty Acids (OMEGA 3 PO) Take 2,000 mg by mouth 2 (two) times daily.     pantoprazole (PROTONIX) 40 MG tablet Take 40 mg by mouth 2 (two) times daily.     rosuvastatin (CRESTOR) 40 MG tablet Take 40 mg by mouth at bedtime.     fenofibrate 160 MG tablet Take 1 tablet (160 mg total) by mouth daily. (Patient not taking: Reported on 03/15/2023) 30 tablet 1   RELION PEN NEEDLE 31G/8MM 31G X 8 MM MISC  (Patient not taking: Reported on 03/15/2023)     No current facility-administered medications on file prior to visit.    LABS/IMAGING: No results found for this or any previous visit (from the past 48 hour(s)). No results found.  LIPID PANEL:    Component Value Date/Time   CHOL 144  12/29/2022 0905   TRIG 218 (H) 12/29/2022 0905   HDL 35 (L) 12/29/2022 0905   CHOLHDL 4.1 12/29/2022 0905   CHOLHDL 5.7 02/15/2022 0406   VLDL UNABLE TO CALCULATE IF TRIGLYCERIDE OVER 400 mg/dL 16/07/9603 5409   LDLCALC 73 12/29/2022 0905   LDLDIRECT 64 05/25/2022 0844   LDLDIRECT 26.0 02/15/2022 0406    WEIGHTS: Wt Readings from Last 3 Encounters:  03/15/23 205 lb 8 oz (93.2 kg)  02/28/23 201 lb (91.2 kg)  02/22/23 200 lb (90.7 kg)    VITALS: BP (!) 148/64 (  BP Location: Left Arm, Patient Position: Sitting, Cuff Size: Normal)   Pulse 60   Ht 5\' 9"  (1.753 m)   Wt 205 lb 8 oz (93.2 kg)   SpO2 94%   BMI 30.35 kg/m   EXAM: General appearance: alert and no distress Neck: no carotid bruit, no JVD, and thyroid not enlarged, symmetric, no tenderness/mass/nodules Lungs: clear to auscultation bilaterally Heart: regular rate and rhythm, S1, S2 normal, no murmur, click, rub or gallop Abdomen: soft, non-tender; bowel sounds normal; no masses,  no organomegaly and midline abdominal scar Extremities: extremities normal, atraumatic, no cyanosis or edema Pulses: 2+ and symmetric Skin: Skin color, texture, turgor normal. No rashes or lesions Neurologic: Grossly normal Psych: Pleasant  EKG: Deferred  ASSESSMENT: Coronary artery disease with prior MI, status post PCI to the mid RCA with a 3.0 x 28 mm vision MultiLink stent (2012) -repeat cardiac catheterization showed an occluded RCA with left-to-right collaterals but otherwise no significant stenosis (01/2023) History of ruptured AAA in 2009 status post open emergent repair Hypertension Dyslipidemia with high triglycerides and recent pancreatitis, negative LP(a) Type 2 diabetes Pancreatic pseudocyst -status post stenting  PLAN: 1.   Mr. Clayton Peterson was recently in the hospital for chest pain and underwent cardiac catheterization which showed an occluded mid RCA where previously placed bare-metal stent was.  He has left to right  collaterals.  Echo showed normal LV function.  Lipids are reasonably well-controlled except for elevated triglycerides but he has had poorly controlled diabetes recently which is improving.  He is under management for pancreatic pseudocyst which was the cause of his "chest pain" and that has improved.  Will continue her current medications.  Plan follow-up with me in 6 months or sooner as necessary.  Chrystie Nose, MD, [facility], FACP  [place]  [facility]  Medical Director of the [facility] &   Cardiovascular Risk Reduction Clinic Diplomate of the American Board of Clinical Lipidology Attending Cardiologist  Direct Dial: [phone]  Fax: [phone]  Website:  [email or web address] 03/15/2023, 2:53 PM

## 2023-03-26 ENCOUNTER — Encounter (HOSPITAL_COMMUNITY): Payer: Self-pay | Admitting: Gastroenterology

## 2023-04-02 ENCOUNTER — Encounter: Payer: Self-pay | Admitting: Family Medicine

## 2023-04-02 ENCOUNTER — Encounter (HOSPITAL_COMMUNITY): Payer: Self-pay | Admitting: Gastroenterology

## 2023-04-02 ENCOUNTER — Ambulatory Visit (HOSPITAL_COMMUNITY): Payer: PPO | Admitting: Certified Registered"

## 2023-04-02 ENCOUNTER — Ambulatory Visit: Payer: PPO | Admitting: Family Medicine

## 2023-04-02 ENCOUNTER — Ambulatory Visit (INDEPENDENT_AMBULATORY_CARE_PROVIDER_SITE_OTHER): Payer: PPO | Admitting: Family Medicine

## 2023-04-02 ENCOUNTER — Ambulatory Visit (HOSPITAL_BASED_OUTPATIENT_CLINIC_OR_DEPARTMENT_OTHER): Payer: PPO | Admitting: Certified Registered"

## 2023-04-02 ENCOUNTER — Other Ambulatory Visit: Payer: Self-pay

## 2023-04-02 ENCOUNTER — Ambulatory Visit (HOSPITAL_COMMUNITY)
Admission: RE | Admit: 2023-04-02 | Discharge: 2023-04-02 | Disposition: A | Discharge status: Home / Self Care | Payer: PPO | Source: Ambulatory Visit | Attending: Gastroenterology | Admitting: Gastroenterology

## 2023-04-02 ENCOUNTER — Encounter (HOSPITAL_COMMUNITY)
Admission: RE | Disposition: A | Discharge status: Home / Self Care | Payer: Self-pay | Source: Ambulatory Visit | Attending: Gastroenterology

## 2023-04-02 VITALS — BP 133/80 | HR 49 | Ht 69.0 in | Wt 203.0 lb

## 2023-04-02 DIAGNOSIS — F17210 Nicotine dependence, cigarettes, uncomplicated: Secondary | ICD-10-CM

## 2023-04-02 DIAGNOSIS — T182XXA Foreign body in stomach, initial encounter: Secondary | ICD-10-CM

## 2023-04-02 DIAGNOSIS — K863 Pseudocyst of pancreas: Secondary | ICD-10-CM

## 2023-04-02 DIAGNOSIS — K219 Gastro-esophageal reflux disease without esophagitis: Secondary | ICD-10-CM | POA: Diagnosis not present

## 2023-04-02 DIAGNOSIS — Z4659 Encounter for fitting and adjustment of other gastrointestinal appliance and device: Secondary | ICD-10-CM

## 2023-04-02 DIAGNOSIS — K449 Diaphragmatic hernia without obstruction or gangrene: Secondary | ICD-10-CM

## 2023-04-02 DIAGNOSIS — Z79899 Other long term (current) drug therapy: Secondary | ICD-10-CM | POA: Insufficient documentation

## 2023-04-02 DIAGNOSIS — G4730 Sleep apnea, unspecified: Secondary | ICD-10-CM | POA: Insufficient documentation

## 2023-04-02 DIAGNOSIS — Z794 Long term (current) use of insulin: Secondary | ICD-10-CM

## 2023-04-02 DIAGNOSIS — E1151 Type 2 diabetes mellitus with diabetic peripheral angiopathy without gangrene: Secondary | ICD-10-CM | POA: Insufficient documentation

## 2023-04-02 DIAGNOSIS — E559 Vitamin D deficiency, unspecified: Secondary | ICD-10-CM

## 2023-04-02 DIAGNOSIS — I10 Essential (primary) hypertension: Secondary | ICD-10-CM | POA: Diagnosis not present

## 2023-04-02 DIAGNOSIS — Z8719 Personal history of other diseases of the digestive system: Secondary | ICD-10-CM | POA: Diagnosis not present

## 2023-04-02 DIAGNOSIS — I2510 Atherosclerotic heart disease of native coronary artery without angina pectoris: Secondary | ICD-10-CM | POA: Insufficient documentation

## 2023-04-02 DIAGNOSIS — E782 Mixed hyperlipidemia: Secondary | ICD-10-CM

## 2023-04-02 DIAGNOSIS — I252 Old myocardial infarction: Secondary | ICD-10-CM | POA: Diagnosis not present

## 2023-04-02 DIAGNOSIS — K8689 Other specified diseases of pancreas: Secondary | ICD-10-CM | POA: Diagnosis not present

## 2023-04-02 DIAGNOSIS — E538 Deficiency of other specified B group vitamins: Secondary | ICD-10-CM

## 2023-04-02 DIAGNOSIS — K2289 Other specified disease of esophagus: Secondary | ICD-10-CM | POA: Diagnosis not present

## 2023-04-02 DIAGNOSIS — Z978 Presence of other specified devices: Secondary | ICD-10-CM

## 2023-04-02 DIAGNOSIS — R1013 Epigastric pain: Secondary | ICD-10-CM

## 2023-04-02 DIAGNOSIS — E119 Type 2 diabetes mellitus without complications: Secondary | ICD-10-CM

## 2023-04-02 HISTORY — PX: STENT REMOVAL: SHX6421

## 2023-04-02 HISTORY — PX: CYST ENTEROSTOMY: SHX6861

## 2023-04-02 HISTORY — PX: ESOPHAGOGASTRODUODENOSCOPY (EGD) WITH PROPOFOL: SHX5813

## 2023-04-02 LAB — GLUCOSE, CAPILLARY
Glucose-Capillary: 136 mg/dL — ABNORMAL HIGH (ref 70–99)
Glucose-Capillary: 110 mg/dL — ABNORMAL HIGH (ref 70–99)

## 2023-04-02 SURGERY — ESOPHAGOGASTRODUODENOSCOPY (EGD) WITH PROPOFOL
Anesthesia: Monitor Anesthesia Care

## 2023-04-02 MED ORDER — ONDANSETRON HCL 4 MG/2ML IJ SOLN
INTRAMUSCULAR | Status: DC | PRN
  Administered 2023-04-02: 4 mg via INTRAVENOUS

## 2023-04-02 MED ORDER — PANTOPRAZOLE SODIUM 40 MG PO TBEC: 40.0000 mg | DELAYED_RELEASE_TABLET | Freq: Two times a day (BID) | ORAL | 3 refills | Status: DC

## 2023-04-02 MED ORDER — SODIUM CHLORIDE 0.9 % IV SOLN: INTRAVENOUS | Status: DC

## 2023-04-02 MED ORDER — PROPOFOL 10 MG/ML IV BOLUS
INTRAVENOUS | Status: DC | PRN
  Administered 2023-04-02: 30 mg via INTRAVENOUS

## 2023-04-02 MED ORDER — PROPOFOL 1000 MG/100ML IV EMUL
INTRAVENOUS | Status: AC
  Filled 2023-04-02: qty 100

## 2023-04-02 MED ORDER — PROPOFOL 500 MG/50ML IV EMUL
INTRAVENOUS | Status: DC | PRN
  Administered 2023-04-02: 135 ug/kg/min via INTRAVENOUS

## 2023-04-02 MED ORDER — PROPOFOL 10 MG/ML IV BOLUS
INTRAVENOUS | Status: AC
  Filled 2023-04-02: qty 20

## 2023-04-02 MED ORDER — LACTATED RINGERS IV SOLN: INTRAVENOUS | Status: DC

## 2023-04-02 SURGICAL SUPPLY — 15 items

## 2023-04-02 NOTE — Assessment & Plan Note (Addendum)
Vitals:   04/02/23 0858 04/02/23 0932 04/02/23 0945  BP: (!) 140/67 130/79 133/80  Blood pressure controlled in today's visit Continue Metoprolol tartrate 50 mg twice daily.    Explained non pharmacological interventions such as low salt, DASH diet discussed. Educated on stress reduction and physical activity minimum 150 minutes per week. Discussed signs and symptoms of major cardiovascular event and need to present to the ED. Follow up in 3 months or sooner if needed. Patient verbalizes understanding regarding plan of care and all questions answered.

## 2023-04-02 NOTE — Patient Instructions (Addendum)
      Please Schedule Medicare Annual Wellness   Great to see you today.  I have refilled the medication(s) we provide.    If labs were collected, we will inform you of lab results once received either by echart message or telephone call.   - echart message- for normal results that have been seen by the patient already.   - telephone call: abnormal results or if patient has not viewed results in their echart.   - Please take medications as prescribed. - Follow up with your primary health provider if any health concerns arises. - If symptoms worsen please contact your primary care provider and/or visit the emergency department.

## 2023-04-02 NOTE — Anesthesia Preprocedure Evaluation (Addendum)
Anesthesia Evaluation  Patient identified by MRN, date of birth, ID band Patient awake    Reviewed: Allergy & Precautions, NPO status , Patient's Chart, lab work & pertinent test results  Airway Mallampati: II  TM Distance: >3 FB Neck ROM: Full    Dental no notable dental hx. (+) Missing, Dental Advisory Given, Poor Dentition,    Pulmonary sleep apnea (cannot tolerate it) , Current SmokerPatient did not abstain from smoking.   Pulmonary exam normal breath sounds clear to auscultation       Cardiovascular hypertension, Pt. on medications + CAD, + Past MI and + Peripheral Vascular Disease  Normal cardiovascular exam Rhythm:Regular Rate:Normal  02/07/2013 TTE 1. Left ventricular ejection fraction, by estimation, is 55 to 60%. Left  ventricular ejection fraction by PLAX is 58 %. The left ventricle has  normal function. The left ventricle has no regional wall motion  abnormalities. codominant mitral inflow,  suggesting borderline diastolic dysfunciton.   2. Right ventricular systolic function is normal. The right ventricular  size is normal. Tricuspid regurgitation signal is inadequate for assessing  PA pressure.   3. The mitral valve is grossly normal. Trivial mitral valve  regurgitation.   4. The aortic valve is tricuspid. Aortic valve regurgitation is not  visualized.   5. The inferior vena cava is normal in size with greater than 50%  respiratory variability, suggesting right atrial pressure of 3 mmHg.      Neuro/Psych  negative psych ROS   GI/Hepatic ,GERD  ,,  Endo/Other  diabetes, Well Controlled    Renal/GU      Musculoskeletal   Abdominal   Peds  Hematology   Anesthesia Other Findings NKDA  Reproductive/Obstetrics                             Anesthesia Physical Anesthesia Plan  ASA: 3  Anesthesia Plan: MAC   Post-op Pain Management: Minimal or no pain anticipated    Induction: Intravenous  PONV Risk Score and Plan:   Airway Management Planned: Nasal Cannula and Simple Face Mask  Additional Equipment: None  Intra-op Plan:   Post-operative Plan:   Informed Consent: I have reviewed the patients History and Physical, chart, labs and discussed the procedure including the risks, benefits and alternatives for the proposed anesthesia with the patient or authorized representative who has indicated his/her understanding and acceptance.     Dental advisory given  Plan Discussed with:   Anesthesia Plan Comments: (Removal on pancreatic duct stent , Pseudocyst  for EGD Under MAC)        Anesthesia Quick Evaluation

## 2023-04-02 NOTE — Anesthesia Procedure Notes (Signed)
Procedure Name: MAC Date/Time: 04/02/2023 2:23 PM  Performed by: Nelle Don, CRNAPre-anesthesia Checklist: Emergency Drugs available, Suction available, Patient being monitored and Patient identified Oxygen Delivery Method: Simple face mask

## 2023-04-02 NOTE — Discharge Instructions (Signed)
YOU HAD AN ENDOSCOPIC PROCEDURE TODAY: Refer to the procedure report and other information in the discharge instructions given to you for any specific questions about what was found during the examination. If this information does not answer your questions, please call [place] office at [phone] to clarify.  ° °YOU SHOULD EXPECT: Some feelings of bloating in the abdomen. Passage of more gas than usual. Walking can help get rid of the air that was put into your GI tract during the procedure and reduce the bloating.  ° °DIET: Your first meal following the procedure should be a light meal and then it is ok to progress to your normal diet. A half-sandwich or bowl of soup is an example of a good first meal. Heavy or fried foods are harder to digest and may make you feel nauseous or bloated. Drink plenty of fluids but you should avoid alcoholic beverages for 24 hours. ° °ACTIVITY: Your care partner should take you home directly after the procedure. You should plan to take it easy, moving slowly for the rest of the day. You can resume normal activity the day after the procedure however YOU SHOULD NOT DRIVE, use power tools, machinery or perform tasks that involve climbing or major physical exertion for 24 hours (because of the sedation medicines used during the test).  ° °SYMPTOMS TO REPORT IMMEDIATELY: °A gastroenterologist can be reached at any hour. Please call [phone]  for any of the following symptoms:  ° °Following upper endoscopy (EGD, EUS, ERCP, esophageal dilation) °Vomiting of blood or coffee ground material  °New, significant abdominal pain  °New, significant chest pain or pain under the shoulder blades  °Painful or persistently difficult swallowing  °New shortness of breath  °Black, tarry-looking or red, bloody stools ° °FOLLOW UP:  °If any biopsies were taken you will be contacted by phone or by letter within the next 1-3 weeks. Call [phone]  if you have not heard about the biopsies in 3 weeks.    °Please also call with any specific questions about appointments or follow up tests. ° °

## 2023-04-02 NOTE — Op Note (Signed)
[facility] Patient Name: Clayton Peterson Procedure Date: 04/02/2023 MRN: [id number] Attending MD: Corliss Parish , MD, [id number] Date of Birth: [birth year] CSN: [id number] Age: 62 Admit Type: Outpatient Procedure:                Upper GI endoscopy Indications:              Foreign body in the stomach, Stent removal, Stent                            follow-up Providers:                Corliss Parish, MD, Margaree Mackintosh, RN,                            Martha Clan, RN, Kandice Robinsons, Technician Referring MD:             Hennie Duos. Marletta Lor, DO Medicines:                Monitored Anesthesia Care Complications:            No immediate complications. Estimated Blood Loss:     Estimated blood loss was minimal. Procedure:                Pre-Anesthesia Assessment:                           - Prior to the procedure, a History and Physical                            was performed, and patient medications and                            allergies were reviewed. The patient's tolerance of                            previous anesthesia was also reviewed. The risks                            and benefits of the procedure and the sedation                            options and risks were discussed with the patient.                            All questions were answered, and informed consent                            was obtained. Prior Anticoagulants: The patient has                            taken no anticoagulant or antiplatelet agents                            except for aspirin. ASA Grade Assessment: III - A  patient with severe systemic disease. After                            reviewing the risks and benefits, the patient was                            deemed in satisfactory condition to undergo the                            procedure.                           After obtaining informed consent, the endoscope was                             passed under direct vision. Throughout the                            procedure, the patient's blood pressure, pulse, and                            oxygen saturations were monitored continuously. The                            [id number] ([id number] Olympus therapeutic endoscope                            was introduced through the mouth, and advanced to                            the second part of duodenum. The upper GI endoscopy                            was accomplished without difficulty. The patient                            tolerated the procedure. Scope In: Scope Out: Findings:      No gross lesions were noted in the entire esophagus.      The Z-line was irregular and was found 40 cm from the incisors.      A 1 cm hiatal hernia was present.      A previously placed AXIOS cystgastrostomy stent and double-pigtail stent       was found on the posterior wall of the stomach. The two stents were       removed from the cystgastrostomy with a combination of Raptor grasping       device and snare. The cystgastrostomy tract was entered into the       previous pseudocyst under direct vision. The cyst cavity was partially       filled with black necrotic tissue that was pasty and adherent to the       cyst wall. Necrosectomy was performed with a rat-toothed forceps,       requiring several intubations of the cyst. At the conclusion of the       procedure, very little necrotic tissue and a large amount of pink,  viable tissue was found within the pseudocyst on direct vision.      No other gross lesions were noted in the entire examined stomach.      No gross lesions were noted in the duodenal bulb, in the first portion       of the duodenum and in the second portion of the duodenum. Impression:               - No gross lesions in the entire esophagus. Z-line                            irregular, 40 cm from the incisors.                           - 1 cm hiatal hernia.                            - AXIOS cystgastrostomy stent and double-pigtail                            stents were removed. Mild necrosectomy performed.                            Very viable tissue was noted.                           - No other gross lesions in the entire stomach.                           - No gross lesions in the duodenal bulb, in the                            first portion of the duodenum and in the second                            portion of the duodenum. Moderate Sedation:      Not Applicable - Patient had care per Anesthesia. Recommendation:           - The patient will be observed post-procedure,                            until all discharge criteria are met.                           - Discharge patient to home.                           - Patient has a contact number available for                            emergencies. The signs and symptoms of potential                            delayed complications were discussed with the  patient. Return to normal activities tomorrow.                            Written discharge instructions were provided to the                            patient.                           - Low fat diet for 1 week.                           - Continue present medications.                           - Await pathology results.                           - Follow-up with primary gastroenterologist in                            [place], Dr. Marletta Lor, for further management.                           - If patient were to have recurrence of significant                            pancreatic cyst, on follow-up imaging in the course                            of the coming months or year, will need to then                            consider repeat cystgastrostomy as well as                            potential pancreatic duct ERCP to evaluate for leak.                           - Recommend pancreas protocol CT abdomen in 2 to 3                             months (will relay this to patient's primary                            gastroenterologist and see if they would be willing                            to order this).                           - The findings and recommendations were discussed                            with the patient.                           -  The findings and recommendations were discussed                            with the patient's family. Procedure Code(s):        --- Professional ---                           [phone], Esophagogastroduodenoscopy, flexible,                            transoral; diagnostic, including collection of                            specimen(s) by brushing or washing, when performed                            (separate procedure)                           [phone], Unlisted procedure, pancreas Diagnosis Code(s):        --- Professional ---                           K22.89, Other specified disease of esophagus                           K44.9, Diaphragmatic hernia without obstruction or                            gangrene                           Z97.8, Presence of other specified devices                           T18.2XXA, Foreign body in stomach, initial encounter                           Z46.59, Encounter for fitting and adjustment of                            other gastrointestinal appliance and device                           Z09, Encounter for follow-up examination after                            completed treatment for conditions other than                            malignant neoplasm CPT copyright 2022 [employer]. All rights reserved. The codes documented in this report are preliminary and upon coder review may  be revised to meet current compliance requirements. Corliss Parish, MD 04/02/2023 2:57:23 PM Number of Addenda: 0

## 2023-04-02 NOTE — Progress Notes (Signed)
Patient Office Visit   Subjective   Patient ID: Clayton Peterson, male    DOB: [birth year]  Age: 62 y.o. MRN: [id number]  CC:  Chief Complaint  Patient presents with   Follow-up    Patient is here for chronic f/u. No changes or concerns since last visit.     HPI Clayton Peterson 62 year old male, presents to the clinic for HTN follow up. He  has a past medical history of AAA (abdominal aortic aneurysm) (HCC), Coronary artery disease, Diabetes mellitus without complication (HCC), Heart attack (HCC), High cholesterol, and Hypertension.For the details of today's visit, please refer to assessment and plan.   HPI    Outpatient Encounter Medications as of 04/02/2023  Medication Sig   acetaminophen (TYLENOL) 650 MG CR tablet Take 1,300 mg by mouth every 8 (eight) hours as needed for pain.   aspirin 81 MG EC tablet Take 81 mg by mouth daily.   Cholecalciferol (VITAMIN D3) 125 MCG (5000 UT) TABS Take 1 tablet by mouth daily.   Continuous Glucose Receiver (FREESTYLE LIBRE 3 READER) DEVI 1 each by Does not apply route daily.   Continuous Glucose Sensor (FREESTYLE LIBRE 3 SENSOR) MISC Place 1 sensor on the skin every 14 days. Use to check glucose continuously   empagliflozin (JARDIANCE) 10 MG TABS tablet Take 1 tablet (10 mg total) by mouth daily before breakfast.   fenofibrate micronized (LOFIBRA) 200 MG capsule Take 200 mg by mouth daily before breakfast.   gabapentin (NEURONTIN) 100 MG capsule Take 1 capsule by mouth once daily   insulin glargine (LANTUS) 100 UNIT/ML injection Inject 30 Units into the skin at bedtime.   metFORMIN (GLUCOPHAGE) 1000 MG tablet Take 500 mg by mouth 2 (two) times daily with a meal.   metoprolol tartrate (LOPRESSOR) 50 MG tablet Take 1 tablet (50 mg total) by mouth 2 (two) times daily.   nitroGLYCERIN (NITROSTAT) 0.4 MG SL tablet Place 1 tablet (0.4 mg total) under the tongue every 5 (five) minutes as needed for chest pain.   Omega-3 Fatty Acids (OMEGA 3 PO) Take  2,000 mg by mouth 2 (two) times daily.   ONETOUCH ULTRA test strip USE 1 STRIP TO CHECK GLUCOSE THREE TIMES DAILY   RELION PEN NEEDLE 31G/8MM 31G X 8 MM MISC    rosuvastatin (CRESTOR) 40 MG tablet Take 40 mg by mouth at bedtime.   [DISCONTINUED] fenofibrate 160 MG tablet Take 1 tablet (160 mg total) by mouth daily.   [DISCONTINUED] pantoprazole (PROTONIX) 40 MG tablet Take 40 mg by mouth 2 (two) times daily.   pantoprazole (PROTONIX) 40 MG tablet Take 1 tablet (40 mg total) by mouth 2 (two) times daily.   No facility-administered encounter medications on file as of 04/02/2023.    Past Surgical History:  Procedure Laterality Date   ABDOMINAL AORTIC ANEURYSM REPAIR     BACK SURGERY     x2   BIOPSY  06/03/2021   Procedure: BIOPSY;  Surgeon: Lanelle Bal, DO;  Location: [facility];  Service: Endoscopy;;   BIOPSY  02/09/2023   Procedure: BIOPSY;  Surgeon: Benancio Deeds, MD;  Location: [facility] ENDOSCOPY;  Service: Gastroenterology;;   CARDIAC CATHETERIZATION     stent placed on right side ofo heart   COLONOSCOPY WITH PROPOFOL N/A 06/03/2021   Surgeon: Lanelle Bal, DO; Nonbleeding internal hemorrhoids, 6 mm polyp in sigmoid colon, 2 mm polyp in the ascending colon, congested and erythematous mucosa in the cecum biopsied.  Pathology revealed  2 tubular adenomas, cecal biopsy with polypoid colonic mucosa with ectatic vascular channels with small vascular malformation not ruled out. Repeat in 5 years.   CYST ENTEROSTOMY N/A 02/13/2023   Procedure: CYST GASTROSTOMY;  Surgeon: Meridee Score Netty Starring., MD;  Location: [facility] ENDOSCOPY;  Service: Gastroenterology;  Laterality: N/A;   ESOPHAGOGASTRODUODENOSCOPY N/A 02/11/2023   Procedure: ESOPHAGOGASTRODUODENOSCOPY (EGD);  Surgeon: Jeani Hawking, MD;  Location: [facility] ENDOSCOPY;  Service: Gastroenterology;  Laterality: N/A;   ESOPHAGOGASTRODUODENOSCOPY (EGD) WITH PROPOFOL N/A 06/03/2021   Surgeon: Lanelle Bal, DO; Gastritis biopsied,  nonobstructing, nonbleeding duodenal ulcer with no stigmata of bleeding, NSAID induced etiology.  Pathology with chronic gastritis, negative for H. pylori.   ESOPHAGOGASTRODUODENOSCOPY (EGD) WITH PROPOFOL N/A 02/09/2023   Procedure: ESOPHAGOGASTRODUODENOSCOPY (EGD) WITH PROPOFOL;  Surgeon: Benancio Deeds, MD;  Location: [facility] ENDOSCOPY;  Service: Gastroenterology;  Laterality: N/A;   ESOPHAGOGASTRODUODENOSCOPY (EGD) WITH PROPOFOL N/A 02/13/2023   Procedure: ESOPHAGOGASTRODUODENOSCOPY (EGD) WITH PROPOFOL;  Surgeon: Meridee Score Netty Starring., MD;  Location: [facility] ENDOSCOPY;  Service: Gastroenterology;  Laterality: N/A;   ESOPHAGOGASTRODUODENOSCOPY (EGD) WITH PROPOFOL N/A 02/22/2023   Procedure: ESOPHAGOGASTRODUODENOSCOPY (EGD) WITH PROPOFOL;  Surgeon: Meridee Score Netty Starring., MD;  Location: [facility] ENDOSCOPY;  Service: Gastroenterology;  Laterality: N/A;   EUS Left 02/11/2023   Procedure: UPPER ENDOSCOPIC ULTRASOUND (EUS) LINEAR;  Surgeon: Jeani Hawking, MD;  Location: [facility] ENDOSCOPY;  Service: Gastroenterology;  Laterality: Left;   EUS N/A 02/13/2023   Procedure: UPPER ENDOSCOPIC ULTRASOUND (EUS) LINEAR;  Surgeon: Lemar Lofty., MD;  Location: [facility] ENDOSCOPY;  Service: Gastroenterology;  Laterality: N/A;   KNEE SURGERY Left    LEFT HEART CATH AND CORONARY ANGIOGRAPHY N/A 02/07/2023   Procedure: LEFT HEART CATH AND CORONARY ANGIOGRAPHY;  Surgeon: Kathleene Hazel, MD;  Location: [facility] INVASIVE CV LAB;  Service: Cardiovascular;  Laterality: N/A;   PANCREATIC STENT PLACEMENT  02/13/2023   Procedure: PANCREATIC STENT PLACEMENT;  Surgeon: Meridee Score Netty Starring., MD;  Location: [facility] ENDOSCOPY;  Service: Gastroenterology;;   PANCREATIC STENT PLACEMENT  02/22/2023   Procedure: PANCREATIC STENT PLACEMENT;  Surgeon: Lemar Lofty., MD;  Location: [facility] ENDOSCOPY;  Service: Gastroenterology;;   PARTIAL COLECTOMY     POLYPECTOMY  06/03/2021   Procedure: POLYPECTOMY INTESTINAL;  Surgeon: Lanelle Bal, DO;   Location: [facility];  Service: Endoscopy;;   STENT REMOVAL  02/22/2023   Procedure: STENT REMOVAL;  Surgeon: Lemar Lofty., MD;  Location: [facility] ENDOSCOPY;  Service: Gastroenterology;;    Review of Systems  Constitutional:  Negative for chills and fever.  Eyes:  Negative for blurred vision.  Respiratory:  Negative for shortness of breath.   Cardiovascular:  Negative for chest pain.  Genitourinary:  Negative for dysuria.  Musculoskeletal:  Negative for myalgias.  Neurological:  Negative for dizziness and headaches.      Objective    BP 133/80   Pulse (!) 49   Ht 5\' 9"  (1.753 m)   Wt 203 lb (92.1 kg)   SpO2 98%   BMI 29.98 kg/m   Physical Exam Vitals reviewed.  Constitutional:      General: He is not in acute distress.    Appearance: Normal appearance. He is not ill-appearing, toxic-appearing or diaphoretic.  HENT:     Head: Normocephalic.  Eyes:     General:        Right eye: No discharge.        Left eye: No discharge.     Conjunctiva/sclera: Conjunctivae normal.  Cardiovascular:     Rate and Rhythm: Normal rate.  Pulses: Normal pulses.     Heart sounds: Normal heart sounds.  Pulmonary:     Effort: Pulmonary effort is normal. No respiratory distress.     Breath sounds: Normal breath sounds.  Musculoskeletal:        General: Normal range of motion.     Cervical back: Normal range of motion.  Skin:    General: Skin is warm and dry.     Capillary Refill: Capillary refill takes less than 2 seconds.  Neurological:     General: No focal deficit present.     Mental Status: He is alert and oriented to person, place, and time.     Coordination: Coordination normal.     Gait: Gait normal.  Psychiatric:        Mood and Affect: Mood normal.        Behavior: Behavior normal.       Assessment & Plan:  Diabetes mellitus with hemoglobin A1c goal of 7.0%-8.0% (HCC) -     Hemoglobin A1c  Primary hypertension -     Microalbumin / creatinine urine ratio -      CMP14+EGFR -     CBC with Differential/Platelet  Mixed hyperlipidemia -     Lipid panel  Vitamin D deficiency -     VITAMIN D 25 Hydroxy (Vit-D Deficiency, Fractures)  Vitamin B12 deficiency -     Vitamin B12  Essential hypertension, benign Assessment & Plan: Vitals:   04/02/23 0858 04/02/23 0932 04/02/23 0945  BP: (!) 140/67 130/79 133/80  Blood pressure controlled in today's visit Continue Metoprolol tartrate 50 mg twice daily.    Explained non pharmacological interventions such as low salt, DASH diet discussed. Educated on stress reduction and physical activity minimum 150 minutes per week. Discussed signs and symptoms of major cardiovascular event and need to present to the ED. Follow up in 3 months or sooner if needed. Patient verbalizes understanding regarding plan of care and all questions answered.    Other orders -     Pantoprazole Sodium; Take 1 tablet (40 mg total) by mouth 2 (two) times daily.  Dispense: 60 tablet; Refill: 3    Return in about 3 months (around 07/03/2023), or if at home B/P readings are elevated come sooner, for chronic follow-up, hypertension.   Cruzita Lederer Newman Nip, FNP

## 2023-04-02 NOTE — Anesthesia Postprocedure Evaluation (Signed)
Anesthesia Post Note  Patient: Clayton Peterson  Procedure(s) Performed: ESOPHAGOGASTRODUODENOSCOPY (EGD) WITH PROPOFOL STENT REMOVAL     Patient location during evaluation: Endoscopy Anesthesia Type: MAC Level of consciousness: awake and alert Pain management: pain level controlled Vital Signs Assessment: post-procedure vital signs reviewed and stable Respiratory status: spontaneous breathing, nonlabored ventilation, respiratory function stable and patient connected to nasal cannula oxygen Cardiovascular status: blood pressure returned to baseline and stable Postop Assessment: no apparent nausea or vomiting Anesthetic complications: no   No notable events documented.  Last Vitals:  Vitals:   04/02/23 1450 04/02/23 1500  BP: (!) 130/46 (!) 141/62  Pulse: (!) 53 (!) 48  Resp: (!) 26 (!) 24  Temp:    SpO2: 96% 95%    Last Pain:  Vitals:   04/02/23 1500  TempSrc:   PainSc: 0-No pain                 Trevor Iha

## 2023-04-02 NOTE — Transfer of Care (Signed)
Immediate Anesthesia Transfer of Care Note  Patient: Clayton Peterson  Procedure(s) Performed: ESOPHAGOGASTRODUODENOSCOPY (EGD) WITH PROPOFOL STENT REMOVAL  Patient Location: PACU  Anesthesia Type:MAC  Level of Consciousness: drowsy  Airway & Oxygen Therapy: Patient Spontanous Breathing and Patient connected to face mask oxygen  Post-op Assessment: Report given to RN, Post -op Vital signs reviewed and stable, and Patient moving all extremities X 4  Post vital signs: Reviewed and stable  Last Vitals:  Vitals Value Taken Time  BP 119/54   Temp    Pulse 53   Resp 12   SpO2 100     Last Pain:  Vitals:   04/02/23 1235  PainSc: 0-No pain         Complications: No notable events documented.

## 2023-04-02 NOTE — H&P (Addendum)
GASTROENTEROLOGY PROCEDURE H&P NOTE   Primary Care Physician: Rica Records, FNP  HPI: Clayton Peterson is a 62 y.o. male who presents for EGD for AXIOS cystgastrostomy stent removal with prior history of pancreatitis and necrosis.  Past Medical History:  Diagnosis Date   AAA (abdominal aortic aneurysm) (HCC)    Coronary artery disease    Diabetes mellitus without complication (HCC)    Heart attack (HCC)    High cholesterol    Hypertension    Past Surgical History:  Procedure Laterality Date   ABDOMINAL AORTIC ANEURYSM REPAIR     BACK SURGERY     x2   BIOPSY  06/03/2021   Procedure: BIOPSY;  Surgeon: Lanelle Bal, DO;  [facility]: [facility];  Service: Endoscopy;;   BIOPSY  02/09/2023   Procedure: BIOPSY;  Surgeon: Benancio Deeds, MD;  Location: [facility]OS[facility];  Service: Gastroenterology;;   CARDIAC CATHETERIZATION     stent placed on right side ofo heart   COLONOSCOPY WITH PROPOFOL N/A 06/03/2021   Surgeon: Lanelle Bal, DO; Nonbleeding internal hemorrhoids, 6 mm polyp in sigmoid colon, 2 mm polyp in the ascending colon, congested and erythematous mucosa in the cecum biopsied.  Pathology revealed 2 tubular adenomas, cecal biopsy with polypoid colonic mucosa with ectatic vascular channels with small vascular malformation not ruled out. Repeat in 5 years.   CYST ENTEROSTOMY N/A 02/13/2023   Procedure: CYST GASTROSTOMY;  Surgeon: Meridee Score Netty Starring., MD;  Location: [facility] ENDOSCOPY;  Service: Gastroenterology;  Laterality: N/A;   ESOPHAGOGASTRODUODENOSCOPY N/A 02/11/2023   Procedure: ESOPHAGOGASTRODUODENOSCOPY (EGD);  Surgeon: Jeani Hawking, MD;  Location: [facility] ENDOSCOPY;  Service: Gastroenterology;  Laterality: N/A;   ESOPHAGOGASTRODUODENOSCOPY (EGD) WITH PROPOFOL N/A 06/03/2021   Surgeon: Lanelle Bal, DO; Gastritis biopsied, nonobstructing, nonbleeding duodenal ulcer with no stigmata of bleeding, NSAID induced etiology.  Pathology with chronic  gastritis, negative for H. pylori.   ESOPHAGOGASTRODUODENOSCOPY (EGD) WITH PROPOFOL N/A 02/09/2023   Procedure: ESOPHAGOGASTRODUODENOSCOPY (EGD) WITH PROPOFOL;  Surgeon: Benancio Deeds, MD;  Location: [facility] ENDOSCOPY;  Service: Gastroenterology;  Laterality: N/A;   ESOPHAGOGASTRODUODENOSCOPY (EGD) WITH PROPOFOL N/A 02/13/2023   Procedure: ESOPHAGOGASTRODUODENOSCOPY (EGD) WITH PROPOFOL;  Surgeon: Meridee Score Netty Starring., MD;  Location: [facility] ENDOSCOPY;  Service: Gastroenterology;  Laterality: N/A;   ESOPHAGOGASTRODUODENOSCOPY (EGD) WITH PROPOFOL N/A 02/22/2023   Procedure: ESOPHAGOGASTRODUODENOSCOPY (EGD) WITH PROPOFOL;  Surgeon: Meridee Score Netty Starring., MD;  Location: [facility] ENDOSCOPY;  Service: Gastroenterology;  Laterality: N/A;   EUS Left 02/11/2023   Procedure: UPPER ENDOSCOPIC ULTRASOUND (EUS) LINEAR;  Surgeon: Jeani Hawking, MD;  Location: [facility] ENDOSCOPY;  Service: Gastroenterology;  Laterality: Left;   EUS N/A 02/13/2023   Procedure: UPPER ENDOSCOPIC ULTRASOUND (EUS) LINEAR;  Surgeon: Lemar Lofty., MD;  Location: [facility] ENDOSCOPY;  Service: Gastroenterology;  Laterality: N/A;   KNEE SURGERY Left    LEFT HEART CATH AND CORONARY ANGIOGRAPHY N/A 02/07/2023   Procedure: LEFT HEART CATH AND CORONARY ANGIOGRAPHY;  Surgeon: Kathleene Hazel, MD;  Location: [facility] INVASIVE CV LAB;  Service: Cardiovascular;  Laterality: N/A;   PANCREATIC STENT PLACEMENT  02/13/2023   Procedure: PANCREATIC STENT PLACEMENT;  Surgeon: Meridee Score Netty Starring., MD;  Location: [facility] ENDOSCOPY;  Service: Gastroenterology;;   PANCREATIC STENT PLACEMENT  02/22/2023   Procedure: PANCREATIC STENT PLACEMENT;  Surgeon: Lemar Lofty., MD;  Location: [facility] ENDOSCOPY;  Service: Gastroenterology;;   PARTIAL COLECTOMY     POLYPECTOMY  06/03/2021   Procedure: POLYPECTOMY INTESTINAL;  Surgeon: Lanelle Bal, DO;  [facility]: [facility];  Service: Endoscopy;;   STENT REMOVAL  02/22/2023   Procedure: STENT REMOVAL;  Surgeon:  Lemar Lofty., MD;  Location: [facility] ENDOSCOPY;  Service: Gastroenterology;;   No current facility-administered medications for this encounter.   No current facility-administered medications for this encounter. Allergies  Allergen Reactions   Bee Venom Anaphylaxis   Coconut (Cocos Nucifera) Anaphylaxis   Family History  Problem Relation Age of Onset   Atrial fibrillation Mother    Alzheimer's disease Mother    Diabetes Brother    Alzheimer's disease Maternal Grandmother    Aneurysm Maternal Grandfather    Pancreatitis Neg Hx    Pancreatic cancer Neg Hx    Colon cancer Neg Hx    Social History   Socioeconomic History   Marital status: Married    Spouse name: Not on file   Number of children: Not on file   Years of education: Not on file   Highest education level: Associate degree: occupational, Scientist, product/process development, or vocational program  Occupational History   Occupation: disabled, former truck driver  Tobacco Use   Smoking status: Every Day    Packs/day: 1.50    Years: 40.00    Additional pack years: 0.00    Total pack years: 60.00    Types: Cigarettes    Passive exposure: Current   Smokeless tobacco: Never   Tobacco comments:    Smokes 0.5 PPD--03/15/2023.  Vaping Use   Vaping Use: Never used  Substance and Sexual Activity   Alcohol use: Yes    Comment: very seldom   Drug use: Never   Sexual activity: Yes  Other Topics Concern   Not on file  Social History Narrative   Not on file   Social Determinants of Health   Financial Resource Strain: Low Risk  (03/29/2023)   Overall Financial Resource Strain (CARDIA)    Difficulty of Paying Living Expenses: Not hard at all  Food Insecurity: No Food Insecurity (03/29/2023)   Hunger Vital Sign    Worried About Running Out of Food in the Last Year: Never true    Ran Out of Food in the Last Year: Never true  Transportation Needs: No Transportation Needs (03/29/2023)   PRAPARE - Administrator, Civil Service  (Medical): No    Lack of Transportation (Non-Medical): No  Physical Activity: Insufficiently Active (03/29/2023)   Exercise Vital Sign    Days of Exercise per Week: 2 days    Minutes of Exercise per Session: 30 min  Stress: No Stress Concern Present (03/29/2023)   [employer] of Occupational Health - Occupational Stress Questionnaire    Feeling of Stress : Not at all  Social Connections: Not on file  Intimate Partner Violence: Not At Risk (02/06/2023)   Humiliation, Afraid, Rape, and Kick questionnaire    Fear of Current or Ex-Partner: No    Emotionally Abused: No    Physically Abused: No    Sexually Abused: No    Physical Exam: There were no vitals filed for this visit. There is no height or weight on file to calculate BMI. GEN: NAD EYE: Sclerae anicteric ENT: MMM CV: Non-tachycardic GI: Soft, TTP in MEG 2-3/10 currently preprocedure NEURO:  Alert & Oriented x 3  Lab Results: No results for input(s): "WBC", "HGB", "HCT", "PLT" in the last 72 hours. BMET No results for input(s): "NA", "K", "CL", "CO2", "GLUCOSE", "BUN", "CREATININE", "CALCIUM" in the last 72 hours. LFT No results for input(s): "PROT", "ALBUMIN", "AST", "ALT", "ALKPHOS", "BILITOT", "BILIDIR", "IBILI" in the last  72 hours. PT/INR No results for input(s): "LABPROT", "INR" in the last 72 hours.   Impression / Plan: This is a 62 y.o.male who presents for EGD for AXIOS cystgastrostomy stent removal with prior history of pancreatitis and necrosis.  The risks and benefits of endoscopic evaluation/treatment were discussed with the patient and/or family; these include but are not limited to the risk of perforation, infection, bleeding, missed lesions, lack of diagnosis, severe illness requiring hospitalization, as well as anesthesia and sedation related illnesses.  The patient's history has been reviewed, patient examined, no change in status, and deemed stable for procedure.  The patient and/or family is agreeable to  proceed.    Corliss Parish, MD [facility] Advanced [facility]oscopy Office # [phone]

## 2023-04-03 LAB — LIPID PANEL
Chol/HDL Ratio: 4.3 ratio (ref 0.0–5.0)
Cholesterol, Total: 129 mg/dL (ref 100–199)
HDL: 30 mg/dL — ABNORMAL LOW (ref >39)
LDL Chol Calc (NIH): 72 mg/dL (ref 0–99)
Triglycerides: 157 mg/dL — ABNORMAL HIGH (ref 0–149)
VLDL Cholesterol Cal: 27 mg/dL (ref 5–40)

## 2023-04-03 LAB — CBC WITH DIFFERENTIAL/PLATELET
Basophils Absolute: 0.1 10*3/uL (ref 0.0–0.2)
EOS (ABSOLUTE): 0.3 10*3/uL (ref 0.0–0.4)
Lymphs: 17 %
Neutrophils Absolute: 5.6 10*3/uL (ref 1.4–7.0)

## 2023-04-03 LAB — MICROALBUMIN / CREATININE URINE RATIO

## 2023-04-03 LAB — CMP14+EGFR
ALT: 10 IU/L (ref 0–44)
Alkaline Phosphatase: 67 IU/L (ref 44–121)
Creatinine, Ser: 1.29 mg/dL — ABNORMAL HIGH (ref 0.76–1.27)

## 2023-04-03 LAB — VITAMIN D 25 HYDROXY (VIT D DEFICIENCY, FRACTURES): Vit D, 25-Hydroxy: 29.2 ng/mL — ABNORMAL LOW (ref 30.0–100.0)

## 2023-04-04 ENCOUNTER — Telehealth: Payer: Self-pay | Admitting: Family Medicine

## 2023-04-04 ENCOUNTER — Encounter (HOSPITAL_COMMUNITY): Payer: Self-pay | Admitting: Gastroenterology

## 2023-04-04 LAB — HEMOGLOBIN A1C
Est. average glucose Bld gHb Est-mCnc: 169 mg/dL
Hgb A1c MFr Bld: 7.5 % — ABNORMAL HIGH (ref 4.8–5.6)

## 2023-04-04 LAB — CMP14+EGFR
AST: 11 IU/L (ref 0–40)
Albumin/Globulin Ratio: 2.2
Albumin: 4.2 g/dL (ref 3.9–4.9)
BUN/Creatinine Ratio: 16 (ref 10–24)
BUN: 20 mg/dL (ref 8–27)
Bilirubin Total: 0.3 mg/dL (ref 0.0–1.2)
CO2: 22 mmol/L (ref 20–29)
Calcium: 9.3 mg/dL (ref 8.6–10.2)
Chloride: 102 mmol/L (ref 96–106)
Globulin, Total: 1.9 g/dL (ref 1.5–4.5)
Glucose: 165 mg/dL — ABNORMAL HIGH (ref 70–99)
Potassium: 4.9 mmol/L (ref 3.5–5.2)
Sodium: 139 mmol/L (ref 134–144)
Total Protein: 6.1 g/dL (ref 6.0–8.5)
eGFR: 63 mL/min/{1.73_m2} (ref >59)

## 2023-04-04 LAB — CBC WITH DIFFERENTIAL/PLATELET
Basos: 1 %
Eos: 4 %
Hematocrit: 47.1 % (ref 37.5–51.0)
Hemoglobin: 14.8 g/dL (ref 13.0–17.7)
Immature Grans (Abs): 0 10*3/uL (ref 0.0–0.1)
Immature Granulocytes: 0 %
Lymphocytes Absolute: 1.4 10*3/uL (ref 0.7–3.1)
MCH: 28 pg (ref 26.6–33.0)
MCHC: 31.4 g/dL — ABNORMAL LOW (ref 31.5–35.7)
MCV: 89 fL (ref 79–97)
Monocytes Absolute: 0.7 10*3/uL (ref 0.1–0.9)
Monocytes: 8 %
Neutrophils: 70 %
Platelets: 128 10*3/uL — ABNORMAL LOW (ref 150–450)
RBC: 5.29 x10E6/uL (ref 4.14–5.80)
RDW: 15.1 % (ref 11.6–15.4)
WBC: 8 10*3/uL (ref 3.4–10.8)

## 2023-04-04 LAB — MICROALBUMIN / CREATININE URINE RATIO
Creatinine, Urine: 107.4 mg/dL
Microalb/Creat Ratio: 274 mg/g creat — ABNORMAL HIGH (ref 0–29)

## 2023-04-04 LAB — VITAMIN B12: Vitamin B-12: 450 pg/mL (ref 232–1245)

## 2023-04-04 MED ORDER — ROSUVASTATIN CALCIUM 40 MG PO TABS: 40.0000 mg | ORAL_TABLET | Freq: Every day | ORAL | 1 refills | Status: DC

## 2023-04-04 NOTE — Telephone Encounter (Signed)
Prescription Request  04/04/2023  LOV: 04/02/2023  What is the name of the medication or equipment? rosuvastatin (CRESTOR) 40 MG tablet   Have you contacted your pharmacy to request a refill? Yes   Which pharmacy would you like this sent to?  [facility] [address] [place] - [address] [place] [address] Phone: [phone] Fax: [phone]    Patient notified that their request is being sent to the clinical staff for review and that they should receive a response within 2 business days.   Please advise at Mobile [phone] (mobile)

## 2023-04-09 ENCOUNTER — Encounter: Payer: Self-pay | Admitting: Nutrition

## 2023-04-09 ENCOUNTER — Encounter: Payer: PPO | Attending: Family Medicine | Admitting: Nutrition

## 2023-04-09 DIAGNOSIS — E782 Mixed hyperlipidemia: Secondary | ICD-10-CM | POA: Diagnosis present

## 2023-04-09 DIAGNOSIS — E1159 Type 2 diabetes mellitus with other circulatory complications: Secondary | ICD-10-CM | POA: Insufficient documentation

## 2023-04-09 DIAGNOSIS — N1832 Chronic kidney disease, stage 3b: Secondary | ICD-10-CM | POA: Diagnosis present

## 2023-04-09 NOTE — Patient Instructions (Signed)
Goals  Get in 3 carb choices (45 grams) per meal Aim for FBS less than 130 in the am and bedtime blood sugar less than 150 mg/dl. Keep trying to walking. Focus on whole plant based foods Get A1C down to 7%

## 2023-04-09 NOTE — Progress Notes (Signed)
Medical Nutrition Therapy  Appointment Start time:  0800  Appointment End time:  0900  Primary concerns today: DM Type 2  Referral diagnosis: E11.8 Preferred learning style: No preference Learning readiness: Ready   NUTRITION ASSESSMENT  Here with his wife. 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 Feels better.  He is hoping it will help improve his BS now. Working on eating more foods from a garden.  A1C 7.5%  Has Josephine Igo now. TIR 72%, 28% high, some of them this past week when he had his GI procedure. Sees Dr. Fransico Him, Endocrinology.  May not be getting in enough CHO at times and causing extra hunger between meals and snacking.   He is willing to work eating more whole plant based meals to improve his DM and CAD.  Anthropometrics  Wt Readings from Last 3 Encounters:  04/02/23 203 lb (92.1 kg)  04/02/23 203 lb (92.1 kg)  03/15/23 205 lb 8 oz (93.2 kg)   Ht Readings from Last 3 Encounters:  04/02/23 5\' 9"  (1.753 m)  04/02/23 5\' 9"  (1.753 m)  03/15/23 5\' 9"  (1.753 m)   There is no height or weight on file to calculate BMI. @BMIFA @ Facility age limit for growth %iles is 20 years. Facility age limit for growth %iles is 20 years.    Clinical Medical Hx: See chart Medications: See chart Labs:  Lab Results  Component Value Date   HGBA1C 7.5 (H) 04/02/2023    Notable Signs/Symptoms: fatigue, lack of energy, thirsty sometimes.  Lifestyle & Dietary Hx Lives with his wife.  Estimated daily fluid intake: 60 oz Supplements:  Sleep: food Stress / self-care: no issues Current average weekly physical activity: Walks some  24-Hr Dietary Recall B) Eggs, toast or oatmeal L) leftovers or sandwich, fruit, water and sometimes 1/2 1/2 tea D) CHicken, corn, green beans and cabbage, water, 1/2 1/2 tea Snack: cabbage or fruit water  Estimated Energy Needs Calories: 2000 Carbohydrate: 225g Protein: 150g Fat: 56g   NUTRITION DIAGNOSIS   NB-1.1 Food and nutrition-related knowledge deficit As related to Diabetes Type 2.  As evidenced by A1C 8.7%..   NUTRITION INTERVENTION  Nutrition education (E-1) on the following topics:  Nutrition and Diabetes education provided on My Plate, CHO counting, meal planning, portion sizes, timing of meals, avoiding snacks between meals unless having a low blood sugar, target ranges for A1C and blood sugars, signs/symptoms and treatment of hyper/hypoglycemia, monitoring blood sugars, taking medications as prescribed, benefits of exercising 30 minutes per day and prevention of complications of DM.  Lifestyle Medicine  - Whole Food, Plant Predominant Nutrition is highly recommended: Eat Plenty of vegetables, Mushrooms, fruits, Legumes, Whole Grains, Nuts, seeds in lieu of processed meats, processed snacks/pastries red meat, poultry, eggs.    -It is better to avoid simple carbohydrates including: Cakes, Sweet Desserts, Ice Cream, Soda (diet and regular), Sweet Tea, Candies, Chips, Cookies, Store Bought Juices, Alcohol in Excess of  1-2 drinks a day, Lemonade,  Artificial Sweeteners, Doughnuts, Coffee Creamers, "Sugar-free" Products, etc, etc.  This is not a complete list.....  Exercise: If you are able: 30 -60 minutes a day ,4 days a week, or 150 minutes a week.  The longer the better.  Combine stretch, strength, and aerobic activities.  If you were told in the past that you have high risk for cardiovascular diseases, you may seek evaluation by your heart doctor prior to initiating moderate to intense exercise programs.   Handouts Provided Include  LIfestyle Medicine  Learning Style & Readiness for Change Teaching method utilized: Visual & Auditory  Demonstrated degree of understanding via: Teach Back  Barriers to learning/adherence to lifestyle change: None  Goals Established by Pt  Get in 3 carb choices (45 grams) per meal Aim for FBS less than 130 in the am and bedtime blood sugar less than  150 mg/dl. Keep trying to walk or chair exercises for activity.. Focus on whole plant based foods Get A1C down to 7%  MONITORING & EVALUATION Dietary intake, weekly physical activity, and Blood sugars in 3 month.  Next Steps  Patient is to work on eating more whole plant based foods.Marland Kitchen

## 2023-04-12 ENCOUNTER — Other Ambulatory Visit: Payer: Self-pay | Admitting: Family Medicine

## 2023-04-16 ENCOUNTER — Other Ambulatory Visit: Payer: Self-pay | Admitting: Family Medicine

## 2023-04-16 DIAGNOSIS — Z794 Long term (current) use of insulin: Secondary | ICD-10-CM

## 2023-04-18 ENCOUNTER — Ambulatory Visit (INDEPENDENT_AMBULATORY_CARE_PROVIDER_SITE_OTHER): Payer: PPO

## 2023-04-18 VITALS — Ht 69.0 in | Wt 200.0 lb

## 2023-04-18 DIAGNOSIS — Z0000 Encounter for general adult medical examination without abnormal findings: Secondary | ICD-10-CM

## 2023-04-18 NOTE — Patient Instructions (Signed)
Clayton Peterson , Thank you for taking time to come for your Medicare Wellness Visit. I appreciate your ongoing commitment to your health goals. Please review the following plan we discussed and let me know if I can assist you in the future.   These are the goals we discussed:  Goals      Patient Stated     Patients goes is to maintain his current activity level        This is a list of the screening recommended for you and due dates:  Health Maintenance  Topic Date Due   COVID-19 Vaccine (1) Never done   Eye exam for diabetics  Never done   DTaP/Tdap/Td vaccine (1 - Tdap) Never done   Zoster (Shingles) Vaccine (1 of 2) Never done   Flu Shot  05/24/2023   Hemoglobin A1C  10/02/2023   Complete foot exam   12/28/2023   Screening for Lung Cancer  02/06/2024   Yearly kidney function blood test for diabetes  04/01/2024   Yearly kidney health urinalysis for diabetes  04/01/2024   Medicare Annual Wellness Visit  04/17/2024   Colon Cancer Screening  06/04/2031   Hepatitis C Screening  Completed   HIV Screening  Completed   HPV Vaccine  Aged Out    Advanced directives: Advance directive discussed with you today. Even though you declined this today, please call our office should you change your mind, and we can give you the proper paperwork for you to fill out. Advance care planning is a way to make decisions about medical care that fits your values in case you are ever unable to make these decisions for yourself.  Information on Advanced Care Planning can be found at [facility] [place] Advance Health Care Directives Advance Health Care Directives ([email or web address]    Conditions/risks identified: You applied for a [employer] discount card during your visit today. If you have any issues or questions please send [place] a message via mychart  Next appointment: VIRTUAL/ TELEPHONE VISIT Follow up in one year for your annual wellness visit  May 21, 2024 at 2:30pm   Preventive Care 40-64  Years, Male Preventive care refers to lifestyle choices and visits with your health care provider that can promote health and wellness. What does preventive care include? A yearly physical exam. This is also called an annual well check. Dental exams once or twice a year. Routine eye exams. Ask your health care provider how often you should have your eyes checked. Personal lifestyle choices, including: Daily care of your teeth and gums. Regular physical activity. Eating a healthy diet. Avoiding tobacco and drug use. Limiting alcohol use. Practicing safe sex. Taking low-dose aspirin every day starting at age 61. What happens during an annual well check? The services and screenings done by your health care provider during your annual well check will depend on your age, overall health, lifestyle risk factors, and family history of disease. Counseling  Your health care provider may ask you questions about your: Alcohol use. Tobacco use. Drug use. Emotional well-being. Home and relationship well-being. Sexual activity. Eating habits. Work and work Astronomer. Screening  You may have the following tests or measurements: Height, weight, and BMI. Blood pressure. Lipid and cholesterol levels. These may be checked every 5 years, or more frequently if you are over 75 years old. Skin check. Lung cancer screening. You may have this screening every year starting at age 59 if you have a 30-pack-year history of smoking and currently smoke  or have quit within the past 15 years. Fecal occult blood test (FOBT) of the stool. You may have this test every year starting at age 21. Flexible sigmoidoscopy or colonoscopy. You may have a sigmoidoscopy every 5 years or a colonoscopy every 10 years starting at age 15. Prostate cancer screening. Recommendations will vary depending on your family history and other risks. Hepatitis C blood test. Hepatitis B blood test. Sexually transmitted disease (STD)  testing. Diabetes screening. This is done by checking your blood sugar (glucose) after you have not eaten for a while (fasting). You may have this done every 1-3 years. Discuss your test results, treatment options, and if necessary, the need for more tests with your health care provider. Vaccines  Your health care provider may recommend certain vaccines, such as: Influenza vaccine. This is recommended every year. Tetanus, diphtheria, and acellular pertussis (Tdap, Td) vaccine. You may need a Td booster every 10 years. Zoster vaccine. You may need this after age 34. Pneumococcal 13-valent conjugate (PCV13) vaccine. You may need this if you have certain conditions and have not been vaccinated. Pneumococcal polysaccharide (PPSV23) vaccine. You may need one or two doses if you smoke cigarettes or if you have certain conditions. Talk to your health care provider about which screenings and vaccines you need and how often you need them. This information is not intended to replace advice given to you by your health care provider. Make sure you discuss any questions you have with your health care provider. Document Released: 11/05/2015 Document Revised: 06/28/2016 Document Reviewed: 08/10/2015 Elsevier Interactive Patient Education  2017 [employer].  Fall Prevention in the Home Falls can cause injuries. They can happen to people of all ages. There are many things you can do to make your home safe and to help prevent falls. What can I do on the outside of my home? Regularly fix the edges of walkways and driveways and fix any cracks. Remove anything that might make you trip as you walk through a door, such as a raised step or threshold. Trim any bushes or trees on the path to your home. Use bright outdoor lighting. Clear any walking paths of anything that might make someone trip, such as rocks or tools. Regularly check to see if handrails are loose or broken. Make sure that both sides of any steps  have handrails. Any raised decks and porches should have guardrails on the edges. Have any leaves, snow, or ice cleared regularly. Use sand or salt on walking paths during winter. Clean up any spills in your garage right away. This includes oil or grease spills. What can I do in the bathroom? Use night lights. Install grab bars by the toilet and in the tub and shower. Do not use towel bars as grab bars. Use non-skid mats or decals in the tub or shower. If you need to sit down in the shower, use a plastic, non-slip stool. Keep the floor dry. Clean up any water that spills on the floor as soon as it happens. Remove soap buildup in the tub or shower regularly. Attach bath mats securely with double-sided non-slip rug tape. Do not have throw rugs and other things on the floor that can make you trip. What can I do in the bedroom? Use night lights. Make sure that you have a light by your bed that is easy to reach. Do not use any sheets or blankets that are too big for your bed. They should not hang down onto the floor. Have  a firm chair that has side arms. You can use this for support while you get dressed. Do not have throw rugs and other things on the floor that can make you trip. What can I do in the kitchen? Clean up any spills right away. Avoid walking on wet floors. Keep items that you use a lot in easy-to-reach places. If you need to reach something above you, use a strong step stool that has a grab bar. Keep electrical cords out of the way. Do not use floor polish or wax that makes floors slippery. If you must use wax, use non-skid floor wax. Do not have throw rugs and other things on the floor that can make you trip. What can I do with my stairs? Do not leave any items on the stairs. Make sure that there are handrails on both sides of the stairs and use them. Fix handrails that are broken or loose. Make sure that handrails are as long as the stairways. Check any carpeting to make  sure that it is firmly attached to the stairs. Fix any carpet that is loose or worn. Avoid having throw rugs at the top or bottom of the stairs. If you do have throw rugs, attach them to the floor with carpet tape. Make sure that you have a light switch at the top of the stairs and the bottom of the stairs. If you do not have them, ask someone to add them for you. What else can I do to help prevent falls? Wear shoes that: Do not have high heels. Have rubber bottoms. Are comfortable and fit you well. Are closed at the toe. Do not wear sandals. If you use a stepladder: Make sure that it is fully opened. Do not climb a closed stepladder. Make sure that both sides of the stepladder are locked into place. Ask someone to hold it for you, if possible. Clearly mark and make sure that you can see: Any grab bars or handrails. First and last steps. Where the edge of each step is. Use tools that help you move around (mobility aids) if they are needed. These include: Canes. Walkers. Scooters. Crutches. Turn on the lights when you go into a dark area. Replace any light bulbs as soon as they burn out. Set up your furniture so you have a clear path. Avoid moving your furniture around. If any of your floors are uneven, fix them. If there are any pets around you, be aware of where they are. Review your medicines with your doctor. Some medicines can make you feel dizzy. This can increase your chance of falling. Ask your doctor what other things that you can do to help prevent falls. This information is not intended to replace advice given to you by your health care provider. Make sure you discuss any questions you have with your health care provider. Document Released: 08/05/2009 Document Revised: 03/16/2016 Document Reviewed: 11/13/2014 Elsevier Interactive Patient Education  2017 [employer].
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
## 2023-04-19 ENCOUNTER — Encounter: Payer: Self-pay | Admitting: "Endocrinology

## 2023-04-19 ENCOUNTER — Ambulatory Visit (INDEPENDENT_AMBULATORY_CARE_PROVIDER_SITE_OTHER): Payer: PPO | Admitting: "Endocrinology

## 2023-04-19 VITALS — BP 136/56 | HR 52 | Ht 69.0 in | Wt 201.4 lb

## 2023-04-19 DIAGNOSIS — E782 Mixed hyperlipidemia: Secondary | ICD-10-CM | POA: Diagnosis not present

## 2023-04-19 DIAGNOSIS — I10 Essential (primary) hypertension: Secondary | ICD-10-CM

## 2023-04-19 DIAGNOSIS — E1159 Type 2 diabetes mellitus with other circulatory complications: Secondary | ICD-10-CM | POA: Diagnosis not present

## 2023-04-19 DIAGNOSIS — Z794 Long term (current) use of insulin: Secondary | ICD-10-CM

## 2023-04-19 DIAGNOSIS — I152 Hypertension secondary to endocrine disorders: Secondary | ICD-10-CM

## 2023-04-19 DIAGNOSIS — Z7984 Long term (current) use of oral hypoglycemic drugs: Secondary | ICD-10-CM | POA: Diagnosis not present

## 2023-04-19 DIAGNOSIS — I2510 Atherosclerotic heart disease of native coronary artery without angina pectoris: Secondary | ICD-10-CM

## 2023-04-19 MED ORDER — GLIPIZIDE ER 5 MG PO TB24: 5.0000 mg | ORAL_TABLET | Freq: Every day | ORAL | 1 refills | Status: DC

## 2023-04-19 NOTE — Progress Notes (Signed)
Endocrinology Consult Note       04/19/2023, 2:23 PM   Subjective:    Patient ID: Clayton Peterson, male    DOB: [birth year].  Clayton Peterson is being seen in consultation for management of currently uncontrolled symptomatic diabetes requested by  Rica Records, FNP.   Past Medical History:  Diagnosis Date   AAA (abdominal aortic aneurysm) (HCC)    Coronary artery disease    Diabetes mellitus without complication (HCC)    Heart attack (HCC)    High cholesterol    Hypertension     Past Surgical History:  Procedure Laterality Date   ABDOMINAL AORTIC ANEURYSM REPAIR     BACK SURGERY     x2   BIOPSY  06/03/2021   Procedure: BIOPSY;  Surgeon: Lanelle Bal, DO;  [facility]: [facility];  Service: Endoscopy;;   BIOPSY  02/09/2023   Procedure: BIOPSY;  Surgeon: Benancio Deeds, MD;  Location: [facility];  Service: Gastroenterology;;   CARDIAC CATHETERIZATION     stent placed on right side ofo heart   COLONOSCOPY WITH PROPOFOL N/A 06/03/2021   Surgeon: Lanelle Bal, DO; Nonbleeding internal hemorrhoids, 6 mm polyp in sigmoid colon, 2 mm polyp in the ascending colon, congested and erythematous mucosa in the cecum biopsied.  Pathology revealed 2 tubular adenomas, cecal biopsy with polypoid colonic mucosa with ectatic vascular channels with small vascular malformation not ruled out. Repeat in 5 years.   CYST ENTEROSTOMY N/A 02/13/2023   Procedure: CYST GASTROSTOMY;  Surgeon: Meridee Score Netty Starring., MD;  Location: [facility] ENDOSCOPY;  Service: Gastroenterology;  Laterality: N/A;   CYST ENTEROSTOMY  04/02/2023   Procedure: CYST NECROSECTOMY;  Surgeon: Meridee Score Netty Starring., MD;  Location: [facility] ENDOSCOPY;  Service: Gastroenterology;;   ESOPHAGOGASTRODUODENOSCOPY N/A 02/11/2023   Procedure: ESOPHAGOGASTRODUODENOSCOPY (EGD);  Surgeon: Jeani Hawking, MD;  Location: [facility] ENDOSCOPY;  Service:  Gastroenterology;  Laterality: N/A;   ESOPHAGOGASTRODUODENOSCOPY (EGD) WITH PROPOFOL N/A 06/03/2021   Surgeon: Lanelle Bal, DO; Gastritis biopsied, nonobstructing, nonbleeding duodenal ulcer with no stigmata of bleeding, NSAID induced etiology.  Pathology with chronic gastritis, negative for H. pylori.   ESOPHAGOGASTRODUODENOSCOPY (EGD) WITH PROPOFOL N/A 02/09/2023   Procedure: ESOPHAGOGASTRODUODENOSCOPY (EGD) WITH PROPOFOL;  Surgeon: Benancio Deeds, MD;  Location: [facility] ENDOSCOPY;  Service: Gastroenterology;  Laterality: N/A;   ESOPHAGOGASTRODUODENOSCOPY (EGD) WITH PROPOFOL N/A 02/13/2023   Procedure: ESOPHAGOGASTRODUODENOSCOPY (EGD) WITH PROPOFOL;  Surgeon: Meridee Score Netty Starring., MD;  Location: [facility] ENDOSCOPY;  Service: Gastroenterology;  Laterality: N/A;   ESOPHAGOGASTRODUODENOSCOPY (EGD) WITH PROPOFOL N/A 02/22/2023   Procedure: ESOPHAGOGASTRODUODENOSCOPY (EGD) WITH PROPOFOL;  Surgeon: Meridee Score Netty Starring., MD;  Location: [facility] ENDOS[facility];  Service: Gastroenterology;  Laterality: N/A;   ESOPHAGOGASTRODUODENOSCOPY (EGD) WITH PROPOFOL N/A 04/02/2023   Procedure: ESOPHAGOGASTRODUODENOSCOPY (EGD) WITH PROPOFOL;  Surgeon: Meridee Score Netty Starring., MD;  Location: [facility] ENDOSCOPY;  Service: Gastroenterology;  Laterality: N/A;   EUS Left 02/11/2023   Procedure: UPPER ENDOSCOPIC ULTRASOUND (EUS) LINEAR;  Surgeon: Jeani Hawking, MD;  Location: [facility] ENDOSCOPY;  Service: Gastroenterology;  Laterality: Left;   EUS N/A 02/13/2023   Procedure: UPPER ENDOSCOPIC ULTRASOUND (EUS) LINEAR;  Surgeon: Lemar Lofty., MD;  Location:  [facility];  Service: Gastroenterology;  Laterality: N/A;   KNEE SURGERY Left    LEFT HEART CATH AND CORONARY ANGIOGRAPHY N/A 02/07/2023   Procedure: LEFT HEART CATH AND CORONARY ANGIOGRAPHY;  Surgeon: Kathleene Hazel, MD;  Location: [facility] INVASIVE CV LAB;  Service: Cardiovascular;  Laterality: N/A;   PANCREATIC STENT PLACEMENT  02/13/2023   Procedure: PANCREATIC STENT  PLACEMENT;  Surgeon: Meridee Score Netty Starring., MD;  Location: [facility] ENDOSCOPY;  Service: Gastroenterology;;   PANCREATIC STENT PLACEMENT  02/22/2023   Procedure: PANCREATIC STENT PLACEMENT;  Surgeon: Lemar Lofty., MD;  Location: [facility] ENDOSCOPY;  Service: Gastroenterology;;   PARTIAL COLECTOMY     POLYPECTOMY  06/03/2021   Procedure: POLYPECTOMY INTESTINAL;  Surgeon: Lanelle Bal, DO;  Location: [facility];  Service: Endoscopy;;   STENT REMOVAL  02/22/2023   Procedure: STENT REMOVAL;  Surgeon: Lemar Lofty., MD;  Location: [facility] ENDOSCOPY;  Service: Gastroenterology;;   Francine Graven REMOVAL  04/02/2023   Procedure: STENT REMOVAL;  Surgeon: Lemar Lofty., MD;  Location: [facility] ENDOSCOPY;  Service: Gastroenterology;;    Social History   Socioeconomic History   Marital status: Married    Spouse name: Not on file   Number of children: Not on file   Years of education: Not on file   Highest education level: Associate degree: occupational, Scientist, product/process development, or vocational program  Occupational History   Occupation: disabled, former truck driver  Tobacco Use   Smoking status: Every Day    Packs/day: 1.50    Years: 40.00    Additional pack years: 0.00    Total pack years: 60.00    Types: Cigarettes    Passive exposure: Current   Smokeless tobacco: Never   Tobacco comments:    Smokes 0.5 PPD--03/15/2023.  Vaping Use   Vaping Use: Never used  Substance and Sexual Activity   Alcohol use: Yes    Comment: very seldom   Drug use: Never   Sexual activity: Yes  Other Topics Concern   Not on file  Social History Narrative   Not on file   Social Determinants of Health   Financial Resource Strain: Low Risk  (04/18/2023)   Overall Financial Resource Strain (CARDIA)    Difficulty of Paying Living Expenses: Not hard at all  Food Insecurity: No Food Insecurity (04/18/2023)   Hunger Vital Sign    Worried About Running Out of Food in the Last Year: Never true    Ran Out of Food in the  Last Year: Never true  Transportation Needs: No Transportation Needs (04/18/2023)   PRAPARE - Administrator, Civil Service (Medical): No    Lack of Transportation (Non-Medical): No  Physical Activity: Insufficiently Active (04/18/2023)   Exercise Vital Sign    Days of Exercise per Week: 7 days    Minutes of Exercise per Session: 20 min  Stress: No Stress Concern Present (04/18/2023)   [employer] of Occupational Health - Occupational Stress Questionnaire    Feeling of Stress : Not at all  Social Connections: Moderately Isolated (04/18/2023)   Social Connection and Isolation Panel [NHANES]    Frequency of Communication with Friends and Family: More than three times a week    Frequency of Social Gatherings with Friends and Family: More than three times a week    Attends Religious Services: Never    Database administrator or Organizations: No    Attends Banker Meetings: Never    Marital Status: Married    Family History  Problem Relation Age of Onset   Atrial fibrillation Mother    Alzheimer's disease Mother    Diabetes Brother    Alzheimer's disease Maternal Grandmother    Aneurysm Maternal Grandfather    Pancreatitis Neg Hx    Pancreatic cancer Neg Hx    Colon cancer Neg Hx     Outpatient Encounter Medications as of 04/19/2023  Medication Sig   glipiZIDE (GLUCOTROL XL) 5 MG 24 hr tablet Take 1 tablet (5 mg total) by mouth daily with breakfast.   acetaminophen (TYLENOL) 650 MG CR tablet Take 1,300 mg by mouth every 8 (eight) hours as needed for pain.   aspirin 81 MG EC tablet Take 81 mg by mouth daily.   Cholecalciferol (VITAMIN D3) 125 MCG (5000 UT) TABS Take 1 tablet by mouth daily.   Continuous Glucose Receiver (FREESTYLE LIBRE 3 READER) DEVI 1 each by Does not apply route daily.   Continuous Glucose Sensor (FREESTYLE LIBRE 3 SENSOR) MISC Place 1 sensor on the skin every 14 days. Use to check glucose continuously   fenofibrate micronized (LOFIBRA)  200 MG capsule Take 200 mg by mouth daily before breakfast.   gabapentin (NEURONTIN) 100 MG capsule Take 1 capsule by mouth once daily   insulin glargine (LANTUS) 100 UNIT/ML injection Inject 20 Units into the skin at bedtime.   metFORMIN (GLUCOPHAGE) 1000 MG tablet Take 500 mg by mouth 2 (two) times daily with a meal.   metoprolol tartrate (LOPRESSOR) 50 MG tablet Take 1 tablet (50 mg total) by mouth 2 (two) times daily.   nitroGLYCERIN (NITROSTAT) 0.4 MG SL tablet Place 1 tablet (0.4 mg total) under the tongue every 5 (five) minutes as needed for chest pain.   Omega-3 Fatty Acids (OMEGA 3 PO) Take 2,000 mg by mouth 2 (two) times daily.   ONETOUCH ULTRA test strip USE 1 STRIP TO CHECK GLUCOSE THREE TIMES DAILY   pantoprazole (PROTONIX) 40 MG tablet Take 1 tablet (40 mg total) by mouth 2 (two) times daily.   RELION PEN NEEDLE 31G/8MM 31G X 8 MM MISC    rosuvastatin (CRESTOR) 40 MG tablet Take 1 tablet (40 mg total) by mouth at bedtime.   [DISCONTINUED] JARDIANCE 10 MG TABS tablet TAKE 1 TABLET BY MOUTH ONCE DAILY BEFORE BREAKFAST   No facility-administered encounter medications on file as of 04/19/2023.    ALLERGIES: Allergies  Allergen Reactions   Bee Venom Anaphylaxis   Coconut (Cocos Nucifera) Anaphylaxis    VACCINATION STATUS: Immunization History  Administered Date(s) Administered   Influenza Inj Mdck Quad Pf 08/31/2022   Influenza Split 01/09/2017, 09/13/2019    Diabetes He presents for his follow-up diabetic visit. He has type 2 diabetes mellitus. Onset time: He was diagnosed at approximate age of 28 years. His disease course has been improving. There are no hypoglycemic associated symptoms. Pertinent negatives for hypoglycemia include no confusion, headaches, pallor or seizures. Pertinent negatives for diabetes include no chest pain, no fatigue, no polydipsia, no polyphagia, no polyuria and no weakness. There are no hypoglycemic complications. Symptoms are improving. Diabetic  complications include heart disease and nephropathy. Risk factors for coronary artery disease include family history, dyslipidemia, diabetes mellitus, male sex, obesity, sedentary lifestyle and tobacco exposure. Current diabetic treatments: He is currently on an Lantus to 8 units twice daily Jardiance 10 mg p.o. daily, metformin 1000 p.o. twice daily and   glimepiride 4 mg p.o. twice daily. His weight is decreasing steadily. He never participates in exercise. His home blood glucose trend is decreasing  steadily. His breakfast blood glucose range is generally 140-180 mg/dl. His Peterson blood glucose range is generally 140-180 mg/dl. His dinner blood glucose range is generally 140-180 mg/dl. His bedtime blood glucose range is generally 140-180 mg/dl. His overall blood glucose range is 140-180 mg/dl. (He is benefiting from his CGM device.  AGP report shows 86% time range, 12% level 1 hyperglycemia, 1% level 2 hyperglycemia.  Did not document hypoglycemia.  His previsit labs show A1c of 7.5% improving from 8.7%.)  Hyperlipidemia This is a chronic problem. The current episode started more than 1 year ago. Exacerbating diseases include diabetes. Pertinent negatives include no chest pain or myalgias. Current antihyperlipidemic treatment includes statins. Risk factors for coronary artery disease include diabetes mellitus, dyslipidemia, hypertension, male sex and a sedentary lifestyle.     Review of Systems  Constitutional:  Negative for chills, fatigue, fever and unexpected weight change.  HENT:  Negative for dental problem, mouth sores and trouble swallowing.   Eyes:  Negative for visual disturbance.  Respiratory:  Negative for wheezing.   Cardiovascular:  Negative for chest pain, palpitations and leg swelling.  Gastrointestinal:  Negative for abdominal distention, abdominal pain, constipation, diarrhea, nausea and vomiting.  Endocrine: Negative for polydipsia, polyphagia and polyuria.  Genitourinary:  Negative  for dysuria, flank pain, hematuria and urgency.  Musculoskeletal:  Negative for back pain, gait problem, myalgias and neck pain.  Skin:  Negative for pallor, rash and wound.  Neurological:  Negative for seizures, syncope, weakness, numbness and headaches.  Psychiatric/Behavioral:  Negative for confusion and dysphoric mood.     Objective:       04/19/2023   12:59 PM 04/18/2023    9:08 AM 04/02/2023    3:00 PM  Vitals with BMI  Height 5\' 9"  5\' 9"    Weight 201 lbs 6 oz 200 lbs   BMI 29.73 29.52   Systolic 136  141  Diastolic 56  62  Pulse 52  48    BP (!) 136/56   Pulse (!) 52   Ht 5\' 9"  (1.753 m)   Wt 201 lb 6.4 oz (91.4 kg)   BMI 29.74 kg/m   Wt Readings from Last 3 Encounters:  04/19/23 201 lb 6.4 oz (91.4 kg)  04/18/23 200 lb (90.7 kg)  04/02/23 203 lb (92.1 kg)         CMP ( most recent) CMP     Component Value Date/Time   NA 139 04/02/2023 0943   K 4.9 04/02/2023 0943   CL 102 04/02/2023 0943   CO2 22 04/02/2023 0943   GLUCOSE 165 (H) 04/02/2023 0943   GLUCOSE 127 (H) 02/15/2023 0249   BUN 20 04/02/2023 0943   CREATININE 1.29 (H) 04/02/2023 0943   CALCIUM 9.3 04/02/2023 0943   PROT 6.1 04/02/2023 0943   ALBUMIN 4.2 04/02/2023 0943   AST 11 04/02/2023 0943   ALT 10 04/02/2023 0943   ALKPHOS 67 04/02/2023 0943   BILITOT 0.3 04/02/2023 0943   GFRNONAA 58 (L) 02/15/2023 0249     Diabetic Labs (most recent): Lab Results  Component Value Date   HGBA1C 7.5 (H) 04/02/2023   HGBA1C 8.7 (H) 12/29/2022   HGBA1C 8.0 (H) 02/14/2022     Lipid Panel ( most recent) Lipid Panel     Component Value Date/Time   CHOL 129 04/02/2023 0943   TRIG 157 (H) 04/02/2023 0943   HDL 30 (L) 04/02/2023 0943   CHOLHDL 4.3 04/02/2023 0943   CHOLHDL 5.7 02/15/2022 0406   VLDL UNABLE  TO CALCULATE IF TRIGLYCERIDE OVER 400 mg/dL 16/07/9603 5409   LDLCALC 72 04/02/2023 0943   LDLDIRECT 64 05/25/2022 0844   LDLDIRECT 26.0 02/15/2022 0406   LABVLDL 27 04/02/2023 0943       Lab Results  Component Value Date   TSH 1.380 12/29/2022   FREET4 1.34 12/29/2022     Assessment & Plan:   1. DM type 2 causing vascular disease (HCC)  - Clayton Peterson has currently uncontrolled symptomatic type 2 DM since  62 years of age. He is benefiting from his CGM device.  AGP report shows 86% time range, 12% level 1 hyperglycemia, 1% level 2 hyperglycemia.  Did not document hypoglycemia.  His previsit labs show A1c of 7.5% improving from 8.7%. -Recent labs reviewed. - I had a long discussion with him about the possible risk factors and  the pathology behind its diabetes and its complications. -his diabetes is complicated by coronary artery disease, CKD, chronic heavy smoking, comorbid hypertension hyperlipidemia, history of pancreatitis and he remains at a high risk for more acute and chronic complications which include CAD, CVA, CKD, retinopathy, and neuropathy. These are all discussed in detail with him.  - I discussed all available options of managing his diabetes including de-escalation of medications. I have counseled him on Food as Medicine by adopting a Whole Food , Plant Predominant  ( WFPP) nutrition as recommended by [employer] of Lifestyle Medicine. Patient is encouraged to switch to  unprocessed or minimally processed  complex starch, adequate protein intake (mainly plant source), minimal liquid fat, plenty of fruits, and vegetables. -  he is advised to stick to a routine mealtimes to eat 3 complete meals a day and snack only when necessary ( to snack only to correct hypoglycemia BG <70 day time or <100 at night).   - he acknowledges that there is a room for improvement in his food and drink choices. - Further Specific Suggestion is made for him to avoid simple carbohydrates  from his diet including Cakes, Sweet Desserts, Ice Cream, Soda (diet and regular), Sweet Tea, Candies, Chips, Cookies, Store Bought Juices, Alcohol ,  Artificial Sweeteners,  Coffee Creamer,  and "Sugar-free" Products. This will help patient to have more stable blood glucose profile and potentially avoid unintended weight gain.  - he will be scheduled with Norm Salt, RDN, CDE for individualized diabetes education.  - I have approached him with the following individualized plan to manage  his diabetes and patient agrees:   -In light of his history of pancreatitis which likely led to his diabetes, he has limited options to treat his diabetes. - In light of his presentation with near target glycemic profile, advised to lower Lantus to 20 units nightly, advised to continue to use his CGM continuously.    -He will not need prandial insulin for now.  - he is encouraged to call clinic for blood glucose levels less than 70 or above 200 mg /dl. -He is advised to continue metformin 500 mg p.o. twice daily.  He is not getting enough coverage for Jardiance, reports that he cannot afford it.   -I discussed and added glipizide 5 mg XL p.o. daily at breakfast.  e is not a suitable candidate for GLP-1 receptor agonist due to his problematic history of pancreatitis.  - Specific targets for  A1c;  LDL, HDL,  and Triglycerides were discussed with the patient.  2) Blood Pressure /Hypertension:  His blood pressure is controlled to near target.  he is advised to continue his current medications including Lisinopril 20 mg p.o. daily with breakfast . 3) Lipids/Hyperlipidemia:   Review of his recent lipid panel showed controlled  LDL at  73.  he  is advised to continue Crestor 40 mg p.o. daily at bedtime. Side effects and precautions discussed with him.  4)  Weight/Diet:  Body mass index is 29.74 kg/m.  -     he is a candidate for modest weight loss. I discussed with him the fact that loss of 5 - 10% of his  current body weight will have the most impact on his diabetes management.  The above detailed  ACLM recommendations for nutrition, exercise, sleep, social life, avoidance of risky substances,  the need for restorative sleep   information will also detailed on discharge instructions. Patient is on Creon due to previous pancreatitis.  5) Chronic Care/Health Maintenance:  -he  is on ACEI/ARB and Statin medications and  is encouraged to initiate and continue to follow up with Ophthalmology, Dentist,  Podiatrist at least yearly or according to recommendations, and advised to quit smoking. I have recommended yearly flu vaccine and pneumonia vaccine at least every 5 years; moderate intensity exercise for up to 150 minutes weekly; and  sleep for 7- 9 hours a day.  The patient was counseled on the dangers of tobacco use, and was advised to quit.  Reviewed strategies to maximize success, including removing cigarettes and smoking materials from environment.   - he is  advised to maintain close follow up with Del Nigel Berthold, FNP for primary care needs, as well as his other providers for optimal and coordinated care.   I spent  26  minutes in the care of the patient today including review of labs from CMP, Lipids, Thyroid Function, Hematology (current and previous including abstractions from other facilities); face-to-face time discussing  his blood glucose readings/logs, discussing hypoglycemia and hyperglycemia episodes and symptoms, medications doses, his options of short and long term treatment based on the latest standards of care / guidelines;  discussion about incorporating lifestyle medicine;  and documenting the encounter. Risk reduction counseling performed per USPSTF guidelines to reduce  obesity and cardiovascular risk factors.     Please refer to Patient Instructions for Blood Glucose Monitoring and Insulin/Medications Dosing Guide"  in media tab for additional information. Please  also refer to " Patient Self Inventory" in the Media  tab for reviewed elements of pertinent patient history.  Clayton Peterson participated in the discussions, expressed understanding, and voiced  agreement with the above plans.  All questions were answered to his satisfaction. he is encouraged to contact clinic should he have any questions or concerns prior to his return visit.    Follow up plan: - Return in about 4 months (around 08/19/2023) for Bring Meter/CGM Device/Logs- A1c in Office.  Clayton Lunch, MD [facility] [address] [place], [place] [address] Phone: [phone]  Fax: [phone]    04/19/2023, 2:23 PM  This note was partially dictated with voice recognition software. Similar sounding words can be transcribed inadequately or may not  be corrected upon review.

## 2023-04-19 NOTE — Patient Instructions (Signed)

## 2023-05-08 ENCOUNTER — Other Ambulatory Visit: Payer: Self-pay | Admitting: Family Medicine

## 2023-05-08 ENCOUNTER — Telehealth: Payer: Self-pay | Admitting: Family Medicine

## 2023-05-08 NOTE — Telephone Encounter (Signed)
error 

## 2023-05-09 ENCOUNTER — Encounter: Payer: Self-pay | Admitting: Internal Medicine

## 2023-05-09 ENCOUNTER — Ambulatory Visit (INDEPENDENT_AMBULATORY_CARE_PROVIDER_SITE_OTHER): Payer: PPO | Admitting: Internal Medicine

## 2023-05-09 ENCOUNTER — Telehealth: Payer: Self-pay | Admitting: Family Medicine

## 2023-05-09 ENCOUNTER — Other Ambulatory Visit: Payer: Self-pay

## 2023-05-09 ENCOUNTER — Encounter: Payer: Self-pay | Admitting: *Deleted

## 2023-05-09 VITALS — BP 189/91 | HR 62 | Temp 98.6°F | Ht 69.0 in | Wt 206.5 lb

## 2023-05-09 DIAGNOSIS — R1013 Epigastric pain: Secondary | ICD-10-CM

## 2023-05-09 DIAGNOSIS — I10 Essential (primary) hypertension: Secondary | ICD-10-CM

## 2023-05-09 DIAGNOSIS — K863 Pseudocyst of pancreas: Secondary | ICD-10-CM

## 2023-05-09 DIAGNOSIS — Z8719 Personal history of other diseases of the digestive system: Secondary | ICD-10-CM | POA: Diagnosis not present

## 2023-05-09 MED ORDER — METOPROLOL TARTRATE 50 MG PO TABS
50.0000 mg | ORAL_TABLET | Freq: Two times a day (BID) | ORAL | 3 refills | Status: DC
Start: 2023-05-09 — End: 2023-12-14

## 2023-05-09 NOTE — Telephone Encounter (Signed)
 Refill sent.

## 2023-05-09 NOTE — Patient Instructions (Signed)
I am happy to hear that you are better.  Will plan on repeat CT scan of your abdomen next month.  We will call with results.  I will also forward them to Dr. Meridee Score  Continue on pantoprazole twice daily.  Follow-up in 3 to 4 months.  It was very nice seeing you again today.  Dr. Marletta Lor

## 2023-05-09 NOTE — Progress Notes (Signed)
Referring Provider: Wylene Men* Primary Care Physician:  Rica Records, FNP Primary GI:  Dr. Marletta Lor  Chief Complaint  Patient presents with   acute pancreatitis    Follow up on acute pancreatitis.    Gastroesophageal Reflux    Follow up gerd. Doing better since increasing Protonix to twice a day.    Constipation    Follow up on constipation. Takes miralax as needed.     HPI:   Clayton Peterson is a 62 y.o. male who presents to clinic today for follow-up visit. 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   Etiology of pancreatitis unclear, cannot rule out secondary to hypertriglyceridemia with levels at 709.  He was on omeprazole chronically at that time which is a class II medication with potential to cause pancreatitis, was switched to pantoprazole given this possibility.  Follow-up CT abdomen pelvis 12/12/2022: Pancreatic tail cystic lesion (pseudocyst) still present 1/2 of the size it was in 04/2022 Stable chronic splenic vein thrombosis. Stable since 04/2022 CT. Fatty liver. Left kidney stones. Previous AAA repair. 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  Admitted to [facility] 02/06/2023 for worsening abdominal pain.  EGD 02/09/2023 with large gastric purpleish nodule/extrinsic compression likely due to pseudocyst.  EUS 02/11/2023 noted large cyst measuring approximately 8 x 7 cm.  EUS 02/13/2023 noted large pancreatic pseudocyst with walled off necrosis.  Cystogastrostomy performed with Axios stent placement.  Necrosectomy partially performed.  CT abdomen with contrast 03/14/2023 showed no residual measurable pancreatic pseudocysts and decreased peripancreatic inflammation.  Did note chronic splenic vein occlusion.  EGD 04/02/23 with Axios stent removal.   Recommended repeat CT abdomen in 2 to 3 months.  Colonoscopy 06/03/2021: Nonbleeding internal hemorrhoids, 6 mm polyp in sigmoid colon, 2 mm polyp in the ascending colon, congested and erythematous mucosa in the cecum biopsied.  Pathology revealed 2 tubular adenomas, cecal biopsy with polypoid colonic mucosa with ectatic vascular channels with small vascular malformation not ruled out, negative for dysplasia or malignancy.  Repeat in 5 years.  Today, states he is doing great.  No abdominal pain.  Reflux well-controlled.  Past Medical History:  Diagnosis Date   AAA (abdominal aortic aneurysm) (HCC)    Coronary artery disease    Diabetes mellitus without complication (HCC)    Heart attack (HCC)    High cholesterol    Hypertension     Past Surgical History:  Procedure Laterality Date   ABDOMINAL AORTIC ANEURYSM REPAIR     BACK SURGERY     x2   BIOPSY  06/03/2021   Procedure: BIOPSY;  Surgeon: Lanelle Bal, DO;  Location: [facility];  Service: Endoscopy;;   BIOPSY  02/09/2023   Procedure: BIOPSY;  Surgeon: Benancio Deeds, MD;  Location: [facility] ENDOSCOPY;  Service: Gastroenterology;;   CARDIAC CATHETERIZATION     stent placed on right side ofo heart   COLONOSCOPY WITH PROPOFOL N/A 06/03/2021   Surgeon: Lanelle Bal, DO; Nonbleeding internal hemorrhoids, 6 mm polyp in sigmoid colon, 2 mm polyp in the ascending colon, congested and erythematous mucosa in the cecum biopsied.  Pathology revealed 2 tubular adenomas, cecal biopsy with polypoid colonic mucosa with ectatic vascular channels with small vascular malformation not ruled out. Repeat in 5 years.   CYST ENTEROSTOMY N/A 02/13/2023   Procedure:  CYST GASTROSTOMY;  Surgeon: Meridee Score Netty Starring., MD;  Location: [facility] ENDOSCOPY;  Service: Gastroenterology;  Laterality: N/A;   CYST ENTEROSTOMY  04/02/2023   Procedure: CYST NECROSECTOMY;  Surgeon: Meridee Score Netty Starring., MD;  Location: [facility] ENDOSCOPY;  Service: Gastroenterology;;    ESOPHAGOGASTRODUODENOSCOPY N/A 02/11/2023   Procedure: ESOPHAGOGASTRODUODENOSCOPY (EGD);  Surgeon: Jeani Hawking, MD;  Location: [facility] ENDOSCOPY;  Service: Gastroenterology;  Laterality: N/A;   ESOPHAGOGASTRODUODENOSCOPY (EGD) WITH PROPOFOL N/A 06/03/2021   Surgeon: Lanelle Bal, DO; Gastritis biopsied, nonobstructing, nonbleeding duodenal ulcer with no stigmata of bleeding, NSAID induced etiology.  Pathology with chronic gastritis, negative for H. pylori.   ESOPHAGOGASTRODUODENOSCOPY (EGD) WITH PROPOFOL N/A 02/09/2023   Procedure: ESOPHAGOGASTRODUODENOSCOPY (EGD) WITH PROPOFOL;  Surgeon: Benancio Deeds, MD;  Location: [facility] ENDOSCOPY;  Service: Gastroenterology;  Laterality: N/A;   ESOPHAGOGASTRODUODENOSCOPY (EGD) WITH PROPOFOL N/A 02/13/2023   Procedure: ESOPHAGOGASTRODUODENOSCOPY (EGD) WITH PROPOFOL;  Surgeon: Meridee Score Netty Starring., MD;  Location: [facility] ENDOSCOPY;  Service: Gastroenterology;  Laterality: N/A;   ESOPHAGOGASTRODUODENOSCOPY (EGD) WITH PROPOFOL N/A 02/22/2023   Procedure: ESOPHAGOGASTRODUODENOSCOPY (EGD) WITH PROPOFOL;  Surgeon: Meridee Score Netty Starring., MD;  Location: [facility] ENDOSCOPY;  Service: Gastroenterology;  Laterality: N/A;   ESOPHAGOGASTRODUODENOSCOPY (EGD) WITH PROPOFOL N/A 04/02/2023   Procedure: ESOPHAGOGASTRODUODENOSCOPY (EGD) WITH PROPOFOL;  Surgeon: Meridee Score Netty Starring., MD;  Location: [facility] ENDOSCOPY;  Service: Gastroenterology;  Laterality: N/A;   EUS Left 02/11/2023   Procedure: UPPER ENDOSCOPIC ULTRASOUND (EUS) LINEAR;  Surgeon: Jeani Hawking, MD;  Location: [facility] ENDOSCOPY;  Service: Gastroenterology;  Laterality: Left;   EUS N/A 02/13/2023   Procedure: UPPER ENDOSCOPIC ULTRASOUND (EUS) LINEAR;  Surgeon: Lemar Lofty., MD;  Location: [facility] ENDOSCOPY;  Service: Gastroenterology;  Laterality: N/A;   KNEE SURGERY Left    LEFT HEART CATH AND CORONARY ANGIOGRAPHY N/A 02/07/2023   Procedure: LEFT HEART CATH AND CORONARY ANGIOGRAPHY;  Surgeon: Kathleene Hazel, MD;   Location: [facility] INVASIVE CV LAB;  Service: Cardiovascular;  Laterality: N/A;   PANCREATIC STENT PLACEMENT  02/13/2023   Procedure: PANCREATIC STENT PLACEMENT;  Surgeon: Meridee Score Netty Starring., MD;  Location: [facility] ENDOSCOPY;  Service: Gastroenterology;;   PANCREATIC STENT PLACEMENT  02/22/2023   Procedure: PANCREATIC STENT PLACEMENT;  Surgeon: Lemar Lofty., MD;  Location: [facility] ENDOSCOPY;  Service: Gastroenterology;;   PARTIAL COLECTOMY     POLYPECTOMY  06/03/2021   Procedure: POLYPECTOMY INTESTINAL;  Surgeon: Lanelle Bal, DO;  Location: [facility];  Service: Endoscopy;;   STENT REMOVAL  02/22/2023   Procedure: STENT REMOVAL;  Surgeon: Lemar Lofty., MD;  Location: [facility] ENDOSCOPY;  Service: Gastroenterology;;   Francine Graven REMOVAL  04/02/2023   Procedure: STENT REMOVAL;  Surgeon: Lemar Lofty., MD;  Location: [facility] ENDOSCOPY;  Service: Gastroenterology;;    Current Outpatient Medications  Medication Sig Dispense Refill   acetaminophen (TYLENOL) 650 MG CR tablet Take 1,300 mg by mouth every 8 (eight) hours as needed for pain.     aspirin 81 MG EC tablet Take 81 mg by mouth daily.     Cholecalciferol (VITAMIN D3) 125 MCG (5000 UT) TABS Take 1 tablet by mouth daily.     Continuous Glucose Receiver (FREESTYLE LIBRE 3 READER) DEVI 1 each by Does not apply route daily. 1 each 0   Continuous Glucose Sensor (FREESTYLE LIBRE 3 SENSOR) MISC Place 1 sensor on the skin every 14 days. Use to check glucose continuously 2 each 6   fenofibrate micronized (LOFIBRA) 200 MG capsule Take 200 mg by mouth daily before breakfast.     gabapentin (NEURONTIN) 100 MG  capsule Take 1 capsule by mouth once daily 30 capsule 0   glipiZIDE (GLUCOTROL XL) 5 MG 24 hr tablet Take 1 tablet (5 mg total) by mouth daily with breakfast. 90 tablet 1   insulin glargine (LANTUS) 100 UNIT/ML injection Inject 20 Units into the skin at bedtime.     metFORMIN (GLUCOPHAGE) 1000 MG tablet Take 500 mg by mouth 2 (two) times  daily with a meal.     metoprolol tartrate (LOPRESSOR) 50 MG tablet Take 1 tablet (50 mg total) by mouth 2 (two) times daily. 60 tablet 3   nitroGLYCERIN (NITROSTAT) 0.4 MG SL tablet Place 1 tablet (0.4 mg total) under the tongue every 5 (five) minutes as needed for chest pain. 50 tablet 3   Omega-3 Fatty Acids (OMEGA 3 PO) Take 2,000 mg by mouth 2 (two) times daily.     ONETOUCH ULTRA test strip USE 1 STRIP TO CHECK GLUCOSE THREE TIMES DAILY     pantoprazole (PROTONIX) 40 MG tablet Take 1 tablet (40 mg total) by mouth 2 (two) times daily. 60 tablet 3   RELION PEN NEEDLE 31G/8MM 31G X 8 MM MISC      rosuvastatin (CRESTOR) 40 MG tablet Take 1 tablet (40 mg total) by mouth at bedtime. 90 tablet 1   No current facility-administered medications for this visit.    Allergies as of 05/09/2023 - Review Complete 05/09/2023  Allergen Reaction Noted   Bee venom Anaphylaxis 05/22/2021   Coconut (cocos nucifera) Anaphylaxis 05/22/2021    Family History  Problem Relation Age of Onset   Atrial fibrillation Mother    Alzheimer's disease Mother    Diabetes Brother    Alzheimer's disease Maternal Grandmother    Aneurysm Maternal Grandfather    Pancreatitis Neg Hx    Pancreatic cancer Neg Hx    Colon cancer Neg Hx     Social History   Socioeconomic History   Marital status: Married    Spouse name: Not on file   Number of children: Not on file   Years of education: Not on file   Highest education level: Associate degree: occupational, Scientist, product/process development, or vocational program  Occupational History   Occupation: disabled, former truck driver  Tobacco Use   Smoking status: Every Day    Current packs/day: 1.50    Average packs/day: 1.5 packs/day for 40.0 years (60.0 ttl pk-yrs)    Types: Cigarettes    Passive exposure: Current   Smokeless tobacco: Never   Tobacco comments:    Smokes 0.5 PPD--03/15/2023.  Vaping Use   Vaping status: Never Used  Substance and Sexual Activity   Alcohol use: Yes     Comment: very seldom   Drug use: Never   Sexual activity: Yes  Other Topics Concern   Not on file  Social History Narrative   Not on file   Social Determinants of Health   Financial Resource Strain: Low Risk  (04/18/2023)   Overall Financial Resource Strain (CARDIA)    Difficulty of Paying Living Expenses: Not hard at all  Food Insecurity: No Food Insecurity (04/18/2023)   Hunger Vital Sign    Worried About Running Out of Food in the Last Year: Never true    Ran Out of Food in the Last Year: Never true  Transportation Needs: No Transportation Needs (04/18/2023)   PRAPARE - Administrator, Civil Service (Medical): No    Lack of Transportation (Non-Medical): No  Physical Activity: Insufficiently Active (04/18/2023)   Exercise Vital Sign  Days of Exercise per Week: 7 days    Minutes of Exercise per Session: 20 min  Stress: No Stress Concern Present (04/18/2023)   [employer] of Occupational Health - Occupational Stress Questionnaire    Feeling of Stress : Not at all  Social Connections: Moderately Isolated (04/18/2023)   Social Connection and Isolation Panel [NHANES]    Frequency of Communication with Friends and Family: More than three times a week    Frequency of Social Gatherings with Friends and Family: More than three times a week    Attends Religious Services: Never    Database administrator or Organizations: No    Attends Banker Meetings: Never    Marital Status: Married    Subjective: Review of Systems  Constitutional:  Negative for chills and fever.  HENT:  Negative for congestion and hearing loss.   Eyes:  Negative for blurred vision and double vision.  Respiratory:  Negative for cough and shortness of breath.   Cardiovascular:  Negative for chest pain and palpitations.  Gastrointestinal:  Negative for abdominal pain, blood in stool, constipation, diarrhea, heartburn, melena and vomiting.  Genitourinary:  Negative for dysuria and  urgency.  Musculoskeletal:  Negative for joint pain and myalgias.  Skin:  Negative for itching and rash.  Neurological:  Negative for dizziness and headaches.  Psychiatric/Behavioral:  Negative for depression. The patient is not nervous/anxious.      Objective: BP (!) 181/84 (BP Location: Left Arm, Patient Position: Sitting, Cuff Size: Normal)   Pulse (!) 56   Temp 98.6 F (37 C) (Oral)   Ht 5\' 9"  (1.753 m)   Wt 206 lb 8 oz (93.7 kg)   BMI 30.49 kg/m  Physical Exam Constitutional:      Appearance: Normal appearance.  HENT:     Head: Normocephalic and atraumatic.  Eyes:     Extraocular Movements: Extraocular movements intact.     Conjunctiva/sclera: Conjunctivae normal.  Cardiovascular:     Rate and Rhythm: Normal rate and regular rhythm.  Pulmonary:     Effort: Pulmonary effort is normal.     Breath sounds: Normal breath sounds.  Abdominal:     General: Bowel sounds are normal.     Palpations: Abdomen is soft.  Musculoskeletal:        General: Normal range of motion.     Cervical back: Normal range of motion and neck supple.  Skin:    General: Skin is warm.  Neurological:     General: No focal deficit present.     Mental Status: He is alert and oriented to person, place, and time.  Psychiatric:        Mood and Affect: Mood normal.        Behavior: Behavior normal.      Assessment: *Pancreatitis complicated by pseudocyst formation s/p necrosectomy, Axios stent placement *Epigastric pain-resolved *Peptic ulcer disease *Chronic GERD  Plan: Most recent CT imaging showed resolution of pancreatic pseudocyst.  We will plan on repeat CT abdomen with IV contrast in 1 to 2 months.  I will forward these results to his advanced endoscopist Dr. Meridee Score.   Continue on pantoprazole for reflux and history of peptic ulcer disease.  Recommended he avoid NSAIDs.  Follow-up in 3 to 4 months.   05/09/2023 3:05 PM   Disclaimer: This note was dictated with voice  recognition software. Similar sounding words can inadvertently be transcribed and may not be corrected upon review.

## 2023-05-09 NOTE — Telephone Encounter (Signed)
Prescription Request  05/09/2023  LOV: 04/02/2023  What is the name of the medication or equipment? metoprolol tartrate (LOPRESSOR) 50 MG tablet [[id number]]   Have you contacted your pharmacy to request a refill? Yes   Which pharmacy would you like this sent to?  [facility] [address] [place] - [address] [place] [address] Phone: [phone] Fax: [phone]    Patient notified that their request is being sent to the clinical staff for review and that they should receive a response within 2 business days.   Please advise at Mobile [phone] (mobile)\

## 2023-05-09 NOTE — Addendum Note (Signed)
Addended by: Armstead Peaks on: 05/09/2023 03:37 PM   Modules accepted: Orders

## 2023-05-10 ENCOUNTER — Other Ambulatory Visit: Payer: Self-pay | Admitting: Internal Medicine

## 2023-05-10 NOTE — Telephone Encounter (Signed)
Noted   declined

## 2023-05-10 NOTE — Telephone Encounter (Signed)
Messaged the pt and asked who is also prescribing Pantoprazole for him because he just had a RF with 3 refills sent in. Waiting on a response.

## 2023-06-05 ENCOUNTER — Other Ambulatory Visit: Payer: Self-pay | Admitting: Family Medicine

## 2023-06-13 ENCOUNTER — Encounter: Payer: Self-pay | Admitting: *Deleted

## 2023-06-18 ENCOUNTER — Encounter (HOSPITAL_COMMUNITY): Payer: Self-pay

## 2023-06-18 ENCOUNTER — Ambulatory Visit (HOSPITAL_COMMUNITY)
Admission: RE | Admit: 2023-06-18 | Discharge: 2023-06-18 | Disposition: A | Discharge status: Home / Self Care | Payer: PPO | Source: Ambulatory Visit | Attending: Internal Medicine | Admitting: Internal Medicine

## 2023-06-18 DIAGNOSIS — K863 Pseudocyst of pancreas: Secondary | ICD-10-CM

## 2023-06-18 DIAGNOSIS — Z8719 Personal history of other diseases of the digestive system: Secondary | ICD-10-CM

## 2023-06-20 ENCOUNTER — Encounter (HOSPITAL_COMMUNITY): Payer: Self-pay

## 2023-06-20 ENCOUNTER — Ambulatory Visit (HOSPITAL_COMMUNITY)
Admission: RE | Admit: 2023-06-20 | Discharge: 2023-06-20 | Disposition: A | Discharge status: Home / Self Care | Payer: PPO | Source: Ambulatory Visit | Attending: Internal Medicine | Admitting: Internal Medicine

## 2023-06-20 DIAGNOSIS — Z8719 Personal history of other diseases of the digestive system: Secondary | ICD-10-CM | POA: Diagnosis present

## 2023-06-20 DIAGNOSIS — K863 Pseudocyst of pancreas: Secondary | ICD-10-CM | POA: Diagnosis present

## 2023-06-20 LAB — POCT I-STAT CREATININE: Creatinine, Ser: 1.8 mg/dL — ABNORMAL HIGH (ref 0.61–1.24)

## 2023-06-20 MED ORDER — IOHEXOL 300 MG/ML  SOLN
80.0000 mL | Freq: Once | INTRAMUSCULAR | Status: AC | PRN
  Administered 2023-06-20: 80 mL via INTRAVENOUS

## 2023-06-21 ENCOUNTER — Ambulatory Visit (INDEPENDENT_AMBULATORY_CARE_PROVIDER_SITE_OTHER): Payer: PPO | Admitting: Family Medicine

## 2023-06-21 ENCOUNTER — Telehealth: Payer: Self-pay | Admitting: Family Medicine

## 2023-06-21 ENCOUNTER — Encounter: Payer: Self-pay | Admitting: Family Medicine

## 2023-06-21 VITALS — BP 158/89 | HR 48 | Ht 69.0 in | Wt 208.0 lb

## 2023-06-21 DIAGNOSIS — Z23 Encounter for immunization: Secondary | ICD-10-CM

## 2023-06-21 DIAGNOSIS — Z125 Encounter for screening for malignant neoplasm of prostate: Secondary | ICD-10-CM | POA: Diagnosis not present

## 2023-06-21 DIAGNOSIS — I10 Essential (primary) hypertension: Secondary | ICD-10-CM | POA: Diagnosis not present

## 2023-06-21 DIAGNOSIS — E785 Hyperlipidemia, unspecified: Secondary | ICD-10-CM | POA: Diagnosis not present

## 2023-06-21 MED ORDER — INSULIN GLARGINE 100 UNIT/ML ~~LOC~~ SOLN: 20.0000 [IU] | Freq: Every day | SUBCUTANEOUS | 1 refills | Status: DC

## 2023-06-21 MED ORDER — AMLODIPINE BESYLATE 2.5 MG PO TABS: 2.5000 mg | ORAL_TABLET | Freq: Every day | ORAL | 3 refills | Status: DC

## 2023-06-21 NOTE — Telephone Encounter (Signed)
Patient spouse called said the wrong prescription was filled should have sent in insulin injection, per spouse almost like a epic pen it is lantus solsar. Call patient spouse back with questions [phone]. he wants the pens that he has to put on himself  Pharmacy: [facility]

## 2023-06-21 NOTE — Patient Instructions (Signed)

## 2023-06-21 NOTE — Assessment & Plan Note (Addendum)
Vitals:   06/21/23 0826 06/21/23 0845  BP: (!) 155/74 (!) 158/89   Blood pressure not controlled in today's visit, Labs ordered. Patient reports taking Metoprolol 50 mg 2 times daily Trial on amlodipine 2.5 mg Follow up in 1 week with at home blood pressure readings Continued discussion on DASH diet, low sodium diet and maintain a exercise routine for 150 minutes per week.

## 2023-06-21 NOTE — Progress Notes (Signed)
Patient Office Visit   Subjective   Patient ID: Clayton Peterson, male    DOB: 10/07/1961  Age: 62 y.o. MRN: [id number]  CC:  Chief Complaint  Patient presents with   Hypertension    F/u   Medication Refill    Rosuvastatin, Lantus   Immunizations    Pt. Request Flu shot, Tdap, and foot exam.  declines Singles. Pt. Has had an eye exam, diabetic evaluation, and annual wellness.     HPI Clayton Peterson 62 year old male, presents to the clinic for chronic follow up. He  has a past medical history of AAA (abdominal aortic aneurysm) (HCC), Coronary artery disease, Diabetes mellitus without complication (HCC), Heart attack (HCC), High cholesterol, and Hypertension.For the details of today's visit, please refer to assessment and plan.   HPI    Outpatient Encounter Medications as of 06/21/2023  Medication Sig   acetaminophen (TYLENOL) 650 MG CR tablet Take 1,300 mg by mouth every 8 (eight) hours as needed for pain.   amLODipine (NORVASC) 2.5 MG tablet Take 1 tablet (2.5 mg total) by mouth daily.   aspirin 81 MG EC tablet Take 81 mg by mouth daily.   Cholecalciferol (VITAMIN D3) 125 MCG (5000 UT) TABS Take 1 tablet by mouth daily.   Continuous Glucose Receiver (FREESTYLE LIBRE 3 READER) DEVI 1 each by Does not apply route daily.   Continuous Glucose Sensor (FREESTYLE LIBRE 3 SENSOR) MISC Place 1 sensor on the skin every 14 days. Use to check glucose continuously   fenofibrate micronized (LOFIBRA) 200 MG capsule Take 200 mg by mouth daily before breakfast.   gabapentin (NEURONTIN) 100 MG capsule Take 1 capsule by mouth once daily   glipiZIDE (GLUCOTROL XL) 5 MG 24 hr tablet Take 1 tablet (5 mg total) by mouth daily with breakfast.   metFORMIN (GLUCOPHAGE) 1000 MG tablet Take 500 mg by mouth 2 (two) times daily with a meal.   metoprolol tartrate (LOPRESSOR) 50 MG tablet Take 1 tablet (50 mg total) by mouth 2 (two) times daily.   nitroGLYCERIN (NITROSTAT) 0.4 MG SL tablet Place 1 tablet  (0.4 mg total) under the tongue every 5 (five) minutes as needed for chest pain.   Omega-3 Fatty Acids (OMEGA 3 PO) Take 2,000 mg by mouth 2 (two) times daily.   ONETOUCH ULTRA test strip USE 1 STRIP TO CHECK GLUCOSE THREE TIMES DAILY   pantoprazole (PROTONIX) 40 MG tablet Take 1 tablet (40 mg total) by mouth 2 (two) times daily.   RELION PEN NEEDLE 31G/8MM 31G X 8 MM MISC    rosuvastatin (CRESTOR) 40 MG tablet Take 1 tablet (40 mg total) by mouth at bedtime.   [DISCONTINUED] insulin glargine (LANTUS) 100 UNIT/ML injection Inject 20 Units into the skin at bedtime.   insulin glargine (LANTUS) 100 UNIT/ML injection Inject 0.2 mLs (20 Units total) into the skin at bedtime.   No facility-administered encounter medications on file as of 06/21/2023.    Past Surgical History:  Procedure Laterality Date   ABDOMINAL AORTIC ANEURYSM REPAIR     BACK SURGERY     x2   BIOPSY  06/03/2021   Procedure: BIOPSY;  Surgeon: Lanelle Bal, DO;  [facility];  Service: Endoscopy;;   BIOPSY  02/09/2023   Procedure: BIOPSY;  Surgeon: Benancio Deeds, MD;  Location: [facility] ENDOS[facility];  Service: Gastroenterology;;   CARDIAC CATHETERIZATION     stent placed on right side ofo heart   COLONOSCOPY WITH PROPOFOL N/A 06/03/2021  Surgeon: Earnest Bailey K, DO; Nonbleeding internal hemorrhoids, 6 mm polyp in sigmoid colon, 2 mm polyp in the ascending colon, congested and erythematous mucosa in the cecum biopsied.  Pathology revealed 2 tubular adenomas, cecal biopsy with polypoid colonic mucosa with ectatic vascular channels with small vascular malformation not ruled out. Repeat in 5 years.   CYST ENTEROSTOMY N/A 02/13/2023   Procedure: CYST GASTROSTOMY;  Surgeon: Meridee Score Netty Starring., MD;  Location: [facility] ENDOSCOPY;  Service: Gastroenterology;  Laterality: N/A;   CYST ENTEROSTOMY  04/02/2023   Procedure: CYST NECROSECTOMY;  Surgeon: Meridee Score Netty Starring., MD;  Location: [facility] ENDOSCOPY;  Service:  Gastroenterology;;   ESOPHAGOGASTRODUODENOSCOPY N/A 02/11/2023   Procedure: ESOPHAGOGASTRODUODENOSCOPY (EGD);  Surgeon: Jeani Hawking, MD;  Location: [facility] ENDOSCOPY;  Service: Gastroenterology;  Laterality: N/A;   ESOPHAGOGASTRODUODENOSCOPY (EGD) WITH PROPOFOL N/A 06/03/2021   Surgeon: Lanelle Bal, DO; Gastritis biopsied, nonobstructing, nonbleeding duodenal ulcer with no stigmata of bleeding, NSAID induced etiology.  Pathology with chronic gastritis, negative for H. pylori.   ESOPHAGOGASTRODUODENOSCOPY (EGD) WITH PROPOFOL N/A 02/09/2023   Procedure: ESOPHAGOGASTRODUODENOSCOPY (EGD) WITH PROPOFOL;  Surgeon: Benancio Deeds, MD;  Location: [facility] ENDOSCOPY;  Service: Gastroenterology;  Laterality: N/A;   ESOPHAGOGASTRODUODENOSCOPY (EGD) WITH PROPOFOL N/A 02/13/2023   Procedure: ESOPHAGOGASTRODUODENOSCOPY (EGD) WITH PROPOFOL;  Surgeon: Meridee Score Netty Starring., MD;  Location: [facility] ENDOSCOPY;  Service: Gastroenterology;  Laterality: N/A;   ESOPHAGOGASTRODUODENOSCOPY (EGD) WITH PROPOFOL N/A 02/22/2023   Procedure: ESOPHAGOGASTRODUODENOSCOPY (EGD) WITH PROPOFOL;  Surgeon: Meridee Score Netty Starring., MD;  Location: [facility] ENDOSCOPY;  Service: Gastroenterology;  Laterality: N/A;   ESOPHAGOGASTRODUODENOSCOPY (EGD) WITH PROPOFOL N/A 04/02/2023   Procedure: ESOPHAGOGASTRODUODENOSCOPY (EGD) WITH PROPOFOL;  Surgeon: Meridee Score Netty Starring., MD;  Location: [facility] ENDOSCOPY;  Service: Gastroenterology;  Laterality: N/A;   EUS Left 02/11/2023   Procedure: UPPER ENDOSCOPIC ULTRASOUND (EUS) LINEAR;  Surgeon: Jeani Hawking, MD;  Location: [facility] ENDOSCOPY;  Service: Gastroenterology;  Laterality: Left;   EUS N/A 02/13/2023   Procedure: UPPER ENDOSCOPIC ULTRASOUND (EUS) LINEAR;  Surgeon: Lemar Lofty., MD;  Location: [facility] ENDOSCOPY;  Service: Gastroenterology;  Laterality: N/A;   KNEE SURGERY Left    LEFT HEART CATH AND CORONARY ANGIOGRAPHY N/A 02/07/2023   Procedure: LEFT HEART CATH AND CORONARY ANGIOGRAPHY;  Surgeon: Kathleene Hazel, MD;  Location: [facility] INVASIVE CV LAB;  Service: Cardiovascular;  Laterality: N/A;   PANCREATIC STENT PLACEMENT  02/13/2023   Procedure: PANCREATIC STENT PLACEMENT;  Surgeon: Meridee Score Netty Starring., MD;  Location: [facility] ENDOSCOPY;  Service: Gastroenterology;;   PANCREATIC STENT PLACEMENT  02/22/2023   Procedure: PANCREATIC STENT PLACEMENT;  Surgeon: Lemar Lofty., MD;  Location: [facility] ENDOSCOPY;  Service: Gastroenterology;;   PARTIAL COLECTOMY     POLYPECTOMY  06/03/2021   Procedure: POLYPECTOMY INTESTINAL;  Surgeon: Lanelle Bal, DO;  [facility];  Service: Endoscopy;;   STENT REMOVAL  02/22/2023   Procedure: STENT REMOVAL;  Surgeon: Lemar Lofty., MD;  Location: [facility] ENDOSCOPY;  Service: Gastroenterology;;   Francine Graven REMOVAL  04/02/2023   Procedure: STENT REMOVAL;  Surgeon: Lemar Lofty., MD;  Location: [facility] ENDOSCOPY;  Service: Gastroenterology;;    Review of Systems  Constitutional:  Negative for chills and fever.  Eyes:  Negative for blurred vision.  Respiratory:  Negative for shortness of breath.   Cardiovascular:  Negative for chest pain.  Genitourinary:  Negative for dysuria.  Neurological:  Positive for headaches.      Objective    BP (!) 158/89   Pulse (!) 48   Ht 5\' 9"  (1.753 m)  Wt 208 lb (94.3 kg)   SpO2 94%   BMI 30.72 kg/m   Physical Exam Vitals reviewed.  Constitutional:      General: He is not in acute distress.    Appearance: Normal appearance. He is not ill-appearing, toxic-appearing or diaphoretic.  HENT:     Head: Normocephalic.  Eyes:     General:        Right eye: No discharge.        Left eye: No discharge.     Conjunctiva/sclera: Conjunctivae normal.  Cardiovascular:     Rate and Rhythm: Normal rate.     Pulses: Normal pulses.     Heart sounds: Normal heart sounds.  Pulmonary:     Effort: Pulmonary effort is normal. No respiratory distress.     Breath sounds: Normal breath sounds.  Abdominal:      General: Bowel sounds are normal.     Palpations: Abdomen is soft.     Tenderness: There is no abdominal tenderness. There is no guarding.  Musculoskeletal:        General: Normal range of motion.     Cervical back: Normal range of motion.  Skin:    General: Skin is warm and dry.     Capillary Refill: Capillary refill takes less than 2 seconds.  Neurological:     General: No focal deficit present.     Mental Status: He is alert and oriented to person, place, and time.     Coordination: Coordination normal.     Gait: Gait normal.  Psychiatric:        Mood and Affect: Mood normal.        Behavior: Behavior normal.       Assessment & Plan:  Primary hypertension -     CMP14+EGFR -     CBC with Differential/Platelet  Hyperlipidemia, unspecified hyperlipidemia type -     Lipid panel  Screening for prostate cancer -     PSA  Encounter for immunization -     Flu vaccine trivalent PF, 6mos and older(Flulaval,Afluria,Fluarix,Fluzone)  Essential hypertension, benign Assessment & Plan: Vitals:   06/21/23 0826 06/21/23 0845  BP: (!) 155/74 (!) 158/89   Blood pressure not controlled in today's visit, Labs ordered. Patient reports taking Metoprolol 50 mg 2 times daily Trial on amlodipine 2.5 mg Follow up in 1 week with at home blood pressure readings Continued discussion on DASH diet, low sodium diet and maintain a exercise routine for 150 minutes per week.    Other orders -     Tdap vaccine greater than or equal to 7yo IM -     Insulin Glargine; Inject 0.2 mLs (20 Units total) into the skin at bedtime.  Dispense: 10 mL; Refill: 1 -     amLODIPine Besylate; Take 1 tablet (2.5 mg total) by mouth daily.  Dispense: 30 tablet; Refill: 3    Return in about 4 months (around 10/21/2023), or if symptoms worsen or fail to improve, for hypertension, routine labs.   Cruzita Lederer Newman Nip, FNP

## 2023-06-22 ENCOUNTER — Other Ambulatory Visit: Payer: Self-pay

## 2023-06-22 LAB — PSA: Prostate Specific Ag, Serum: 0.2 ng/mL (ref 0.0–4.0)

## 2023-06-22 LAB — CMP14+EGFR
ALT: 10 IU/L (ref 0–44)
AST: 9 IU/L (ref 0–40)
Albumin: 4.4 g/dL (ref 3.9–4.9)
Alkaline Phosphatase: 63 IU/L (ref 44–121)
BUN/Creatinine Ratio: 15 (ref 10–24)
BUN: 22 mg/dL (ref 8–27)
Bilirubin Total: 0.4 mg/dL (ref 0.0–1.2)
CO2: 21 mmol/L (ref 20–29)
Calcium: 9.5 mg/dL (ref 8.6–10.2)
Chloride: 102 mmol/L (ref 96–106)
Creatinine, Ser: 1.48 mg/dL — ABNORMAL HIGH (ref 0.76–1.27)
Globulin, Total: 1.9 g/dL (ref 1.5–4.5)
Glucose: 149 mg/dL — ABNORMAL HIGH (ref 70–99)
Potassium: 4.5 mmol/L (ref 3.5–5.2)
Sodium: 137 mmol/L (ref 134–144)
Total Protein: 6.3 g/dL (ref 6.0–8.5)
eGFR: 53 mL/min/{1.73_m2} — ABNORMAL LOW (ref >59)

## 2023-06-22 LAB — LIPID PANEL
Chol/HDL Ratio: 4.8 ratio (ref 0.0–5.0)
Cholesterol, Total: 145 mg/dL (ref 100–199)
HDL: 30 mg/dL — ABNORMAL LOW (ref >39)
LDL Chol Calc (NIH): 91 mg/dL (ref 0–99)
Triglycerides: 131 mg/dL (ref 0–149)
VLDL Cholesterol Cal: 24 mg/dL (ref 5–40)

## 2023-06-22 LAB — CBC WITH DIFFERENTIAL/PLATELET
Basophils Absolute: 0.1 10*3/uL (ref 0.0–0.2)
Basos: 1 %
EOS (ABSOLUTE): 0.2 10*3/uL (ref 0.0–0.4)
Eos: 3 %
Hematocrit: 47.1 % (ref 37.5–51.0)
Hemoglobin: 14.6 g/dL (ref 13.0–17.7)
Immature Grans (Abs): 0 10*3/uL (ref 0.0–0.1)
Immature Granulocytes: 0 %
Lymphocytes Absolute: 1.3 10*3/uL (ref 0.7–3.1)
Lymphs: 17 %
MCH: 25.7 pg — ABNORMAL LOW (ref 26.6–33.0)
MCHC: 31 g/dL — ABNORMAL LOW (ref 31.5–35.7)
MCV: 83 fL (ref 79–97)
Monocytes Absolute: 0.5 10*3/uL (ref 0.1–0.9)
Monocytes: 7 %
Neutrophils Absolute: 5.6 10*3/uL (ref 1.4–7.0)
Neutrophils: 72 %
Platelets: 126 10*3/uL — ABNORMAL LOW (ref 150–450)
RBC: 5.67 x10E6/uL (ref 4.14–5.80)
RDW: 15.2 % (ref 11.6–15.4)
WBC: 7.7 10*3/uL (ref 3.4–10.8)

## 2023-06-22 MED ORDER — LANTUS SOLOSTAR 100 UNIT/ML ~~LOC~~ SOPN: 20.0000 [IU] | PEN_INJECTOR | Freq: Every day | SUBCUTANEOUS | 99 refills | Status: DC

## 2023-06-22 NOTE — Telephone Encounter (Signed)
Solostar pen called in

## 2023-07-03 ENCOUNTER — Telehealth: Payer: Self-pay | Admitting: Family Medicine

## 2023-07-03 NOTE — Telephone Encounter (Signed)
Noted  

## 2023-07-03 NOTE — Telephone Encounter (Signed)
Dropped off copy of blood pressure readings paper, put into provider box

## 2023-07-05 ENCOUNTER — Other Ambulatory Visit: Payer: Self-pay | Admitting: Family Medicine

## 2023-07-15 ENCOUNTER — Other Ambulatory Visit: Payer: Self-pay | Admitting: Family Medicine

## 2023-07-15 MED ORDER — AMLODIPINE BESYLATE 10 MG PO TABS: 10.0000 mg | ORAL_TABLET | Freq: Every day | ORAL | 3 refills | Status: DC

## 2023-07-16 NOTE — Progress Notes (Unsigned)
[facility] [place]  Patient name: Clayton Peterson MRN: [id number] DOB: 03-07-1961 Sex: male  REASON FOR CONSULT: Evaluation of recent CT scan and postop abdominal aortic aneurysm repair  HPI: Previously: Clayton Peterson is a 62 y.o. male, who is here today for evaluation.  He is here with his wife.  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  This was in [place].  His surgeon was Dr. Inocente Salles to I know well.  He has had episode of pancreatitis proximally 1 year ago and had a CT scan and recently had follow-up within the last month and he is seen today for discussion of these results.  07/17/23: To clinic for evaluation.  He was previously under the care of my partner Dr. Arbie Cookey.  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  Thankfully he is doing better from the standpoint.  We reviewed his most recent CT scan in detail.  Past Medical History:  Diagnosis Date   AAA (abdominal aortic aneurysm) (HCC)    Coronary artery disease    Diabetes mellitus without complication (HCC)    Heart attack (HCC)    High cholesterol    Hypertension     Family History  Problem Relation Age of Onset   Atrial fibrillation Mother    Alzheimer's disease Mother    Diabetes Brother    Alzheimer's disease Maternal Grandmother    Aneurysm Maternal Grandfather    Pancreatitis Neg Hx    Pancreatic cancer Neg Hx    Colon cancer Neg Hx     SOCIAL HISTORY: Social History   Socioeconomic History   Marital status: Married    Spouse name: Not on file   Number of children: Not on file   Years of education: Not on file   Highest education level: Associate degree: occupational, Scientist, product/process development, or vocational program  Occupational History   Occupation: disabled, former truck driver  Tobacco Use   Smoking status: Every Day    Current packs/day: 1.50    Average  packs/day: 1.5 packs/day for 40.0 years (60.0 ttl pk-yrs)    Types: Cigarettes    Passive exposure: Current   Smokeless tobacco: Never   Tobacco comments:    Smokes 0.5 PPD--03/15/2023.  Vaping Use   Vaping status: Never Used  Substance and Sexual Activity   Alcohol use: Yes    Comment: very seldom   Drug use: Never   Sexual activity: Yes  Other Topics Concern   Not on file  Social History Narrative   Not on file   Social Determinants of Health   Financial Resource Strain: Low Risk  (04/18/2023)   Overall Financial Resource Strain (CARDIA)    Difficulty of Paying Living Expenses: Not hard at all  Food Insecurity: No Food Insecurity (04/18/2023)   Hunger Vital Sign    Worried About Running Out of Food in the Last Year: Never true    Ran Out of Food in the Last Year: Never true  Transportation Needs: No Transportation Needs (04/18/2023)   PRAPARE - Administrator, Civil Service (Medical): No    Lack of Transportation (Non-Medical): No  Physical Activity: Insufficiently Active (04/18/2023)   Exercise Vital Sign    Days of Exercise per Week: 7 days    Minutes of Exercise per Session: 20 min  Stress: No Stress Concern Present (04/18/2023)   [employer] of Occupational Health - Occupational Stress Questionnaire  Feeling of Stress : Not at all  Social Connections: Moderately Isolated (04/18/2023)   Social Connection and Isolation Panel [NHANES]    Frequency of Communication with Friends and Family: More than three times a week    Frequency of Social Gatherings with Friends and Family: More than three times a week    Attends Religious Services: Never    Database administrator or Organizations: No    Attends Banker Meetings: Never    Marital Status: Married  Catering manager Violence: Not At Risk (04/18/2023)   Humiliation, Afraid, Rape, and Kick questionnaire    Fear of Current or Ex-Partner: No    Emotionally Abused: No    Physically Abused: No     Sexually Abused: No    Allergies  Allergen Reactions   Bee Venom Anaphylaxis   Coconut (Cocos Nucifera) Anaphylaxis    Current Outpatient Medications  Medication Sig Dispense Refill   acetaminophen (TYLENOL) 650 MG CR tablet Take 1,300 mg by mouth every 8 (eight) hours as needed for pain.     amLODipine (NORVASC) 10 MG tablet Take 1 tablet (10 mg total) by mouth daily. 30 tablet 3   aspirin 81 MG EC tablet Take 81 mg by mouth daily.     Cholecalciferol (VITAMIN D3) 125 MCG (5000 UT) TABS Take 1 tablet by mouth daily.     Continuous Glucose Receiver (FREESTYLE LIBRE 3 READER) DEVI 1 each by Does not apply route daily. 1 each 0   Continuous Glucose Sensor (FREESTYLE LIBRE 3 SENSOR) MISC Place 1 sensor on the skin every 14 days. Use to check glucose continuously 2 each 6   fenofibrate micronized (LOFIBRA) 200 MG capsule Take 200 mg by mouth daily before breakfast.     gabapentin (NEURONTIN) 100 MG capsule Take 1 capsule by mouth once daily 30 capsule 0   glipiZIDE (GLUCOTROL XL) 5 MG 24 hr tablet Take 1 tablet (5 mg total) by mouth daily with breakfast. 90 tablet 1   insulin glargine (LANTUS SOLOSTAR) 100 UNIT/ML Solostar Pen Inject 20 Units into the skin daily. 15 mL PRN   insulin glargine (LANTUS) 100 UNIT/ML injection Inject 0.2 mLs (20 Units total) into the skin at bedtime. 10 mL 1   metFORMIN (GLUCOPHAGE) 1000 MG tablet Take 500 mg by mouth 2 (two) times daily with a meal.     metoprolol tartrate (LOPRESSOR) 50 MG tablet Take 1 tablet (50 mg total) by mouth 2 (two) times daily. 60 tablet 3   nitroGLYCERIN (NITROSTAT) 0.4 MG SL tablet Place 1 tablet (0.4 mg total) under the tongue every 5 (five) minutes as needed for chest pain. 50 tablet 3   Omega-3 Fatty Acids (OMEGA 3 PO) Take 2,000 mg by mouth 2 (two) times daily.     ONETOUCH ULTRA test strip USE 1 STRIP TO CHECK GLUCOSE THREE TIMES DAILY     pantoprazole (PROTONIX) 40 MG tablet Take 1 tablet (40 mg total) by mouth 2 (two) times  daily. 60 tablet 3   RELION PEN NEEDLE 31G/8MM 31G X 8 MM MISC      rosuvastatin (CRESTOR) 40 MG tablet Take 1 tablet (40 mg total) by mouth at bedtime. 90 tablet 1   No current facility-administered medications for this visit.   PHYSICAL EXAM: Vitals:   07/17/23 1312  BP: (!) 178/79  Pulse: (!) 56  Resp: 20  Temp: 97.9 F (36.6 C)  SpO2: 96%  Weight: 206 lb (93.4 kg)  Height: 5\' 9"  (1.753 m)  GENERAL: The patient is a well-nourished male, in no acute distress. The vital signs are documented above. CARDIOVASCULAR: 2+ radial pulse.  2+ femoral pulses.  2+ popliteal pulses without evidence of aneurysm and 2+ dorsalis pedis pulses bilaterally Midline abdominal incision from xiphoid to pubis well-healed. Midline hernia PULMONARY: There is good air exchange  MUSCULOSKELETAL: There are no major deformities or cyanosis. NEUROLOGIC: No focal weakness or paresthesias are detected. SKIN: There are no ulcers or rashes noted. PSYCHIATRIC: The patient has a normal affect.  DATA:  CT scan was reviewed with the patient and his wife.  This looks overall stable to prior studies.  The pararenal segment is about 45 mm.  There is a small round of circumferential calcium here and this may be proximal graft as Dr. Arbie Cookey thought on his last evaluation.  MEDICAL ISSUES: Stable status postrepair of ruptured abdominal aortic aneurysm 2009.  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  Will check a duplex in 1 year to monitor.    Rande Brunt. Lenell Antu, MD [facility] Phone Number: [phone] 07/17/2023 3:09 PM   Note: Portions of this report may have been transcribed using voice recognition software.  Every effort has been made to ensure accuracy; however, inadvertent computerized transcription errors may still be present.

## 2023-07-17 ENCOUNTER — Ambulatory Visit: Payer: PPO | Admitting: Vascular Surgery

## 2023-07-17 ENCOUNTER — Encounter: Payer: Self-pay | Admitting: Vascular Surgery

## 2023-07-17 VITALS — BP 178/79 | HR 56 | Temp 97.9°F | Resp 20 | Ht 69.0 in | Wt 206.0 lb

## 2023-07-17 DIAGNOSIS — Z95828 Presence of other vascular implants and grafts: Secondary | ICD-10-CM

## 2023-07-17 DIAGNOSIS — Z8679 Personal history of other diseases of the circulatory system: Secondary | ICD-10-CM | POA: Diagnosis not present

## 2023-07-26 ENCOUNTER — Other Ambulatory Visit: Payer: Self-pay

## 2023-07-26 ENCOUNTER — Telehealth: Payer: Self-pay | Admitting: Family Medicine

## 2023-07-26 DIAGNOSIS — E119 Type 2 diabetes mellitus without complications: Secondary | ICD-10-CM

## 2023-07-26 DIAGNOSIS — E1159 Type 2 diabetes mellitus with other circulatory complications: Secondary | ICD-10-CM

## 2023-07-26 MED ORDER — FREESTYLE LIBRE 3 READER DEVI
1.0000 | Freq: Every day | 4 refills | Status: DC
Start: 2023-07-26 — End: 2024-01-22

## 2023-07-26 MED ORDER — FREESTYLE LIBRE 3 PLUS SENSOR MISC: 3 refills | Status: DC

## 2023-07-26 MED ORDER — METFORMIN HCL 1000 MG PO TABS
500.0000 mg | ORAL_TABLET | Freq: Two times a day (BID) | ORAL | 0 refills | Status: DC
Start: 2023-07-26 — End: 2023-08-29

## 2023-07-26 NOTE — Telephone Encounter (Signed)
Refill sent.

## 2023-07-26 NOTE — Telephone Encounter (Signed)
Prescription Request  07/26/2023  LOV: 06/21/2023  What is the name of the medication or equipment? Continuous Glucose Sensor (FREESTYLE LIBRE 3 SENSOR) MISC l  Have you contacted your pharmacy to request a refill? Yes   Which pharmacy would you like this sent to?  [facility] [address] [place] - [address] [place] [address] Phone: [phone] Fax: [phone]    Patient notified that their request is being sent to the clinical staff for review and that they should receive a response within 2 business days.   Please advise at Mobile [phone] (mobile)

## 2023-07-26 NOTE — Telephone Encounter (Signed)
error 

## 2023-07-27 ENCOUNTER — Other Ambulatory Visit: Payer: Self-pay

## 2023-07-27 DIAGNOSIS — I714 Abdominal aortic aneurysm, without rupture, unspecified: Secondary | ICD-10-CM

## 2023-08-01 ENCOUNTER — Other Ambulatory Visit: Payer: Self-pay | Admitting: Family Medicine

## 2023-08-29 ENCOUNTER — Ambulatory Visit (INDEPENDENT_AMBULATORY_CARE_PROVIDER_SITE_OTHER): Payer: PPO | Admitting: "Endocrinology

## 2023-08-29 ENCOUNTER — Encounter: Payer: Self-pay | Admitting: "Endocrinology

## 2023-08-29 VITALS — BP 144/76 | HR 56 | Ht 69.0 in | Wt 205.2 lb

## 2023-08-29 DIAGNOSIS — Z794 Long term (current) use of insulin: Secondary | ICD-10-CM | POA: Insufficient documentation

## 2023-08-29 DIAGNOSIS — E782 Mixed hyperlipidemia: Secondary | ICD-10-CM

## 2023-08-29 DIAGNOSIS — E1159 Type 2 diabetes mellitus with other circulatory complications: Secondary | ICD-10-CM | POA: Diagnosis not present

## 2023-08-29 DIAGNOSIS — I10 Essential (primary) hypertension: Secondary | ICD-10-CM

## 2023-08-29 LAB — POCT GLYCOSYLATED HEMOGLOBIN (HGB A1C): HbA1c, POC (controlled diabetic range): 7.4 % — AB (ref 0.0–7.0)

## 2023-08-29 MED ORDER — METFORMIN HCL 500 MG PO TABS: 500.0000 mg | ORAL_TABLET | Freq: Two times a day (BID) | ORAL | 1 refills | Status: DC

## 2023-08-29 NOTE — Progress Notes (Signed)
Endocrinology Consult Note       08/29/2023, 12:51 PM   Subjective:    Patient ID: Clayton Peterson, male    DOB: 08/12/1961.  Clayton Peterson is being seen in consultation for management of currently uncontrolled symptomatic diabetes requested by  Rica Records, FNP.   Past Medical History:  Diagnosis Date   AAA (abdominal aortic aneurysm) (HCC)    Coronary artery disease    Diabetes mellitus without complication (HCC)    Heart attack (HCC)    High cholesterol    Hypertension     Past Surgical History:  Procedure Laterality Date   ABDOMINAL AORTIC ANEURYSM REPAIR     BACK SURGERY     x2   BIOPSY  06/03/2021   Procedure: BIOPSY;  Surgeon: Lanelle Bal, DO;  [facility]: [facility];  Service: Endoscopy;;   BIOPSY  02/09/2023   Procedure: BIOPSY;  Surgeon: Benancio Deeds, MD;  Location: [facility];  Service: Gastroenterology;;   CARDIAC CATHETERIZATION     stent placed on right side ofo heart   COLONOSCOPY WITH PROPOFOL N/A 06/03/2021   Surgeon: Lanelle Bal, DO; Nonbleeding internal hemorrhoids, 6 mm polyp in sigmoid colon, 2 mm polyp in the ascending colon, congested and erythematous mucosa in the cecum biopsied.  Pathology revealed 2 tubular adenomas, cecal biopsy with polypoid colonic mucosa with ectatic vascular channels with small vascular malformation not ruled out. Repeat in 5 years.   CYST ENTEROSTOMY N/A 02/13/2023   Procedure: CYST GASTROSTOMY;  Surgeon: Meridee Score Netty Starring., MD;  Location: [facility] ENDOSCOPY;  Service: Gastroenterology;  Laterality: N/A;   CYST ENTEROSTOMY  04/02/2023   Procedure: CYST NECROSECTOMY;  Surgeon: Meridee Score Netty Starring., MD;  Location: [facility] ENDOSCOPY;  Service: Gastroenterology;;   ESOPHAGOGASTRODUODENOSCOPY N/A 02/11/2023   Procedure: ESOPHAGOGASTRODUODENOSCOPY (EGD);  Surgeon: Jeani Hawking, MD;  Location: [facility] ENDOSCOPY;  Service:  Gastroenterology;  Laterality: N/A;   ESOPHAGOGASTRODUODENOSCOPY (EGD) WITH PROPOFOL N/A 06/03/2021   Surgeon: Lanelle Bal, DO; Gastritis biopsied, nonobstructing, nonbleeding duodenal ulcer with no stigmata of bleeding, NSAID induced etiology.  Pathology with chronic gastritis, negative for H. pylori.   ESOPHAGOGASTRODUODENOSCOPY (EGD) WITH PROPOFOL N/A 02/09/2023   Procedure: ESOPHAGOGASTRODUODENOSCOPY (EGD) WITH PROPOFOL;  Surgeon: Benancio Deeds, MD;  Location: [facility] ENDOSCOPY;  Service: Gastroenterology;  Laterality: N/A;   ESOPHAGOGASTRODUODENOSCOPY (EGD) WITH PROPOFOL N/A 02/13/2023   Procedure: ESOPHAGOGASTRODUODENOSCOPY (EGD) WITH PROPOFOL;  Surgeon: Meridee Score Netty Starring., MD;  Location: [facility] ENDOSCOPY;  Service: Gastroenterology;  Laterality: N/A;   ESOPHAGOGASTRODUODENOSCOPY (EGD) WITH PROPOFOL N/A 02/22/2023   Procedure: ESOPHAGOGASTRODUODENOSCOPY (EGD) WITH PROPOFOL;  Surgeon: Meridee Score Netty Starring., MD;  Location: [facility] ENDOSCOPY;  Service: Gastroenterology;  Laterality: N/A;   ESOPHAGOGASTRODUODENOSCOPY (EGD) WITH PROPOFOL N/A 04/02/2023   Procedure: ESOPHAGOGASTRODUODENOSCOPY (EGD) WITH PROPOFOL;  Surgeon: Meridee Score Netty Starring., MD;  Location: [facility] ENDOSCOPY;  Service: Gastroenterology;  Laterality: N/A;   EUS Left 02/11/2023   Procedure: UPPER ENDOSCOPIC ULTRASOUND (EUS) LINEAR;  Surgeon: Jeani Hawking, MD;  Location: [facility] ENDOSCOPY;  Service: Gastroenterology;  Laterality: Left;   EUS N/A 02/13/2023   Procedure: UPPER ENDOSCOPIC ULTRASOUND (EUS) LINEAR;  Surgeon: Lemar Lofty., MD;  Location:  [facility]OSCOPY;  Service: Gastroenterology;  Laterality: N/A;   KNEE SURGERY Left    LEFT HEART CATH AND CORONARY ANGIOGRAPHY N/A 02/07/2023   Procedure: LEFT HEART CATH AND CORONARY ANGIOGRAPHY;  Surgeon: Kathleene Hazel, MD;  Location: [facility] INVASIVE CV LAB;  Service: Cardiovascular;  Laterality: N/A;   PANCREATIC STENT PLACEMENT  02/13/2023   Procedure: PANCREATIC STENT  PLACEMENT;  Surgeon: Meridee Score Netty Starring., MD;  Location: [facility] ENDOSCOPY;  Service: Gastroenterology;;   PANCREATIC STENT PLACEMENT  02/22/2023   Procedure: PANCREATIC STENT PLACEMENT;  Surgeon: Lemar Lofty., MD;  Location: [facility] ENDOSCOPY;  Service: Gastroenterology;;   PARTIAL COLECTOMY     POLYPECTOMY  06/03/2021   Procedure: POLYPECTOMY INTESTINAL;  Surgeon: Lanelle Bal, DO;  [facility]: [facility];  Service: Endoscopy;;   STENT REMOVAL  02/22/2023   Procedure: STENT REMOVAL;  Surgeon: Lemar Lofty., MD;  Location: [facility] ENDOSCOPY;  Service: Gastroenterology;;   Francine Graven REMOVAL  04/02/2023   Procedure: STENT REMOVAL;  Surgeon: Lemar Lofty., MD;  Location: [facility] ENDOSCOPY;  Service: Gastroenterology;;    Social History   Socioeconomic History   Marital status: Married    Spouse name: Not on file   Number of children: Not on file   Years of education: Not on file   Highest education level: Associate degree: occupational, Scientist, product/process development, or vocational program  Occupational History   Occupation: disabled, former truck driver  Tobacco Use   Smoking status: Every Day    Current packs/day: 1.50    Average packs/day: 1.5 packs/day for 40.0 years (60.0 ttl pk-yrs)    Types: Cigarettes    Passive exposure: Current   Smokeless tobacco: Never   Tobacco comments:    Smokes 0.5 PPD--03/15/2023.  Vaping Use   Vaping status: Never Used  Substance and Sexual Activity   Alcohol use: Yes    Comment: very seldom   Drug use: Never   Sexual activity: Yes  Other Topics Concern   Not on file  Social History Narrative   Not on file   Social Determinants of Health   Financial Resource Strain: Low Risk  (04/18/2023)   Overall Financial Resource Strain (CARDIA)    Difficulty of Paying Living Expenses: Not hard at all  Food Insecurity: No Food Insecurity (04/18/2023)   Hunger Vital Sign    Worried About Running Out of Food in the Last Year: Never true    Ran Out of Food in  the Last Year: Never true  Transportation Needs: No Transportation Needs (04/18/2023)   PRAPARE - Administrator, Civil Service (Medical): No    Lack of Transportation (Non-Medical): No  Physical Activity: Insufficiently Active (04/18/2023)   Exercise Vital Sign    Days of Exercise per Week: 7 days    Minutes of Exercise per Session: 20 min  Stress: No Stress Concern Present (04/18/2023)   [employer] of Occupational Health - Occupational Stress Questionnaire    Feeling of Stress : Not at all  Social Connections: Moderately Isolated (04/18/2023)   Social Connection and Isolation Panel [NHANES]    Frequency of Communication with Friends and Family: More than three times a week    Frequency of Social Gatherings with Friends and Family: More than three times a week    Attends Religious Services: Never    Database administrator or Organizations: No    Attends Banker Meetings: Never    Marital Status: Married    Family History  Problem Relation Age of  Onset   Atrial fibrillation Mother    Alzheimer's disease Mother    Diabetes Brother    Alzheimer's disease Maternal Grandmother    Aneurysm Maternal Grandfather    Pancreatitis Neg Hx    Pancreatic cancer Neg Hx    Colon cancer Neg Hx     Outpatient Encounter Medications as of 08/29/2023  Medication Sig   acetaminophen (TYLENOL) 650 MG CR tablet Take 1,300 mg by mouth every 8 (eight) hours as needed for pain.   amLODipine (NORVASC) 10 MG tablet Take 1 tablet (10 mg total) by mouth daily.   aspirin 81 MG EC tablet Take 81 mg by mouth daily.   Cholecalciferol (VITAMIN D3) 125 MCG (5000 UT) TABS Take 1 tablet by mouth daily.   Continuous Glucose Receiver (FREESTYLE LIBRE 3 READER) DEVI 1 each by Does not apply route daily.   Continuous Glucose Sensor (FREESTYLE LIBRE 3 PLUS SENSOR) MISC Change sensor every 15 days.   fenofibrate micronized (LOFIBRA) 200 MG capsule Take 200 mg by mouth daily before breakfast.    gabapentin (NEURONTIN) 100 MG capsule Take 1 capsule by mouth once daily   glipiZIDE (GLUCOTROL XL) 5 MG 24 hr tablet Take 1 tablet (5 mg total) by mouth daily with breakfast.   insulin glargine (LANTUS) 100 UNIT/ML injection Inject 0.2 mLs (20 Units total) into the skin at bedtime.   metFORMIN (GLUCOPHAGE) 500 MG tablet Take 1 tablet (500 mg total) by mouth 2 (two) times daily with a meal.   metoprolol tartrate (LOPRESSOR) 50 MG tablet Take 1 tablet (50 mg total) by mouth 2 (two) times daily.   nitroGLYCERIN (NITROSTAT) 0.4 MG SL tablet Place 1 tablet (0.4 mg total) under the tongue every 5 (five) minutes as needed for chest pain.   Omega-3 Fatty Acids (OMEGA 3 PO) Take 2,000 mg by mouth 2 (two) times daily.   ONETOUCH ULTRA test strip USE 1 STRIP TO CHECK GLUCOSE THREE TIMES DAILY   pantoprazole (PROTONIX) 40 MG tablet Take 1 tablet (40 mg total) by mouth 2 (two) times daily.   RELION PEN NEEDLE 31G/8MM 31G X 8 MM MISC    rosuvastatin (CRESTOR) 40 MG tablet Take 1 tablet (40 mg total) by mouth at bedtime.   [DISCONTINUED] insulin glargine (LANTUS SOLOSTAR) 100 UNIT/ML Solostar Pen Inject 20 Units into the skin daily.   [DISCONTINUED] metFORMIN (GLUCOPHAGE) 1000 MG tablet Take 0.5 tablets (500 mg total) by mouth 2 (two) times daily with a meal.   No facility-administered encounter medications on file as of 08/29/2023.    ALLERGIES: Allergies  Allergen Reactions   Bee Venom Anaphylaxis   Coconut (Cocos Nucifera) Anaphylaxis    VACCINATION STATUS: Immunization History  Administered Date(s) Administered   Influenza Inj Mdck Quad Pf 08/31/2022   Influenza Split 01/09/2017, 09/13/2019   Influenza, Seasonal, Injecte, Preservative Fre 06/21/2023    Diabetes He presents for his follow-up diabetic visit. He has type 2 diabetes mellitus. Onset time: He was diagnosed at approximate age of 70 years. His disease course has been fluctuating. There are no hypoglycemic associated symptoms.  Pertinent negatives for hypoglycemia include no confusion, headaches, pallor or seizures. Pertinent negatives for diabetes include no chest pain, no fatigue, no polydipsia, no polyphagia, no polyuria and no weakness. There are no hypoglycemic complications. Symptoms are stable. Diabetic complications include heart disease and nephropathy. Risk factors for coronary artery disease include family history, dyslipidemia, diabetes mellitus, male sex, obesity, sedentary lifestyle and tobacco exposure. Current diabetic treatments: He is currently on an  Lantus to 8 units twice daily Jardiance 10 mg p.o. daily, metformin 1000 p.o. twice daily and   glimepiride 4 mg p.o. twice daily. His weight is decreasing steadily. He is following a generally unhealthy diet. When asked about meal planning, he reported none. He never participates in exercise. His home blood glucose trend is decreasing steadily. His breakfast blood glucose range is generally 140-180 mg/dl. His lunch blood glucose range is generally 140-180 mg/dl. His dinner blood glucose range is generally 140-180 mg/dl. His bedtime blood glucose range is generally 140-180 mg/dl. His overall blood glucose range is 140-180 mg/dl. (He is benefiting from his CGM device.  AGP report shows 76% time range, 21% level 1 hyperglycemia, 3% level 2 hyperglycemia.  His average blood glucose is 154 point-of-care A1c was 7.4%, progressively improving from 8.7%.   )  Hyperlipidemia This is a chronic problem. The current episode started more than 1 year ago. Exacerbating diseases include diabetes. Pertinent negatives include no chest pain or myalgias. Current antihyperlipidemic treatment includes statins. Risk factors for coronary artery disease include diabetes mellitus, dyslipidemia, hypertension, male sex and a sedentary lifestyle.     Review of Systems  Constitutional:  Negative for chills, fatigue, fever and unexpected weight change.  HENT:  Negative for dental problem, mouth  sores and trouble swallowing.   Eyes:  Negative for visual disturbance.  Respiratory:  Negative for wheezing.   Cardiovascular:  Negative for chest pain, palpitations and leg swelling.  Gastrointestinal:  Negative for abdominal distention, abdominal pain, constipation, diarrhea, nausea and vomiting.  Endocrine: Negative for polydipsia, polyphagia and polyuria.  Genitourinary:  Negative for dysuria, flank pain, hematuria and urgency.  Musculoskeletal:  Negative for back pain, gait problem, myalgias and neck pain.  Skin:  Negative for pallor, rash and wound.  Neurological:  Negative for seizures, syncope, weakness, numbness and headaches.  Psychiatric/Behavioral:  Negative for confusion and dysphoric mood.     Objective:       08/29/2023   11:21 AM 07/17/2023    1:12 PM 06/21/2023    8:45 AM  Vitals with BMI  Height 5\' 9"  5\' 9"    Weight 205 lbs 3 oz 206 lbs   BMI 30.29 30.41   Systolic 144 178 301  Diastolic 76 79 89  Pulse 56 56     BP (!) 144/76   Pulse (!) 56   Ht 5\' 9"  (1.753 m)   Wt 205 lb 3.2 oz (93.1 kg)   BMI 30.30 kg/m   Wt Readings from Last 3 Encounters:  08/29/23 205 lb 3.2 oz (93.1 kg)  07/17/23 206 lb (93.4 kg)  06/21/23 208 lb (94.3 kg)         CMP ( most recent) CMP     Component Value Date/Time   NA 137 06/21/2023 0957   K 4.5 06/21/2023 0957   CL 102 06/21/2023 0957   CO2 21 06/21/2023 0957   GLUCOSE 149 (H) 06/21/2023 0957   GLUCOSE 127 (H) 02/15/2023 0249   BUN 22 06/21/2023 0957   CREATININE 1.48 (H) 06/21/2023 0957   CALCIUM 9.5 06/21/2023 0957   PROT 6.3 06/21/2023 0957   ALBUMIN 4.4 06/21/2023 0957   AST 9 06/21/2023 0957   ALT 10 06/21/2023 0957   ALKPHOS 63 06/21/2023 0957   BILITOT 0.4 06/21/2023 0957   GFRNONAA 58 (L) 02/15/2023 0249     Diabetic Labs (most recent): Lab Results  Component Value Date   HGBA1C 7.4 (A) 08/29/2023   HGBA1C 7.5 (H) 04/02/2023  HGBA1C 8.7 (H) 12/29/2022     Lipid Panel ( most recent) Lipid  Panel     Component Value Date/Time   CHOL 145 06/21/2023 0957   TRIG 131 06/21/2023 0957   HDL 30 (L) 06/21/2023 0957   CHOLHDL 4.8 06/21/2023 0957   CHOLHDL 5.7 02/15/2022 0406   VLDL UNABLE TO CALCULATE IF TRIGLYCERIDE OVER 400 mg/dL 29/52/8413 2440   LDLCALC 91 06/21/2023 0957   LDLDIRECT 64 05/25/2022 0844   LDLDIRECT 26.0 02/15/2022 0406   LABVLDL 24 06/21/2023 0957      Lab Results  Component Value Date   TSH 1.380 12/29/2022   FREET4 1.34 12/29/2022     Assessment & Plan:   1. DM type 2 causing vascular disease (HCC)  - Lowry Ram Bakula has currently uncontrolled symptomatic type 2 DM since  62 years of age.  He is benefiting from his CGM device.  AGP report shows 76% time range, 21% level 1 hyperglycemia, 3% level 2 hyperglycemia.  His average blood glucose is 154 point-of-care A1c was 7.4%, progressively improving from 8.7%.    -Recent labs reviewed. - I had a long discussion with him about the possible risk factors and  the pathology behind its diabetes and its complications. -his diabetes is complicated by coronary artery disease, CKD, chronic heavy smoking, comorbid hypertension hyperlipidemia, history of pancreatitis and he remains at a high risk for more acute and chronic complications which include CAD, CVA, CKD, retinopathy, and neuropathy. These are all discussed in detail with him.  - I discussed all available options of managing his diabetes including de-escalation of medications. I have counseled him on Food as Medicine by adopting a Whole Food , Plant Predominant  ( WFPP) nutrition as recommended by [employer] of Lifestyle Medicine. Patient is encouraged to switch to  unprocessed or minimally processed  complex starch, adequate protein intake (mainly plant source), minimal liquid fat, plenty of fruits, and vegetables. -  he is advised to stick to a routine mealtimes to eat 3 complete meals a day and snack only when necessary ( to snack only to correct  hypoglycemia BG <70 day time or <100 at night).   - he acknowledges that there is a room for improvement in his food and drink choices. - Further Specific Suggestion is made for him to avoid simple carbohydrates  from his diet including Cakes, Sweet Desserts, Ice Cream, Soda (diet and regular), Sweet Tea, Candies, Chips, Cookies, Store Bought Juices, Alcohol ,  Artificial Sweeteners,  Coffee Creamer, and "Sugar-free" Products. This will help patient to have more stable blood glucose profile and potentially avoid unintended weight gain.  - he will be scheduled with Norm Salt, RDN, CDE for individualized diabetes education.  - I have approached him with the following individualized plan to manage  his diabetes and patient agrees:   -In light of his history of pancreatitis which likely led to his diabetes, he has limited options to treat his diabetes. - He presents with near target glycemic profile, advised to continue Lantus 20 units nightly.  He would not need prandial insulin for now.  He is advised to utilize his CGM continuously.    - he is encouraged to call clinic for blood glucose levels less than 70 or above 200 mg /dl. -He is advised to continue metformin 500 mg p.o. twice daily.  He is not getting enough coverage for Jardiance, reports that he cannot afford it.   -He is advised to continue glipizide 5 mg XL  p.o. daily at breakfast.  He is advised to continue glipizide 5 mg XL p.o. daily at breakfast.    He is not a suitable candidate for GLP-1 receptor agonist due to his problematic history of pancreatitis.  - Specific targets for  A1c;  LDL, HDL,  and Triglycerides were discussed with the patient.  2) Blood Pressure /Hypertension:  Blood pressure is not controlled to target.     he is advised to continue his current medications including Lisinopril 20 mg p.o. daily with breakfast . 3) Lipids/Hyperlipidemia:   Review of his recent lipid panel showed controlled  LDL at  73.  he   is advised to continue Crestor 40 mg p.o. daily at bedtime.  Side effects and precautions discussed with him.  4)  Weight/Diet:  Body mass index is 30.3 kg/m.  -     he is a candidate for modest weight loss. I discussed with him the fact that loss of 5 - 10% of his  current body weight will have the most impact on his diabetes management.  The above detailed  ACLM recommendations for nutrition, exercise, sleep, social life, avoidance of risky substances, the need for restorative sleep   information will also detailed on discharge instructions. Patient is on Creon due to previous pancreatitis.  5) Chronic Care/Health Maintenance:  -he  is on ACEI/ARB and Statin medications and  is encouraged to initiate and continue to follow up with Ophthalmology, Dentist,  Podiatrist at least yearly or according to recommendations, and advised to quit smoking. I have recommended yearly flu vaccine and pneumonia vaccine at least every 5 years; moderate intensity exercise for up to 150 minutes weekly; and  sleep for 7- 9 hours a day.  The patient was counseled on the dangers of tobacco use, and was advised to quit.  Reviewed strategies to maximize success, including removing cigarettes and smoking materials from environment.   - he is  advised to maintain close follow up with Del Nigel Berthold, FNP for primary care needs, as well as his other providers for optimal and coordinated care.   I spent  26  minutes in the care of the patient today including review of labs from CMP, Lipids, Thyroid Function, Hematology (current and previous including abstractions from other facilities); face-to-face time discussing  his blood glucose readings/logs, discussing hypoglycemia and hyperglycemia episodes and symptoms, medications doses, his options of short and long term treatment based on the latest standards of care / guidelines;  discussion about incorporating lifestyle medicine;  and documenting the encounter. Risk  reduction counseling performed per USPSTF guidelines to reduce  obesity and cardiovascular risk factors.     Please refer to Patient Instructions for Blood Glucose Monitoring and Insulin/Medications Dosing Guide"  in media tab for additional information. Please  also refer to " Patient Self Inventory" in the Media  tab for reviewed elements of pertinent patient history.  Rishard Delange Stang participated in the discussions, expressed understanding, and voiced agreement with the above plans.  All questions were answered to his satisfaction. he is encouraged to contact clinic should he have any questions or concerns prior to his return visit.     Follow up plan: - Return in about 4 months (around 12/27/2023) for F/U with Pre-visit Labs, Meter/CGM/Logs, A1c here.  Marquis Lunch, MD [facility] [address] [place], [place] [address] Phone: [phone]  Fax: [phone]    08/29/2023, 12:51 PM  This note was partially dictated with voice  recognition software. Similar sounding words can be transcribed inadequately or may not  be corrected upon review.

## 2023-08-29 NOTE — Patient Instructions (Signed)
                                     Advice for Weight Management  -For most of us the best way to lose weight is by diet management. Generally speaking, diet management means consuming less calories intentionally which over time brings about progressive weight loss.  This can be achieved more effectively by avoiding ultra processed carbohydrates, processed meats, unhealthy fats.    It is critically important to know your numbers: how much calorie you are consuming and how much calorie you need. More importantly, our carbohydrates sources should be unprocessed naturally occurring  complex starch food items.  It is always important to balance nutrition also by  appropriate intake of proteins (mainly plant-based), healthy fats/oils, plenty of fruits and vegetables.   -The American College of Lifestyle Medicine (ACL M) recommends nutrition derived mostly from Whole Food, Plant Predominant Sources example an apple instead of applesauce or apple pie. Eat Plenty of vegetables, Mushrooms, fruits, Legumes, Whole Grains, Nuts, seeds in lieu of processed meats, processed snacks/pastries red meat, poultry, eggs.  Use only water or unsweetened tea for hydration.  The College also recommends the need to stay away from risky substances including alcohol, smoking; obtaining 7-9 hours of restorative sleep, at least 150 minutes of moderate intensity exercise weekly, importance of healthy social connections, and being mindful of stress and seek help when it is overwhelming.    -Sticking to a routine mealtime to eat 3 meals a day and avoiding unnecessary snacks is shown to have a big role in weight control. Under normal circumstances, the only time we burn stored energy is when we are hungry, so allow  some hunger to take place- hunger means no food between appropriate meal times, only water.  It is not advisable to starve.   -It is better to avoid simple carbohydrates including:  Cakes, Sweet Desserts, Ice Cream, Soda (diet and regular), Sweet Tea, Candies, Chips, Cookies, Store Bought Juices, Alcohol in Excess of  1-2 drinks a day, Lemonade,  Artificial Sweeteners, Doughnuts, Coffee Creamers, "Sugar-free" Products, etc, etc.  This is not a complete list.....    -Consulting with certified diabetes educators is proven to provide you with the most accurate and current information on diet.  Also, you may be  interested in discussing diet options/exchanges , we can schedule a visit with Clayton Peterson, RDN, CDE for individualized nutrition education.  -Exercise: If you are able: 30 -60 minutes a day ,4 days a week, or 150 minutes of moderate intensity exercise weekly.    The longer the better if tolerated.  Combine stretch, strength, and aerobic activities.  If you were told in the past that you have high risk for cardiovascular diseases, or if you are currently symptomatic, you may seek evaluation by your heart doctor prior to initiating moderate to intense exercise programs.                                  Additional Care Considerations for Diabetes/Prediabetes   -Diabetes  is a chronic disease.  The most important care consideration is regular follow-up with your diabetes care provider with the goal being avoiding or delaying its complications and to take advantage of advances in medications and technology.  If appropriate actions are taken early enough, type 2 diabetes can even be   reversed.  Seek information from the right source.  - Whole Food, Plant Predominant Nutrition is highly recommended: Eat Plenty of vegetables, Mushrooms, fruits, Legumes, Whole Grains, Nuts, seeds in lieu of processed meats, processed snacks/pastries red meat, poultry, eggs as recommended by American College of  Lifestyle Medicine (ACLM).  -Type 2 diabetes is known to coexist with other important comorbidities such as high blood pressure and high cholesterol.  It is critical to control not only the  diabetes but also the high blood pressure and high cholesterol to minimize and delay the risk of complications including coronary artery disease, stroke, amputations, blindness, etc.  The good news is that this diet recommendation for type 2 diabetes is also very helpful for managing high cholesterol and high blood blood pressure.  - Studies showed that people with diabetes will benefit from a class of medications known as ACE inhibitors and statins.  Unless there are specific reasons not to be on these medications, the standard of care is to consider getting one from these groups of medications at an optimal doses.  These medications are generally considered safe and proven to help protect the heart and the kidneys.    - People with diabetes are encouraged to initiate and maintain regular follow-up with eye doctors, foot doctors, dentists , and if necessary heart and kidney doctors.     - It is highly recommended that people with diabetes quit smoking or stay away from smoking, and get yearly  flu vaccine and pneumonia vaccine at least every 5 years.  See above for additional recommendations on exercise, sleep, stress management , and healthy social connections.      

## 2023-09-01 ENCOUNTER — Other Ambulatory Visit: Payer: Self-pay | Admitting: Family Medicine

## 2023-09-16 ENCOUNTER — Other Ambulatory Visit: Payer: Self-pay | Admitting: Family Medicine

## 2023-09-18 ENCOUNTER — Encounter: Payer: Self-pay | Admitting: Internal Medicine

## 2023-09-18 ENCOUNTER — Ambulatory Visit: Payer: PPO | Attending: Internal Medicine | Admitting: Internal Medicine

## 2023-09-18 VITALS — BP 140/78 | HR 48 | Ht 69.0 in | Wt 207.4 lb

## 2023-09-18 DIAGNOSIS — Z8719 Personal history of other diseases of the digestive system: Secondary | ICD-10-CM | POA: Diagnosis not present

## 2023-09-18 DIAGNOSIS — I10 Essential (primary) hypertension: Secondary | ICD-10-CM

## 2023-09-18 DIAGNOSIS — I2510 Atherosclerotic heart disease of native coronary artery without angina pectoris: Secondary | ICD-10-CM | POA: Diagnosis not present

## 2023-09-18 DIAGNOSIS — Z79899 Other long term (current) drug therapy: Secondary | ICD-10-CM | POA: Diagnosis not present

## 2023-09-18 MED ORDER — LISINOPRIL 20 MG PO TABS: 20.0000 mg | ORAL_TABLET | Freq: Every day | ORAL | 3 refills | Status: DC

## 2023-09-18 NOTE — Patient Instructions (Signed)
Medication Instructions:  START lisinopril 20mg  once daily for BP  *If you need a refill on your cardiac medications before your next appointment, please call your pharmacy*   Lab Work: Non-Fasting BMET in 2 weeks (~ 12/10-12/13) OR with labs thru PCP  If you have labs (blood work) drawn today and your tests are completely normal, you will receive your results only by: MyChart Message (if you have MyChart) OR A paper copy in the mail If you have any lab test that is abnormal or we need to change your treatment, we will call you to review the results.    Follow-Up: At [facility], you and your health needs are our priority.  As part of our continuing mission to provide you with exceptional heart care, we have created designated Provider Care Teams.  These Care Teams include your primary Cardiologist (physician) and Advanced Practice Providers (APPs -  Physician Assistants and Nurse Practitioners) who all work together to provide you with the care you need, when you need it.  We recommend signing up for the patient portal called "MyChart".  Sign up information is provided on this After Visit Summary.  MyChart is used to connect with patients for Virtual Visits (Telemedicine).  Patients are able to view lab/test results, encounter notes, upcoming appointments, etc.  Non-urgent messages can be sent to your provider as well.   To learn more about what you can do with MyChart, go to [email or web address].    Your next appointment:   6 months with Dr. Rennis Golden or PA/NP

## 2023-09-18 NOTE — Progress Notes (Signed)
LIPID CLINIC CONSULT NOTE  Chief Complaint:  Follow-up   Primary Care Physician: Rica Records, FNP  Primary Cardiologist:  Chrystie Nose, MD  HPI:  Clayton Peterson is a 62 y.o. male who is being seen today for the evaluation of dyslipidemia at the request of Del York Pellant*. This is a pleasant 62 year old male who was referred for evaluation management of dyslipidemia.  He does have a history of heart disease including a prior heart attack in 2012 at which time he was found to have coronary artery disease including stenosis in the mid RCA and underwent a vision MultiLink stent (3.0 x 28 mm).  Prior to that, he underwent open repair of a AAA which apparently burst on the table as they were planning to repair it via a stent graft.  He also has type 2 diabetes, hypertension, and family history of early onset heart disease.  More recently he had an episode of pancreatitis and at that time was noted to have very high triglycerides of 709.  After additional therapy, labs 3 weeks ago showed total cholesterol 102, triglycerides 420, HDL 18 and LDL 26.  Currently is asymptomatic denying chest pain worsening shortness of breath or abdominal pain.  He reports a varied diet and work to try and reduce sugars and saturated fats.  05/29/2022  Clayton Peterson returns today for follow-up of dyslipidemia.  Lipids do show improvement after making adjustment in his meds.  LDL particle numbers now 992, LDL-C of 59, HDL-C of 27, and triglycerides 305 (down from 420 and previously 709).  He did undergo CT that I ordered to follow-up on AAA repair which was performed emergently back in 2009.  This shows aneurysm around the repair up to 4.4 cm.  No evidence of endoleak.  He was also noted to have a pancreatic pseudocyst denoting resolution of recent pancreatitis.  He is followed by GI.  03/15/2023  Clayton Peterson is seen today in follow-up.  Unfortunately was hospitalized in April with chest pain.  He  thought he was having a heart attack.  He ultimately underwent cardiac catheterization which showed an occlusion of the RCA at the area of the previously placed stent.  There were left-to-right collaterals.  Otherwise there was mild proximal LAD stenosis and some mild plaque in the circumflex.  Medical therapy was recommended.  An echo was performed which I personally reviewed and showed normal LV function.  Subsequently was found to have a severe pancreatic pseudocyst which was the cause of his pain.  He underwent stenting and management by Dr. Meridee Score.  Since then he has done fairly well.  He does have a repeat EGD scheduled in June.  His lipids have not been too bad.  An LP(a) was performed in the hospital was negative at 35.3 nmol/L.  His last lipid profile showed total cholesterol 144, triglycerides 218, HDL 35 and LDL 73.  He has been compliant with his medications.  His blood sugar has not been recently well-controlled however he is on an adjusted regimen now.  09/18/2023  Clayton Peterson returns today for follow-up.  He reports no worsening or recurrent pain related to his pancreatic pseudocyst.  Denies any cardiac sounding chest pain.  His A1c is trending upwards somewhat.  Recently was 7.4% however continuous glucose monitor shows about 7.9%.  LDL 2 months ago was 91, total cholesterol 145, triglycerides 131 HDL 30.  His LDL previously had been about 72-73.  This may be dietary related.  While he was hospitalized he was taken off of lisinopril, possibly due to renal function.  He is on amlodipine and metoprolol but blood pressure remains elevated.  Initial blood pressure was 154/76 and recheck came down to 140/78.  PMHx:  Past Medical History:  Diagnosis Date   AAA (abdominal aortic aneurysm) (HCC)    Coronary artery disease    Diabetes mellitus without complication (HCC)    Heart attack (HCC)    High cholesterol    Hypertension     Past Surgical History:  Procedure Laterality Date    ABDOMINAL AORTIC ANEURYSM REPAIR     BACK SURGERY     x2   BIOPSY  06/03/2021   Procedure: BIOPSY;  Surgeon: Lanelle Bal, DO;  Location: [facility];  Service: Endoscopy;;   BIOPSY  02/09/2023   Procedure: BIOPSY;  Surgeon: Benancio Deeds, MD;  Location: [facility];  Service: Gastroenterology;;   CARDIAC CATHETERIZATION     stent placed on right side ofo heart   COLONOSCOPY WITH PROPOFOL N/A 06/03/2021   Surgeon: Lanelle Bal, DO; Nonbleeding internal hemorrhoids, 6 mm polyp in sigmoid colon, 2 mm polyp in the ascending colon, congested and erythematous mucosa in the cecum biopsied.  Pathology revealed 2 tubular adenomas, cecal biopsy with polypoid colonic mucosa with ectatic vascular channels with small vascular malformation not ruled out. Repeat in 5 years.   CYST ENTEROSTOMY N/A 02/13/2023   Procedure: CYST GASTROSTOMY;  Surgeon: Meridee Score Netty Starring., MD;  Location: [facility] ENDOSCOPY;  Service: Gastroenterology;  Laterality: N/A;   CYST ENTEROSTOMY  04/02/2023   Procedure: CYST NECROSECTOMY;  Surgeon: Meridee Score Netty Starring., MD;  Location: [facility] ENDOSCOPY;  Service: Gastroenterology;;   ESOPHAGOGASTRODUODENOSCOPY N/A 02/11/2023   Procedure: ESOPHAGOGASTRODUODENOSCOPY (EGD);  Surgeon: Jeani Hawking, MD;  Location: [facility] ENDOSCOPY;  Service: Gastroenterology;  Laterality: N/A;   ESOPHAGOGASTRODUODENOSCOPY (EGD) WITH PROPOFOL N/A 06/03/2021   Surgeon: Lanelle Bal, DO; Gastritis biopsied, nonobstructing, nonbleeding duodenal ulcer with no stigmata of bleeding, NSAID induced etiology.  Pathology with chronic gastritis, negative for H. pylori.   ESOPHAGOGASTRODUODENOSCOPY (EGD) WITH PROPOFOL N/A 02/09/2023   Procedure: ESOPHAGOGASTRODUODENOSCOPY (EGD) WITH PROPOFOL;  Surgeon: Benancio Deeds, MD;  Location: [facility] ENDOSCOPY;  Service: Gastroenterology;  Laterality: N/A;   ESOPHAGOGASTRODUODENOSCOPY (EGD) WITH PROPOFOL N/A 02/13/2023   Procedure: ESOPHAGOGASTRODUODENOSCOPY (EGD) WITH  PROPOFOL;  Surgeon: Meridee Score Netty Starring., MD;  Location: [facility] ENDOSCOPY;  Service: Gastroenterology;  Laterality: N/A;   ESOPHAGOGASTRODUODENOSCOPY (EGD) WITH PROPOFOL N/A 02/22/2023   Procedure: ESOPHAGOGASTRODUODENOSCOPY (EGD) WITH PROPOFOL;  Surgeon: Meridee Score Netty Starring., MD;  Location: [facility] ENDOSCOPY;  Service: Gastroenterology;  Laterality: N/A;   ESOPHAGOGASTRODUODENOSCOPY (EGD) WITH PROPOFOL N/A 04/02/2023   Procedure: ESOPHAGOGASTRODUODENOSCOPY (EGD) WITH PROPOFOL;  Surgeon: Meridee Score Netty Starring., MD;  Location: [facility] ENDOSCOPY;  Service: Gastroenterology;  Laterality: N/A;   EUS Left 02/11/2023   Procedure: UPPER ENDOSCOPIC ULTRASOUND (EUS) LINEAR;  Surgeon: Jeani Hawking, MD;  Location: [facility] ENDOSCOPY;  Service: Gastroenterology;  Laterality: Left;   EUS N/A 02/13/2023   Procedure: UPPER ENDOSCOPIC ULTRASOUND (EUS) LINEAR;  Surgeon: Lemar Lofty., MD;  Location: [facility] ENDOSCOPY;  Service: Gastroenterology;  Laterality: N/A;   KNEE SURGERY Left    LEFT HEART CATH AND CORONARY ANGIOGRAPHY N/A 02/07/2023   Procedure: LEFT HEART CATH AND CORONARY ANGIOGRAPHY;  Surgeon: Kathleene Hazel, MD;  Location: [facility]VAS[facility] CV [facility];  Service: Cardiovascular;  Laterality: N/A;   PANCREATIC STENT PLACEMENT  02/13/2023   Procedure: PANCREATIC STENT PLACEMENT;  Surgeon: Meridee Score Netty Starring., MD;  Location: [facility] ENDOSCOPY;  Service: Gastroenterology;;   PANCREATIC STENT PLACEMENT  02/22/2023   Procedure: PANCREATIC STENT PLACEMENT;  Surgeon: Lemar Lofty., MD;  Location: [facility]OSCOPY;  Service: Gastroenterology;;   PARTIAL COLECTOMY     POLYPECTOMY  06/03/2021   Procedure: POLYPECTOMY INTESTINAL;  Surgeon: Lanelle Bal, DO;  Location: [facility];  Service: Endoscopy;;   STENT REMOVAL  02/22/2023   Procedure: STENT REMOVAL;  Surgeon: Lemar Lofty., MD;  Location: [facility]OSCOPY;  Service: Gastroenterology;;   Francine Graven REMOVAL  04/02/2023   Procedure: STENT REMOVAL;  Surgeon:  Lemar Lofty., MD;  Location: [facility] ENDOSCOPY;  Service: Gastroenterology;;    FAMHx:  Family History  Problem Relation Age of Onset   Atrial fibrillation Mother    Alzheimer's disease Mother    Diabetes Brother    Alzheimer's disease Maternal Grandmother    Aneurysm Maternal Grandfather    Pancreatitis Neg Hx    Pancreatic cancer Neg Hx    Colon cancer Neg Hx     SOCHx:   reports that he has been smoking cigarettes. He has a 60 pack-year smoking history. He has been exposed to tobacco smoke. He has never used smokeless tobacco. He reports current alcohol use. He reports that he does not use drugs.  ALLERGIES:  Allergies  Allergen Reactions   Bee Venom Anaphylaxis   Coconut (Cocos Nucifera) Anaphylaxis    ROS: Pertinent items noted in HPI and remainder of comprehensive ROS otherwise negative.  HOME MEDS: Current Outpatient Medications on File Prior to Visit  Medication Sig Dispense Refill   acetaminophen (TYLENOL) 650 MG CR tablet Take 1,300 mg by mouth every 8 (eight) hours as needed for pain.     amLODipine (NORVASC) 10 MG tablet Take 1 tablet (10 mg total) by mouth daily. 30 tablet 3   aspirin 81 MG EC tablet Take 81 mg by mouth daily.     Cholecalciferol (VITAMIN D3) 125 MCG (5000 UT) TABS Take 1 tablet by mouth daily.     fenofibrate micronized (LOFIBRA) 200 MG capsule Take 200 mg by mouth daily before breakfast.     gabapentin (NEURONTIN) 100 MG capsule Take 1 capsule by mouth once daily 30 capsule 0   glipiZIDE (GLUCOTROL XL) 5 MG 24 hr tablet Take 1 tablet (5 mg total) by mouth daily with breakfast. 90 tablet 1   insulin glargine (LANTUS) 100 UNIT/ML injection Inject 0.2 mLs (20 Units total) into the skin at bedtime. 10 mL 1   metFORMIN (GLUCOPHAGE) 500 MG tablet Take 1 tablet (500 mg total) by mouth 2 (two) times daily with a meal. 180 tablet 1   metoprolol tartrate (LOPRESSOR) 50 MG tablet Take 1 tablet (50 mg total) by mouth 2 (two) times daily. 60 tablet 3    nitroGLYCERIN (NITROSTAT) 0.4 MG SL tablet Place 1 tablet (0.4 mg total) under the tongue every 5 (five) minutes as needed for chest pain. 50 tablet 3   Omega-3 Fatty Acids (OMEGA 3 PO) Take 2,000 mg by mouth 2 (two) times daily.     pantoprazole (PROTONIX) 40 MG tablet Take 1 tablet (40 mg total) by mouth 2 (two) times daily. 60 tablet 3   rosuvastatin (CRESTOR) 40 MG tablet Take 1 tablet (40 mg total) by mouth at bedtime. 90 tablet 1   Continuous Glucose Receiver (FREESTYLE LIBRE 3 READER) DEVI 1 each by Does not apply route daily. 1 each 4   Continuous Glucose Sensor (FREESTYLE LIBRE 3 PLUS SENSOR) MISC Change sensor every 15 days. 1 each  3   ONETOUCH ULTRA test strip USE 1 STRIP TO CHECK GLUCOSE THREE TIMES DAILY     RELION PEN NEEDLE 31G/8MM 31G X 8 MM MISC      No current facility-administered medications on file prior to visit.    LABS/IMAGING: No results found for this or any previous visit (from the past 48 hour(s)). No results found.  LIPID PANEL:    Component Value Date/Time   CHOL 145 06/21/2023 0957   TRIG 131 06/21/2023 0957   HDL 30 (L) 06/21/2023 0957   CHOLHDL 4.8 06/21/2023 0957   CHOLHDL 5.7 02/15/2022 0406   VLDL UNABLE TO CALCULATE IF TRIGLYCERIDE OVER 400 mg/dL 81/19/1478 2956   LDLCALC 91 06/21/2023 0957   LDLDIRECT 64 05/25/2022 0844   LDLDIRECT 26.0 02/15/2022 0406    WEIGHTS: Wt Readings from Last 3 Encounters:  09/18/23 207 lb 6.4 oz (94.1 kg)  08/29/23 205 lb 3.2 oz (93.1 kg)  07/17/23 206 lb (93.4 kg)    VITALS: BP (!) 154/76 (BP Location: Left Arm, Patient Position: Sitting, Cuff Size: Normal)   Pulse (!) 48   Ht 5\' 9"  (1.753 m)   Wt 207 lb 6.4 oz (94.1 kg)   SpO2 92%   BMI 30.63 kg/m   EXAM: General appearance: alert and no distress Neck: no carotid bruit, no JVD, and thyroid not enlarged, symmetric, no tenderness/mass/nodules Lungs: clear to auscultation bilaterally Heart: regular rate and rhythm, S1, S2 normal, no murmur, click,  rub or gallop Abdomen: soft, non-tender; bowel sounds normal; no masses,  no organomegaly and midline abdominal scar Extremities: extremities normal, atraumatic, no cyanosis or edema Pulses: 2+ and symmetric Skin: Skin color, texture, turgor normal. No rashes or lesions Neurologic: Grossly normal Psych: Pleasant  EKG: EKG Interpretation Date/Time:  Tuesday September 18 2023 10:15:13 EST Ventricular Rate:  49 PR Interval:  186 QRS Duration:  90 QT Interval:  438 QTC Calculation: 395 R Axis:   79  Text Interpretation: Sinus bradycardia Possible Inferior infarct (cited on or before 06-Feb-2023) When compared with ECG of 07-Feb-2023 05:43, Vent. rate has decreased BY  29 BPM T wave inversion now evident in Lateral leads Confirmed by Zoila Shutter [phone]) on 09/18/2023 10:18:56 AM    ASSESSMENT: Coronary artery disease with prior MI, status post PCI to the mid RCA with a 3.0 x 28 mm vision MultiLink stent (2012) -repeat cardiac catheterization showed an occluded RCA with left-to-right collaterals but otherwise no significant stenosis (01/2023) History of ruptured AAA in 2009 status post open emergent repair Hypertension Dyslipidemia with high triglycerides and recent pancreatitis, negative LP(a) Type 2 diabetes Pancreatic pseudocyst -status post stenting  PLAN: 1.   Clayton Peterson seems to be doing well without recurrent chest pain and no issues with his pancreatic pseudocyst.  Blood pressure was elevated today.  He was taken off of lisinopril possibly due to renal function.  His creatinine in the summer was 1.48.  His blood pressure however remains elevated.  I rechecked it today and it came down somewhat but still elevated 140/78.  I advise restarting the lisinopril 20 mg daily in addition to his amlodipine and metoprolol.  Will repeat metabolic profile in 2 to 4 weeks and he has follow-up with his PCP in December and his blood pressure can be checked at that time.  Plan follow-up with me  in 6 months or sooner as necessary.  Chrystie Nose, MD, [facility], FACP  [place]  [facility]  Medical Director of the Advanced Lipid Disorders &  Cardiovascular Risk Reduction Clinic Diplomate of the American Board of Clinical Lipidology Attending Cardiologist  Direct Dial: [phone]  Fax: [phone]  Website:  [email or web address] Nicely Daviana Haymaker 09/18/2023, 10:19 AM

## 2023-09-30 ENCOUNTER — Other Ambulatory Visit: Payer: Self-pay | Admitting: Family Medicine

## 2023-10-04 LAB — COMPREHENSIVE METABOLIC PANEL
ALT: 11 [IU]/L (ref 0–44)
AST: 12 [IU]/L (ref 0–40)
Albumin: 4.1 g/dL (ref 3.9–4.9)
Alkaline Phosphatase: 82 [IU]/L (ref 44–121)
BUN/Creatinine Ratio: 13 (ref 10–24)
BUN: 19 mg/dL (ref 8–27)
Bilirubin Total: 0.2 mg/dL (ref 0.0–1.2)
CO2: 23 mmol/L (ref 20–29)
Calcium: 9.5 mg/dL (ref 8.6–10.2)
Chloride: 103 mmol/L (ref 96–106)
Creatinine, Ser: 1.43 mg/dL — ABNORMAL HIGH (ref 0.76–1.27)
Globulin, Total: 1.9 g/dL (ref 1.5–4.5)
Glucose: 179 mg/dL — ABNORMAL HIGH (ref 70–99)
Potassium: 4.6 mmol/L (ref 3.5–5.2)
Sodium: 139 mmol/L (ref 134–144)
Total Protein: 6 g/dL (ref 6.0–8.5)
eGFR: 56 mL/min/{1.73_m2} — ABNORMAL LOW (ref >59)

## 2023-10-04 LAB — LIPID PANEL
Chol/HDL Ratio: 5 ratio (ref 0.0–5.0)
Cholesterol, Total: 129 mg/dL (ref 100–199)
HDL: 26 mg/dL — ABNORMAL LOW
LDL Chol Calc (NIH): 66 mg/dL (ref 0–99)
Triglycerides: 224 mg/dL — ABNORMAL HIGH (ref 0–149)
VLDL Cholesterol Cal: 37 mg/dL (ref 5–40)

## 2023-10-04 LAB — TSH: TSH: 2.19 u[IU]/mL (ref 0.450–4.500)

## 2023-10-04 LAB — T4, FREE: Free T4: 1.43 ng/dL (ref 0.82–1.77)

## 2023-10-06 ENCOUNTER — Other Ambulatory Visit: Payer: Self-pay | Admitting: "Endocrinology

## 2023-10-19 NOTE — Patient Instructions (Addendum)
        Great to see you today.  I have refilled the medication(s) we provide.    - Please take medications as prescribed. - Follow up with your primary health provider if any health concerns arises. - If symptoms worsen please contact your primary care provider and/or visit the emergency department.  

## 2023-10-19 NOTE — Progress Notes (Unsigned)
   Established Patient Office Visit   Subjective  Patient ID: Clayton Peterson, male    DOB: 12/19/1960  Age: 62 y.o. MRN: [id number]  No chief complaint on file.   He  has a past medical history of AAA (abdominal aortic aneurysm) (HCC), Coronary artery disease, Diabetes mellitus without complication (HCC), Heart attack (HCC), High cholesterol, and Hypertension.  HPI  ROS    Objective:     There were no vitals taken for this visit. {Vitals History (Optional):23777}  Physical Exam   No results found for any visits on 10/22/23.  The ASCVD Risk score (Arnett DK, et al., 2019) failed to calculate for the following reasons:   Risk score cannot be calculated because patient has a medical history suggesting prior/existing ASCVD    Assessment & Plan:  There are no diagnoses linked to this encounter.  No follow-ups on file.   Cruzita Lederer Newman Nip, FNP

## 2023-10-22 ENCOUNTER — Ambulatory Visit (INDEPENDENT_AMBULATORY_CARE_PROVIDER_SITE_OTHER): Payer: PPO | Admitting: Family Medicine

## 2023-10-22 ENCOUNTER — Encounter: Payer: Self-pay | Admitting: Family Medicine

## 2023-10-22 VITALS — BP 123/72 | HR 49 | Ht 69.0 in | Wt 208.1 lb

## 2023-10-22 DIAGNOSIS — D509 Iron deficiency anemia, unspecified: Secondary | ICD-10-CM | POA: Diagnosis not present

## 2023-10-22 DIAGNOSIS — I10 Essential (primary) hypertension: Secondary | ICD-10-CM | POA: Diagnosis not present

## 2023-10-22 DIAGNOSIS — Z122 Encounter for screening for malignant neoplasm of respiratory organs: Secondary | ICD-10-CM

## 2023-10-22 DIAGNOSIS — N1831 Chronic kidney disease, stage 3a: Secondary | ICD-10-CM

## 2023-10-22 MED ORDER — GABAPENTIN 300 MG PO CAPS: 300.0000 mg | ORAL_CAPSULE | Freq: Two times a day (BID) | ORAL | 3 refills | Status: DC | PRN

## 2023-10-22 NOTE — Assessment & Plan Note (Signed)
Vitals:   10/22/23 0814  BP: 123/72  Blood pressure controlled in today's visit, Labs reviewed with patient Patient reports taking Metoprolol 50 mg 2 times daily and Lisinopril 20 mg once daily Discussed with  patient to monitor their blood pressure regularly and maintain a heart-healthy diet rich in fruits, vegetables, whole grains, and low-fat dairy, while reducing sodium intake to less than 2,300 mg per day. Regular physical activity, such as 30 minutes of moderate exercise most days of the week, will help lower blood pressure and improve overall cardiovascular health. Avoiding smoking, limiting alcohol consumption, and managing stress. Take  prescribed medication, & take it as directed and avoid skipping doses. Seek emergency care if your blood pressure is (over 180/100) or you experience chest pain, shortness of breath, or sudden vision changes.Patient verbalizes understanding regarding plan of care and all questions answered.

## 2023-10-22 NOTE — Assessment & Plan Note (Signed)
Reviewed labs with patient Explained to keep your hypertension controlled, maintain blood pressure reading goals under 130/80, take your daily blood pressure medications. Managing your diabetes with a hemoglobin A1c goal less than 7. Keep your cholesterol under control to prevent further damage to blood vessels. Avoid NSAIDs medications and take tylenol for pain management.Consume a kidney friendly diet which includes Veggies: cauliflower, onions, eggplant, turnips. Low sodium, low to moderate intake of proteins: lean meats (poultry, fish), eggs, unsalted seafood. Avoid fatty foods, limit or avoid smoking and alcohol intake. Maintain an excercise routine to minimum of 150 minuties a week. Follow up in 4 months to recheck labs.

## 2023-10-23 LAB — IRON,TIBC AND FERRITIN PANEL
Ferritin: 37 ng/mL (ref 30–400)
Iron Saturation: 25 % (ref 15–55)
Iron: 82 ug/dL (ref 38–169)
Total Iron Binding Capacity: 334 ug/dL (ref 250–450)
UIBC: 252 ug/dL (ref 111–343)

## 2023-11-29 ENCOUNTER — Other Ambulatory Visit: Payer: Self-pay | Admitting: Family Medicine

## 2023-12-11 ENCOUNTER — Other Ambulatory Visit: Payer: Self-pay | Admitting: Family Medicine

## 2023-12-13 ENCOUNTER — Other Ambulatory Visit: Payer: Self-pay | Admitting: Family Medicine

## 2023-12-13 ENCOUNTER — Telehealth: Payer: Self-pay | Admitting: "Endocrinology

## 2023-12-13 DIAGNOSIS — I10 Essential (primary) hypertension: Secondary | ICD-10-CM

## 2023-12-13 NOTE — Telephone Encounter (Signed)
 Pt did labs in Dec, do they need to be redone?

## 2023-12-28 ENCOUNTER — Ambulatory Visit: Payer: PPO | Admitting: "Endocrinology

## 2023-12-28 ENCOUNTER — Encounter: Payer: Self-pay | Admitting: "Endocrinology

## 2023-12-28 VITALS — BP 118/60 | HR 56 | Ht 69.0 in | Wt 209.4 lb

## 2023-12-28 DIAGNOSIS — E782 Mixed hyperlipidemia: Secondary | ICD-10-CM | POA: Diagnosis not present

## 2023-12-28 DIAGNOSIS — Z794 Long term (current) use of insulin: Secondary | ICD-10-CM | POA: Diagnosis not present

## 2023-12-28 DIAGNOSIS — E1159 Type 2 diabetes mellitus with other circulatory complications: Secondary | ICD-10-CM

## 2023-12-28 DIAGNOSIS — I10 Essential (primary) hypertension: Secondary | ICD-10-CM | POA: Diagnosis not present

## 2023-12-28 DIAGNOSIS — F17200 Nicotine dependence, unspecified, uncomplicated: Secondary | ICD-10-CM | POA: Insufficient documentation

## 2023-12-28 LAB — POCT GLYCOSYLATED HEMOGLOBIN (HGB A1C): HbA1c, POC (controlled diabetic range): 9.5 % — AB (ref 0.0–7.0)

## 2023-12-28 MED ORDER — INSULIN GLARGINE 100 UNIT/ML ~~LOC~~ SOLN: 40.0000 [IU] | Freq: Every day | SUBCUTANEOUS | 1 refills | Status: DC

## 2023-12-28 NOTE — Progress Notes (Signed)
 Endocrinology Consult Note       12/28/2023, 12:38 PM   Subjective:    Patient ID: Clayton Peterson, male    DOB: September 14, 1961.  Clayton Peterson is being seen in consultation for management of currently uncontrolled symptomatic diabetes requested by  Rica Records, FNP.   Past Medical History:  Diagnosis Date   AAA (abdominal aortic aneurysm) (HCC)    Coronary artery disease    Diabetes mellitus without complication (HCC)    Heart attack (HCC)    High cholesterol    Hypertension     Past Surgical History:  Procedure Laterality Date   ABDOMINAL AORTIC ANEURYSM REPAIR     BACK SURGERY     x2   BIOPSY  06/03/2021   Procedure: BIOPSY;  Surgeon: Lanelle Bal, DO;  [facility];  Service: Endoscopy;;   BIOPSY  02/09/2023   Procedure: BIOPSY;  Surgeon: Benancio Deeds, MD;  Location: [facility];  Service: Gastroenterology;;   CARDIAC CATHETERIZATION     stent placed on right side ofo heart   COLONOSCOPY WITH PROPOFOL N/A 06/03/2021   Surgeon: Lanelle Bal, DO; Nonbleeding internal hemorrhoids, 6 mm polyp in sigmoid colon, 2 mm polyp in the ascending colon, congested and erythematous mucosa in the cecum biopsied.  Pathology revealed 2 tubular adenomas, cecal biopsy with polypoid colonic mucosa with ectatic vascular channels with small vascular malformation not ruled out. Repeat in 5 years.   CYST ENTEROSTOMY N/A 02/13/2023   Procedure: CYST GASTROSTOMY;  Surgeon: Meridee Score Netty Starring., MD;  Location: [facility] ENDOSCOPY;  Service: Gastroenterology;  Laterality: N/A;   CYST ENTEROSTOMY  04/02/2023   Procedure: CYST NECROSECTOMY;  Surgeon: Meridee Score Netty Starring., MD;  Location: [facility];  Service: Gastroenterology;;   ESOPHAGOGASTRODUODENOSCOPY N/A 02/11/2023   Procedure: ESOPHAGOGASTRODUODENOSCOPY (EGD);  Surgeon: Jeani Hawking, MD;  Location: [facility] ENDOSCOPY;  Service:  Gastroenterology;  Laterality: N/A;   ESOPHAGOGASTRODUODENOSCOPY (EGD) WITH PROPOFOL N/A 06/03/2021   Surgeon: Lanelle Bal, DO; Gastritis biopsied, nonobstructing, nonbleeding duodenal ulcer with no stigmata of bleeding, NSAID induced etiology.  Pathology with chronic gastritis, negative for H. pylori.   ESOPHAGOGASTRODUODENOSCOPY (EGD) WITH PROPOFOL N/A 02/09/2023   Procedure: ESOPHAGOGASTRODUODENOSCOPY (EGD) WITH PROPOFOL;  Surgeon: Benancio Deeds, MD;  Location: [facility] ENDOSCOPY;  Service: Gastroenterology;  Laterality: N/A;   ESOPHAGOGASTRODUODENOSCOPY (EGD) WITH PROPOFOL N/A 02/13/2023   Procedure: ESOPHAGOGASTRODUODENOSCOPY (EGD) WITH PROPOFOL;  Surgeon: Meridee Score Netty Starring., MD;  Location: [facility] ENDOSCOPY;  Service: Gastroenterology;  Laterality: N/A;   ESOPHAGOGASTRODUODENOSCOPY (EGD) WITH PROPOFOL N/A 02/22/2023   Procedure: ESOPHAGOGASTRODUODENOSCOPY (EGD) WITH PROPOFOL;  Surgeon: Meridee Score Netty Starring., MD;  Location: [facility];  Service: Gastroenterology;  Laterality: N/A;   ESOPHAGOGASTRODUODENOSCOPY (EGD) WITH PROPOFOL N/A 04/02/2023   Procedure: ESOPHAGOGASTRODUODENOSCOPY (EGD) WITH PROPOFOL;  Surgeon: Meridee Score Netty Starring., MD;  Location: [facility];  Service: Gastroenterology;  Laterality: N/A;   EUS Left 02/11/2023   Procedure: UPPER ENDOSCOPIC ULTRASOUND (EUS) LINEAR;  Surgeon: Jeani Hawking, MD;  Location: [facility] ENDOSCOPY;  Service: Gastroenterology;  Laterality: Left;   EUS N/A 02/13/2023   Procedure: UPPER ENDOSCOPIC ULTRASOUND (EUS) LINEAR;  Surgeon: Lemar Lofty., MD;  [facility];  Service: Gastroenterology;  Laterality: N/A;   KNEE SURGERY Left    LEFT HEART CATH AND CORONARY ANGIOGRAPHY N/A 02/07/2023   Procedure: LEFT HEART CATH AND CORONARY ANGIOGRAPHY;  Surgeon: Kathleene Hazel, MD;  [facility] INVASIVE CV LAB;  Service: Cardiovascular;  Laterality: N/A;   PANCREATIC STENT PLACEMENT  02/13/2023   Procedure: PANCREATIC STENT  PLACEMENT;  Surgeon: Meridee Score Netty Starring., MD;  Location: [facility] ENDOSCOPY;  Service: Gastroenterology;;   PANCREATIC STENT PLACEMENT  02/22/2023   Procedure: PANCREATIC STENT PLACEMENT;  Surgeon: Lemar Lofty., MD;  Location: [facility] ENDOSCOPY;  Service: Gastroenterology;;   PARTIAL COLECTOMY     POLYPECTOMY  06/03/2021   Procedure: POLYPECTOMY INTESTINAL;  Surgeon: Lanelle Bal, DO;  [facility];  Service: Endoscopy;;   STENT REMOVAL  02/22/2023   Procedure: STENT REMOVAL;  Surgeon: Lemar Lofty., MD;  Location: [facility] ENDOSCOPY;  Service: Gastroenterology;;   Francine Graven REMOVAL  04/02/2023   Procedure: STENT REMOVAL;  Surgeon: Lemar Lofty., MD;  Location: [facility] ENDOSCOPY;  Service: Gastroenterology;;    Social History   Socioeconomic History   Marital status: Married    Spouse name: Not on file   Number of children: Not on file   Years of education: Not on file   Highest education level: Associate degree: occupational, Scientist, product/process development, or vocational program  Occupational History   Occupation: disabled, former truck driver  Tobacco Use   Smoking status: Every Day    Current packs/day: 1.50    Average packs/day: 1.5 packs/day for 40.0 years (60.0 ttl pk-yrs)    Types: Cigarettes    Passive exposure: Current   Smokeless tobacco: Never   Tobacco comments:    Smokes 0.5 PPD--03/15/2023.  Vaping Use   Vaping status: Never Used  Substance and Sexual Activity   Alcohol use: Yes    Comment: very seldom   Drug use: Never   Sexual activity: Yes  Other Topics Concern   Not on file  Social History Narrative   Not on file   Social Drivers of Health   Financial Resource Strain: Low Risk  (04/18/2023)   Overall Financial Resource Strain (CARDIA)    Difficulty of Paying Living Expenses: Not hard at all  Food Insecurity: No Food Insecurity (04/18/2023)   Hunger Vital Sign    Worried About Running Out of Food in the Last Year: Never true    Ran Out of Food in the  Last Year: Never true  Transportation Needs: No Transportation Needs (04/18/2023)   PRAPARE - Administrator, Civil Service (Medical): No    Lack of Transportation (Non-Medical): No  Physical Activity: Insufficiently Active (04/18/2023)   Exercise Vital Sign    Days of Exercise per Week: 7 days    Minutes of Exercise per Session: 20 min  Stress: No Stress Concern Present (10/18/2023)   [employer] of Occupational Health - Occupational Stress Questionnaire    Feeling of Stress : Not at all  Social Connections: Moderately Isolated (04/18/2023)   Social Connection and Isolation Panel [NHANES]    Frequency of Communication with Friends and Family: More than three times a week    Frequency of Social Gatherings with Friends and Family: More than three times a week    Attends Religious Services: Never    Database administrator or Organizations: No    Attends Banker Meetings: Never    Marital Status: Married    Family History  Problem Relation Age of  Onset   Atrial fibrillation Mother    Alzheimer's disease Mother    Diabetes Brother    Alzheimer's disease Maternal Grandmother    Aneurysm Maternal Grandfather    Pancreatitis Neg Hx    Pancreatic cancer Neg Hx    Colon cancer Neg Hx     Outpatient Encounter Medications as of 12/28/2023  Medication Sig   acetaminophen (TYLENOL) 650 MG CR tablet Take 1,300 mg by mouth every 8 (eight) hours as needed for pain.   aspirin 81 MG EC tablet Take 81 mg by mouth daily.   Cholecalciferol (VITAMIN D3) 125 MCG (5000 UT) TABS Take 1 tablet by mouth daily.   Continuous Glucose Receiver (FREESTYLE LIBRE 3 READER) DEVI 1 each by Does not apply route daily.   Continuous Glucose Sensor (FREESTYLE LIBRE 3 PLUS SENSOR) MISC Change sensor every 15 days.   fenofibrate micronized (LOFIBRA) 200 MG capsule TAKE 1 CAPSULE BY MOUTH ONCE DAILY BEFORE BREAKFAST   gabapentin (NEURONTIN) 300 MG capsule Take 1 capsule (300 mg total) by  mouth 2 (two) times daily as needed.   glipiZIDE (GLUCOTROL XL) 5 MG 24 hr tablet Take 1 tablet by mouth once daily with breakfast   insulin glargine (LANTUS) 100 UNIT/ML injection Inject 0.4 mLs (40 Units total) into the skin at bedtime.   lisinopril (ZESTRIL) 20 MG tablet Take 1 tablet (20 mg total) by mouth daily.   metFORMIN (GLUCOPHAGE) 500 MG tablet Take 1 tablet (500 mg total) by mouth 2 (two) times daily with a meal.   metoprolol tartrate (LOPRESSOR) 50 MG tablet Take 1 tablet by mouth twice daily   nitroGLYCERIN (NITROSTAT) 0.4 MG SL tablet Place 1 tablet (0.4 mg total) under the tongue every 5 (five) minutes as needed for chest pain.   Omega-3 Fatty Acids (OMEGA 3 PO) Take 2,000 mg by mouth 2 (two) times daily.   ONETOUCH ULTRA test strip USE 1 STRIP TO CHECK GLUCOSE THREE TIMES DAILY   pantoprazole (PROTONIX) 40 MG tablet Take 1 tablet (40 mg total) by mouth 2 (two) times daily.   RELION PEN NEEDLE 31G/8MM 31G X 8 MM MISC    rosuvastatin (CRESTOR) 40 MG tablet Take 1 tablet (40 mg total) by mouth at bedtime.   [DISCONTINUED] insulin glargine (LANTUS) 100 UNIT/ML injection Inject 0.2 mLs (20 Units total) into the skin at bedtime. (Patient taking differently: Inject 20-30 Units into the skin at bedtime.)   No facility-administered encounter medications on file as of 12/28/2023.    ALLERGIES: Allergies  Allergen Reactions   Bee Venom Anaphylaxis   Coconut (Cocos Nucifera) Anaphylaxis    VACCINATION STATUS: Immunization History  Administered Date(s) Administered   Influenza Inj Mdck Quad Pf 08/31/2022   Influenza Split 01/09/2017, 09/13/2019   Influenza, Seasonal, Injecte, Preservative Fre 06/21/2023    Diabetes He presents for his follow-up diabetic visit. He has type 2 diabetes mellitus. Onset time: He was diagnosed at approximate age of 65 years. His disease course has been worsening. There are no hypoglycemic associated symptoms. Pertinent negatives for hypoglycemia include  no confusion, headaches, pallor or seizures. Pertinent negatives for diabetes include no chest pain, no fatigue, no polydipsia, no polyphagia, no polyuria and no weakness. There are no hypoglycemic complications. Symptoms are worsening. Diabetic complications include heart disease and nephropathy. Risk factors for coronary artery disease include family history, dyslipidemia, diabetes mellitus, male sex, obesity, sedentary lifestyle and tobacco exposure. Current diabetic treatments: He is currently on an Lantus to 8 units twice daily Jardiance 10 mg  p.o. daily, metformin 1000 p.o. twice daily and   glimepiride 4 mg p.o. twice daily. His weight is fluctuating minimally. He is following a generally unhealthy diet. When asked about meal planning, he reported none. He never participates in exercise. His home blood glucose trend is increasing steadily. His breakfast blood glucose range is generally 180-200 mg/dl. His lunch blood glucose range is generally >200 mg/dl. His dinner blood glucose range is generally >200 mg/dl. His bedtime blood glucose range is generally >200 mg/dl. His overall blood glucose range is >200 mg/dl. (He is still using his CGM.  He did not call clinic for hypoglycemia.  His AGP report shows 18% time range, 56% level 1 hyperglycemia, 26% level 2 hyperglycemia.  His average blood glucose is 222 mg per DL for the most recent 14 days.  His point-of-care A1c is 9.5% increasing from 7.4%.    )  Hyperlipidemia This is a chronic problem. The current episode started more than 1 year ago. Exacerbating diseases include diabetes. Pertinent negatives include no chest pain or myalgias. Current antihyperlipidemic treatment includes statins. Risk factors for coronary artery disease include diabetes mellitus, dyslipidemia, hypertension, male sex and a sedentary lifestyle.     Review of Systems  Constitutional:  Negative for chills, fatigue, fever and unexpected weight change.  HENT:  Negative for dental  problem, mouth sores and trouble swallowing.   Eyes:  Negative for visual disturbance.  Respiratory:  Negative for wheezing.   Cardiovascular:  Negative for chest pain, palpitations and leg swelling.  Gastrointestinal:  Negative for abdominal distention, abdominal pain, constipation, diarrhea, nausea and vomiting.  Endocrine: Negative for polydipsia, polyphagia and polyuria.  Genitourinary:  Negative for dysuria, flank pain, hematuria and urgency.  Musculoskeletal:  Negative for back pain, gait problem, myalgias and neck pain.  Skin:  Negative for pallor, rash and wound.  Neurological:  Negative for seizures, syncope, weakness, numbness and headaches.  Psychiatric/Behavioral:  Negative for confusion and dysphoric mood.     Objective:       12/28/2023   10:23 AM 10/22/2023    8:14 AM 09/18/2023   10:30 AM  Vitals with BMI  Height 5\' 9"  5\' 9"    Weight 209 lbs 6 oz 208 lbs 1 oz   BMI 30.91 30.71   Systolic 118 123 829  Diastolic 60 72 78  Pulse 56 49     BP 118/60   Pulse (!) 56   Ht 5\' 9"  (1.753 m)   Wt 209 lb 6.4 oz (95 kg)   BMI 30.92 kg/m   Wt Readings from Last 3 Encounters:  12/28/23 209 lb 6.4 oz (95 kg)  10/22/23 208 lb 1.3 oz (94.4 kg)  09/18/23 207 lb 6.4 oz (94.1 kg)      CMP ( most recent) CMP     Component Value Date/Time   NA 139 10/03/2023 0933   K 4.6 10/03/2023 0933   CL 103 10/03/2023 0933   CO2 23 10/03/2023 0933   GLUCOSE 179 (H) 10/03/2023 0933   GLUCOSE 127 (H) 02/15/2023 0249   BUN 19 10/03/2023 0933   CREATININE 1.43 (H) 10/03/2023 0933   CALCIUM 9.5 10/03/2023 0933   PROT 6.0 10/03/2023 0933   ALBUMIN 4.1 10/03/2023 0933   AST 12 10/03/2023 0933   ALT 11 10/03/2023 0933   ALKPHOS 82 10/03/2023 0933   BILITOT 0.2 10/03/2023 0933   GFRNONAA 58 (L) 02/15/2023 0249     Diabetic Labs (most recent): Lab Results  Component Value Date  HGBA1C 9.5 (A) 12/28/2023   HGBA1C 7.4 (A) 08/29/2023   HGBA1C 7.5 (H) 04/02/2023     Lipid Panel  ( most recent) Lipid Panel     Component Value Date/Time   CHOL 129 10/03/2023 0933   TRIG 224 (H) 10/03/2023 0933   HDL 26 (L) 10/03/2023 0933   CHOLHDL 5.0 10/03/2023 0933   CHOLHDL 5.7 02/15/2022 0406   VLDL UNABLE TO CALCULATE IF TRIGLYCERIDE OVER 400 mg/dL 16/07/9603 5409   LDLCALC 66 10/03/2023 0933   LDLDIRECT 64 05/25/2022 0844   LDLDIRECT 26.0 02/15/2022 0406   LABVLDL 37 10/03/2023 0933      Lab Results  Component Value Date   TSH 2.190 10/03/2023   TSH 1.380 12/29/2022   FREET4 1.43 10/03/2023   FREET4 1.34 12/29/2022     Assessment & Plan:   1. DM type 2 causing vascular disease (HCC)  - Clayton Peterson has currently uncontrolled symptomatic type 2 DM since  63 years of age.  He is still using his CGM.  He did not call clinic for hypoglycemia.  His AGP report shows 18% time range, 56% level 1 hyperglycemia, 26% level 2 hyperglycemia.  His average blood glucose is 222 mg per DL for the most recent 14 days.  His point-of-care A1c is 9.5% increasing from 7.4%.    -Recent labs reviewed. - I had a long discussion with him about the possible risk factors and  the pathology behind its diabetes and its complications. -his diabetes is complicated by coronary artery disease, CKD, chronic heavy smoking, comorbid hypertension hyperlipidemia, history of pancreatitis and he remains at a high risk for more acute and chronic complications which include CAD, CVA, CKD, retinopathy, and neuropathy. These are all discussed in detail with him.  - I discussed all available options of managing his diabetes including de-escalation of medications. I have counseled him on Food as Medicine by adopting a Whole Food , Plant Predominant  ( WFPP) nutrition as recommended by [employer] of Lifestyle Medicine. Patient is encouraged to switch to  unprocessed or minimally processed  complex starch, adequate protein intake (mainly plant source), minimal liquid fat, plenty of fruits, and  vegetables. -  he is advised to stick to a routine mealtimes to eat 3 complete meals a day and snack only when necessary ( to snack only to correct hypoglycemia BG <70 day time or <100 at night).  - he acknowledges that there is a room for improvement in his food and drink choices. - Further Specific Suggestion is made for him to avoid simple carbohydrates  from his diet including Cakes, Sweet Desserts, Ice Cream, Soda (diet and regular), Sweet Tea, Candies, Chips, Cookies, Store Bought Juices, Alcohol ,  Artificial Sweeteners,  Coffee Creamer, and "Sugar-free" Products. This will help patient to have more stable blood glucose profile and potentially avoid unintended weight gain.   - he will be scheduled with Norm Salt, RDN, CDE for individualized diabetes education.  -In light of his presentation with significantly above target glycemic profile, he will need a higher dose of insulin in order for him to achieve control of diabetes.  If he calls back with hypoglycemia even after adjustment of his basal insulin, he may need prandial insulin.   -In light of his history of pancreatitis which likely led to his diabetes, he has limited options to treat his diabetes. - He is advised to increase his Lantus to 40 units nightly, advised to utilize his CGM continuously.   - he  is encouraged to call clinic for blood glucose levels less than 70 or above 200 mg /dl. -He is advised to continue metformin 500 mg p.o. twice daily.  He could not afford SGLT2 inhibitor.   -He is advised to continue glipizide 5 mg XL p.o. daily at breakfast.    He is not a suitable candidate for GLP-1 receptor agonist due to his problematic history of pancreatitis.  - Specific targets for  A1c;  LDL, HDL,  and Triglycerides were discussed with the patient.  2) Blood Pressure /Hypertension:  His blood pressure is controlled to target.     he is advised to continue his current medications including Lisinopril 20 mg p.o. daily  with breakfast . 3) Lipids/Hyperlipidemia:   Review of his recent lipid panel showed controlled  LDL at  73.  he  is advised to continue Crestor 40 mg p.o. nightly.     Side effects and precautions discussed with him.  4)  Weight/Diet:  Body mass index is 30.92 kg/m.  -     he is a candidate for modest weight loss. I discussed with him the fact that loss of 5 - 10% of his  current body weight will have the most impact on his diabetes management.  The above detailed  ACLM recommendations for nutrition, exercise, sleep, social life, avoidance of risky substances, the need for restorative sleep   information will also detailed on discharge instructions. Patient is on Creon due to previous pancreatitis.  5) Chronic Care/Health Maintenance:  -he  is on ACEI/ARB and Statin medications and  is encouraged to initiate and continue to follow up with Ophthalmology, Dentist,  Podiatrist at least yearly or according to recommendations, and advised to quit smoking. I have recommended yearly flu vaccine and pneumonia vaccine at least every 5 years; moderate intensity exercise for up to 150 minutes weekly; and  sleep for 7- 9 hours a day.  The patient was counseled on the dangers of tobacco use, and was advised to quit.  Reviewed strategies to maximize success, including removing cigarettes and smoking materials from environment.   - he is  advised to maintain close follow up with Del Nigel Berthold, FNP for primary care needs, as well as his other providers for optimal and coordinated care.   I spent  42  minutes in the care of the patient today including review of labs from CMP, Lipids, Thyroid Function, Hematology (current and previous including abstractions from other facilities); face-to-face time discussing  his blood glucose readings/logs, discussing hypoglycemia and hyperglycemia episodes and symptoms, medications doses, his options of short and long term treatment based on the latest standards of  care / guidelines;  discussion about incorporating lifestyle medicine;  and documenting the encounter. Risk reduction counseling performed per USPSTF guidelines to reduce  obesity and cardiovascular risk factors.     Please refer to Patient Instructions for Blood Glucose Monitoring and Insulin/Medications Dosing Guide"  in media tab for additional information. Please  also refer to " Patient Self Inventory" in the Media  tab for reviewed elements of pertinent patient history.  Clayton Peterson participated in the discussions, expressed understanding, and voiced agreement with the above plans.  All questions were answered to his satisfaction. he is encouraged to contact clinic should he have any questions or concerns prior to his return visit.      Follow up plan: - Return in about 4 months (around 04/28/2024) for F/U with Pre-visit Labs, Meter/CGM/Logs, A1c here.  Marquis Lunch,  MD [facility] [address] [place], [place] [address] Phone: [phone]  Fax: [phone]    12/28/2023, 12:38 PM  This note was partially dictated with voice recognition software. Similar sounding words can be transcribed inadequately or may not  be corrected upon review.

## 2023-12-28 NOTE — Patient Instructions (Signed)

## 2024-01-02 ENCOUNTER — Other Ambulatory Visit: Payer: Self-pay | Admitting: Family Medicine

## 2024-01-02 DIAGNOSIS — I10 Essential (primary) hypertension: Secondary | ICD-10-CM

## 2024-01-02 DIAGNOSIS — E119 Type 2 diabetes mellitus without complications: Secondary | ICD-10-CM

## 2024-01-04 ENCOUNTER — Other Ambulatory Visit: Payer: Self-pay | Admitting: "Endocrinology

## 2024-01-07 ENCOUNTER — Other Ambulatory Visit: Payer: Self-pay | Admitting: Family Medicine

## 2024-01-07 DIAGNOSIS — E119 Type 2 diabetes mellitus without complications: Secondary | ICD-10-CM

## 2024-01-08 ENCOUNTER — Other Ambulatory Visit: Payer: Self-pay | Admitting: Family Medicine

## 2024-01-08 NOTE — Telephone Encounter (Signed)
 Copied from CRM [id number]. Topic: Clinical - Medication Refill >> Jan 08, 2024  9:30 AM Marlow Baars wrote: Most Recent Primary Care Visit:  Provider: Rica Records  Department: [facility]  Visit Type: OFFICE VISIT  Date: 10/22/2023  Medication: rosuvastatin (CRESTOR) 40 MG tablet  Has the patient contacted their pharmacy? No   Is this the correct pharmacy for this prescription? Yes If no, delete pharmacy and type the correct one.  This is the patient's preferred pharmacy:  [facility] [address], [place] - [address] [place] [address]  [place] [address] Phone: [phone] Fax: [phone]   Has the prescription been filled recently? No  Is the patient out of the medication? No but he only has 8 days left  Has the patient been seen for an appointment in the last year OR does the patient have an upcoming appointment? Yes  Can we respond through MyChart? Yes  Please assist patient further

## 2024-01-09 MED ORDER — ROSUVASTATIN CALCIUM 40 MG PO TABS: 40.0000 mg | ORAL_TABLET | Freq: Every day | ORAL | 1 refills | Status: DC

## 2024-01-16 ENCOUNTER — Telehealth: Payer: Self-pay | Admitting: "Endocrinology

## 2024-01-16 DIAGNOSIS — E1159 Type 2 diabetes mellitus with other circulatory complications: Secondary | ICD-10-CM

## 2024-01-16 MED ORDER — INSULIN GLARGINE 100 UNIT/ML ~~LOC~~ SOLN: 40.0000 [IU] | Freq: Every day | SUBCUTANEOUS | 0 refills | Status: DC

## 2024-01-16 NOTE — Telephone Encounter (Signed)
 Can you resend prescription for Lantus ,to [facility] [place].  Pt will need tonight as he only has 1/2 of tonights dose.

## 2024-01-16 NOTE — Telephone Encounter (Signed)
 Rx refill for Lantus 40 units at bedtime sent to [facility],

## 2024-01-18 ENCOUNTER — Other Ambulatory Visit: Payer: Self-pay | Admitting: Family Medicine

## 2024-01-18 DIAGNOSIS — E119 Type 2 diabetes mellitus without complications: Secondary | ICD-10-CM

## 2024-01-21 ENCOUNTER — Other Ambulatory Visit: Payer: Self-pay | Admitting: Family Medicine

## 2024-01-21 DIAGNOSIS — E119 Type 2 diabetes mellitus without complications: Secondary | ICD-10-CM

## 2024-01-22 ENCOUNTER — Other Ambulatory Visit: Payer: Self-pay | Admitting: Family Medicine

## 2024-01-22 DIAGNOSIS — E119 Type 2 diabetes mellitus without complications: Secondary | ICD-10-CM

## 2024-01-22 MED ORDER — FREESTYLE LIBRE 3 READER DEVI: 1.0000 | Freq: Every day | 4 refills | Status: AC

## 2024-01-22 NOTE — Telephone Encounter (Signed)
 Copied from CRM [id number]. Topic: Clinical - Medication Refill >> Jan 22, 2024  3:08 PM Gery Pray wrote: Most Recent Primary Care Visit:  Provider: Rica Records  Department: [facility]  Visit Type: OFFICE VISIT  Date: 10/22/2023  Medication: Continuous Glucose Sensor (FREESTYLE LIBRE 3 PLUS SENSOR) MISC, Continuous Glucose Receiver (FREESTYLE LIBRE 3 READER) DEVI [[id number]]  Has the patient contacted their pharmacy? Yes (Agent: If no, request that the patient contact the pharmacy for the refill. If patient does not wish to contact the pharmacy document the reason why and proceed with request.) (Agent: If yes, when and what did the pharmacy advise?) Pharmacy stated they have faxed the prescription over but no response  Is this the correct pharmacy for this prescription? Yes If no, delete pharmacy and type the correct one.  This is the patient's preferred pharmacy:  [facility] [address], [place] - [address] [place] [address] [place] [address] Phone: [phone] Fax: [phone]   Has the prescription been filled recently? No  Is the patient out of the medication? Yes  Has the patient been seen for an appointment in the last year OR does the patient have an upcoming appointment? Yes  Can we respond through MyChart? Yes  Agent: Please be advised that Rx refills may take up to 3 business days. We ask that you follow-up with your pharmacy.

## 2024-01-23 NOTE — Telephone Encounter (Signed)
 Copied from CRM [id number]. Topic: Clinical - Medication Refill >> Jan 22, 2024  3:08 PM Gery Pray wrote: Most Recent Primary Care Visit:  Provider: Rica Records  Department: [facility]  Visit Type: OFFICE VISIT  Date: 10/22/2023  Medication: Continuous Glucose Sensor (FREESTYLE LIBRE 3 PLUS SENSOR) MISC, Continuous Glucose Receiver (FREESTYLE LIBRE 3 READER) DEVI [[id number]]  Has the patient contacted their pharmacy? Yes (Agent: If no, request that the patient contact the pharmacy for the refill. If patient does not wish to contact the pharmacy document the reason why and proceed with request.) (Agent: If yes, when and what did the pharmacy advise?) Pharmacy stated they have faxed the prescription over but no response  Is this the correct pharmacy for this prescription? Yes If no, delete pharmacy and type the correct one.  This is the patient's preferred pharmacy:  [facility] [address], [place] - [address] [place] [address] [place] [address] Phone: [phone] Fax: [phone]   Has the prescription been filled recently? No  Is the patient out of the medication? Yes  Has the patient been seen for an appointment in the last year OR does the patient have an upcoming appointment? Yes  Can we respond through MyChart? Yes  Agent: Please be advised that Rx refills may take up to 3 business days. We ask that you follow-up with your pharmacy.

## 2024-01-25 ENCOUNTER — Other Ambulatory Visit: Payer: Self-pay | Admitting: Gastroenterology

## 2024-01-29 ENCOUNTER — Telehealth: Payer: Self-pay | Admitting: Family Medicine

## 2024-01-29 ENCOUNTER — Other Ambulatory Visit: Payer: Self-pay | Admitting: Family Medicine

## 2024-01-29 ENCOUNTER — Other Ambulatory Visit: Payer: Self-pay

## 2024-01-29 ENCOUNTER — Other Ambulatory Visit: Payer: Self-pay | Admitting: Gastroenterology

## 2024-01-29 DIAGNOSIS — E119 Type 2 diabetes mellitus without complications: Secondary | ICD-10-CM

## 2024-01-29 MED ORDER — PANTOPRAZOLE SODIUM 40 MG PO TBEC: 40.0000 mg | DELAYED_RELEASE_TABLET | Freq: Two times a day (BID) | ORAL | 3 refills | Status: DC

## 2024-01-29 NOTE — Telephone Encounter (Unsigned)
 Copied from CRM [id number]. Topic: Clinical - Medical Advice >> Jan 29, 2024 10:32 AM Gery Pray wrote: Reason for CRM: Patient is having issues with his Glucose Sensor. Patient's wife would like a call from FNP Konrad Dolores Polanco at [phone].

## 2024-01-29 NOTE — Telephone Encounter (Signed)
 Copied from CRM [id number]. Topic: Clinical - Medical Advice >> Jan 29, 2024 10:32 AM Gery Pray wrote: Reason for CRM: Patient is having issues with his Glucose Sensor. Patient's wife would like a call from FNP Konrad Dolores Polanco at [phone].

## 2024-01-29 NOTE — Telephone Encounter (Signed)
 Copied from CRM [id number]. Topic: Clinical - Medication Refill >> Jan 29, 2024 10:28 AM Gery Pray wrote: Most Recent Primary Care Visit:  Provider: [facility]  Department: [facility]  Visit Type: OFFICE VISIT  Date: 10/22/2023  Medication: pantoprazole (PROTONIX) 40 MG tablet   Has the patient contacted their pharmacy? Yes (Agent: If no, request that the patient contact the pharmacy for the refill. If patient does not wish to contact the pharmacy document the reason why and proceed with request.) (Agent: If yes, when and what did the pharmacy advise?) Stated to call provider  Is this the correct pharmacy for this prescription? Yes If no, delete pharmacy and type the correct one.  This is the patient's preferred pharmacy:  [facility] [address] [place] - [address] [place] [address] [place] [address] Phone: [phone] Fax: [phone]   Has the prescription been filled recently? No  Is the patient out of the medication? No 5 remaining  Has the patient been seen for an appointment in the last year OR does the patient have an upcoming appointment? Yes  Can we respond through MyChart? Yes  Agent: Please be advised that Rx refills may take up to 3 business days. We ask that you follow-up with your pharmacy.

## 2024-01-30 ENCOUNTER — Other Ambulatory Visit: Payer: Self-pay | Admitting: Family Medicine

## 2024-01-30 DIAGNOSIS — E119 Type 2 diabetes mellitus without complications: Secondary | ICD-10-CM

## 2024-01-31 ENCOUNTER — Other Ambulatory Visit: Payer: Self-pay

## 2024-01-31 ENCOUNTER — Other Ambulatory Visit: Payer: Self-pay | Admitting: Family Medicine

## 2024-01-31 DIAGNOSIS — E119 Type 2 diabetes mellitus without complications: Secondary | ICD-10-CM

## 2024-01-31 DIAGNOSIS — I10 Essential (primary) hypertension: Secondary | ICD-10-CM

## 2024-01-31 MED ORDER — FREESTYLE LIBRE 3 PLUS SENSOR MISC: 3 refills | Status: DC

## 2024-01-31 MED ORDER — FREESTYLE LIBRE 3 PLUS SENSOR MISC: 0 refills | Status: DC

## 2024-01-31 NOTE — Telephone Encounter (Signed)
 Called patient's wife to gather more information in regard to the issues the patient is experiencing with the glucose senor. Called patient's primary number- no answer. Called patient's wife - left voicemail.

## 2024-02-21 ENCOUNTER — Other Ambulatory Visit: Payer: Self-pay | Admitting: "Endocrinology

## 2024-02-21 DIAGNOSIS — E1159 Type 2 diabetes mellitus with other circulatory complications: Secondary | ICD-10-CM

## 2024-02-26 ENCOUNTER — Other Ambulatory Visit: Payer: Self-pay | Admitting: Family Medicine

## 2024-02-26 DIAGNOSIS — I10 Essential (primary) hypertension: Secondary | ICD-10-CM

## 2024-02-29 ENCOUNTER — Encounter: Payer: Self-pay | Admitting: Family Medicine

## 2024-02-29 ENCOUNTER — Ambulatory Visit (INDEPENDENT_AMBULATORY_CARE_PROVIDER_SITE_OTHER): Payer: Self-pay | Admitting: Family Medicine

## 2024-02-29 VITALS — BP 102/61 | HR 45 | Ht 69.0 in | Wt 212.1 lb

## 2024-02-29 DIAGNOSIS — Z794 Long term (current) use of insulin: Secondary | ICD-10-CM

## 2024-02-29 DIAGNOSIS — Z122 Encounter for screening for malignant neoplasm of respiratory organs: Secondary | ICD-10-CM | POA: Diagnosis not present

## 2024-02-29 DIAGNOSIS — E559 Vitamin D deficiency, unspecified: Secondary | ICD-10-CM

## 2024-02-29 DIAGNOSIS — E1169 Type 2 diabetes mellitus with other specified complication: Secondary | ICD-10-CM

## 2024-02-29 DIAGNOSIS — I10 Essential (primary) hypertension: Secondary | ICD-10-CM

## 2024-02-29 DIAGNOSIS — L602 Onychogryphosis: Secondary | ICD-10-CM

## 2024-02-29 MED ORDER — GABAPENTIN 400 MG PO CAPS: 400.0000 mg | ORAL_CAPSULE | Freq: Two times a day (BID) | ORAL | 3 refills | Status: DC

## 2024-02-29 MED ORDER — GLIPIZIDE ER 5 MG PO TB24: 5.0000 mg | ORAL_TABLET | Freq: Every day | ORAL | 0 refills | Status: DC

## 2024-02-29 NOTE — Patient Instructions (Signed)

## 2024-02-29 NOTE — Assessment & Plan Note (Signed)
 Vitals:   02/29/24 0842  BP: 102/61  Blood pressure controlled in today's visit, Patient reports taking Metoprolol  50 mg 2 times daily and Lisinopril  20 mg once daily Labs ordered. Discussed with  patient to monitor their blood pressure regularly and maintain a heart-healthy diet rich in fruits, vegetables, whole grains, and low-fat dairy, while reducing sodium intake to less than 2,300 mg per day. Regular physical activity, such as 30 minutes of moderate exercise most days of the week, will help lower blood pressure and improve overall cardiovascular health. Avoiding smoking, limiting alcohol consumption, and managing stress. Take  prescribed medication, & take it as directed and avoid skipping doses. Seek emergency care if your blood pressure is (over 180/100) or you experience chest pain, shortness of breath, or sudden vision changes.Patient verbalizes understanding regarding plan of care and all questions answered.

## 2024-02-29 NOTE — Progress Notes (Signed)
 Established Patient Office Visit   Subjective  Patient ID: Clayton Peterson, male    DOB: 09-05-1961  Age: 63 y.o. MRN: [id number]  Chief Complaint  Patient presents with   Medical Management of Chronic Issues    5 month f/u     He  has a past medical history of AAA (abdominal aortic aneurysm) (HCC), Coronary artery disease, Diabetes mellitus without complication (HCC), Heart attack (HCC), High cholesterol, and Hypertension.  HPI Patient presents to the clinic for chronic follow up. For the details of today's visit, please refer to assessment and plan.   Review of Systems  Constitutional:  Negative for chills and fever.  Respiratory:  Negative for shortness of breath.   Cardiovascular:  Negative for chest pain.  Genitourinary:  Negative for dysuria.  Neurological:  Negative for dizziness and headaches.      Objective:     BP 102/61   Pulse (!) 45   Ht 5\' 9"  (1.753 m)   Wt 212 lb 1.3 oz (96.2 kg)   SpO2 96%   BMI 31.32 kg/m  BP Readings from Last 3 Encounters:  02/29/24 102/61  12/28/23 118/60  10/22/23 123/72      Physical Exam Vitals reviewed.  Constitutional:      General: He is not in acute distress.    Appearance: Normal appearance. He is not ill-appearing, toxic-appearing or diaphoretic.  HENT:     Head: Normocephalic.  Eyes:     General:        Right eye: No discharge.        Left eye: No discharge.     Conjunctiva/sclera: Conjunctivae normal.  Cardiovascular:     Rate and Rhythm: Normal rate.     Pulses: Normal pulses.     Heart sounds: Normal heart sounds.  Pulmonary:     Effort: Pulmonary effort is normal. No respiratory distress.     Breath sounds: Normal breath sounds.  Abdominal:     General: Bowel sounds are normal.     Palpations: Abdomen is soft.     Tenderness: There is no abdominal tenderness. There is no right CVA tenderness, left CVA tenderness or guarding.  Skin:    General: Skin is warm and dry.     Capillary Refill: Capillary  refill takes less than 2 seconds.  Neurological:     Mental Status: He is alert.     Coordination: Coordination normal.     Gait: Gait normal.  Psychiatric:        Mood and Affect: Mood normal.        Behavior: Behavior normal.      No results found for any visits on 02/29/24.  The ASCVD Risk score (Arnett DK, et al., 2019) failed to calculate for the following reasons:   Risk score cannot be calculated because patient has a medical history suggesting prior/existing ASCVD    Assessment & Plan:  Primary hypertension -     CMP14+EGFR -     Lipid panel -     CBC with Differential/Platelet  Vitamin D  deficiency -     VITAMIN D  25 Hydroxy (Vit-D Deficiency, Fractures)  Type 2 diabetes mellitus with other specified complication, with long-term current use of insulin  (HCC) -     Ambulatory referral to Ophthalmology  Screening for lung cancer -     Ambulatory Referral for Lung Cancer Scre  Onychogryphosis -     Ambulatory referral to Podiatry  Essential hypertension, benign Assessment & Plan: Vitals:  02/29/24 0842  BP: 102/61  Blood pressure controlled in today's visit, Patient reports taking Metoprolol  50 mg 2 times daily and Lisinopril  20 mg once daily Labs ordered. Discussed with  patient to monitor their blood pressure regularly and maintain a heart-healthy diet rich in fruits, vegetables, whole grains, and low-fat dairy, while reducing sodium intake to less than 2,300 mg per day. Regular physical activity, such as 30 minutes of moderate exercise most days of the week, will help lower blood pressure and improve overall cardiovascular health. Avoiding smoking, limiting alcohol consumption, and managing stress. Take  prescribed medication, & take it as directed and avoid skipping doses. Seek emergency care if your blood pressure is (over 180/100) or you experience chest pain, shortness of breath, or sudden vision changes.Patient verbalizes understanding regarding plan of care  and all questions answered.    Other orders -     Gabapentin ; Take 1 capsule (400 mg total) by mouth 2 (two) times daily.  Dispense: 60 capsule; Refill: 3 -     glipiZIDE  ER; Take 1 tablet (5 mg total) by mouth daily with breakfast.  Dispense: 90 tablet; Refill: 0    Return in about 6 months (around 08/31/2024), or if symptoms worsen or fail to improve, for hypertension, hyperlipidemia.   Avelino Lek Amber Bail, FNP

## 2024-03-01 LAB — LIPID PANEL
Chol/HDL Ratio: 5.7 ratio — ABNORMAL HIGH (ref 0.0–5.0)
Cholesterol, Total: 147 mg/dL (ref 100–199)
HDL: 26 mg/dL — ABNORMAL LOW (ref >39)
LDL Chol Calc (NIH): 74 mg/dL (ref 0–99)
Triglycerides: 291 mg/dL — ABNORMAL HIGH (ref 0–149)
VLDL Cholesterol Cal: 47 mg/dL — ABNORMAL HIGH (ref 5–40)

## 2024-03-01 LAB — CMP14+EGFR
ALT: 14 IU/L (ref 0–44)
AST: 10 IU/L (ref 0–40)
Albumin: 4.2 g/dL (ref 3.9–4.9)
Alkaline Phosphatase: 65 IU/L (ref 44–121)
BUN/Creatinine Ratio: 16 (ref 10–24)
BUN: 26 mg/dL (ref 8–27)
Bilirubin Total: 0.3 mg/dL (ref 0.0–1.2)
CO2: 18 mmol/L — ABNORMAL LOW (ref 20–29)
Calcium: 9.6 mg/dL (ref 8.6–10.2)
Chloride: 102 mmol/L (ref 96–106)
Creatinine, Ser: 1.66 mg/dL — ABNORMAL HIGH (ref 0.76–1.27)
Globulin, Total: 2 g/dL (ref 1.5–4.5)
Glucose: 168 mg/dL — ABNORMAL HIGH (ref 70–99)
Potassium: 5.2 mmol/L (ref 3.5–5.2)
Sodium: 137 mmol/L (ref 134–144)
Total Protein: 6.2 g/dL (ref 6.0–8.5)
eGFR: 46 mL/min/1.73 — ABNORMAL LOW

## 2024-03-01 LAB — CBC WITH DIFFERENTIAL/PLATELET
Basophils Absolute: 0 10*3/uL (ref 0.0–0.2)
Basos: 0 %
EOS (ABSOLUTE): 0.3 10*3/uL (ref 0.0–0.4)
Eos: 3 %
Hematocrit: 48.6 % (ref 37.5–51.0)
Hemoglobin: 15.7 g/dL (ref 13.0–17.7)
Immature Grans (Abs): 0 10*3/uL (ref 0.0–0.1)
Immature Granulocytes: 0 %
Lymphocytes Absolute: 1.7 10*3/uL (ref 0.7–3.1)
Lymphs: 20 %
MCH: 29.2 pg (ref 26.6–33.0)
MCHC: 32.3 g/dL (ref 31.5–35.7)
MCV: 90 fL (ref 79–97)
Monocytes Absolute: 0.8 10*3/uL (ref 0.1–0.9)
Monocytes: 9 %
Neutrophils Absolute: 6.1 10*3/uL (ref 1.4–7.0)
Neutrophils: 68 %
Platelets: 134 10*3/uL — ABNORMAL LOW (ref 150–450)
RBC: 5.38 x10E6/uL (ref 4.14–5.80)
RDW: 14.2 % (ref 11.6–15.4)
WBC: 8.9 10*3/uL (ref 3.4–10.8)

## 2024-03-01 LAB — VITAMIN D 25 HYDROXY (VIT D DEFICIENCY, FRACTURES): Vit D, 25-Hydroxy: 32.6 ng/mL (ref 30.0–100.0)

## 2024-03-04 ENCOUNTER — Other Ambulatory Visit: Payer: Self-pay | Admitting: Family Medicine

## 2024-03-04 ENCOUNTER — Ambulatory Visit: Payer: Self-pay | Admitting: Family Medicine

## 2024-03-04 MED ORDER — FENOFIBRATE MICRONIZED 200 MG PO CAPS: 200.0000 mg | ORAL_CAPSULE | Freq: Every day | ORAL | 1 refills | Status: DC

## 2024-03-23 ENCOUNTER — Other Ambulatory Visit: Payer: Self-pay | Admitting: Family Medicine

## 2024-03-23 DIAGNOSIS — I10 Essential (primary) hypertension: Secondary | ICD-10-CM

## 2024-04-03 ENCOUNTER — Encounter: Payer: Self-pay | Admitting: Physician Assistant

## 2024-04-03 ENCOUNTER — Other Ambulatory Visit (HOSPITAL_COMMUNITY): Payer: Self-pay

## 2024-04-03 ENCOUNTER — Ambulatory Visit: Attending: Physician Assistant | Admitting: Cardiology

## 2024-04-03 VITALS — BP 104/50 | HR 47 | Ht 69.0 in | Wt 213.6 lb

## 2024-04-03 DIAGNOSIS — Z9889 Other specified postprocedural states: Secondary | ICD-10-CM

## 2024-04-03 DIAGNOSIS — I2510 Atherosclerotic heart disease of native coronary artery without angina pectoris: Secondary | ICD-10-CM

## 2024-04-03 DIAGNOSIS — E1159 Type 2 diabetes mellitus with other circulatory complications: Secondary | ICD-10-CM

## 2024-04-03 DIAGNOSIS — I10 Essential (primary) hypertension: Secondary | ICD-10-CM

## 2024-04-03 DIAGNOSIS — E782 Mixed hyperlipidemia: Secondary | ICD-10-CM | POA: Diagnosis not present

## 2024-04-03 MED ORDER — METOPROLOL SUCCINATE ER 50 MG PO TB24: 50.0000 mg | ORAL_TABLET | Freq: Every day | ORAL | 3 refills | Status: AC

## 2024-04-03 MED ORDER — ATORVASTATIN CALCIUM 80 MG PO TABS: 80.0000 mg | ORAL_TABLET | Freq: Every day | ORAL | 3 refills | Status: AC

## 2024-04-03 NOTE — Progress Notes (Signed)
 Cardiology Office Note:  .   Date:  04/03/2024  ID:  [name], DOB Apr 28, 1961, MRN [id number] PCP: Rosanna Comment, FNP   HeartCare Providers Cardiologist:  Hazle Lites, MD {  History of Present Illness: .   Clayton Peterson is a 63 y.o. male with history of CAD with PCI to mid RCA 2012, AAA repair 2009, type 2 diabetes, hypertension, CKD      CAD Remote stenting to RCA 2012 Most recent left heart catheterization 01/2023 ISR in the mid RCA stent, felt to be chronic with mid to distal left-to-right collaterals.  Medical management.  Chest pain related to severe pancreatic pseudocyst status post stenting. Echocardiogram 01/2023 preserved by ventricular function with no significant valvular disease.  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     We follow patient for history of CAD with stenting to RCA with chronic occlusion in mid RCA stent but with collateral flow, last LHC 01/2023.  Today patient follows up for routine follow-up.  He is accompanied here with his wife today.  He has been doing very well without any complaints.  Has not had any chest pain since the treatment of his pancreatic pseudocyst.  He is active and likes working on his chicken coop's.  Compliant with all of his medications.    ROS: Denies: Chest pain, shortness of breath, orthopnea, peripheral edema, palpitations, decreased exercise intolerance, fatigue, lightheadedness.   Studies Reviewed: Aaron Aas    EKG Interpretation Date/Time:  Thursday April 03 2024 10:01:00 EDT Ventricular Rate:  47 PR Interval:  192 QRS Duration:  90 QT Interval:  428 QTC Calculation: 378 R Axis:   23  Text Interpretation: Sinus bradycardia When compared with ECG of 18-Sep-2023 10:15,  inferior ST sloping, similar to previous. no significant changes. Confirmed by Morgan Arab [phone]) on 04/03/2024 10:05:08 AM    Risk  Assessment/Calculations:             Physical Exam:   VS:  BP (!) 104/50 (BP Location: Left Arm, Patient Position: Sitting, Cuff Size: Normal)   Pulse (!) 47   Ht 5' 9 (1.753 m)   Wt 213 lb 9.6 oz (96.9 kg)   SpO2 95%   BMI 31.54 kg/m    Wt Readings from Last 3 Encounters:  04/03/24 213 lb 9.6 oz (96.9 kg)  02/29/24 212 lb 1.3 oz (96.2 kg)  12/28/23 209 lb 6.4 oz (95 kg)    GEN: Well nourished, well developed in no acute distress NECK: No JVD; No carotid bruits CARDIAC: RR, no murmurs, rubs, gallops.  Bradycardic RESPIRATORY:  Clear to auscultation without rales, wheezing or rhonchi  ABDOMEN: Soft, non-tender, non-distended EXTREMITIES:  No edema; No deformity   ASSESSMENT AND PLAN: .    CAD PCI to RCA History of remote stenting to RCA 2012. Most recent left heart catheterization 01/2023 ISR in the mid RCA stent, felt to be chronic with collaterals.  Medical management recommended.  Mild disease elsewhere.  Stable disease with no anginal complaints. 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  Blood pressure is well-controlled.  I have asked him to follow-up with vascular surgery as he has routine imaging that is needed later this year.  Pancreatic pseudocyst status post stenting Felt to be the primary driver of his chest pain.  No recurrences based off of imaging  Type 2 diabetes/CKD 9.5% 3 months ago, follows with endocrinology, uncontrolled.  Reportedly unable to afford SGLT2 inhibitor.  I will look into this more as he would benefit greatly from this.  Hypertension Excellent control. Lisinopril  20 mg, with baseline bradycardia with very minimal complaints of dizziness we will transition/decrease from Lopressor  50 mg twice daily to Toprol -XL 50 mg daily for ease of dosing.    Hyperlipidemia LDL was 74 in May 2025.  Our goal should be less than 55. Switching to atorvastatin 80 mg with renal disease, referring to lipid clinic for consideration of PCSK9 inhibitor.  Continue fenofibrate .        Dispo: He lives in [place] and would like to follow-up with our office there in 6 months.  Should be seen by endocrinology/PCP for his diabetes and vascular surgery.  Signed, Burnetta Cart, PA-C

## 2024-04-03 NOTE — Patient Instructions (Signed)
 Medication Instructions:  STOP METOPROLOL  TARTRATE  STOP ROSUVASTATIN   START METOPROLOL  SUCCINATE  50 MG DAILY START ATORVASTATIN 80 MG DAILY *If you need a refill on your cardiac medications before your next appointment, please call your pharmacy*  Lab Work: NO LABS If you have labs (blood work) drawn today and your tests are completely normal, you will receive your results only by: MyChart Message (if you have MyChart) OR A paper copy in the mail If you have any lab test that is abnormal or we need to change your treatment, we will call you to review the results.  Testing/Procedures: NO TESTING  Follow-Up: At [facility], you and your health needs are our priority.  As part of our continuing mission to provide you with exceptional heart care, our providers are all part of one team.  This team includes your primary Cardiologist (physician) and Advanced Practice Providers or APPs (Physician Assistants and Nurse Practitioners) who all work together to provide you with the care you need, when you need it.  Your next appointment:   6 month(s)  Provider:   Dr.Mallipeddi  Other Instructions You have been referred to Lipid Clinic

## 2024-04-04 ENCOUNTER — Other Ambulatory Visit (HOSPITAL_COMMUNITY): Payer: Self-pay

## 2024-04-04 ENCOUNTER — Telehealth: Payer: Self-pay | Admitting: Cardiology

## 2024-04-04 ENCOUNTER — Other Ambulatory Visit: Payer: Self-pay

## 2024-04-04 DIAGNOSIS — Z79899 Other long term (current) drug therapy: Secondary | ICD-10-CM

## 2024-04-04 MED ORDER — DAPAGLIFLOZIN PROPANEDIOL 10 MG PO TABS
10.0000 mg | ORAL_TABLET | Freq: Every day | ORAL | 6 refills | Status: DC
  Filled 2024-04-04: qty 30, 30d supply, fill #0

## 2024-04-04 MED ORDER — DAPAGLIFLOZIN PROPANEDIOL 10 MG PO TABS: 10.0000 mg | ORAL_TABLET | Freq: Every day | ORAL | 3 refills | Status: AC

## 2024-04-04 NOTE — Addendum Note (Signed)
 Addended by: Sherryn Donalds on: 04/04/2024 10:35 AM   Modules accepted: Orders

## 2024-04-04 NOTE — Telephone Encounter (Signed)
 Spoke to patient Morgan Arab PA's advice given.He will start Farxiga 10 mg every morning before breakfast.Bmet to be done in 1 week.

## 2024-04-04 NOTE — Telephone Encounter (Signed)
 This message is to follow-up on yesterday's office visit.  He reported that he was unable to afford [place] previously.  Discussed with pharmacy and he has $0 co-pay for this.  If he is willing please have him start taking Farxiga 10 mg daily.  Repeat BMP 1 week from today.  This is an excellent drug for his renal disease and diabetes.  Highly recommend.

## 2024-04-05 ENCOUNTER — Other Ambulatory Visit: Payer: Self-pay | Admitting: "Endocrinology

## 2024-04-15 LAB — BASIC METABOLIC PANEL WITH GFR
BUN/Creatinine Ratio: 18 (ref 10–24)
BUN: 34 mg/dL — ABNORMAL HIGH (ref 8–27)
CO2: 18 mmol/L — ABNORMAL LOW (ref 20–29)
Calcium: 9.3 mg/dL (ref 8.6–10.2)
Chloride: 101 mmol/L (ref 96–106)
Creatinine, Ser: 1.88 mg/dL — ABNORMAL HIGH (ref 0.76–1.27)
Glucose: 214 mg/dL — ABNORMAL HIGH (ref 70–99)
Potassium: 5 mmol/L (ref 3.5–5.2)
Sodium: 137 mmol/L (ref 134–144)
eGFR: 40 mL/min/{1.73_m2} — ABNORMAL LOW (ref >59)

## 2024-04-19 ENCOUNTER — Other Ambulatory Visit: Payer: Self-pay | Admitting: Family Medicine

## 2024-04-19 DIAGNOSIS — E119 Type 2 diabetes mellitus without complications: Secondary | ICD-10-CM

## 2024-04-21 ENCOUNTER — Ambulatory Visit (INDEPENDENT_AMBULATORY_CARE_PROVIDER_SITE_OTHER): Payer: PPO

## 2024-04-21 VITALS — Ht 69.0 in | Wt 209.0 lb

## 2024-04-21 DIAGNOSIS — Z0000 Encounter for general adult medical examination without abnormal findings: Secondary | ICD-10-CM | POA: Diagnosis not present

## 2024-04-21 DIAGNOSIS — Z599 Problem related to housing and economic circumstances, unspecified: Secondary | ICD-10-CM | POA: Diagnosis not present

## 2024-04-21 DIAGNOSIS — Z79899 Other long term (current) drug therapy: Secondary | ICD-10-CM

## 2024-04-21 DIAGNOSIS — E785 Hyperlipidemia, unspecified: Secondary | ICD-10-CM

## 2024-04-21 DIAGNOSIS — E1159 Type 2 diabetes mellitus with other circulatory complications: Secondary | ICD-10-CM

## 2024-04-21 DIAGNOSIS — G4733 Obstructive sleep apnea (adult) (pediatric): Secondary | ICD-10-CM

## 2024-04-21 DIAGNOSIS — Z794 Long term (current) use of insulin: Secondary | ICD-10-CM

## 2024-04-21 DIAGNOSIS — I714 Abdominal aortic aneurysm, without rupture, unspecified: Secondary | ICD-10-CM

## 2024-04-21 DIAGNOSIS — E119 Type 2 diabetes mellitus without complications: Secondary | ICD-10-CM

## 2024-04-21 DIAGNOSIS — N1831 Chronic kidney disease, stage 3a: Secondary | ICD-10-CM

## 2024-04-21 DIAGNOSIS — I10 Essential (primary) hypertension: Secondary | ICD-10-CM

## 2024-04-21 DIAGNOSIS — Z0100 Encounter for examination of eyes and vision without abnormal findings: Secondary | ICD-10-CM

## 2024-04-21 MED ORDER — BLOOD PRESSURE MONITOR 3 DEVI: 0 refills | Status: AC

## 2024-04-21 NOTE — Patient Instructions (Signed)
 Mr. Clayton Peterson ,  Thank you for taking time out of your busy schedule to complete your Annual Wellness Visit with me. I enjoyed our conversation and look forward to speaking with you again next year. I, as well as your care team,  appreciate your ongoing commitment to your health goals. Please review the following plan we discussed and let me know if I can assist you in the future.  I enjoyed our conversation and look forward to it again next year. Blessing for the upcoming year!!  -Azaryah Oleksy  Your Game plan/ To Do List    Referrals Placed:  [facility].  You can call them to schedule your appointment Address: [address], [place], [place] [address] Phone: [phone]  A referral has been placed for you to check and see what additional resources are available to you.   If you haven't heard from anyone within the next 7 business days, please call them and let them know a referral has been placed for you Phone: [phone]   Follow up Visits:  Next appointment with PCP:  September 01, 2024  Medicare AWV with health advisor:  April 22, 2025 at 12:30 pm video visit    Clinician Recommendations:  Aim for 30 minutes of exercise or brisk walking, 6-8 glasses of water , and 5 servings of fruits and vegetables each day.       This is a list of the screening recommended for you and due dates:  Health Maintenance  Topic Date Due   COVID-19 Vaccine (1) Never done   Eye exam for diabetics  Never done   DTaP/Tdap/Td vaccine (1 - Tdap) Never done   Zoster (Shingles) Vaccine (1 of 2) Never done   Pneumococcal Vaccination (3 of 3 - PCV) 09/25/2020   Screening for Lung Cancer  02/06/2024   Yearly kidney health urinalysis for diabetes  04/01/2024   Flu Shot  05/23/2024   Hemoglobin A1C  06/29/2024   Complete foot exam   02/28/2025   Yearly kidney function blood test for diabetes  04/14/2025   Medicare Annual Wellness Visit  04/21/2025   Colon Cancer Screening   06/03/2026   Hepatitis C Screening  Completed   HIV Screening  Completed   Hepatitis B Vaccine  Aged Out   HPV Vaccine  Aged Out   Meningitis B Vaccine  Aged Out    Advanced directives: (Declined) Advance directive discussed with you today. Even though you declined this today, please call our office should you change your mind, and we can give you the proper paperwork for you to fill out. Advance Care Planning is important because it:  [x]  Makes sure you receive the medical care that is consistent with your values, goals, and preferences  [x]  It provides guidance to your family and loved ones and reduces their decisional burden about whether or not they are making the right decisions based on your wishes.  Follow the link provided in your after visit summary or read over the paperwork we have mailed to you to help you started getting your Advance Directives in place. If you need assistance in completing these, please reach out to us  so that we can help you!

## 2024-04-21 NOTE — Progress Notes (Signed)
 Please attest and cosign this visit due to patients primary care provider not being in the office at the time the visit was completed.   Subjective:   Clayton Peterson is a 63 y.o. who presents for a Medicare Wellness preventive visit.  As a reminder, Annual Wellness Visits don't include a physical exam, and some assessments may be limited, especially if this visit is performed virtually. We may recommend an in-person follow-up visit with your provider if needed.  Visit Complete: Virtual I connected with  THERAN VANDERGRIFT on 04/21/24 by a audio enabled telemedicine application and verified that I am speaking with the correct person using two identifiers.  Patient Location: Home  Provider Location: Home Office  I discussed the limitations of evaluation and management by telemedicine. The patient expressed understanding and agreed to proceed.  Vital Signs: Because this visit was a virtual/telehealth visit, some criteria may be missing or patient reported. Any vitals not documented were not able to be obtained and vitals that have been documented are patient reported.  VideoError- Librarian, academic were attempted between this provider and patient, however failed, due to patient having technical difficulties OR patient did not have access to video capability.  We continued and completed visit with audio only.   Persons Participating in Visit: Patient.  AWV Questionnaire: Yes: Patient Medicare AWV questionnaire was completed by the patient on 04/17/2024; I have confirmed that all information answered by patient is correct and no changes since this date.  Cardiac Risk Factors include: advanced age (>108men, >52 women);diabetes mellitus;dyslipidemia;hypertension;male gender;smoking/ tobacco exposure;obesity (BMI >30kg/m2);sedentary lifestyle     Objective:    Today's Vitals   04/17/24 0734 04/21/24 0945  Weight:  209 lb (94.8 kg)  Height:  5' 9 (1.753 m)   PainSc: 0-No pain    Body mass index is 30.86 kg/m.     04/21/2024    9:47 AM 04/18/2023    9:18 AM 04/02/2023   12:34 PM 02/22/2023   12:29 PM 02/13/2023   10:58 AM 02/09/2023    7:55 AM 02/06/2023    4:53 AM  Advanced Directives  Does Patient Have a Medical Advance Directive? No No No No No No No  Would patient like information on creating a medical advance directive? Yes (MAU/Ambulatory/Procedural Areas - Information given) No - Patient declined No - Patient declined  No - Patient declined No - Patient declined No - Patient declined    Current Medications (verified) Outpatient Encounter Medications as of 04/21/2024  Medication Sig   acetaminophen  (TYLENOL ) 650 MG CR tablet Take 1,300 mg by mouth every 8 (eight) hours as needed for pain.   aspirin  81 MG EC tablet Take 81 mg by mouth daily.   atorvastatin  (LIPITOR) 80 MG tablet Take 1 tablet (80 mg total) by mouth daily.   Cholecalciferol (VITAMIN D3) 125 MCG (5000 UT) TABS Take 1 tablet by mouth daily.   Continuous Glucose Receiver (FREESTYLE LIBRE 3 READER) DEVI 1 each by Does not apply route daily.   Continuous Glucose Sensor (FREESTYLE LIBRE 3 PLUS SENSOR) MISC CHANGE SENSOR EVERY 15 DAYS   dapagliflozin  propanediol (FARXIGA ) 10 MG TABS tablet Take 1 tablet (10 mg total) by mouth daily before breakfast.   fenofibrate  micronized (LOFIBRA) 200 MG capsule Take 1 capsule (200 mg total) by mouth daily before breakfast.   gabapentin  (NEURONTIN ) 400 MG capsule Take 1 capsule (400 mg total) by mouth 2 (two) times daily.   glipiZIDE  (GLUCOTROL  XL) 5 MG 24 hr tablet  Take 1 tablet by mouth once daily with breakfast   insulin  glargine (LANTUS ) 100 UNIT/ML injection Inject 0.4 mLs (40 Units total) into the skin at bedtime.   lisinopril  (ZESTRIL ) 20 MG tablet Take 1 tablet (20 mg total) by mouth daily.   metFORMIN  (GLUCOPHAGE ) 500 MG tablet TAKE 1 TABLET BY MOUTH TWICE DAILY WITH A MEAL   metoprolol  succinate (TOPROL -XL) 50 MG 24 hr tablet Take 1  tablet (50 mg total) by mouth daily. Take with or immediately following a meal.   nitroGLYCERIN  (NITROSTAT ) 0.4 MG SL tablet Place 1 tablet (0.4 mg total) under the tongue every 5 (five) minutes as needed for chest pain.   Omega-3 Fatty Acids (OMEGA 3 PO) Take 2,000 mg by mouth 2 (two) times daily.   ONETOUCH ULTRA test strip USE 1 STRIP TO CHECK GLUCOSE THREE TIMES DAILY   pantoprazole  (PROTONIX ) 40 MG tablet TAKE 1 TABLET BY MOUTH TWICE DAILY BEFORE A MEAL   RELION PEN NEEDLE 31G/8MM 31G X 8 MM MISC    No facility-administered encounter medications on file as of 04/21/2024.    Allergies (verified) Bee venom, Coconut (cocos nucifera), and Coconut oil   History: Past Medical History:  Diagnosis Date   AAA (abdominal aortic aneurysm) (HCC)    Coronary artery disease    Diabetes mellitus without complication (HCC)    Heart attack (HCC)    High cholesterol    Hypertension    Past Surgical History:  Procedure Laterality Date   ABDOMINAL AORTIC ANEURYSM REPAIR     BACK SURGERY     x2   BIOPSY  06/03/2021   Procedure: BIOPSY;  Surgeon: Cindie Carlin POUR, DO;  [facility];  Service: Endoscopy;;   BIOPSY  02/09/2023   Procedure: BIOPSY;  Surgeon: Leigh Elspeth SQUIBB, MD;  Location: [facility];  Service: Gastroenterology;;   CARDIAC CATHETERIZATION     stent placed on right side ofo heart   COLONOSCOPY WITH PROPOFOL  N/A 06/03/2021   Surgeon: Cindie Carlin POUR, DO; Nonbleeding internal hemorrhoids, 6 mm polyp in sigmoid colon, 2 mm polyp in the ascending colon, congested and erythematous mucosa in the cecum biopsied.  Pathology revealed 2 tubular adenomas, cecal biopsy with polypoid colonic mucosa with ectatic vascular channels with small vascular malformation not ruled out. Repeat in 5 years.   CYST ENTEROSTOMY N/A 02/13/2023   Procedure: CYST GASTROSTOMY;  Surgeon: Wilhelmenia Aloha Raddle., MD;  Location[facility] ENDOSCOPY;  Service: Gastroenterology;  Laterality: N/A;   CYST  ENTEROSTOMY  04/02/2023   Procedure: CYST NECROSECTOMY;  Surgeon: Wilhelmenia Aloha Raddle., MD;  Location: [facility] ENDOSCOPY;  Service: Gastroenterology;;   ESOPHAGOGASTRODUODENOSCOPY N/A 02/11/2023   Procedure: ESOPHAGOGASTRODUODENOSCOPY (EGD);  Surgeon: Rollin Dover, MD;  Location: [facility] ENDOSCOPY;  Service: Gastroenterology;  Laterality: N/A;   ESOPHAGOGASTRODUODENOSCOPY (EGD) WITH PROPOFOL  N/A 06/03/2021   Surgeon: Cindie Carlin POUR, DO; Gastritis biopsied, nonobstructing, nonbleeding duodenal ulcer with no stigmata of bleeding, NSAID induced etiology.  Pathology with chronic gastritis, negative for H. pylori.   ESOPHAGOGASTRODUODENOSCOPY (EGD) WITH PROPOFOL  N/A 02/09/2023   Procedure: ESOPHAGOGASTRODUODENOSCOPY (EGD) WITH PROPOFOL ;  Surgeon: Leigh Elspeth SQUIBB, MD;  Location: [facility] ENDOSCOPY;  Service: Gastroenterology;  Laterality: N/A;   ESOPHAGOGASTRODUODENOSCOPY (EGD) WITH PROPOFOL  N/A 02/13/2023   Procedure: ESOPHAGOGASTRODUODENOSCOPY (EGD) WITH PROPOFOL ;  Surgeon: Wilhelmenia Aloha Raddle., MD;  Location: [facility] ENDOSCOPY;  Service: Gastroenterology;  Laterality: N/A;   ESOPHAGOGASTRODUODENOSCOPY (EGD) WITH PROPOFOL  N/A 02/22/2023   Procedure: ESOPHAGOGASTRODUODENOSCOPY (EGD) WITH PROPOFOL ;  Surgeon: Wilhelmenia Aloha Raddle., MD;  Location: [facility] ENDOSCOPY;  Service:  Gastroenterology;  Laterality: N/A;   ESOPHAGOGASTRODUODENOSCOPY (EGD) WITH PROPOFOL  N/A 04/02/2023   Procedure: ESOPHAGOGASTRODUODENOSCOPY (EGD) WITH PROPOFOL ;  Surgeon: Wilhelmenia Aloha Raddle., MD;  Location: [facility] ENDOSCOPY;  Service: Gastroenterology;  Laterality: N/A;   EUS Left 02/11/2023   Procedure: UPPER ENDOSCOPIC ULTRASOUND (EUS) LINEAR;  Surgeon: Rollin Dover, MD;  Location: [facility] ENDOSCOPY;  Service: Gastroenterology;  Laterality: Left;   EUS N/A 02/13/2023   Procedure: UPPER ENDOSCOPIC ULTRASOUND (EUS) LINEAR;  Surgeon: Wilhelmenia Aloha Raddle., MD;  Location: [facility] ENDOSCOPY;  Service: Gastroenterology;  Laterality: N/A;   KNEE SURGERY Left     LEFT HEART CATH AND CORONARY ANGIOGRAPHY N/A 02/07/2023   Procedure: LEFT HEART CATH AND CORONARY ANGIOGRAPHY;  Surgeon: Verlin Lonni BIRCH, MD;  [facility] INVAS[facility] CV LAB;  Service: Cardiovascular;  Laterality: N/A;   PANCREATIC STENT PLACEMENT  02/13/2023   Procedure: PANCREATIC STENT PLACEMENT;  Surgeon: Wilhelmenia Aloha Raddle., MD;  Location: [facility] ENDOSCOPY;  Service: Gastroenterology;;   PANCREATIC STENT PLACEMENT  02/22/2023   Procedure: PANCREATIC STENT PLACEMENT;  Surgeon: Wilhelmenia Aloha Raddle., MD;  Location: [facility] ENDOSCOPY;  Service: Gastroenterology;;   PARTIAL COLECTOMY     POLYPECTOMY  06/03/2021   Procedure: POLYPECTOMY INTESTINAL;  Surgeon: Cindie Carlin POUR, DO;  [facility];  Service: Endoscopy;;   STENT REMOVAL  02/22/2023   Procedure: STENT REMOVAL;  Surgeon: Wilhelmenia Aloha Raddle., MD;  Location: [facility] ENDOSCOPY;  Service: Gastroenterology;;   CLEDA REMOVAL  04/02/2023   Procedure: STENT REMOVAL;  Surgeon: Wilhelmenia Aloha Raddle., MD;  Location: [facility] ENDOSCOPY;  Service: Gastroenterology;;   Family History  Problem Relation Age of Onset   Atrial fibrillation Mother    Alzheimer's disease Mother    Diabetes Brother    Alzheimer's disease Maternal Grandmother    Aneurysm Maternal Grandfather    Pancreatitis Neg Hx    Pancreatic cancer Neg Hx    Colon cancer Neg Hx    Social History   Socioeconomic History   Marital status: Married    Spouse name: Not on file   Number of children: Not on file   Years of education: Not on file   Highest education level: Associate degree: occupational, Scientist, product/process development, or vocational program  Occupational History   Occupation: disabled, former truck driver  Tobacco Use   Smoking status: Every Day    Current packs/day: 1.00    Average packs/day: 1 pack/day for 48.5 years (48.5 ttl pk-yrs)    Types: Cigarettes    Start date: 1977    Passive exposure: Current   Smokeless tobacco: Never   Tobacco comments:    04/03/2024 Patient  smokes a pack daily if bored he smokes more    Smokes 0.5 PPD--03/15/2023.  Vaping Use   Vaping status: Never Used  Substance and Sexual Activity   Alcohol use: Not Currently    Comment: very seldom   Drug use: Never   Sexual activity: Yes  Other Topics Concern   Not on file  Social History Narrative   Not on file   Social Drivers of Health   Financial Resource Strain: Medium Risk (04/21/2024)   Overall Financial Resource Strain (CARDIA)    Difficulty of Paying Living Expenses: Somewhat hard  Food Insecurity: No Food Insecurity (04/21/2024)   Hunger Vital Sign    Worried About Running Out of Food in the Last Year: Never true    Ran Out of Food in the Last Year: Never true  Transportation Needs: No Transportation Needs (04/21/2024)   PRAPARE - Transportation  Lack of Transportation (Medical): No    Lack of Transportation (Non-Medical): No  Physical Activity: Inactive (04/21/2024)   Exercise Vital Sign    Days of Exercise per Week: 0 days    Minutes of Exercise per Session: 0 min  Stress: No Stress Concern Present (04/21/2024)   [employer] of Occupational Health - Occupational Stress Questionnaire    Feeling of Stress: Not at all  Social Connections: Moderately Isolated (04/21/2024)   Social Connection and Isolation Panel    Frequency of Communication with Friends and Family: More than three times a week    Frequency of Social Gatherings with Friends and Family: More than three times a week    Attends Religious Services: Never    Database administrator or Organizations: No    Attends Banker Meetings: Never    Marital Status: Married    Tobacco Counseling Ready to quit: Yes Counseling given: Yes Tobacco comments: 04/03/2024 Patient smokes a pack daily if bored he smokes more Smokes 0.5 PPD--03/15/2023.    Clinical Intake:  Pre-visit preparation completed: Yes  Pain : No/denies pain Pain Score: 0-No pain     BMI - recorded: 30.86 Nutritional  Status: BMI > 30  Obese Nutritional Risks: None Diabetes: Yes CBG done?: No (telehealth visit.) Did pt. bring in CBG monitor from home?: No  Lab Results  Component Value Date   HGBA1C 9.5 (A) 12/28/2023   HGBA1C 7.4 (A) 08/29/2023   HGBA1C 7.5 (H) 04/02/2023     How often do you need to have someone help you when you read instructions, pamphlets, or other written materials from your doctor or pharmacy?: 1 - Never  Interpreter Needed?: No  Information entered by :: Stefano ORN CMA   Activities of Daily Living     04/17/2024    7:34 AM  In your present state of health, do you have any difficulty performing the following activities:  Hearing? 1  Vision? 1  Difficulty concentrating or making decisions? 0  Walking or climbing stairs? 1  Dressing or bathing? 0  Doing errands, shopping? 0  Preparing Food and eating ? N  Using the Toilet? N  In the past six months, have you accidently leaked urine? N  Do you have problems with loss of bowel control? N  Managing your Medications? N  Managing your Finances? N  Housekeeping or managing your Housekeeping? N    Patient Care Team: Del Wilhelmena Falter, Hilario, FNP as PCP - General (Family Medicine) Mona Vinie BROCKS, MD as Consulting Physician (Cardiology) Darroll Anes, DO as Consulting Physician (Optometry) Lenis, Ethelle ORN, MD as Consulting Physician (Endocrinology) Cindie Carlin POUR, DO as Consulting Physician (Gastroenterology)  I have updated your Care Teams any recent Medical Services you may have received from other providers in the past year.     Assessment:   This is a routine wellness examination for [place].  Hearing/Vision screen Hearing Screening - Comments:: Patient denies any hearing difficulties.   Vision Screening - Comments:: Wears rx glasses - up to date with routine eye exams with  Patient sees Dr. Anes Darroll w/ [facility] [place] office.     Goals Addressed             This Visit's Progress     Patient Stated   On track    Patients goes is to maintain his current activity level       Depression Screen     04/21/2024    9:57 AM 02/29/2024  9:10 AM 06/21/2023    8:39 AM 04/18/2023    9:13 AM 04/02/2023    9:00 AM 02/28/2023    8:07 AM 02/21/2023    8:16 AM  PHQ 2/9 Scores  PHQ - 2 Score 0 0 0 0 0 0 0  PHQ- 9 Score 0 4 3 0 3  4    Fall Risk     04/17/2024    7:34 AM 10/22/2023    8:21 AM 06/21/2023    8:38 AM 04/18/2023    9:18 AM 04/16/2023    7:59 AM  Fall Risk   Falls in the past year? 0 0 0 0 0  Number falls in past yr: 0 0  0   Injury with Fall? 0 0  0   Risk for fall due to :  No Fall Risks  No Fall Risks   Follow up Falls evaluation completed;Education provided;Falls prevention discussed Falls evaluation completed  Falls prevention discussed     MEDICARE RISK AT HOME:  Medicare Risk at Home Any stairs in or around the home?: No If so, are there any without handrails?: (Patient-Rptd) No Home free of loose throw rugs in walkways, pet beds, electrical cords, etc?: (Patient-Rptd) Yes Adequate lighting in your home to reduce risk of falls?: (Patient-Rptd) Yes Life alert?: (Patient-Rptd) No Use of a cane, walker or w/c?: (Patient-Rptd) No Grab bars in the bathroom?: (Patient-Rptd) No Shower chair or bench in shower?: (Patient-Rptd) No Elevated toilet seat or a handicapped toilet?: (Patient-Rptd) No  TIMED UP AND GO:  Was the test performed?  No  Cognitive Function: 6CIT completed        04/21/2024    9:54 AM 04/18/2023    9:19 AM  6CIT Screen  What Year? 0 points 0 points  What month? 0 points 0 points  What time? 0 points 0 points  Count back from 20 0 points 0 points  Months in reverse 0 points 0 points  Repeat phrase 0 points 0 points  Total Score 0 points 0 points    Immunizations Immunization History  Administered Date(s) Administered   Influenza Inj Mdck Quad Pf 08/16/2020, 08/10/2021, 08/01/2022, 08/31/2022   Influenza Split 01/09/2017,  09/13/2019   Influenza, Seasonal, Injecte, Preservative Fre 06/21/2023   Influenza-Unspecified 08/01/2022   Pneumococcal Polysaccharide-23 09/17/2012, 09/26/2019    Screening Tests Health Maintenance  Topic Date Due   COVID-19 Vaccine (1) Never done   OPHTHALMOLOGY EXAM  Never done   DTaP/Tdap/Td (1 - Tdap) Never done   Zoster Vaccines- Shingrix (1 of 2) Never done   Pneumococcal Vaccine 59-73 Years old (3 of 3 - PCV) 09/25/2020   Lung Cancer Screening  02/06/2024   Diabetic kidney evaluation - Urine ACR  04/01/2024   INFLUENZA VACCINE  05/23/2024   HEMOGLOBIN A1C  06/29/2024   FOOT EXAM  02/28/2025   Diabetic kidney evaluation - eGFR measurement  04/14/2025   Medicare Annual Wellness (AWV)  04/21/2025   Colonoscopy  06/03/2026   Hepatitis C Screening  Completed   HIV Screening  Completed   Hepatitis B Vaccines  Aged Out   HPV VACCINES  Aged Out   Meningococcal B Vaccine  Aged Out    Health Maintenance  Health Maintenance Due  Topic Date Due   COVID-19 Vaccine (1) Never done   OPHTHALMOLOGY EXAM  Never done   DTaP/Tdap/Td (1 - Tdap) Never done   Zoster Vaccines- Shingrix (1 of 2) Never done   Pneumococcal Vaccine 68-33 Years old (3 of  3 - PCV) 09/25/2020   Lung Cancer Screening  02/06/2024   Diabetic kidney evaluation - Urine ACR  04/01/2024   Health Maintenance Items Addressed: Discussed: Lung cancer screening. Given the cpt code so he can check with his insurance company to check cost, ophthalmology referral placed, CRR referral placed.   Additional Screening:  Vision Screening: Recommended annual ophthalmology exams for early detection of glaucoma and other disorders of the eye. Would you like a referral to an eye doctor? Yes    Dental Screening: Recommended annual dental exams for proper oral hygiene  Community Resource Referral / Chronic Care Management: CRR required this visit?  Yes   CCM required this visit?  No   Plan:    I have personally reviewed  and noted the following in the patient's chart:   Medical and social history Use of alcohol, tobacco or illicit drugs  Current medications and supplements including opioid prescriptions. Patient is not currently taking opioid prescriptions. Functional ability and status Nutritional status Physical activity Advanced directives List of other physicians Hospitalizations, surgeries, and ER visits in previous 12 months Vitals Screenings to include cognitive, depression, and falls Referrals and appointments  In addition, I have reviewed and discussed with patient certain preventive protocols, quality metrics, and best practice recommendations. A written personalized care plan for preventive services as well as general preventive health recommendations were provided to patient.   Allena Pietila, CMA   04/21/2024   After Visit Summary: (Mail) Due to this being a telephonic visit, the after visit summary with patients personalized plan was offered to patient via mail   Notes: Please refer to Routing Comments.

## 2024-04-22 ENCOUNTER — Telehealth: Payer: Self-pay

## 2024-04-22 NOTE — Progress Notes (Signed)
 Complex Care Management Note  Care Guide Note 04/22/2024 Name: Clayton Peterson MRN: [id number] DOB: 1961-07-02  Clayton Peterson is a 63 y.o. year old male who sees Del Wilhelmena Falter, [place], [place] for primary care. I reached out to [employer] by phone today to offer complex care management services.  Clayton Peterson was given information about Complex Care Management services today including:   The Complex Care Management services include support from the care team which includes your Nurse Care Manager, Clinical Social Worker, or Pharmacist.  The Complex Care Management team is here to help remove barriers to the health concerns and goals most important to you. Complex Care Management services are voluntary, and the patient may decline or stop services at any time by request to their care team member.   Complex Care Management Consent Status: Patient agreed to services and verbal consent obtained.   Follow up plan:  Telephone appointment with complex care management team member scheduled for:  05/09/2024  Encounter Outcome:  Patient Scheduled  Jeoffrey Buffalo , RMA     [place]  [facility], [facility] Guide  Direct Dial: [phone]  Website: [email or web address]

## 2024-04-24 ENCOUNTER — Telehealth: Payer: Self-pay

## 2024-04-24 NOTE — Progress Notes (Signed)
 Complex Care Management Note Care Guide Note  04/24/2024 Name: Clayton Peterson MRN: [id number] DOB: [birth year]   Complex Care Management Outreach Attempts: An unsuccessful telephone outreach was attempted today to offer the patient information about available complex care management services.  Follow Up Plan:  Additional outreach attempts will be made to offer the patient complex care management information and services.   Encounter Outcome:  No Answer  [place] Ivins [facility]  [facility] Guide Direct Dial: [phone]  Fax: [phone] Website: [email or web address]

## 2024-04-29 ENCOUNTER — Telehealth: Payer: Self-pay

## 2024-04-29 NOTE — Progress Notes (Signed)
 Complex Care Management Note Care Guide Note  04/29/2024 Name: Clayton Peterson MRN: [id number] DOB: 12-13-1960   Complex Care Management Outreach Attempts: A second unsuccessful outreach was attempted today to offer the patient with information about available complex care management services.  Follow Up Plan:  Additional outreach attempts will be made to offer the patient complex care management information and services.   Encounter Outcome:  No Answer  [place] Strieter [facility]  [facility] Guide Direct Dial: [phone]  Fax: [phone] Website: [email or web address]

## 2024-04-30 ENCOUNTER — Telehealth: Payer: Self-pay

## 2024-04-30 ENCOUNTER — Ambulatory Visit (INDEPENDENT_AMBULATORY_CARE_PROVIDER_SITE_OTHER): Admitting: "Endocrinology

## 2024-04-30 ENCOUNTER — Encounter: Payer: Self-pay | Admitting: "Endocrinology

## 2024-04-30 VITALS — BP 102/50 | HR 56 | Ht 69.0 in | Wt 209.8 lb

## 2024-04-30 DIAGNOSIS — E782 Mixed hyperlipidemia: Secondary | ICD-10-CM

## 2024-04-30 DIAGNOSIS — I10 Essential (primary) hypertension: Secondary | ICD-10-CM

## 2024-04-30 DIAGNOSIS — Z794 Long term (current) use of insulin: Secondary | ICD-10-CM | POA: Diagnosis not present

## 2024-04-30 DIAGNOSIS — E1159 Type 2 diabetes mellitus with other circulatory complications: Secondary | ICD-10-CM | POA: Diagnosis not present

## 2024-04-30 DIAGNOSIS — K760 Fatty (change of) liver, not elsewhere classified: Secondary | ICD-10-CM | POA: Insufficient documentation

## 2024-04-30 DIAGNOSIS — F17200 Nicotine dependence, unspecified, uncomplicated: Secondary | ICD-10-CM

## 2024-04-30 LAB — POCT GLYCOSYLATED HEMOGLOBIN (HGB A1C): HbA1c, POC (controlled diabetic range): 7.7 % — AB (ref 0.0–7.0)

## 2024-04-30 MED ORDER — INSULIN GLARGINE 100 UNIT/ML ~~LOC~~ SOLN: 30.0000 [IU] | Freq: Every day | SUBCUTANEOUS | 1 refills | Status: DC

## 2024-04-30 NOTE — Progress Notes (Signed)
 Endocrinology Consult Note       04/30/2024, 2:24 PM   Subjective:    Patient ID: Clayton Peterson, male    DOB: [birth year].  Clayton Peterson is being seen in consultation for management of currently uncontrolled symptomatic diabetes requested by  Terry Wilhelmena Lloyd Hilario, FNP.   Past Medical History:  Diagnosis Date   AAA (abdominal aortic aneurysm) (HCC)    Coronary artery disease    Diabetes mellitus without complication (HCC)    Heart attack (HCC)    High cholesterol    Hypertension     Past Surgical History:  Procedure Laterality Date   ABDOMINAL AORTIC ANEURYSM REPAIR     BACK SURGERY     x2   BIOPSY  06/03/2021   Procedure: BIOPSY;  Surgeon: Cindie Carlin POUR, DO;  [facility]: [facility];  Service: Endoscopy;;   BIOPSY  02/09/2023   Procedure: BIOPSY;  Surgeon: Leigh Elspeth SQUIBB, MD;  Location: [facility];  Service: Gastroenterology;;   CARDIAC CATHETERIZATION     stent placed on right side ofo heart   COLONOSCOPY WITH PROPOFOL  N/A 06/03/2021   Surgeon: Cindie Carlin POUR, DO; Nonbleeding internal hemorrhoids, 6 mm polyp in sigmoid colon, 2 mm polyp in the ascending colon, congested and erythematous mucosa in the cecum biopsied.  Pathology revealed 2 tubular adenomas, cecal biopsy with polypoid colonic mucosa with ectatic vascular channels with small vascular malformation not ruled out. Repeat in 5 years.   CYST ENTEROSTOMY N/A 02/13/2023   Procedure: CYST GASTROSTOMY;  Surgeon: Wilhelmenia Aloha Raddle., MD;  Location: [facility] ENDOSCOPY;  Service: Gastroenterology;  Laterality: N/A;   CYST ENTEROSTOMY  04/02/2023   Procedure: CYST NECROSECTOMY;  Surgeon: Wilhelmenia Aloha Raddle., MD;  Location: [facility];  Service: Gastroenterology;;   ESOPHAGOGASTRODUODENOSCOPY N/A 02/11/2023   Procedure: ESOPHAGOGASTRODUODENOSCOPY (EGD);  Surgeon: Rollin Dover, MD;  Location: [facility] ENDOSCOPY;  Service:  Gastroenterology;  Laterality: N/A;   ESOPHAGOGASTRODUODENOSCOPY (EGD) WITH PROPOFOL  N/A 06/03/2021   Surgeon: Cindie Carlin POUR, DO; Gastritis biopsied, nonobstructing, nonbleeding duodenal ulcer with no stigmata of bleeding, NSAID induced etiology.  Pathology with chronic gastritis, negative for H. pylori.   ESOPHAGOGASTRODUODENOSCOPY (EGD) WITH PROPOFOL  N/A 02/09/2023   Procedure: ESOPHAGOGASTRODUODENOSCOPY (EGD) WITH PROPOFOL ;  Surgeon: Leigh Elspeth SQUIBB, MD;  Location: [facility] ENDOSCOPY;  Service: Gastroenterology;  Laterality: N/A;   ESOPHAGOGASTRODUODENOSCOPY (EGD) WITH PROPOFOL  N/A 02/13/2023   Procedure: ESOPHAGOGASTRODUODENOSCOPY (EGD) WITH PROPOFOL ;  Surgeon: Wilhelmenia Aloha Raddle., MD;  Location: [facility] ENDOSCOPY;  Service: Gastroenterology;  Laterality: N/A;   ESOPHAGOGASTRODUODENOSCOPY (EGD) WITH PROPOFOL  N/A 02/22/2023   Procedure: ESOPHAGOGASTRODUODENOSCOPY (EGD) WITH PROPOFOL ;  Surgeon: Wilhelmenia Aloha Raddle., MD;  Location: [facility]OSCOPY;  Service: Gastroenterology;  Laterality: N/A;   ESOPHAGOGASTRODUODENOSCOPY (EGD) WITH PROPOFOL  N/A 04/02/2023   Procedure: ESOPHAGOGASTRODUODENOSCOPY (EGD) WITH PROPOFOL ;  Surgeon: Wilhelmenia Aloha Raddle., MD;  Location: [facility]OSCOPY;  Service: Gastroenterology;  Laterality: N/A;   EUS Left 02/11/2023   Procedure: UPPER ENDOSCOPIC ULTRASOUND (EUS) LINEAR;  Surgeon: Rollin Dover, MD;  Location: [facility] ENDOSCOPY;  Service: Gastroenterology;  Laterality: Left;   EUS N/A 02/13/2023   Procedure: UPPER ENDOSCOPIC ULTRASOUND (EUS) LINEAR;  Surgeon: Wilhelmenia Aloha Raddle., MD;  Location:  [facility];  Service: Gastroenterology;  Laterality: N/A;   KNEE SURGERY Left    LEFT HEART CATH AND CORONARY ANGIOGRAPHY N/A 02/07/2023   Procedure: LEFT HEART CATH AND CORONARY ANGIOGRAPHY;  Surgeon: Verlin Lonni BIRCH, MD;  Location: [facility] INVAS[facility];  Service: Cardiovascular;  Laterality: N/A;   PANCREATIC STENT PLACEMENT  02/13/2023   Procedure: PANCREATIC STENT  PLACEMENT;  Surgeon: Wilhelmenia Aloha Raddle., MD;  Location: [facility] ENDOSCOPY;  Service: Gastroenterology;;   PANCREATIC STENT PLACEMENT  02/22/2023   Procedure: PANCREATIC STENT PLACEMENT;  Surgeon: Wilhelmenia Aloha Raddle., MD;  Location: [facility]OSCOPY;  Service: Gastroenterology;;   PARTIAL COLECTOMY     POLYPECTOMY  06/03/2021   Procedure: POLYPECTOMY INTESTINAL;  Surgeon: Cindie Carlin POUR, DO;  [facility];  Service: Endoscopy;;   STENT REMOVAL  02/22/2023   Procedure: STENT REMOVAL;  Surgeon: Wilhelmenia Aloha Raddle., MD;  Location: [facility]OSCOPY;  Service: Gastroenterology;;   CLEDA REMOVAL  04/02/2023   Procedure: STENT REMOVAL;  Surgeon: Wilhelmenia Aloha Raddle., MD;  Location: [facility] ENDOSCOPY;  Service: Gastroenterology;;    Social History   Socioeconomic History   Marital status: Married    Spouse name: Not on file   Number of children: Not on file   Years of education: Not on file   Highest education level: Associate degree: occupational, Scientist, product/process development, or vocational program  Occupational History   Occupation: disabled, former truck driver  Tobacco Use   Smoking status: Every Day    Current packs/day: 1.00    Average packs/day: 1 pack/day for 48.5 years (48.5 ttl pk-yrs)    Types: Cigarettes    Start date: 1977    Passive exposure: Current   Smokeless tobacco: Never   Tobacco comments:    04/03/2024 Patient smokes a pack daily if bored he smokes more    Smokes 0.5 PPD--03/15/2023.  Vaping Use   Vaping status: Never Used  Substance and Sexual Activity   Alcohol use: Not Currently    Comment: very seldom   Drug use: Never   Sexual activity: Yes  Other Topics Concern   Not on file  Social History Narrative   Not on file   Social Drivers of Health   Financial Resource Strain: Medium Risk (04/21/2024)   Overall Financial Resource Strain (CARDIA)    Difficulty of Paying Living Expenses: Somewhat hard  Food Insecurity: No Food Insecurity (04/21/2024)   Hunger Vital Sign     Worried About Running Out of Food in the Last Year: Never true    Ran Out of Food in the Last Year: Never true  Transportation Needs: No Transportation Needs (04/21/2024)   PRAPARE - Administrator, Civil Service (Medical): No    Lack of Transportation (Non-Medical): No  Physical Activity: Inactive (04/21/2024)   Exercise Vital Sign    Days of Exercise per Week: 0 days    Minutes of Exercise per Session: 0 min  Stress: No Stress Concern Present (04/21/2024)   [employer] of Occupational Health - Occupational Stress Questionnaire    Feeling of Stress: Not at all  Social Connections: Moderately Isolated (04/21/2024)   Social Connection and Isolation Panel    Frequency of Communication with Friends and Family: More than three times a week    Frequency of Social Gatherings with Friends and Family: More than three times a week    Attends Religious Services: Never    Database administrator or Organizations: No    Attends Banker Meetings: Never  Marital Status: Married    Family History  Problem Relation Peterson of Onset   Atrial fibrillation Mother    Alzheimer's disease Mother    Diabetes Brother    Alzheimer's disease Maternal Grandmother    Aneurysm Maternal Grandfather    Pancreatitis Neg Hx    Pancreatic cancer Neg Hx    Colon cancer Neg Hx     Outpatient Encounter Medications as of 04/30/2024  Medication Sig   lisinopril  (ZESTRIL ) 20 MG tablet Take 1 tablet (20 mg total) by mouth daily.   acetaminophen  (TYLENOL ) 650 MG CR tablet Take 1,300 mg by mouth every 8 (eight) hours as needed for pain.   aspirin  81 MG EC tablet Take 81 mg by mouth daily.   atorvastatin  (LIPITOR) 80 MG tablet Take 1 tablet (80 mg total) by mouth daily.   Blood Pressure Monitoring (BLOOD PRESSURE MONITOR 3) DEVI Check blood pressure once daily 1 hour after taking blood pressure medication   Cholecalciferol (VITAMIN D3) 125 MCG (5000 UT) TABS Take 1 tablet by mouth daily.    Continuous Glucose Receiver (FREESTYLE LIBRE 3 READER) DEVI 1 each by Does not apply route daily.   Continuous Glucose Sensor (FREESTYLE LIBRE 3 PLUS SENSOR) MISC CHANGE SENSOR EVERY 15 DAYS   dapagliflozin  propanediol (FARXIGA ) 10 MG TABS tablet Take 1 tablet (10 mg total) by mouth daily before breakfast.   fenofibrate  micronized (LOFIBRA) 200 MG capsule Take 1 capsule (200 mg total) by mouth daily before breakfast.   gabapentin  (NEURONTIN ) 400 MG capsule Take 1 capsule (400 mg total) by mouth 2 (two) times daily.   glipiZIDE  (GLUCOTROL  XL) 5 MG 24 hr tablet Take 1 tablet by mouth once daily with breakfast   insulin  glargine (LANTUS ) 100 UNIT/ML injection Inject 0.3 mLs (30 Units total) into the skin at bedtime.   metoprolol  succinate (TOPROL -XL) 50 MG 24 hr tablet Take 1 tablet (50 mg total) by mouth daily. Take with or immediately following a meal.   nitroGLYCERIN  (NITROSTAT ) 0.4 MG SL tablet Place 1 tablet (0.4 mg total) under the tongue every 5 (five) minutes as needed for chest pain.   Omega-3 Fatty Acids (OMEGA 3 PO) Take 2,000 mg by mouth 2 (two) times daily.   ONETOUCH ULTRA test strip USE 1 STRIP TO CHECK GLUCOSE THREE TIMES DAILY   pantoprazole  (PROTONIX ) 40 MG tablet TAKE 1 TABLET BY MOUTH TWICE DAILY BEFORE A MEAL   RELION PEN NEEDLE 31G/8MM 31G X 8 MM MISC    [DISCONTINUED] insulin  glargine (LANTUS ) 100 UNIT/ML injection Inject 0.4 mLs (40 Units total) into the skin at bedtime.   [DISCONTINUED] metFORMIN  (GLUCOPHAGE ) 500 MG tablet TAKE 1 TABLET BY MOUTH TWICE DAILY WITH A MEAL   No facility-administered encounter medications on file as of 04/30/2024.    ALLERGIES: Allergies  Allergen Reactions   Bee Venom Anaphylaxis   Coconut (Cocos Nucifera) Anaphylaxis and Other (See Comments)    coconut allergenic extract   Coconut Oil Anaphylaxis    VACCINATION STATUS: Immunization History  Administered Date(s) Administered   Influenza Inj Mdck Quad Pf 08/16/2020, 08/10/2021,  08/01/2022, 08/31/2022   Influenza Split 01/09/2017, 09/13/2019   Influenza, Seasonal, Injecte, Preservative Fre 06/21/2023   Influenza-Unspecified 08/01/2022   Pneumococcal Polysaccharide-23 09/17/2012, 09/26/2019    Diabetes He presents for his follow-up diabetic visit. He has type 2 diabetes mellitus. Onset time: He was diagnosed at approximate Peterson of 49 years. His disease course has been improving. There are no hypoglycemic associated symptoms. Pertinent negatives for hypoglycemia include no  confusion, headaches, pallor or seizures. Pertinent negatives for diabetes include no chest pain, no fatigue, no polydipsia, no polyphagia, no polyuria and no weakness. There are no hypoglycemic complications. Symptoms are improving. Diabetic complications include heart disease and nephropathy. Risk factors for coronary artery disease include family history, dyslipidemia, diabetes mellitus, male sex, obesity, sedentary lifestyle and tobacco exposure. His weight is stable. He is following a generally unhealthy diet. When asked about meal planning, he reported none. He never participates in exercise. His home blood glucose trend is decreasing steadily. His breakfast blood glucose range is generally 130-140 mg/dl. His lunch blood glucose range is generally 130-140 mg/dl. His dinner blood glucose range is generally 130-140 mg/dl. His bedtime blood glucose range is generally 130-140 mg/dl. His overall blood glucose range is 130-140 mg/dl. (He presents with his CGM device.  His AGP report shows improved glycemic profile with 89% time in range, 10% level 1 hyperglycemia.  He has no hypoglycemia.  His average blood glucose 132 mg per DL for the last 2 weeks.  His point-of-care A1c 7.7% improving from 9.5%.    )  Hyperlipidemia This is a chronic problem. The current episode started more than 1 year ago. Exacerbating diseases include diabetes. Pertinent negatives include no chest pain or myalgias. Current  antihyperlipidemic treatment includes statins. Risk factors for coronary artery disease include diabetes mellitus, dyslipidemia, hypertension, male sex and a sedentary lifestyle.     Objective:       04/30/2024   11:24 AM 04/21/2024    9:45 AM 04/03/2024    9:51 AM  Vitals with BMI  Height 5' 9 5' 9 5' 9  Weight 209 lbs 13 oz 209 lbs 213 lbs 10 oz  BMI 30.97 30.85 31.53  Systolic 102 -- 104  Diastolic 50 -- 50  Pulse 56  47    BP (!) 102/50   Pulse (!) 56   Ht 5' 9 (1.753 m)   Wt 209 lb 12.8 oz (95.2 kg)   BMI 30.98 kg/m   Wt Readings from Last 3 Encounters:  04/30/24 209 lb 12.8 oz (95.2 kg)  04/21/24 209 lb (94.8 kg)  04/03/24 213 lb 9.6 oz (96.9 kg)      CMP ( most recent) CMP     Component Value Date/Time   NA 137 04/14/2024 0841   K 5.0 04/14/2024 0841   CL 101 04/14/2024 0841   CO2 18 (L) 04/14/2024 0841   GLUCOSE 214 (H) 04/14/2024 0841   GLUCOSE 127 (H) 02/15/2023 0249   BUN 34 (H) 04/14/2024 0841   CREATININE 1.88 (H) 04/14/2024 0841   CALCIUM  9.3 04/14/2024 0841   PROT 6.2 02/29/2024 1008   ALBUMIN  4.2 02/29/2024 1008   AST 10 02/29/2024 1008   ALT 14 02/29/2024 1008   ALKPHOS 65 02/29/2024 1008   BILITOT 0.3 02/29/2024 1008   GFRNONAA 58 (L) 02/15/2023 0249     Diabetic Labs (most recent): Lab Results  Component Value Date   HGBA1C 7.7 (A) 04/30/2024   HGBA1C 9.5 (A) 12/28/2023   HGBA1C 7.4 (A) 08/29/2023     Lipid Panel ( most recent) Lipid Panel     Component Value Date/Time   CHOL 147 02/29/2024 1008   TRIG 291 (H) 02/29/2024 1008   HDL 26 (L) 02/29/2024 1008   CHOLHDL 5.7 (H) 02/29/2024 1008   CHOLHDL 5.7 02/15/2022 0406   VLDL UNABLE TO CALCULATE IF TRIGLYCERIDE OVER 400 mg/dL 95/73/7976 9593   LDLCALC 74 02/29/2024 1008   LDLDIRECT 64 05/25/2022  0844   LDLDIRECT 26.0 02/15/2022 0406   LABVLDL 47 (H) 02/29/2024 1008      Lab Results  Component Value Date   TSH 2.190 10/03/2023   TSH 1.380 12/29/2022   FREET4 1.43  10/03/2023   FREET4 1.34 12/29/2022     Assessment & Plan:   1. DM type 2 causing vascular disease (HCC)  - Clayton Peterson has currently uncontrolled symptomatic type 2 DM since  63 years of Peterson.  He presents with his CGM device.  His AGP report shows improved glycemic profile with 89% time in range, 10% level 1 hyperglycemia.  He has no hypoglycemia.  His average blood glucose 132 mg per DL for the last 2 weeks.  His point-of-care A1c 7.7% improving from 9.5%.    -Recent labs reviewed. - I had a long discussion with him about the possible risk factors and  the pathology behind its diabetes and its complications. -his diabetes is complicated by coronary artery disease, CKD, chronic heavy smoking, comorbid hypertension hyperlipidemia, history of pancreatitis and he remains at a high risk for more acute and chronic complications which include CAD, CVA, CKD, retinopathy, and neuropathy. These are all discussed in detail with him.  - I discussed all available options of managing his diabetes including de-escalation of medications. I have counseled him on Food as Medicine by adopting a Whole Food , Plant Predominant  ( WFPP) nutrition as recommended by [employer] of Lifestyle Medicine. Patient is encouraged to switch to  unprocessed or minimally processed  complex starch, adequate protein intake (mainly plant source), minimal liquid fat, plenty of fruits, and vegetables. -  he is advised to stick to a routine mealtimes to eat 3 complete meals a day and snack only when necessary ( to snack only to correct hypoglycemia BG <70 day time or <100 at night).  - he acknowledges that there is a room for improvement in his food and drink choices. - Further Specific Suggestion is made for him to avoid simple carbohydrates  from his diet including Cakes, Sweet Desserts, Ice Cream, Soda (diet and regular), Sweet Tea, Candies, Chips, Cookies, Store Bought Juices, Alcohol ,  Artificial Sweeteners,  Coffee  Creamer, and Sugar-free Products. This will help patient to have more stable blood glucose profile and potentially avoid unintended weight gain.   - he will be scheduled with Penny Crumpton, RDN, CDE for individualized diabetes education.  -In light of his presentation with tightening glycemic profile, he is advised to lower Lantus  to 30 units nightly.  He would not need prandial insulin  for now.    - Due to declining renal function, he is advised to discontinue metformin  at this time.   - In the interval, he has obtained coverage for Farxiga -advised to continue Farxiga  10 mg p.o. daily at breakfast.  For now, he is advised to continue glipizide  5 mg XL p.o. daily at breakfast. -He is advised to utilize his CGM continuously,  he is encouraged to call clinic for blood glucose levels less than 70 or above 200 mg /dl.  He is not a suitable candidate for GLP-1 receptor agonist due to his problematic history of pancreatitis.   - Specific targets for  A1c;  LDL, HDL,  and Triglycerides were discussed with the patient.  2) Blood Pressure /Hypertension:  -his blood pressure is controlled to target.     he is advised to continue his current medications including Lisinopril  20 mg p.o. daily with breakfast . 3) Lipids/Hyperlipidemia: Patient with significant  MASLD,  review of his recent lipid panel showed controlled  LDL at  74.  he  is advised to continue Crestor  40 mg p.o. nightly.  He will be considered for treatment for Fib-4 before his next visit.   Side effects and precautions discussed with him.  4)  Weight/Diet:  Body mass index is 30.98 kg/m.  -     he is a candidate for modest weight loss. I discussed with him the fact that loss of 5 - 10% of his  current body weight will have the most impact on his diabetes management.  The above detailed  ACLM recommendations for nutrition, exercise, sleep, social life, avoidance of risky substances, the need for restorative sleep   information will also  detailed on discharge instructions.   5) Chronic Care/Health Maintenance:  -he  is on ACEI/ARB and Statin medications and  is encouraged to initiate and continue to follow up with Ophthalmology, Dentist,  Podiatrist at least yearly or according to recommendations, and advised to quit smoking. I have recommended yearly flu vaccine and pneumonia vaccine at least every 5 years; moderate intensity exercise for up to 150 minutes weekly; and  sleep for 7- 9 hours a day. He is advised to maintain adequate hydration due to rising creatinine on background of CKD.  The patient was counseled on the dangers of tobacco use, and was advised to quit.  Reviewed strategies to maximize success, including removing cigarettes and smoking materials from environment.   - he is  advised to maintain close follow up with Del Wilhelmena Lloyd Sola, FNP for primary care needs, as well as his other providers for optimal and coordinated care.    I spent  40  minutes in the care of the patient today including review of labs from CMP, Lipids, Thyroid Function, Hematology (current and previous including abstractions from other facilities); face-to-face time discussing  his blood glucose readings/logs, discussing hypoglycemia and hyperglycemia episodes and symptoms, medications doses, his options of short and long term treatment based on the latest standards of care / guidelines;  discussion about incorporating lifestyle medicine;  and documenting the encounter. Risk reduction counseling performed per USPSTF guidelines to reduce  obesity and cardiovascular risk factors.     Please refer to Patient Instructions for Blood Glucose Monitoring and Insulin /Medications Dosing Guide  in media tab for additional information. Please  also refer to  Patient Self Inventory in the Media  tab for reviewed elements of pertinent patient history.  Wandell Scullion Fung participated in the discussions, expressed understanding, and voiced agreement  with the above plans.  All questions were answered to his satisfaction. he is encouraged to contact clinic should he have any questions or concerns prior to his return visit.       Follow up plan: - Return in about 4 months (around 08/31/2024) for F/U with Pre-visit Labs, Meter/CGM/Logs, A1c here.  Ranny Earl, MD [facility] [address] [place], [place] [address] Phone: [phone]  Fax: [phone]    04/30/2024, 2:24 PM  This note was partially dictated with voice recognition software. Similar sounding words can be transcribed inadequately or may not  be corrected upon review.

## 2024-04-30 NOTE — Progress Notes (Signed)
 Complex Care Management Note Care Guide Note  04/30/2024 Name: Clayton Peterson MRN: [id number] DOB: 03/22/1961   Complex Care Management Outreach Attempts: A third unsuccessful outreach was attempted today to offer the patient with information about available complex care management services.  Follow Up Plan:  No further outreach attempts will be made at this time. We have been unable to contact the patient to offer or enroll patient in complex care management services.  Encounter Outcome:  No Answer  Kaelani Kendrick [facility]  [facility] Guide Direct Dial: [phone]  Fax: [phone] Website: [email or web address]

## 2024-04-30 NOTE — Patient Instructions (Signed)

## 2024-05-09 ENCOUNTER — Other Ambulatory Visit: Payer: Self-pay | Admitting: *Deleted

## 2024-05-09 NOTE — Patient Outreach (Signed)
 Complex Care Management   Visit Note  05/09/2024  Name:  Clayton Peterson MRN: [id number] DOB: 11-24-1960  Situation: Referral received for Complex Care Management related to Diabetes with Complications and HTN I obtained verbal consent from Patient.  Visit completed with patient  on the phone  Background:   Past Medical History:  Diagnosis Date   AAA (abdominal aortic aneurysm) (HCC)    Coronary artery disease    Diabetes mellitus without complication (HCC)    Heart attack (HCC)    High cholesterol    Hypertension     Assessment: Patient Reported Symptoms:  Cognitive Cognitive Status: No symptoms reported Cognitive/Intellectual Conditions Management [RPT]: None reported or documented in medical history or problem list   Health Maintenance Behaviors: Annual physical exam Healing Pattern: Average Health Facilitated by: Rest, Pain control  Neurological Neurological Review of Symptoms: No symptoms reported Neurological Management Strategies: Routine screening Neurological Self-Management Outcome: 4 (good)  HEENT HEENT Symptoms Reported: Tinnitus HEENT Management Strategies: Routine screening HEENT Self-Management Outcome: 3 (uncertain) HEENT Comment: Reports constant ringing in ear for over 20 years    Cardiovascular Cardiovascular Symptoms Reported: No symptoms reported Does patient have uncontrolled Hypertension?: No Cardiovascular Management Strategies: Routine screening, Medication therapy Cardiovascular Self-Management Outcome: 4 (good)  Respiratory Respiratory Symptoms Reported: No symptoms reported Respiratory Self-Management Outcome: 4 (good)  Endocrine Is patient diabetic?: Yes Is patient checking blood sugars at home?: Yes List most recent blood sugar readings, include date and time of day: fbg 115. post prandial at [phone] Endocrine Self-Management Outcome: 3 (uncertain)  Gastrointestinal Gastrointestinal Symptoms Reported: No symptoms reported Gastrointestinal  Self-Management Outcome: 4 (good)    Genitourinary Genitourinary Symptoms Reported: Difficulty initiating stream Genitourinary Self-Management Outcome: 3 (uncertain)  Integumentary Integumentary Symptoms Reported: No symptoms reported Skin Management Strategies: Routine screening Skin Self-Management Outcome: 4 (good)  Musculoskeletal Musculoskelatal Symptoms Reviewed: Back pain Musculoskeletal Management Strategies: Medication therapy, Routine screening Musculoskeletal Self-Management Outcome: 4 (good) Falls in the past year?: No Number of falls in past year: 1 or less Was there an injury with Fall?: No Fall Risk Category Calculator: 0 Patient Fall Risk Level: Low Fall Risk Patient at Risk for Falls Due to: No Fall Risks Fall risk Follow up: Falls evaluation completed  Psychosocial Psychosocial Symptoms Reported: No symptoms reported Behavioral Health Self-Management Outcome: 4 (good) Major Change/Loss/Stressor/Fears (CP): Denies Techniques to Cope with Loss/Stress/Change: Not applicable Quality of Family Relationships: helpful, involved, supportive Do you feel physically threatened by others?: No      05/09/2024    9:47 AM  Depression screen PHQ 2/9  Decreased Interest 0  Down, Depressed, Hopeless 0  PHQ - 2 Score 0    There were no vitals filed for this visit.  Medications Reviewed Today     Reviewed by Bertrum Rosina HERO, RN (Registered Nurse) on 05/09/24 at 0945  Med List Status: <None>   Medication Order Taking? Sig Documenting Provider Last Dose Status Informant  acetaminophen  (TYLENOL ) 650 MG CR tablet [id number] Yes Take 1,300 mg by mouth every 8 (eight) hours as needed for pain. [provider]  Active Spouse/Significant Other  aspirin  81 MG EC tablet [id number] Yes Take 81 mg by mouth daily. [provider]  Active Spouse/Significant Other  atorvastatin  (LIPITOR) 80 MG tablet [id number] Yes Take 1 tablet (80 mg total) by mouth daily. Darryle Thom CROME,  PA-C  Active   Blood Pressure Monitoring (BLOOD PRESSURE MONITOR 3) DEVI [id number] Yes Check blood pressure once daily 1 hour after taking blood pressure medication  Tobie Suzzane POUR, MD  Active   Cholecalciferol (VITAMIN D3) 125 MCG (5000 UT) TABS [id number] Yes Take 1 tablet by mouth daily. [provider]  Active Spouse/Significant Other  Continuous Glucose Receiver (FREESTYLE LIBRE 3 READER) DEVI [id number] Yes 1 each by Does not apply route daily. Del Wilhelmena Falter, Hilario, FNP  Active   Continuous Glucose Sensor (FREESTYLE LIBRE 3 PLUS SENSOR) [place] [id number] Yes CHANGE SENSOR EVERY 15 DAYS Del Wilhelmena Falter, [place], [place]  Active   dapagliflozin  propanediol (FARXIGA ) 10 MG TABS tablet [id number] Yes Take 1 tablet (10 mg total) by mouth daily before breakfast. Darryle Thom CROME, PA-C  Active   fenofibrate  micronized (LOFIBRA) 200 MG capsule [id number] Yes Take 1 capsule (200 mg total) by mouth daily before breakfast. Del Wilhelmena Falter, Hilario, FNP  Active   gabapentin  (NEURONTIN ) 400 MG capsule [id number] Yes Take 1 capsule (400 mg total) by mouth 2 (two) times daily. Del Orbe Polanco, Hilario, FNP  Active   glipiZIDE  (GLUCOTROL  XL) 5 MG 24 hr tablet [id number] Yes Take 1 tablet by mouth once daily with breakfast Nida, Gebreselassie W, MD  Active   insulin  glargine (LANTUS ) 100 UNIT/ML injection [id number] Yes Inject 0.3 mLs (30 Units total) into the skin at bedtime. Nida, Gebreselassie W, MD  Active   lisinopril  (ZESTRIL ) 20 MG tablet [id number] Yes Take 1 tablet (20 mg total) by mouth daily. Mona Vinie BROCKS, MD  Active   metoprolol  succinate (TOPROL -XL) 50 MG 24 hr tablet [id number] Yes Take 1 tablet (50 mg total) by mouth daily. Take with or immediately following a meal. Darryle Thom CROME, PA-C  Active   nitroGLYCERIN  (NITROSTAT ) 0.4 MG SL tablet [id number] Yes Place 1 tablet (0.4 mg total) under the tongue every 5 (five) minutes as needed for chest pain. Del Orbe Polanco, Hilario, FNP  Active  Spouse/Significant Other  Omega-3 Fatty Acids (OMEGA 3 PO) [id number] Yes Take 2,000 mg by mouth 2 (two) times daily. [provider]  Active Spouse/Significant Other  ONETOUCH ULTRA test strip [id number] Yes USE 1 STRIP TO CHECK GLUCOSE THREE TIMES DAILY [provider]  Active   pantoprazole  (PROTONIX ) 40 MG tablet [id number] Yes TAKE 1 TABLET BY MOUTH TWICE DAILY BEFORE A MEAL Carver, Carlin POUR, DO  Active   RELION PEN NEEDLE 31G/8MM 31G X 8 MM MISC [id number] Yes  [provider]  Active Spouse/Significant Other            Recommendation:   Continue Current Plan of Care  Follow Up Plan:   Telephone follow-up in 2 weeks  Rosina Forte, BSN RN [facility], [facility] RN Care Manager Direct Dial: [phone]  Fax: [phone]

## 2024-05-09 NOTE — Patient Instructions (Signed)
 Visit Information  Thank you for taking time to visit with me today. Please don't hesitate to contact me if I can be of assistance to you before our next scheduled appointment.  Our next appointment is by telephone on 05-22-2024 at 9:00 am Please call the care guide team at [phone] if you need to cancel or reschedule your appointment.   Following is a copy of your care plan:   Goals Addressed             This Visit's Progress    VBCI RN Care Plan: DM       Problems:  Chronic Disease Management support and education needs related to DMII  Goal: Over the next 90 days the Patient will attend all scheduled medical appointments: with primary care provider and specialist as evidenced by keeping all scheduled appointments        continue to work with RN Care Manager and/or Social Worker to address care management and care coordination needs related to DMII as evidenced by adherence to care management team scheduled appointments     take all medications exactly as prescribed and will call provider for medication related questions as evidenced by compliance with all medications    verbalize basic understanding of DMII disease process and self health management plan as evidenced by verbal explanation, recognizing/monitoring symptoms, lifestyle modifications  Interventions:   Diabetes Interventions: Assessed patient's understanding of A1c goal: <6.5% Provided education to patient about basic DM disease process Reviewed medications with patient and discussed importance of medication adherence Counseled on importance of regular laboratory monitoring as prescribed Discussed plans with patient for ongoing care management follow up and provided patient with direct contact information for care management team Provided patient with written educational materials related to hypo and hyperglycemia and importance of correct treatment Reviewed scheduled/upcoming provider appointments including:  09-01-2024 with PCP Advised patient, providing education and rationale, to check cbg with Freestyle  and record, calling provider for findings outside established parameters Review of patient status, including review of consultants reports, relevant laboratory and other test results, and medications completed Review of patient status, including review of consultants reports, relevant laboratory and other test results, and medications completed Screening for signs and symptoms of depression related to chronic disease state  Assessed social determinant of health barriers Lab Results  Component Value Date   HGBA1C 7.7 (A) 04/30/2024    Patient Self-Care Activities:  Attend all scheduled provider appointments Attend church or other social activities Call pharmacy for medication refills 3-7 days in advance of running out of medications Call provider office for new concerns or questions  Take medications as prescribed   schedule appointment with eye doctor check blood sugar at prescribed times: before meals and at bedtime check feet daily for cuts, sores or redness fill half of plate with vegetables keep a food diary limit fast food meals to no more than 1 per week manage portion size prepare main meal at home 3 to 5 days each week set a realistic goal switch to low-fat or skim milk switch to sugar-free drinks wear comfortable, cotton socks wear comfortable, well-fitting shoes  Plan:  Telephone follow up appointment with care management team member scheduled for:  05-22-2024 at 9:00 am          VBCI RN Care Plan: HLD       Problems:  Chronic Disease Management support and education needs related to HLD  Goal: Over the next 90 days the Patient will attend all scheduled medical appointments:  with primary care provider and specialist as evidenced by keeping all scheduled appointments        continue to work with RN Care Manager and/or Social Worker to address care management and  care coordination needs related to HLD as evidenced by adherence to care management team scheduled appointments     take all medications exactly as prescribed and will call provider for medication related questions as evidenced by compliance with all medications    verbalize basic understanding of HLD disease process and self health management plan as evidenced by verbal explanation, recognizing/monitoring symptoms, lifestyle changes  Interventions:   Hyperlipidemia Interventions: Medication review performed; medication list updated in electronic medical record.  Provider established cholesterol goals reviewed Counseled on importance of regular laboratory monitoring as prescribed Provided HLD educational materials Reviewed role and benefits of statin for ASCVD risk reduction Discussed strategies to manage statin-induced myalgias Reviewed importance of limiting foods high in cholesterol Reviewed exercise goals and target of 150 minutes per week Screening for signs and symptoms of depression related to chronic disease state Assessed social determinant of health barriers  Lab Results  Component Value Date   CHOL 147 02/29/2024   HDL 26 (L) 02/29/2024   LDLCALC 74 02/29/2024   LDLDIRECT 64 05/25/2022   TRIG 291 (H) 02/29/2024   CHOLHDL 5.7 (H) 02/29/2024     Patient Self-Care Activities:  Call pharmacy for medication refills 3-7 days in advance of running out of medications Call provider office for new concerns or questions  Take medications as prescribed   take all medications exactly as prescribed call doctor with any symptoms you believe are related to your medicine call doctor when you experience any new symptoms develop an exercise routine  Plan:  Telephone follow up appointment with care management team member scheduled for:  05-22-2024 at 9:00 am             Please call the Suicide and Crisis Lifeline: 988 call the [place]  National Suicide Prevention Lifeline:  (240)212-8022 or TTY: (929) 321-4620 TTY 701-494-6925) to talk to a trained counselor call 1-800-273-TALK (toll free, 24 hour hotline) call the [facility]: (646)795-7430 call 911 if you are experiencing a Mental Health or Behavioral Health Crisis or need someone to talk to.  Patient verbalizes understanding of instructions and care plan provided today and agrees to view in MyChart. Active MyChart status and patient understanding of how to access instructions and care plan via MyChart confirmed with patient.     Rosina Forte, BSN RN [facility], [facility] RN Care Manager Direct Dial: [phone]  Fax: [phone]

## 2024-05-13 ENCOUNTER — Telehealth: Payer: Self-pay | Admitting: Acute Care

## 2024-05-13 DIAGNOSIS — Z122 Encounter for screening for malignant neoplasm of respiratory organs: Secondary | ICD-10-CM

## 2024-05-13 DIAGNOSIS — F17210 Nicotine dependence, cigarettes, uncomplicated: Secondary | ICD-10-CM

## 2024-05-13 DIAGNOSIS — Z87891 Personal history of nicotine dependence: Secondary | ICD-10-CM

## 2024-05-13 NOTE — Telephone Encounter (Signed)
 Lung Cancer Screening Narrative/Criteria Questionnaire (Cigarette Smokers Only- No Cigars/Pipes/vapes)   Clayton Peterson   SDMV:05/16/2024 10:00 [name]       15-Sep-1961   LDCT: 05/26/2024 4:30p AP    63 y.o.   Phone: [phone]  Lung Screening Narrative (confirm age 31-77 yrs Medicare / 50-80 yrs Private pay insurance)   Insurance information:HTA   Referring Provider:Dr. Terry Wilhelmena Falter - PCP   This screening involves an initial phone call with a team member from our program. It is called a shared decision making visit. The initial meeting is required by  insurance and Medicare to make sure you understand the program. This appointment takes about 15-20 minutes to complete. You will complete the screening scan at your scheduled date/time.  This scan takes about 5-10 minutes to complete. You can eat and drink normally before and after the scan.  Criteria questions for Lung Cancer Screening:   Are you a current or former smoker? Current Age began smoking: 63yo   If you are a former smoker, what year did you quit smoking? N/A(within 15 yrs)   To calculate your smoking history, I need an accurate estimate of how many packs of cigarettes you smoked per day and for how many years. (Not just the number of PPD you are now smoking)   Years smoking 49 x Packs per day 1.25 = Pack years 61.25   (at least 20 pack yrs)   (Make sure they understand that we need to know how much they have smoked in the past, not just the number of PPD they are smoking now)  Do you have a personal history of cancer?  No    Do you have a family history of cancer? No  Are you coughing up blood?  No  Have you had unexplained weight loss of 15 lbs or more in the last 6 months? No  It looks like you meet all criteria.  When would be a good time for us  to schedule you for this screening?   Additional information: N/A

## 2024-05-15 ENCOUNTER — Encounter: Payer: Self-pay | Admitting: Adult Health

## 2024-05-15 NOTE — Progress Notes (Unsigned)
  Virtual Visit via Telephone Note  I connected with Clayton Peterson , 05/15/24 11:25 AM by a telemedicine application and verified that I am speaking with the correct person using two identifiers.  Location: Patient: home Provider: home   I discussed the limitations of evaluation and management by telemedicine and the availability of in person appointments. The patient expressed understanding and agreed to proceed.   Shared Decision Making Visit Lung Cancer Screening Program [phone])   Eligibility: 63 y.o. Pack Years Smoking History Calculation = 61 pack years  (# packs/per year x # years smoked) Recent History of coughing up blood  no Unexplained weight loss? no ( >Than 15 pounds within the last 6 months ) Prior History Lung / other cancer no (Diagnosis within the last 5 years already requiring surveillance chest CT Scans). Smoking Status Current Smoker  Visit Components: Discussion included one or more decision making aids. YES Discussion included risk/benefits of screening. YES Discussion included potential follow up diagnostic testing for abnormal scans. YES Discussion included meaning and risk of over diagnosis. YES Discussion included meaning and risk of False Positives. YES Discussion included meaning of total radiation exposure. YES  Counseling Included: Importance of adherence to annual lung cancer LDCT screening. YES Impact of comorbidities on ability to participate in the program. YES Ability and willingness to under diagnostic treatment. YES  Smoking Cessation Counseling: Current Smokers:  Discussed importance of smoking cessation. yes Information about tobacco cessation classes and interventions provided to patient. yes Patient provided with ticket for LDCT Scan. yes Symptomatic Patient. NO Diagnosis Code: Tobacco Use Z72.0 Asymptomatic Patient yes  Counseling - 4 minutes of smoking cessation counseling (CT Chest Lung Cancer Screening Low Dose W/O CM)  [id number]  Z12.2-Screening of respiratory organs Z87.891-Personal history of nicotine  dependence   [name] 05/15/24

## 2024-05-15 NOTE — Patient Instructions (Signed)

## 2024-05-16 ENCOUNTER — Ambulatory Visit: Admitting: Adult Health

## 2024-05-16 DIAGNOSIS — F17210 Nicotine dependence, cigarettes, uncomplicated: Secondary | ICD-10-CM

## 2024-05-17 ENCOUNTER — Other Ambulatory Visit: Payer: Self-pay | Admitting: Family Medicine

## 2024-05-17 DIAGNOSIS — E119 Type 2 diabetes mellitus without complications: Secondary | ICD-10-CM

## 2024-05-18 ENCOUNTER — Other Ambulatory Visit: Payer: Self-pay | Admitting: "Endocrinology

## 2024-05-18 DIAGNOSIS — E1159 Type 2 diabetes mellitus with other circulatory complications: Secondary | ICD-10-CM

## 2024-05-22 ENCOUNTER — Other Ambulatory Visit: Payer: Self-pay | Admitting: *Deleted

## 2024-05-22 NOTE — Patient Instructions (Signed)
 Visit Information  Thank you for taking time to visit with me today. Please don't hesitate to contact me if I can be of assistance to you before our next scheduled appointment.  Your next care management appointment is by telephone on 06/20/2024 at 9:30 AM   Telephone follow-up in 1 month  Please call the care guide team at [phone] if you need to cancel, schedule, or reschedule an appointment.   Please call the Suicide and Crisis Lifeline: 988 call the [place]  National Suicide Prevention Lifeline: (424)490-5113 or TTY: 8283588348 TTY 913-179-4981) to talk to a trained counselor call 1-800-273-TALK (toll free, 24 hour hotline) call the [facility]: [phone] call 911 if you are experiencing a Mental Health or Behavioral Health Crisis or need someone to talk to.  Hendricks Her RN, BSN  [place] VBC[facility]-Population Health RN Case Information systems manager [phone]

## 2024-05-22 NOTE — Patient Outreach (Signed)
 Complex Care Management   Visit Note  05/22/2024  Name:  Clayton Peterson MRN: [id number] DOB: 18-May-1961  Situation: Referral received for Complex Care Management related to Diabetes with Complications and Hyperlipidemia I obtained verbal consent from Patient.  Visit completed with patient  on the phone  Background:   Past Medical History:  Diagnosis Date   AAA (abdominal aortic aneurysm) (HCC)    Coronary artery disease    Diabetes mellitus without complication (HCC)    Heart attack (HCC)    High cholesterol    Hypertension     Assessment: Patient Reported Symptoms:  Cognitive Cognitive Status: No symptoms reported Cognitive/Intellectual Conditions Management [RPT]: None reported or documented in medical history or problem list   Health Maintenance Behaviors: Annual physical exam Healing Pattern: Average Health Facilitated by: Pain control, Rest  Neurological Neurological Review of Symptoms: No symptoms reported Neurological Management Strategies: Routine screening Neurological Self-Management Outcome: 4 (good)  HEENT HEENT Symptoms Reported: Tinnitus, Change or loss of hearing (HOH sometimes) HEENT Management Strategies: Routine screening HEENT Self-Management Outcome: 4 (good)    Cardiovascular Cardiovascular Symptoms Reported: No symptoms reported Does patient have uncontrolled Hypertension?: No Cardiovascular Management Strategies: Medication therapy, Routine screening  Respiratory Respiratory Symptoms Reported: No symptoms reported Respiratory Management Strategies: Coping strategies Respiratory Self-Management Outcome: 4 (good)  Endocrine Endocrine Symptoms Reported: Increased thirst, No symptoms reported Is patient diabetic?: Yes Is patient checking blood sugars at home?: Yes List most recent blood sugar readings, include date and time of day: 119 fasting 6:00 am today ; 205 now ate 1 1/2 hours ago Endocrine Self-Management Outcome: 4 (good)  Gastrointestinal  Gastrointestinal Symptoms Reported: No symptoms reported Gastrointestinal Management Strategies: Coping strategies Gastrointestinal Self-Management Outcome: 4 (good)    Genitourinary Genitourinary Symptoms Reported: No symptoms reported Genitourinary Management Strategies: Coping strategies Genitourinary Self-Management Outcome: 4 (good)  Integumentary Integumentary Symptoms Reported: No symptoms reported Skin Management Strategies: Coping strategies Skin Self-Management Outcome: 4 (good)  Musculoskeletal Musculoskelatal Symptoms Reviewed: Difficulty walking (cannot walk long distances) Musculoskeletal Management Strategies: Coping strategies Musculoskeletal Self-Management Outcome: 4 (good) Falls in the past year?: No Number of falls in past year: 1 or less Was there an injury with Fall?: No Fall Risk Category Calculator: 0 Patient Fall Risk Level: Low Fall Risk Patient at Risk for Falls Due to: No Fall Risks Fall risk Follow up: Falls evaluation completed, Education provided  Psychosocial Psychosocial Symptoms Reported: No symptoms reported Behavioral Management Strategies: Coping strategies Behavioral Health Self-Management Outcome: 4 (good) Major Change/Loss/Stressor/Fears (CP): Denies Techniques to Cope with Loss/Stress/Change: Not applicable Quality of Family Relationships: helpful, involved, supportive Do you feel physically threatened by others?: No      05/22/2024    9:26 AM  Depression screen PHQ 2/9  Decreased Interest 0  Down, Depressed, Hopeless 0  PHQ - 2 Score 0    Vitals:   05/22/24 0920  BP: 116/61  Pulse: (!) 59    Medications Reviewed Today     Reviewed by Kay Hendricks KANDICE, RN (Case Manager) on 05/22/24 at 0913  Med List Status: <None>   Medication Order Taking? Sig Documenting Provider Last Dose Status Informant  acetaminophen  (TYLENOL ) 650 MG CR tablet [id number] Yes Take 1,300 mg by mouth every 8 (eight) hours as needed for pain. [provider]  Active Spouse/Significant Other  aspirin  81 MG EC tablet [id number] Yes Take 81 mg by mouth daily. [provider]  Active Spouse/Significant Other  atorvastatin  (LIPITOR) 80 MG tablet [id number] Yes Take 1 tablet (80  mg total) by mouth daily. Darryle Thom CROME, PA-C  Active   Blood Pressure Monitoring (BLOOD PRESSURE MONITOR 3) DEVI [id number] Yes Check blood pressure once daily 1 hour after taking blood pressure medication Tobie Suzzane POUR, MD  Active   Cholecalciferol (VITAMIN D3) 125 MCG (5000 UT) TABS [id number] Yes Take 1 tablet by mouth daily. [provider]  Active Spouse/Significant Other  Continuous Glucose Receiver (FREESTYLE LIBRE 3 READER) DEVI [id number] Yes 1 each by Does not apply route daily. Del Wilhelmena Falter, Hilario, FNP  Active   Continuous Glucose Sensor (FREESTYLE LIBRE 3 PLUS SENSOR) [place] [id number] Yes CHANGE SENSOR EVERY 15 DAYS Del Wilhelmena Falter, [place], FNP  Active   dapagliflozin  propanediol (FARXIGA ) 10 MG TABS tablet [id number] Yes Take 1 tablet (10 mg total) by mouth daily before breakfast. Darryle Thom CROME, PA-C  Active   fenofibrate  micronized (LOFIBRA) 200 MG capsule [id number] Yes Take 1 capsule (200 mg total) by mouth daily before breakfast. Del Wilhelmena Falter, Hilario, FNP  Active   gabapentin  (NEURONTIN ) 400 MG capsule [id number] Yes Take 1 capsule (400 mg total) by mouth 2 (two) times daily. Del Wilhelmena Falter, Hilario, FNP  Active   glipiZIDE  (GLUCOTROL  XL) 5 MG 24 hr tablet [id number] Yes Take 1 tablet by mouth once daily with breakfast Nida, Gebreselassie W, MD  Active   insulin  glargine (LANTUS ) 100 UNIT/ML injection [id number] Yes Inject 0.3 mLs (30 Units total) into the skin at bedtime. Nida, Gebreselassie W, MD  Active   lisinopril  (ZESTRIL ) 20 MG tablet [id number] Yes Take 1 tablet (20 mg total) by mouth daily. Mona Vinie BROCKS, MD  Active   metoprolol  succinate (TOPROL -XL) 50 MG 24 hr tablet [id number] Yes Take 1 tablet (50 mg total) by mouth  daily. Take with or immediately following a meal. Darryle Thom CROME, PA-C  Active   nitroGLYCERIN  (NITROSTAT ) 0.4 MG SL tablet [id number] Yes Place 1 tablet (0.4 mg total) under the tongue every 5 (five) minutes as needed for chest pain. Del Orbe Polanco, Hilario, FNP  Active Spouse/Significant Other  Omega-3 Fatty Acids (OMEGA 3 PO) [id number] Yes Take 2,000 mg by mouth 2 (two) times daily. [provider]  Active Spouse/Significant Other  ONETOUCH ULTRA test strip [id number] Yes USE 1 STRIP TO CHECK GLUCOSE THREE TIMES DAILY [provider]  Active   pantoprazole  (PROTONIX ) 40 MG tablet [id number] Yes TAKE 1 TABLET BY MOUTH TWICE DAILY BEFORE A MEAL Carver, Carlin POUR, DO  Active   RELION PEN NEEDLE 31G/8MM 31G X 8 MM MISC [id number] Yes  [provider]  Active Spouse/Significant Other            Recommendation:   Continue Current Plan of Care  Follow Up Plan:   Telephone follow-up in 1 month  Hendricks Her RN, BSN  [place] [facility] RN Case Manager   Direct [phone]

## 2024-05-26 ENCOUNTER — Ambulatory Visit (HOSPITAL_COMMUNITY)
Admission: RE | Admit: 2024-05-26 | Discharge: 2024-05-26 | Disposition: A | Discharge status: Home / Self Care | Source: Ambulatory Visit | Attending: Acute Care | Admitting: Acute Care

## 2024-05-26 DIAGNOSIS — Z122 Encounter for screening for malignant neoplasm of respiratory organs: Secondary | ICD-10-CM | POA: Diagnosis present

## 2024-05-26 DIAGNOSIS — F17210 Nicotine dependence, cigarettes, uncomplicated: Secondary | ICD-10-CM | POA: Diagnosis present

## 2024-05-26 DIAGNOSIS — Z87891 Personal history of nicotine dependence: Secondary | ICD-10-CM | POA: Diagnosis present

## 2024-05-29 ENCOUNTER — Other Ambulatory Visit: Payer: Self-pay | Admitting: "Endocrinology

## 2024-05-29 DIAGNOSIS — E1159 Type 2 diabetes mellitus with other circulatory complications: Secondary | ICD-10-CM

## 2024-06-10 ENCOUNTER — Other Ambulatory Visit: Payer: Self-pay | Admitting: Family Medicine

## 2024-06-13 ENCOUNTER — Telehealth: Payer: Self-pay | Admitting: Acute Care

## 2024-06-13 NOTE — Telephone Encounter (Signed)
 Read as a LR 2. But the nodule is 9.1 mm. I would recommend a 6 month if the patient is agreeable.  CAD on a statin , had an echo in 2024.  Fax results to PCP and let her know plan. Thanks so much

## 2024-06-16 ENCOUNTER — Other Ambulatory Visit: Payer: Self-pay | Admitting: Family Medicine

## 2024-06-16 DIAGNOSIS — E119 Type 2 diabetes mellitus without complications: Secondary | ICD-10-CM

## 2024-06-16 NOTE — Telephone Encounter (Signed)
 Called and left VM for pt

## 2024-06-17 ENCOUNTER — Other Ambulatory Visit: Payer: Self-pay | Admitting: "Endocrinology

## 2024-06-17 DIAGNOSIS — E1159 Type 2 diabetes mellitus with other circulatory complications: Secondary | ICD-10-CM

## 2024-06-18 ENCOUNTER — Other Ambulatory Visit: Payer: Self-pay

## 2024-06-18 DIAGNOSIS — F17210 Nicotine dependence, cigarettes, uncomplicated: Secondary | ICD-10-CM

## 2024-06-18 DIAGNOSIS — Z87891 Personal history of nicotine dependence: Secondary | ICD-10-CM

## 2024-06-18 DIAGNOSIS — Z122 Encounter for screening for malignant neoplasm of respiratory organs: Secondary | ICD-10-CM

## 2024-06-18 DIAGNOSIS — R911 Solitary pulmonary nodule: Secondary | ICD-10-CM

## 2024-06-18 NOTE — Telephone Encounter (Signed)
 Spoke with patient's wife Ernesto, advises patient has seen the results of his recent LCS. Results reviewed. They are in agreement to complete a 6 month f/u scan due, 11/28/2024. They are aware they will get a call in January to schedule his f/u scan. Order placed. Results and plan to PCP. NFN.

## 2024-06-20 ENCOUNTER — Other Ambulatory Visit: Payer: Self-pay | Admitting: *Deleted

## 2024-06-20 NOTE — Patient Instructions (Signed)
 Visit Information  Thank you for taking time to visit with me today. Please don't hesitate to contact me if I can be of assistance to you before our next scheduled appointment.  Your next care management appointment is by telephone on 07-18-2024 at 9:30 am  Telephone follow-up in 1 month  Please call the care guide team at [phone] if you need to cancel, schedule, or reschedule an appointment.   Please call the Suicide and Crisis Lifeline: 988 call the [place]  National Suicide Prevention Lifeline: (916) 782-4636 or TTY: 419 871 4441 TTY (619)471-6942) to talk to a trained counselor call 1-800-273-TALK (toll free, 24 hour hotline) call the [facility]: 947-555-3397 call 911 if you are experiencing a Mental Health or Behavioral Health Crisis or need someone to talk to.  Rosina Forte, BSN RN [facility], [facility] RN Care Manager Direct Dial: [phone]  Fax: [phone]

## 2024-06-20 NOTE — Patient Outreach (Signed)
 Complex Care Management   Visit Note  06/20/2024  Name:  Clayton Peterson MRN: [id number] DOB: 1960-12-10  Situation: Referral received for Complex Care Management related to Diabetes with Complications and HLD I obtained verbal consent from Patient.  Visit completed with Patient  on the phone  Background:   Past Medical History:  Diagnosis Date   AAA (abdominal aortic aneurysm) (HCC)    Coronary artery disease    Diabetes mellitus without complication (HCC)    Heart attack (HCC)    High cholesterol    Hypertension     Assessment: Patient Reported Symptoms:  Cognitive Cognitive Status: No symptoms reported Cognitive/Intellectual Conditions Management [RPT]: None reported or documented in medical history or problem list   Health Maintenance Behaviors: Annual physical exam Healing Pattern: Average Health Facilitated by: Rest  Neurological Neurological Review of Symptoms: No symptoms reported Neurological Management Strategies: Routine screening Neurological Self-Management Outcome: 4 (good)  HEENT HEENT Symptoms Reported: Tinnitus HEENT Management Strategies: Routine screening HEENT Self-Management Outcome: 4 (good) HEENT Comment: Reports constant ringing in ear for over 20 years    Cardiovascular Cardiovascular Symptoms Reported: No symptoms reported Does patient have uncontrolled Hypertension?: No Cardiovascular Management Strategies: Medication therapy, Routine screening  Respiratory Respiratory Symptoms Reported: No symptoms reported Respiratory Management Strategies: Routine screening Respiratory Self-Management Outcome: 4 (good)  Endocrine Endocrine Symptoms Reported: No symptoms reported Is patient diabetic?: Yes Is patient checking blood sugars at home?: Yes List most recent blood sugar readings, include date and time of day: 06/20/2024 at 0600 102 Endocrine Self-Management Outcome: 4 (good)  Gastrointestinal Gastrointestinal Symptoms Reported: No symptoms  reported Gastrointestinal Self-Management Outcome: 4 (good)    Genitourinary Genitourinary Symptoms Reported: No symptoms reported Genitourinary Self-Management Outcome: 4 (good)  Integumentary Integumentary Symptoms Reported: No symptoms reported Skin Management Strategies: Routine screening Skin Self-Management Outcome: 4 (good)  Musculoskeletal Musculoskelatal Symptoms Reviewed: Difficulty walking Musculoskeletal Management Strategies: Coping strategies Musculoskeletal Self-Management Outcome: 4 (good) Falls in the past year?: No Number of falls in past year: 1 or less Was there an injury with Fall?: No Fall Risk Category Calculator: 0 Patient Fall Risk Level: Low Fall Risk Patient at Risk for Falls Due to: No Fall Risks Fall risk Follow up: Falls evaluation completed, Education provided  Psychosocial Psychosocial Symptoms Reported: No symptoms reported Behavioral Health Self-Management Outcome: 4 (good) Major Change/Loss/Stressor/Fears (CP): Denies Techniques to Cope with Loss/Stress/Change: Not applicable Quality of Family Relationships: helpful, involved, supportive Do you feel physically threatened by others?: No    06/20/2024    PHQ2-9 Depression Screening   Little interest or pleasure in doing things Not at all  Feeling down, depressed, or hopeless Not at all  PHQ-2 - Total Score 0  Trouble falling or staying asleep, or sleeping too much    Feeling tired or having little energy    Poor appetite or overeating     Feeling bad about yourself - or that you are a failure or have let yourself or your family down    Trouble concentrating on things, such as reading the newspaper or watching television    Moving or speaking so slowly that other people could have noticed.  Or the opposite - being so fidgety or restless that you have been moving around a lot more than usual    Thoughts that you would be better off dead, or hurting yourself in some way    PHQ2-9 Total Score    If  you checked off any problems, how difficult have these problems made it  for you to do your work, take care of things at home, or get along with other people    Depression Interventions/Treatment      Vitals:   06/20/24 0945  BP: 117/70    Medications Reviewed Today     Reviewed by Bertrum Rosina HERO, RN (Registered Nurse) on 06/20/24 at [phone]  Med List Status: <None>   Medication Order Taking? Sig Documenting Provider Last Dose Status Informant  acetaminophen  (TYLENOL ) 650 MG CR tablet [id number] Yes Take 1,300 mg by mouth every 8 (eight) hours as needed for pain. [provider]  Active Spouse/Significant Other  aspirin  81 MG EC tablet [id number] Yes Take 81 mg by mouth daily. [provider]  Active Spouse/Significant Other  atorvastatin  (LIPITOR) 80 MG tablet [id number] Yes Take 1 tablet (80 mg total) by mouth daily. Darryle Thom CROME, PA-C  Active   Blood Pressure Monitoring (BLOOD PRESSURE MONITOR 3) DEVI [id number] Yes Check blood pressure once daily 1 hour after taking blood pressure medication Tobie Suzzane POUR, MD  Active   Cholecalciferol (VITAMIN D3) 125 MCG (5000 UT) TABS [id number] Yes Take 1 tablet by mouth daily. [provider]  Active Spouse/Significant Other  Continuous Glucose Receiver (FREESTYLE LIBRE 3 READER) DEVI [id number] Yes 1 each by Does not apply route daily. Del Wilhelmena Falter, Hilario, FNP  Active   Continuous Glucose Sensor (FREESTYLE LIBRE 3 PLUS SENSOR) [place] [id number] Yes CHANGE SENSOR EVERY 15 DAYS. Del Orbe Polanco, Hilario, FNP  Active   dapagliflozin  propanediol (FARXIGA ) 10 MG TABS tablet [id number] Yes Take 1 tablet (10 mg total) by mouth daily before breakfast. Darryle Thom CROME, PA-C  Active   fenofibrate  micronized (LOFIBRA) 200 MG capsule [id number] Yes Take 1 capsule (200 mg total) by mouth daily before breakfast. Del Wilhelmena Falter, Hilario, FNP  Active   gabapentin  (NEURONTIN ) 400 MG capsule [id number] Yes Take 1 capsule (400 mg total) by mouth 2  (two) times daily. Del Wilhelmena Falter, Hilario, FNP  Active   glipiZIDE  (GLUCOTROL  XL) 5 MG 24 hr tablet [id number] Yes Take 1 tablet by mouth once daily with breakfast Nida, Gebreselassie W, MD  Active   insulin  glargine (LANTUS ) 100 UNIT/ML injection [id number] Yes Inject 0.3 mLs (30 Units total) into the skin at bedtime. Nida, Gebreselassie W, MD  Active   lisinopril  (ZESTRIL ) 20 MG tablet [id number] Yes Take 1 tablet (20 mg total) by mouth daily. Mona Vinie BROCKS, MD  Active   metoprolol  succinate (TOPROL -XL) 50 MG 24 hr tablet [id number] Yes Take 1 tablet (50 mg total) by mouth daily. Take with or immediately following a meal. Darryle Thom CROME, PA-C  Active   nitroGLYCERIN  (NITROSTAT ) 0.4 MG SL tablet [id number] Yes Place 1 tablet (0.4 mg total) under the tongue every 5 (five) minutes as needed for chest pain. Del Orbe Polanco, Hilario, FNP  Active Spouse/Significant Other  Omega-3 Fatty Acids (OMEGA 3 PO) [id number] Yes Take 2,000 mg by mouth 2 (two) times daily. [provider]  Active Spouse/Significant Other  ONETOUCH ULTRA test strip [id number] Yes USE 1 STRIP TO CHECK GLUCOSE THREE TIMES DAILY [provider]  Active   pantoprazole  (PROTONIX ) 40 MG tablet [id number] Yes TAKE 1 TABLET BY MOUTH TWICE DAILY BEFORE A MEAL Cindie Carlin POUR, DO  Active   RELION PEN NEEDLE 31G/8MM 31G X 8 MM MISC [id number] Yes USE 1  TWICE DAILY FOR DIABETES Del Orbe Polanco, [place], FNP  Active             Recommendation:  Continue Current Plan of Care  Follow Up Plan:   Telephone follow-up in 1 month  Rosina Forte, BSN RN [facility], [facility] RN Care Manager Direct Dial: [phone]  Fax: [phone]

## 2024-06-21 ENCOUNTER — Other Ambulatory Visit: Payer: Self-pay | Admitting: "Endocrinology

## 2024-06-21 DIAGNOSIS — E1159 Type 2 diabetes mellitus with other circulatory complications: Secondary | ICD-10-CM

## 2024-06-24 ENCOUNTER — Telehealth: Payer: Self-pay | Admitting: "Endocrinology

## 2024-06-24 DIAGNOSIS — E1159 Type 2 diabetes mellitus with other circulatory complications: Secondary | ICD-10-CM

## 2024-06-24 MED ORDER — INSULIN GLARGINE 100 UNIT/ML ~~LOC~~ SOLN
30.0000 [IU] | Freq: Every day | SUBCUTANEOUS | 1 refills | Status: AC
Start: 2024-06-24 — End: ?

## 2024-06-24 NOTE — Telephone Encounter (Signed)
 Rx for Lantus  30 units at bedtime 90 day supply with 1 refill sent to [facility].

## 2024-06-24 NOTE — Telephone Encounter (Signed)
 Pt has one more days worth.  Can the Lantus  please be called in today

## 2024-06-25 ENCOUNTER — Other Ambulatory Visit: Payer: Self-pay | Admitting: Family Medicine

## 2024-07-12 ENCOUNTER — Other Ambulatory Visit: Payer: Self-pay | Admitting: Family Medicine

## 2024-07-12 DIAGNOSIS — E119 Type 2 diabetes mellitus without complications: Secondary | ICD-10-CM

## 2024-07-15 ENCOUNTER — Other Ambulatory Visit: Payer: Self-pay | Admitting: Internal Medicine

## 2024-07-16 ENCOUNTER — Other Ambulatory Visit: Payer: Self-pay | Admitting: Internal Medicine

## 2024-07-18 ENCOUNTER — Other Ambulatory Visit: Payer: Self-pay | Admitting: *Deleted

## 2024-07-18 NOTE — Patient Outreach (Signed)
 Complex Care Management   Visit Note  07/18/2024  Name:  Clayton Peterson MRN: [id number] DOB: [birth year]  Situation: Referral received for Complex Care Management related to Diabetes with Complications and HLD I obtained verbal consent from Patient.  Visit completed with Patient  on the phone  Background:   Past Medical History:  Diagnosis Date   AAA (abdominal aortic aneurysm)    Coronary artery disease    Diabetes mellitus without complication (HCC)    Heart attack (HCC)    High cholesterol    Hypertension     Assessment: Patient Reported Symptoms:  Cognitive Cognitive Status: No symptoms reported Cognitive/Intellectual Conditions Management [RPT]: None reported or documented in medical history or problem list   Health Maintenance Behaviors: Annual physical exam Healing Pattern: Average Health Facilitated by: Rest  Neurological Neurological Review of Symptoms: No symptoms reported Neurological Management Strategies: Routine screening Neurological Self-Management Outcome: 4 (good)  HEENT HEENT Symptoms Reported: Nasal discharge HEENT Management Strategies: Routine screening HEENT Self-Management Outcome: 4 (good)    Cardiovascular Cardiovascular Symptoms Reported: No symptoms reported Does patient have uncontrolled Hypertension?: No Cardiovascular Management Strategies: Routine screening Cardiovascular Self-Management Outcome: 4 (good)  Respiratory Respiratory Symptoms Reported: No symptoms reported Respiratory Management Strategies: Routine screening Respiratory Self-Management Outcome: 4 (good)  Endocrine Endocrine Symptoms Reported: No symptoms reported Is patient diabetic?: Yes Is patient checking blood sugars at home?: Yes List most recent blood sugar readings, include date and time of day: 07/18/24 at 0630 132 Endocrine Self-Management Outcome: 4 (good)  Gastrointestinal Gastrointestinal Symptoms Reported: No symptoms reported Gastrointestinal Self-Management  Outcome: 4 (good)    Genitourinary Genitourinary Symptoms Reported: No symptoms reported Genitourinary Self-Management Outcome: 4 (good)  Integumentary Integumentary Symptoms Reported: No symptoms reported Skin Management Strategies: Routine screening Skin Self-Management Outcome: 4 (good)  Musculoskeletal Musculoskelatal Symptoms Reviewed: Joint pain, Difficulty walking Musculoskeletal Management Strategies: Routine screening Musculoskeletal Self-Management Outcome: 4 (good) Falls in the past year?: No Number of falls in past year: 1 or less Was there an injury with Fall?: No Fall Risk Category Calculator: 0 Patient Fall Risk Level: Low Fall Risk Patient at Risk for Falls Due to: History of fall(s) Fall risk Follow up: Falls evaluation completed  Psychosocial Psychosocial Symptoms Reported: No symptoms reported Behavioral Management Strategies: Coping strategies Behavioral Health Self-Management Outcome: 4 (good) Major Change/Loss/Stressor/Fears (CP): Denies Techniques to Cope with Loss/Stress/Change: Not applicable Quality of Family Relationships: helpful, involved, supportive Do you feel physically threatened by others?: No    07/18/2024    PHQ2-9 Depression Screening   Little interest or pleasure in doing things Not at all  Feeling down, depressed, or hopeless Not at all  PHQ-2 - Total Score 0  Trouble falling or staying asleep, or sleeping too much    Feeling tired or having little energy    Poor appetite or overeating     Feeling bad about yourself - or that you are a failure or have let yourself or your family down    Trouble concentrating on things, such as reading the newspaper or watching television    Moving or speaking so slowly that other people could have noticed.  Or the opposite - being so fidgety or restless that you have been moving around a lot more than usual    Thoughts that you would be better off dead, or hurting yourself in some way    PHQ2-9 Total Score     If you checked off any problems, how difficult have these problems made it for you to  do your work, take care of things at home, or get along with other people    Depression Interventions/Treatment      Vitals:   07/18/24 0945  BP: 135/75    Medications Reviewed Today     Reviewed by Bertrum Rosina HERO, RN (Registered Nurse) on 07/18/24 at [phone]  Med List Status: <None>   Medication Order Taking? Sig Documenting Provider Last Dose Status Informant  acetaminophen  (TYLENOL ) 650 MG CR tablet [id number] Yes Take 1,300 mg by mouth every 8 (eight) hours as needed for pain. [provider]  Active Spouse/Significant Other  aspirin  81 MG EC tablet [id number] Yes Take 81 mg by mouth daily. [provider]  Active Spouse/Significant Other  atorvastatin  (LIPITOR) 80 MG tablet [id number] Yes Take 1 tablet (80 mg total) by mouth daily. Darryle Thom CROME, PA-C  Active   Blood Pressure Monitoring (BLOOD PRESSURE MONITOR 3) DEVI [id number] Yes Check blood pressure once daily 1 hour after taking blood pressure medication Tobie Suzzane POUR, MD  Active   Cholecalciferol (VITAMIN D3) 125 MCG (5000 UT) TABS [id number] Yes Take 1 tablet by mouth daily. [provider]  Active Spouse/Significant Other  Continuous Glucose Receiver (FREESTYLE LIBRE 3 READER) DEVI [id number] Yes 1 each by Does not apply route daily. Del Wilhelmena Falter, Hilario, FNP  Active   Continuous Glucose Sensor (FREESTYLE LIBRE 3 PLUS SENSOR) [place] [id number] Yes USE FOR CONTINUOUS GLUCOSE MONITORING. CHANGE SENSOR EVERY 15 DAYS Del Wilhelmena Falter, [place], [place]  Active   dapagliflozin  propanediol (FARXIGA ) 10 MG TABS tablet [id number] Yes Take 1 tablet (10 mg total) by mouth daily before breakfast. Darryle Thom CROME, PA-C  Active   fenofibrate  micronized (LOFIBRA) 200 MG capsule [id number] Yes Take 1 capsule (200 mg total) by mouth daily before breakfast. Del Wilhelmena Falter, Hilario, FNP  Active   gabapentin  (NEURONTIN ) 400 MG capsule [id number]  Yes Take 1 capsule by mouth twice daily Del Orbe Polanco, [place], FNP  Active   glipiZIDE  (GLUCOTROL  XL) 5 MG 24 hr tablet [id number] Yes Take 1 tablet by mouth once daily with breakfast Nida, Gebreselassie W, MD  Active   insulin  glargine (LANTUS ) 100 UNIT/ML injection [id number] Yes Inject 0.3 mLs (30 Units total) into the skin at bedtime. Nida, Gebreselassie W, MD  Active   lisinopril  (ZESTRIL ) 20 MG tablet [id number] Yes Take 1 tablet (20 mg total) by mouth daily. Mona Vinie BROCKS, MD  Active   metoprolol  succinate (TOPROL -XL) 50 MG 24 hr tablet [id number] Yes Take 1 tablet (50 mg total) by mouth daily. Take with or immediately following a meal. Darryle Thom CROME, PA-C  Active   nitroGLYCERIN  (NITROSTAT ) 0.4 MG SL tablet [id number] Yes Place 1 tablet (0.4 mg total) under the tongue every 5 (five) minutes as needed for chest pain. Del Orbe Polanco, Hilario, FNP  Active Spouse/Significant Other  Omega-3 Fatty Acids (OMEGA 3 PO) [id number] Yes Take 2,000 mg by mouth 2 (two) times daily. [provider]  Active Spouse/Significant Other  ONETOUCH ULTRA test strip [id number] Yes USE 1 STRIP TO CHECK GLUCOSE THREE TIMES DAILY [provider]  Active   pantoprazole  (PROTONIX ) 40 MG tablet [id number] Yes TAKE 1 TABLET BY MOUTH TWICE DAILY BEFORE A MEAL Cindie Carlin POUR, DO  Active   RELION PEN NEEDLE 31G/8MM 31G X 8 MM MISC [id number] Yes USE 1  TWICE DAILY FOR DIABETES Del Orbe Polanco, [place], FNP  Active             Recommendation:   Continue Current  Plan of Care  Follow Up Plan:   Telephone follow-up in 1 month  Rosina Forte, BSN RN [facility], [facility] RN Care Manager Direct Dial: [phone]  Fax: [phone]

## 2024-07-18 NOTE — Patient Instructions (Signed)
 Visit Information  Thank you for taking time to visit with me today. Please don't hesitate to contact me if I can be of assistance to you before our next scheduled appointment.  Your next care management appointment is by telephone on 08-18-2024 at 9:30 am  Telephone follow-up in 1 month  Please call the care guide team at [phone] if you need to cancel, schedule, or reschedule an appointment.   Please call the Suicide and Crisis Lifeline: 988 call the [place]  National Suicide Prevention Lifeline: 917-542-5905 or TTY: 262-387-4303 TTY 404-021-4386) to talk to a trained counselor call 1-800-273-TALK (toll free, 24 hour hotline) if you are experiencing a Mental Health or Behavioral Health Crisis or need someone to talk to.  Rosina Forte, BSN RN [facility], [facility] RN Care Manager Direct Dial: [phone]  Fax: [phone]

## 2024-07-21 ENCOUNTER — Ambulatory Visit: Attending: Internal Medicine | Admitting: Internal Medicine

## 2024-07-21 ENCOUNTER — Encounter: Payer: Self-pay | Admitting: Internal Medicine

## 2024-07-21 VITALS — BP 105/63 | HR 58 | Ht 69.0 in | Wt 209.0 lb

## 2024-07-21 DIAGNOSIS — Z8719 Personal history of other diseases of the digestive system: Secondary | ICD-10-CM

## 2024-07-21 DIAGNOSIS — I2510 Atherosclerotic heart disease of native coronary artery without angina pectoris: Secondary | ICD-10-CM | POA: Diagnosis not present

## 2024-07-21 DIAGNOSIS — E782 Mixed hyperlipidemia: Secondary | ICD-10-CM

## 2024-07-21 MED ORDER — FENOFIBRATE 54 MG PO TABS: 54.0000 mg | ORAL_TABLET | Freq: Every day | ORAL | 3 refills | Status: AC

## 2024-07-21 NOTE — Patient Instructions (Signed)
 Medication Instructions:  STOP fenofibrate  200mg   START fenofibrate  54mg  daily  *If you need a refill on your cardiac medications before your next appointment, please call your pharmacy*  Lab Work: FASTING lab work in 3 months -- lipid panel, BMET  If you have labs (blood work) drawn today and your tests are completely normal, you will receive your results only by: MyChart Message (if you have MyChart) OR A paper copy in the mail If you have any lab test that is abnormal or we need to change your treatment, we will call you to review the results.  Follow-Up: At [facility], you and your health needs are our priority.  As part of our continuing mission to provide you with exceptional heart care, our providers are all part of one team.  This team includes your primary Cardiologist (physician) and Advanced Practice Providers or APPs (Physician Assistants and Nurse Practitioners) who all work together to provide you with the care you need, when you need it.  Your next appointment:    12 months  We recommend signing up for the patient portal called MyChart.  Sign up information is provided on this After Visit Summary.  MyChart is used to connect with patients for Virtual Visits (Telemedicine).  Patients are able to view lab/test results, encounter notes, upcoming appointments, etc.  Non-urgent messages can be sent to your provider as well.   To learn more about what you can do with MyChart, go to [email or web address].

## 2024-07-21 NOTE — Progress Notes (Signed)
 LIPID CLINIC CONSULT NOTE  Chief Complaint:  Follow-up   Primary Care Physician: Terry Wilhelmena Lloyd Hilario, FNP  Primary Cardiologist:  None  HPI:  MABEL UNREIN is a 63 y.o. male who is being seen today for the evaluation of dyslipidemia at the request of Darryle Thom CROME, PA-C. This is a pleasant 63 year old male who was referred for evaluation management of dyslipidemia.  He does have a history of heart disease including a prior heart attack in 2012 at which time he was found to have coronary artery disease including stenosis in the mid RCA and underwent a vision MultiLink stent (3.0 x 28 mm).  Prior to that, he underwent open repair of a AAA which apparently burst on the table as they were planning to repair it via a stent graft.  He also has type 2 diabetes, hypertension, and family history of early onset heart disease.  More recently he had an episode of pancreatitis and at that time was noted to have very high triglycerides of 709.  After additional therapy, labs 3 weeks ago showed total cholesterol 102, triglycerides 420, HDL 18 and LDL 26.  Currently is asymptomatic denying chest pain worsening shortness of breath or abdominal pain.  He reports a varied diet and work to try and reduce sugars and saturated fats.  05/29/2022  Mr. Cupo returns today for follow-up of dyslipidemia.  Lipids do show improvement after making adjustment in his meds.  LDL particle numbers now 992, LDL-C of 59, HDL-C of 27, and triglycerides 305 (down from 420 and previously 709).  He did undergo CT that I ordered to follow-up on AAA repair which was performed emergently back in 2009.  This shows aneurysm around the repair up to 4.4 cm.  No evidence of endoleak.  He was also noted to have a pancreatic pseudocyst denoting resolution of recent pancreatitis.  He is followed by GI.  03/15/2023  Mr. Jacobo is seen today in follow-up.  Unfortunately was hospitalized in April with chest pain.  He thought he was  having a heart attack.  He ultimately underwent cardiac catheterization which showed an occlusion of the RCA at the area of the previously placed stent.  There were left-to-right collaterals.  Otherwise there was mild proximal LAD stenosis and some mild plaque in the circumflex.  Medical therapy was recommended.  An echo was performed which I personally reviewed and showed normal LV function.  Subsequently was found to have a severe pancreatic pseudocyst which was the cause of his pain.  He underwent stenting and management by Dr. Wilhelmenia.  Since then he has done fairly well.  He does have a repeat EGD scheduled in June.  His lipids have not been too bad.  An LP(a) was performed in the hospital was negative at 35.3 nmol/L.  His last lipid profile showed total cholesterol 144, triglycerides 218, HDL 35 and LDL 73.  He has been compliant with his medications.  His blood sugar has not been recently well-controlled however he is on an adjusted regimen now.  09/18/2023  Mr. Jacobo returns today for follow-up.  He reports no worsening or recurrent pain related to his pancreatic pseudocyst.  Denies any cardiac sounding chest pain.  His A1c is trending upwards somewhat.  Recently was 7.4% however continuous glucose monitor shows about 7.9%.  LDL 2 months ago was 91, total cholesterol 145, triglycerides 131 HDL 30.  His LDL previously had been about 72-73.  This may be dietary related.  While he was  hospitalized he was taken off of lisinopril , possibly due to renal function.  He is on amlodipine  and metoprolol  but blood pressure remains elevated.  Initial blood pressure was 154/76 and recheck came down to 140/78.  07/21/2024  Mr. Jacobo is seen today in follow-up.  Overall he says he is feeling well.  He has not had any pain associated to his pancreatic pseudocyst or any chest pain.  He showed me a continuous glucose monitor which indicates his A1c is around 7%.  Recently had lipids which showed total  cholesterol 147, triglycerides 291, HDL 26 and LDL 74.  Based on this his PCP increased his fenofibrate  up to 200 mg daily.  Unfortunately, he does have stage III chronic kidney disease and his most recent creatinine actually was higher at 1.88 in June 2025.  Fenofibrate  will need to be renally adjusted for GFR less than 60 and held for GFR is less than 30.  PMHx:  Past Medical History:  Diagnosis Date   AAA (abdominal aortic aneurysm)    Coronary artery disease    Diabetes mellitus without complication (HCC)    Heart attack (HCC)    High cholesterol    Hypertension     Past Surgical History:  Procedure Laterality Date   ABDOMINAL AORTIC ANEURYSM REPAIR     BACK SURGERY     x2   BIOPSY  06/03/2021   Procedure: BIOPSY;  Surgeon: Cindie Carlin POUR, DO;  [facility];  Service: Endoscopy;;   BIOPSY  02/09/2023   Procedure: BIOPSY;  Surgeon: Leigh Elspeth SQUIBB, MD;  Location: [facility] ENDOSCOPY;  Service: Gastroenterology;;   CARDIAC CATHETERIZATION     stent placed on right side ofo heart   COLONOSCOPY WITH PROPOFOL  N/A 06/03/2021   Surgeon: Cindie Carlin POUR, DO; Nonbleeding internal hemorrhoids, 6 mm polyp in sigmoid colon, 2 mm polyp in the ascending colon, congested and erythematous mucosa in the cecum biopsied.  Pathology revealed 2 tubular adenomas, cecal biopsy with polypoid colonic mucosa with ectatic vascular channels with small vascular malformation not ruled out. Repeat in 5 years.   CYST ENTEROSTOMY N/A 02/13/2023   Procedure: CYST GASTROSTOMY;  Surgeon: Wilhelmenia Aloha Raddle., MD;  Location: [facility] ENDOSCOPY;  Service: Gastroenterology;  Laterality: N/A;   CYST ENTEROSTOMY  04/02/2023   Procedure: CYST NECROSECTOMY;  Surgeon: Wilhelmenia Aloha Raddle., MD;  Location: [facility] ENDOSCOPY;  Service: Gastroenterology;;   ESOPHAGOGASTRODUODENOSCOPY N/A 02/11/2023   Procedure: ESOPHAGOGASTRODUODENOSCOPY (EGD);  Surgeon: Rollin Dover, MD;  Location: [facility] ENDOSCOPY;  Service:  Gastroenterology;  Laterality: N/A;   ESOPHAGOGASTRODUODENOSCOPY (EGD) WITH PROPOFOL  N/A 06/03/2021   Surgeon: Cindie Carlin POUR, DO; Gastritis biopsied, nonobstructing, nonbleeding duodenal ulcer with no stigmata of bleeding, NSAID induced etiology.  Pathology with chronic gastritis, negative for H. pylori.   ESOPHAGOGASTRODUODENOSCOPY (EGD) WITH PROPOFOL  N/A 02/09/2023   Procedure: ESOPHAGOGASTRODUODENOSCOPY (EGD) WITH PROPOFOL ;  Surgeon: Leigh Elspeth SQUIBB, MD;  Location[facility] ENDOSCOPY;  Service: Gastroenterology;  Laterality: N/A;   ESOPHAGOGASTRODUODENOSCOPY (EGD) WITH PROPOFOL  N/A 02/13/2023   Procedure: ESOPHAGOGASTRODUODENOSCOPY (EGD) WITH PROPOFOL ;  Surgeon: Wilhelmenia Aloha Raddle., MD;  Location: [facility] ENDOSCOPY;  Service: Gastroenterology;  Laterality: N/A;   ESOPHAGOGASTRODUODENOSCOPY (EGD) WITH PROPOFOL  N/A 02/22/2023   Procedure: ESOPHAGOGASTRODUODENOSCOPY (EGD) WITH PROPOFOL ;  Surgeon: Wilhelmenia Aloha Raddle., MD;  Location: [facility] ENDOSCOPY;  Service: Gastroenterology;  Laterality: N/A;   ESOPHAGOGASTRODUODENOSCOPY (EGD) WITH PROPOFOL  N/A 04/02/2023   Procedure: ESOPHAGOGASTRODUODENOSCOPY (EGD) WITH PROPOFOL ;  Surgeon: Wilhelmenia Aloha Raddle., MD;  Location: [facility] ENDOSCOPY;  Service: Gastroenterology;  Laterality: N/A;   EUS Left  02/11/2023   Procedure: UPPER ENDOSCOPIC ULTRASOUND (EUS) LINEAR;  Surgeon: Rollin Dover, MD;  Location: [facility] ENDOSCOPY;  Service: Gastroenterology;  Laterality: Left;   EUS N/A 02/13/2023   Procedure: UPPER ENDOSCOPIC ULTRASOUND (EUS) LINEAR;  Surgeon: Wilhelmenia Aloha Raddle., MD;  Location: [facility] ENDOSCOPY;  Service: Gastroenterology;  Laterality: N/A;   KNEE SURGERY Left    LEFT HEART CATH AND CORONARY ANGIOGRAPHY N/A 02/07/2023   Procedure: LEFT HEART CATH AND CORONARY ANGIOGRAPHY;  Surgeon: Verlin Lonni BIRCH, MD;  Location: [facility] INVASIVE CV LAB;  Service: Cardiovascular;  Laterality: N/A;   PANCREATIC STENT PLACEMENT  02/13/2023   Procedure: PANCREATIC STENT  PLACEMENT;  Surgeon: Wilhelmenia Aloha Raddle., MD;  Location: [facility] ENDOSCOPY;  Service: Gastroenterology;;   PANCREATIC STENT PLACEMENT  02/22/2023   Procedure: PANCREATIC STENT PLACEMENT;  Surgeon: Wilhelmenia Aloha Raddle., MD;  Location: [facility] ENDOSCOPY;  Service: Gastroenterology;;   PARTIAL COLECTOMY     POLYPECTOMY  06/03/2021   Procedure: POLYPECTOMY INTESTINAL;  Surgeon: Cindie Carlin POUR, DO;  [facility];  Service: Endoscopy;;   STENT REMOVAL  02/22/2023   Procedure: STENT REMOVAL;  Surgeon: Wilhelmenia Aloha Raddle., MD;  Location: [facility] ENDOSCOPY;  Service: Gastroenterology;;   CLEDA REMOVAL  04/02/2023   Procedure: STENT REMOVAL;  Surgeon: Wilhelmenia Aloha Raddle., MD;  Location: [facility] ENDOSCOPY;  Service: Gastroenterology;;    FAMHx:  Family History  Problem Relation Age of Onset   Atrial fibrillation Mother    Alzheimer's disease Mother    Diabetes Brother    Alzheimer's disease Maternal Grandmother    Aneurysm Maternal Grandfather    Pancreatitis Neg Hx    Pancreatic cancer Neg Hx    Colon cancer Neg Hx     SOCHx:   reports that he has been smoking cigarettes. He started smoking about 48 years ago. He has a 48.7 pack-year smoking history. He has been exposed to tobacco smoke. He has never used smokeless tobacco. He reports that he does not currently use alcohol. He reports that he does not use drugs.  ALLERGIES:  Allergies  Allergen Reactions   Bee Venom Anaphylaxis   Coconut (Cocos Nucifera) Anaphylaxis and Other (See Comments)    coconut allergenic extract   Coconut Oil Anaphylaxis    ROS: Pertinent items noted in HPI and remainder of comprehensive ROS otherwise negative.  HOME MEDS: Current Outpatient Medications on File Prior to Visit  Medication Sig Dispense Refill   acetaminophen  (TYLENOL ) 650 MG CR tablet Take 1,300 mg by mouth every 8 (eight) hours as needed for pain.     aspirin  81 MG EC tablet Take 81 mg by mouth daily.     atorvastatin  (LIPITOR) 80 MG  tablet Take 1 tablet (80 mg total) by mouth daily. 90 tablet 3   Blood Pressure Monitoring (BLOOD PRESSURE MONITOR 3) DEVI Check blood pressure once daily 1 hour after taking blood pressure medication 1 each 0   Cholecalciferol (VITAMIN D3) 125 MCG (5000 UT) TABS Take 1 tablet by mouth daily.     Continuous Glucose Receiver (FREESTYLE LIBRE 3 READER) DEVI 1 each by Does not apply route daily. 1 each 4   Continuous Glucose Sensor (FREESTYLE LIBRE 3 PLUS SENSOR) MISC USE FOR CONTINUOUS GLUCOSE MONITORING. CHANGE SENSOR EVERY 15 DAYS 2 each 0   dapagliflozin  propanediol (FARXIGA ) 10 MG TABS tablet Take 1 tablet (10 mg total) by mouth daily before breakfast. 90 tablet 3   gabapentin  (NEURONTIN ) 400 MG capsule Take 1 capsule by mouth twice daily 60 capsule 0  glipiZIDE  (GLUCOTROL  XL) 5 MG 24 hr tablet Take 1 tablet by mouth once daily with breakfast 90 tablet 0   insulin  glargine (LANTUS ) 100 UNIT/ML injection Inject 0.3 mLs (30 Units total) into the skin at bedtime. 30 mL 1   lisinopril  (ZESTRIL ) 20 MG tablet Take 1 tablet (20 mg total) by mouth daily. 90 tablet 3   metoprolol  succinate (TOPROL -XL) 50 MG 24 hr tablet Take 1 tablet (50 mg total) by mouth daily. Take with or immediately following a meal. 90 tablet 3   nitroGLYCERIN  (NITROSTAT ) 0.4 MG SL tablet Place 1 tablet (0.4 mg total) under the tongue every 5 (five) minutes as needed for chest pain. 50 tablet 3   Omega-3 Fatty Acids (OMEGA 3 PO) Take 2,000 mg by mouth 2 (two) times daily.     ONETOUCH ULTRA test strip USE 1 STRIP TO CHECK GLUCOSE THREE TIMES DAILY     pantoprazole  (PROTONIX ) 40 MG tablet TAKE 1 TABLET BY MOUTH TWICE DAILY BEFORE A MEAL 180 tablet 0   RELION PEN NEEDLE 31G/8MM 31G X 8 MM MISC USE 1  TWICE DAILY FOR DIABETES 100 each 0   No current facility-administered medications on file prior to visit.    LABS/IMAGING: No results found for this or any previous visit (from the past 48 hours). No results found.  LIPID PANEL:     Component Value Date/Time   CHOL 147 02/29/2024 1008   TRIG 291 (H) 02/29/2024 1008   HDL 26 (L) 02/29/2024 1008   CHOLHDL 5.7 (H) 02/29/2024 1008   CHOLHDL 5.7 02/15/2022 0406   VLDL UNABLE TO CALCULATE IF TRIGLYCERIDE OVER 400 mg/dL 95/73/7976 9593   LDLCALC 74 02/29/2024 1008   LDLDIRECT 64 05/25/2022 0844   LDLDIRECT 26.0 02/15/2022 0406    WEIGHTS: Wt Readings from Last 3 Encounters:  07/21/24 209 lb (94.8 kg)  04/30/24 209 lb 12.8 oz (95.2 kg)  04/21/24 209 lb (94.8 kg)    VITALS: BP 105/63 (BP Location: Left Arm, Patient Position: Sitting, Cuff Size: Normal)   Pulse (!) 58   Ht 5' 9 (1.753 m)   Wt 209 lb (94.8 kg)   SpO2 92%   BMI 30.86 kg/m   EXAM: General appearance: alert and no distress Neck: no carotid bruit, no JVD, and thyroid not enlarged, symmetric, no tenderness/mass/nodules Lungs: clear to auscultation bilaterally Heart: regular rate and rhythm, S1, S2 normal, no murmur, click, rub or gallop Abdomen: soft, non-tender; bowel sounds normal; no masses,  no organomegaly and midline abdominal scar Extremities: extremities normal, atraumatic, no cyanosis or edema Pulses: 2+ and symmetric Skin: Skin color, texture, turgor normal. No rashes or lesions Neurologic: Grossly normal Psych: Pleasant  EKG: Deferred  ASSESSMENT: Coronary artery disease with prior MI, status post PCI to the mid RCA with a 3.0 x 28 mm vision MultiLink stent (2012) -repeat cardiac catheterization showed an occluded RCA with left-to-right collaterals but otherwise no significant stenosis (01/2023) History of ruptured AAA in 2009 status post open emergent repair Hypertension Dyslipidemia with high triglycerides and recent pancreatitis, negative LP(a) Type 2 diabetes Pancreatic pseudocyst -status post stenting  PLAN: 1.   Mr. Jacobo seems to be doing well without recurrent chest pain and no issues with his pancreatic pseudocyst.  Blood pressure is well-controlled today.  His A1c  has improved somewhat to 7.0% (by CGM).  Triglycerides are elevated but still reasonably controlled with a goal to keep his triglycerides less than 500 not necessarily to try to get his triglycerides to less  than 150.  He did have a recent increase in his fenofibrate  by his PCP up to the 200 mg dose.  Given his reduced GFR, this dose will need to be reduced as it will actually worsen renal function.  Plan to switch to fenofibrate  54 mg daily.  I will order repeat lipids and a metabolic profile in about 3 months.  Plan follow-up with me annually or sooner as necessary.  Vinie KYM Maxcy, MD, [facility], FNLA, FACP  [place]  [facility]  Medical Director of the Advanced Lipid Disorders &  Cardiovascular Risk Reduction Clinic Diplomate of the American Board of Clinical Lipidology Attending Cardiologist  Direct Dial: [phone]  Fax: [phone]  Website:  [email or web address] Vinie BROCKS Masiel Gentzler 07/21/2024, 1:06 PM

## 2024-07-25 ENCOUNTER — Other Ambulatory Visit: Payer: Self-pay | Admitting: Family Medicine

## 2024-08-01 ENCOUNTER — Other Ambulatory Visit: Payer: Self-pay | Admitting: Family Medicine

## 2024-08-10 ENCOUNTER — Other Ambulatory Visit: Payer: Self-pay | Admitting: Family Medicine

## 2024-08-10 DIAGNOSIS — E119 Type 2 diabetes mellitus without complications: Secondary | ICD-10-CM

## 2024-08-11 ENCOUNTER — Other Ambulatory Visit: Payer: Self-pay

## 2024-08-11 DIAGNOSIS — I714 Abdominal aortic aneurysm, without rupture, unspecified: Secondary | ICD-10-CM

## 2024-08-18 ENCOUNTER — Encounter: Payer: Self-pay | Admitting: *Deleted

## 2024-08-18 ENCOUNTER — Telehealth: Payer: Self-pay | Admitting: *Deleted

## 2024-08-18 NOTE — Patient Instructions (Signed)
 Clayton Peterson - I am sorry I was unable to reach you today for our scheduled appointment. I work with Del Wilhelmena Lloyd Sola, FNP and am calling to support your healthcare needs. Please contact me at [phone] at your earliest convenience. I look forward to speaking with you soon.   Thank you,  Rosina Forte, BSN RN [facility], [facility] RN Care Manager Direct Dial: [phone]  Fax: [phone]

## 2024-08-29 ENCOUNTER — Other Ambulatory Visit: Payer: Self-pay | Admitting: Family Medicine

## 2024-09-01 ENCOUNTER — Ambulatory Visit

## 2024-09-01 VITALS — BP 105/56 | HR 49 | Ht 69.0 in | Wt 209.0 lb

## 2024-09-01 DIAGNOSIS — E559 Vitamin D deficiency, unspecified: Secondary | ICD-10-CM | POA: Diagnosis not present

## 2024-09-01 DIAGNOSIS — I10 Essential (primary) hypertension: Secondary | ICD-10-CM | POA: Diagnosis not present

## 2024-09-01 DIAGNOSIS — E785 Hyperlipidemia, unspecified: Secondary | ICD-10-CM | POA: Diagnosis not present

## 2024-09-01 DIAGNOSIS — K219 Gastro-esophageal reflux disease without esophagitis: Secondary | ICD-10-CM | POA: Diagnosis not present

## 2024-09-01 MED ORDER — PANTOPRAZOLE SODIUM 40 MG PO TBEC: 40.0000 mg | DELAYED_RELEASE_TABLET | Freq: Two times a day (BID) | ORAL | 2 refills | Status: AC

## 2024-09-01 NOTE — Progress Notes (Unsigned)
 Established Patient Office Visit  Subjective   Patient ID: Clayton Peterson, male    DOB: 1961-07-01  Age: 63 y.o. MRN: [id number]  Chief Complaint  Patient presents with   Medical Management of Chronic Issues    6 month follow up     HPI Discussed the use of AI scribe software for clinical note transcription with the patient, who gave verbal consent to proceed.  History of Present Illness    Clayton Peterson is a 63 year old male with hypertension and diabetes who presents for a follow-up visit.  Blood pressure abnormalities and associated symptoms - Blood pressure has been running low recently - Occasional lightheadedness, attributed to heat - No dizziness or syncope  Head trauma - Sustained a head injury from a dumpster door resulting in a cut - Wound was cleaned and antibiotic ointment applied by his daughter - No significant symptoms such as headache - Did not seek medical attention  Gastrointestinal symptoms and medication access - Prescription for pantoprazole  ran out a few weeks ago - Attempted to refill at [facility] but was informed a doctor visit is required - Occasional stomach discomfort  Medication management issues - Currently taking gabapentin ; refilled last week but not yet notified by [facility] for pickup - Recent change in insulin  prescription to thirty units caused a delay in receiving medication from [facility]     Patient Active Problem List   Diagnosis Date Noted   Vitamin D  deficiency 09/04/2024   Metabolic dysfunction-associated steatotic liver disease (MASLD) 04/30/2024   Current smoker 12/28/2023   Current use of insulin  (HCC) 08/29/2023   Gastric ulcer without hemorrhage or perforation 02/09/2023   Gastroesophageal reflux disease 02/08/2023   Abdominal pain, epigastric 02/08/2023   History of ulcer disease 02/08/2023   Chest pain 02/07/2023   AAA (abdominal aortic aneurysm) without rupture 02/06/2023   Chronic chest pain with high risk for  coronary artery disease 12/28/2022   Abdominal pain 11/03/2022   Pancreatic pseudocyst 11/03/2022   Heartburn 05/03/2022   History of acute pancreatitis 05/03/2022   Hyperlipidemia 02/14/2022   DM type 2 causing vascular disease (HCC) 02/14/2022   CKD (chronic kidney disease) 02/14/2022   Acute pancreatitis 02/13/2022   Primary hypertension    Coronary artery disease    AKI (acute kidney injury) 11/13/2019   Gastroparesis 11/14/2018   Proteinuria due to type 2 diabetes mellitus (HCC) 11/14/2018   OSA on CPAP 05/08/2017   Partial small bowel obstruction (HCC) 01/07/2017   HNP (herniated nucleus pulposus), lumbar 06/13/2016   Lumbar spinal stenosis 06/12/2016   Lumbar radiculitis 04/06/2016   Trigger middle finger of right hand 02/23/2015   Cigarette nicotine  dependence without complication 08/05/2014   Class 3 severe obesity due to excess calories without serious comorbidity with body mass index (BMI) of 40.0 to 44.9 in adult [facility]) 08/05/2014   Hydronephrosis with renal and ureteral calculous obstruction 08/05/2014    ROS    Objective:     BP (!) 105/56   Pulse (!) 49   Ht 5' 9 (1.753 m)   Wt 209 lb (94.8 kg)   SpO2 95%   BMI 30.86 kg/m    Physical Exam Vitals and nursing note reviewed.  Constitutional:      Appearance: Normal appearance.  HENT:     Head: Normocephalic.     Right Ear: Tympanic membrane, ear canal and external ear normal.     Left Ear: Tympanic membrane, ear canal and external ear normal.  Nose: Nose normal.     Mouth/Throat:     Mouth: Mucous membranes are moist.     Pharynx: Oropharynx is clear.  Eyes:     Extraocular Movements: Extraocular movements intact.     Pupils: Pupils are equal, round, and reactive to light.  Cardiovascular:     Rate and Rhythm: Normal rate and regular rhythm.  Pulmonary:     Effort: Pulmonary effort is normal.     Breath sounds: Normal breath sounds.  Musculoskeletal:     Cervical back: Normal range of motion  and neck supple.  Skin:    General: Skin is warm and dry.  Neurological:     Mental Status: He is alert and oriented to person, place, and time.  Psychiatric:        Mood and Affect: Mood normal.        Thought Content: Thought content normal.          The ASCVD Risk score (Arnett DK, et al., 2019) failed to calculate for the following reasons:   Risk score cannot be calculated because patient has a medical history suggesting prior/existing ASCVD    Assessment & Plan:   Problem List Items Addressed This Visit       Cardiovascular and Mediastinum   Primary hypertension   Blood pressure well-controlled. Occasional lightheadedness likely due to heat exposure.      Relevant Orders   CBC with Differential/Platelet (Completed)   CMP14+EGFR (Completed)     Digestive   Gastroesophageal reflux disease   Intermittent stomach discomfort managed with pantoprazole . Prescription refill needed. - Refilled pantoprazole  prescription for 90-day supply.      Relevant Medications   pantoprazole  (PROTONIX ) 40 MG tablet     Other   Hyperlipidemia - Primary   -Continue Crestor  and fenofibrate  - Recheck fasting labs today.      Relevant Orders   CBC with Differential/Platelet (Completed)   Lipid Profile (Completed)   CMP14+EGFR (Completed)   Vitamin D  deficiency   Recheck vitamin D  levels.      Relevant Orders   Vitamin D  (25 hydroxy) (Completed)    Return in about 6 months (around 03/01/2025) for chronic follow-up with PCP.    Leita Longs, FNP

## 2024-09-02 LAB — CMP14+EGFR
ALT: 21 IU/L (ref 0–44)
AST: 16 IU/L (ref 0–40)
Albumin: 4.2 g/dL (ref 3.9–4.9)
Alkaline Phosphatase: 58 IU/L (ref 47–123)
BUN/Creatinine Ratio: 17 (ref 10–24)
BUN: 25 mg/dL (ref 8–27)
Bilirubin Total: 0.3 mg/dL (ref 0.0–1.2)
CO2: 21 mmol/L (ref 20–29)
Calcium: 10.2 mg/dL (ref 8.6–10.2)
Chloride: 102 mmol/L (ref 96–106)
Creatinine, Ser: 1.45 mg/dL — ABNORMAL HIGH (ref 0.76–1.27)
Globulin, Total: 2.3 g/dL (ref 1.5–4.5)
Glucose: 110 mg/dL — ABNORMAL HIGH (ref 70–99)
Potassium: 5.2 mmol/L (ref 3.5–5.2)
Sodium: 139 mmol/L (ref 134–144)
Total Protein: 6.5 g/dL (ref 6.0–8.5)
eGFR: 54 mL/min/1.73 — ABNORMAL LOW (ref >59)

## 2024-09-02 LAB — LIPID PANEL
Chol/HDL Ratio: 4.4 ratio (ref 0.0–5.0)
Cholesterol, Total: 132 mg/dL (ref 100–199)
HDL: 30 mg/dL — ABNORMAL LOW (ref >39)
LDL Chol Calc (NIH): 59 mg/dL (ref 0–99)
Triglycerides: 272 mg/dL — ABNORMAL HIGH (ref 0–149)
VLDL Cholesterol Cal: 43 mg/dL — ABNORMAL HIGH (ref 5–40)

## 2024-09-02 LAB — CBC WITH DIFFERENTIAL/PLATELET
Basophils Absolute: 0.1 x10E3/uL (ref 0.0–0.2)
Basos: 1 %
EOS (ABSOLUTE): 0.3 x10E3/uL (ref 0.0–0.4)
Eos: 3 %
Hematocrit: 51.9 % — ABNORMAL HIGH (ref 37.5–51.0)
Hemoglobin: 16.3 g/dL (ref 13.0–17.7)
Immature Grans (Abs): 0 x10E3/uL (ref 0.0–0.1)
Immature Granulocytes: 0 %
Lymphocytes Absolute: 1.9 x10E3/uL (ref 0.7–3.1)
Lymphs: 22 %
MCH: 28.7 pg (ref 26.6–33.0)
MCHC: 31.4 g/dL — ABNORMAL LOW (ref 31.5–35.7)
MCV: 92 fL (ref 79–97)
Monocytes Absolute: 0.7 x10E3/uL (ref 0.1–0.9)
Monocytes: 8 %
Neutrophils Absolute: 6 x10E3/uL (ref 1.4–7.0)
Neutrophils: 66 %
Platelets: 123 x10E3/uL — ABNORMAL LOW (ref 150–450)
RBC: 5.67 x10E6/uL (ref 4.14–5.80)
RDW: 15 % (ref 11.6–15.4)
WBC: 9 x10E3/uL (ref 3.4–10.8)

## 2024-09-02 LAB — VITAMIN D 25 HYDROXY (VIT D DEFICIENCY, FRACTURES): Vit D, 25-Hydroxy: 34.4 ng/mL (ref 30.0–100.0)

## 2024-09-03 ENCOUNTER — Ambulatory Visit: Admitting: "Endocrinology

## 2024-09-04 ENCOUNTER — Other Ambulatory Visit: Payer: Self-pay | Admitting: Internal Medicine

## 2024-09-04 ENCOUNTER — Ambulatory Visit: Payer: Self-pay

## 2024-09-04 DIAGNOSIS — E559 Vitamin D deficiency, unspecified: Secondary | ICD-10-CM | POA: Insufficient documentation

## 2024-09-04 NOTE — Assessment & Plan Note (Signed)
 Recheck vitamin D  levels

## 2024-09-04 NOTE — Assessment & Plan Note (Signed)
 Intermittent stomach discomfort managed with pantoprazole . Prescription refill needed. - Refilled pantoprazole  prescription for 90-day supply.

## 2024-09-04 NOTE — Assessment & Plan Note (Signed)
 Blood pressure well-controlled. Occasional lightheadedness likely due to heat exposure.

## 2024-09-04 NOTE — Assessment & Plan Note (Signed)
-  Continue Crestor  and fenofibrate  - Recheck fasting labs today.

## 2024-09-06 ENCOUNTER — Emergency Department (HOSPITAL_COMMUNITY): Admitting: Certified Registered"

## 2024-09-06 ENCOUNTER — Other Ambulatory Visit: Payer: Self-pay

## 2024-09-06 ENCOUNTER — Other Ambulatory Visit: Payer: Self-pay | Admitting: Family Medicine

## 2024-09-06 ENCOUNTER — Emergency Department (HOSPITAL_COMMUNITY)

## 2024-09-06 ENCOUNTER — Encounter (HOSPITAL_COMMUNITY)
Admission: EM | Disposition: A | Discharge status: Home / Self Care | Payer: Self-pay | Source: Home / Self Care | Attending: Emergency Medicine

## 2024-09-06 ENCOUNTER — Ambulatory Visit (HOSPITAL_COMMUNITY)
Admission: EM | Admit: 2024-09-06 | Discharge: 2024-09-06 | Disposition: A | Discharge status: Home / Self Care | Attending: Emergency Medicine | Admitting: Emergency Medicine

## 2024-09-06 ENCOUNTER — Encounter (HOSPITAL_COMMUNITY): Payer: Self-pay | Admitting: Certified Registered"

## 2024-09-06 DIAGNOSIS — I2510 Atherosclerotic heart disease of native coronary artery without angina pectoris: Secondary | ICD-10-CM | POA: Diagnosis not present

## 2024-09-06 DIAGNOSIS — Z833 Family history of diabetes mellitus: Secondary | ICD-10-CM | POA: Diagnosis not present

## 2024-09-06 DIAGNOSIS — N201 Calculus of ureter: Secondary | ICD-10-CM | POA: Diagnosis not present

## 2024-09-06 DIAGNOSIS — N189 Chronic kidney disease, unspecified: Secondary | ICD-10-CM | POA: Insufficient documentation

## 2024-09-06 DIAGNOSIS — Z743 Need for continuous supervision: Secondary | ICD-10-CM | POA: Diagnosis not present

## 2024-09-06 DIAGNOSIS — N179 Acute kidney failure, unspecified: Secondary | ICD-10-CM | POA: Insufficient documentation

## 2024-09-06 DIAGNOSIS — I129 Hypertensive chronic kidney disease with stage 1 through stage 4 chronic kidney disease, or unspecified chronic kidney disease: Secondary | ICD-10-CM | POA: Insufficient documentation

## 2024-09-06 DIAGNOSIS — Z794 Long term (current) use of insulin: Secondary | ICD-10-CM | POA: Insufficient documentation

## 2024-09-06 DIAGNOSIS — Z7984 Long term (current) use of oral hypoglycemic drugs: Secondary | ICD-10-CM | POA: Insufficient documentation

## 2024-09-06 DIAGNOSIS — I7143 Infrarenal abdominal aortic aneurysm, without rupture: Secondary | ICD-10-CM | POA: Diagnosis not present

## 2024-09-06 DIAGNOSIS — G4730 Sleep apnea, unspecified: Secondary | ICD-10-CM | POA: Diagnosis not present

## 2024-09-06 DIAGNOSIS — E119 Type 2 diabetes mellitus without complications: Secondary | ICD-10-CM

## 2024-09-06 DIAGNOSIS — I252 Old myocardial infarction: Secondary | ICD-10-CM | POA: Insufficient documentation

## 2024-09-06 DIAGNOSIS — Z8711 Personal history of peptic ulcer disease: Secondary | ICD-10-CM | POA: Diagnosis not present

## 2024-09-06 DIAGNOSIS — Z955 Presence of coronary angioplasty implant and graft: Secondary | ICD-10-CM | POA: Insufficient documentation

## 2024-09-06 DIAGNOSIS — K219 Gastro-esophageal reflux disease without esophagitis: Secondary | ICD-10-CM | POA: Diagnosis not present

## 2024-09-06 DIAGNOSIS — R1084 Generalized abdominal pain: Secondary | ICD-10-CM | POA: Diagnosis not present

## 2024-09-06 DIAGNOSIS — E1151 Type 2 diabetes mellitus with diabetic peripheral angiopathy without gangrene: Secondary | ICD-10-CM | POA: Insufficient documentation

## 2024-09-06 DIAGNOSIS — N132 Hydronephrosis with renal and ureteral calculous obstruction: Secondary | ICD-10-CM | POA: Diagnosis present

## 2024-09-06 DIAGNOSIS — I959 Hypotension, unspecified: Secondary | ICD-10-CM | POA: Diagnosis not present

## 2024-09-06 DIAGNOSIS — F17210 Nicotine dependence, cigarettes, uncomplicated: Secondary | ICD-10-CM | POA: Diagnosis not present

## 2024-09-06 DIAGNOSIS — E1122 Type 2 diabetes mellitus with diabetic chronic kidney disease: Secondary | ICD-10-CM | POA: Insufficient documentation

## 2024-09-06 DIAGNOSIS — N200 Calculus of kidney: Secondary | ICD-10-CM

## 2024-09-06 DIAGNOSIS — N210 Calculus in bladder: Secondary | ICD-10-CM | POA: Diagnosis not present

## 2024-09-06 LAB — CBC WITH DIFFERENTIAL/PLATELET
Abs Immature Granulocytes: 0.04 K/uL (ref 0.00–0.07)
Basophils Absolute: 0 K/uL (ref 0.0–0.1)
Basophils Relative: 0 %
Eosinophils Absolute: 0.1 K/uL (ref 0.0–0.5)
Eosinophils Relative: 1 %
HCT: 49.1 % (ref 39.0–52.0)
Hemoglobin: 16.3 g/dL (ref 13.0–17.0)
Immature Granulocytes: 0 %
Lymphocytes Relative: 8 %
Lymphs Abs: 0.8 K/uL (ref 0.7–4.0)
MCH: 29.9 pg (ref 26.0–34.0)
MCHC: 33.2 g/dL (ref 30.0–36.0)
MCV: 89.9 fL (ref 80.0–100.0)
Monocytes Absolute: 0.8 K/uL (ref 0.1–1.0)
Monocytes Relative: 8 %
Neutro Abs: 8.6 K/uL — ABNORMAL HIGH (ref 1.7–7.7)
Neutrophils Relative %: 83 %
Platelets: 93 K/uL — ABNORMAL LOW (ref 150–400)
RBC: 5.46 MIL/uL (ref 4.22–5.81)
RDW: 16.3 % — ABNORMAL HIGH (ref 11.5–15.5)
WBC: 10.4 K/uL (ref 4.0–10.5)
nRBC: 0 % (ref 0.0–0.2)

## 2024-09-06 LAB — URINALYSIS, ROUTINE W REFLEX MICROSCOPIC
Bacteria, UA: NONE SEEN
Bilirubin Urine: NEGATIVE
Glucose, UA: >=500 mg/dL — AB
Ketones, ur: NEGATIVE mg/dL
Nitrite: NEGATIVE
Protein, ur: 30 mg/dL — AB
Specific Gravity, Urine: 1.009 (ref 1.005–1.030)
pH: 5 (ref 5.0–8.0)

## 2024-09-06 LAB — GLUCOSE, CAPILLARY: Glucose-Capillary: 97 mg/dL (ref 70–99)

## 2024-09-06 LAB — COMPREHENSIVE METABOLIC PANEL WITH GFR
ALT: 19 U/L (ref 0–44)
AST: 21 U/L (ref 15–41)
Albumin: 3.9 g/dL (ref 3.5–5.0)
Alkaline Phosphatase: 56 U/L (ref 38–126)
Anion gap: 16 — ABNORMAL HIGH (ref 5–15)
BUN: 52 mg/dL — ABNORMAL HIGH (ref 8–23)
CO2: 21 mmol/L — ABNORMAL LOW (ref 22–32)
Calcium: 9.5 mg/dL (ref 8.9–10.3)
Chloride: 102 mmol/L (ref 98–111)
Creatinine, Ser: 4.46 mg/dL — ABNORMAL HIGH (ref 0.61–1.24)
GFR, Estimated: 14 mL/min — ABNORMAL LOW (ref >60)
Glucose, Bld: 122 mg/dL — ABNORMAL HIGH (ref 70–99)
Potassium: 5 mmol/L (ref 3.5–5.1)
Sodium: 139 mmol/L (ref 135–145)
Total Bilirubin: 0.3 mg/dL (ref 0.0–1.2)
Total Protein: 6.5 g/dL (ref 6.5–8.1)

## 2024-09-06 LAB — TYPE AND SCREEN
ABO/RH(D): O POS
Antibody Screen: NEGATIVE

## 2024-09-06 SURGERY — CYSTOSCOPY, WITH RETROGRADE PYELOGRAM AND URETERAL STENT INSERTION
Anesthesia: General | Site: Ureter | Laterality: Left

## 2024-09-06 MED ORDER — ACETAMINOPHEN 10 MG/ML IV SOLN
1000.0000 mg | Freq: Once | INTRAVENOUS | Status: DC | PRN
Start: 2024-09-06 — End: 2024-09-07
  Administered 2024-09-06: 1000 mg via INTRAVENOUS

## 2024-09-06 MED ORDER — EPHEDRINE 5 MG/ML INJ
INTRAVENOUS | Status: AC
  Filled 2024-09-06: qty 10

## 2024-09-06 MED ORDER — SENNOSIDES-DOCUSATE SODIUM 8.6-50 MG PO TABS: 1.0000 | ORAL_TABLET | Freq: Two times a day (BID) | ORAL | 0 refills | Status: AC

## 2024-09-06 MED ORDER — SODIUM CHLORIDE 0.9 % IV SOLN
2.0000 g | Freq: Once | INTRAVENOUS | Status: AC
  Administered 2024-09-06: 2 g via INTRAVENOUS
  Filled 2024-09-06: qty 20

## 2024-09-06 MED ORDER — PROPOFOL 10 MG/ML IV BOLUS
INTRAVENOUS | Status: AC
  Filled 2024-09-06: qty 20

## 2024-09-06 MED ORDER — PROPOFOL 10 MG/ML IV BOLUS
INTRAVENOUS | Status: DC | PRN
  Administered 2024-09-06: 140 mg via INTRAVENOUS

## 2024-09-06 MED ORDER — FENTANYL CITRATE (PF) 100 MCG/2ML IJ SOLN
INTRAMUSCULAR | Status: AC
  Filled 2024-09-06: qty 2

## 2024-09-06 MED ORDER — DEXTROSE 50 % IV SOLN
25.0000 mL | Freq: Once | INTRAVENOUS | Status: AC
  Administered 2024-09-06: 25 mL via INTRAVENOUS
  Filled 2024-09-06: qty 50

## 2024-09-06 MED ORDER — SODIUM CHLORIDE 0.9 % IV BOLUS
1000.0000 mL | Freq: Once | INTRAVENOUS | Status: AC
  Administered 2024-09-06: 1000 mL via INTRAVENOUS

## 2024-09-06 MED ORDER — LIDOCAINE HCL (PF) 2 % IJ SOLN
INTRAMUSCULAR | Status: AC
  Filled 2024-09-06: qty 5

## 2024-09-06 MED ORDER — CEPHALEXIN 500 MG PO CAPS: 500.0000 mg | ORAL_CAPSULE | Freq: Two times a day (BID) | ORAL | 0 refills | Status: AC

## 2024-09-06 MED ORDER — FENTANYL CITRATE (PF) 100 MCG/2ML IJ SOLN
INTRAMUSCULAR | Status: DC | PRN
  Administered 2024-09-06: 25 ug via INTRAVENOUS
  Administered 2024-09-06: 50 ug via INTRAVENOUS
  Administered 2024-09-06: 25 ug via INTRAVENOUS

## 2024-09-06 MED ORDER — IOHEXOL 300 MG/ML  SOLN
INTRAMUSCULAR | Status: DC | PRN
  Administered 2024-09-06: 10 mL

## 2024-09-06 MED ORDER — ONDANSETRON HCL 4 MG/2ML IJ SOLN
INTRAMUSCULAR | Status: DC | PRN
  Administered 2024-09-06: 4 mg via INTRAVENOUS

## 2024-09-06 MED ORDER — ONDANSETRON HCL 4 MG/2ML IJ SOLN: 4.0000 mg | Freq: Once | INTRAMUSCULAR | Status: DC | PRN

## 2024-09-06 MED ORDER — MIDAZOLAM HCL (PF) 2 MG/2ML IJ SOLN
INTRAMUSCULAR | Status: DC | PRN
  Administered 2024-09-06: 2 mg via INTRAVENOUS

## 2024-09-06 MED ORDER — LACTATED RINGERS IV SOLN: INTRAVENOUS | Status: DC | PRN

## 2024-09-06 MED ORDER — ACETAMINOPHEN 10 MG/ML IV SOLN
INTRAVENOUS | Status: AC
  Filled 2024-09-06: qty 100

## 2024-09-06 MED ORDER — DEXAMETHASONE SOD PHOSPHATE PF 10 MG/ML IJ SOLN
INTRAMUSCULAR | Status: DC | PRN
  Administered 2024-09-06: 5 mg via INTRAVENOUS

## 2024-09-06 MED ORDER — OXYCODONE HCL 5 MG/5ML PO SOLN: 5.0000 mg | Freq: Once | ORAL | Status: DC | PRN

## 2024-09-06 MED ORDER — EPHEDRINE 5 MG/ML INJ
INTRAVENOUS | Status: AC
  Filled 2024-09-06: qty 5

## 2024-09-06 MED ORDER — PHENYLEPHRINE 80 MCG/ML (10ML) SYRINGE FOR IV PUSH (FOR BLOOD PRESSURE SUPPORT)
PREFILLED_SYRINGE | INTRAVENOUS | Status: DC | PRN
  Administered 2024-09-06: 80 ug via INTRAVENOUS
  Administered 2024-09-06: 160 ug via INTRAVENOUS

## 2024-09-06 MED ORDER — HYDROMORPHONE HCL 1 MG/ML IJ SOLN: 0.2500 mg | INTRAMUSCULAR | Status: DC | PRN

## 2024-09-06 MED ORDER — AMISULPRIDE (ANTIEMETIC) 5 MG/2ML IV SOLN: 10.0000 mg | Freq: Once | INTRAVENOUS | Status: DC | PRN

## 2024-09-06 MED ORDER — ONDANSETRON HCL 4 MG/2ML IJ SOLN
INTRAMUSCULAR | Status: AC
  Filled 2024-09-06: qty 2

## 2024-09-06 MED ORDER — SODIUM CHLORIDE 0.9 % IR SOLN
Status: DC | PRN
  Administered 2024-09-06: 3000 mL via INTRAVESICAL

## 2024-09-06 MED ORDER — LIDOCAINE HCL (PF) 2 % IJ SOLN
INTRAMUSCULAR | Status: DC | PRN
  Administered 2024-09-06: 100 mg via INTRADERMAL

## 2024-09-06 MED ORDER — PHENYLEPHRINE 80 MCG/ML (10ML) SYRINGE FOR IV PUSH (FOR BLOOD PRESSURE SUPPORT)
PREFILLED_SYRINGE | INTRAVENOUS | Status: AC
  Filled 2024-09-06: qty 10

## 2024-09-06 MED ORDER — DEXMEDETOMIDINE HCL IN NACL 80 MCG/20ML IV SOLN
INTRAVENOUS | Status: DC | PRN
  Administered 2024-09-06: 8 ug via INTRAVENOUS

## 2024-09-06 MED ORDER — MIDAZOLAM HCL 2 MG/2ML IJ SOLN
INTRAMUSCULAR | Status: AC
  Filled 2024-09-06: qty 2

## 2024-09-06 MED ORDER — OXYCODONE HCL 5 MG PO TABS: 5.0000 mg | ORAL_TABLET | Freq: Once | ORAL | Status: DC | PRN

## 2024-09-06 MED ORDER — EPHEDRINE SULFATE (PRESSORS) 25 MG/5ML IV SOSY
PREFILLED_SYRINGE | INTRAVENOUS | Status: DC | PRN
  Administered 2024-09-06: 10 mg via INTRAVENOUS

## 2024-09-06 MED ORDER — OXYCODONE-ACETAMINOPHEN 5-325 MG PO TABS
1.0000 | ORAL_TABLET | Freq: Four times a day (QID) | ORAL | 0 refills | Status: AC | PRN
Start: 2024-09-06 — End: 2025-09-06

## 2024-09-06 SURGICAL SUPPLY — 18 items
BAG URO CATCHER STRL LF (MISCELLANEOUS) ×2 IMPLANT
BASKET LASER NITINOL 1.9FR (BASKET) IMPLANT
CATH URETL OPEN END 6FR 70 (CATHETERS) ×2 IMPLANT
CLOTH BEACON ORANGE TIMEOUT ST (SAFETY) ×2 IMPLANT
GLOVE SURG LX STRL 7.5 STRW (GLOVE) ×2 IMPLANT
GOWN STRL REUS W/ TWL XL LVL3 (GOWN DISPOSABLE) ×2 IMPLANT
GUIDEWIRE ANG ZIPWIRE 038X150 (WIRE) ×2 IMPLANT
GUIDEWIRE STR DUAL SENSOR (WIRE) ×2 IMPLANT
KIT TURNOVER KIT A (KITS) ×2 IMPLANT
MANIFOLD NEPTUNE II (INSTRUMENTS) ×2 IMPLANT
PACK CYSTO (CUSTOM PROCEDURE TRAY) ×2 IMPLANT
SHEATH NAVIGATOR HD 11/13X28 (SHEATH) IMPLANT
SHEATH NAVIGATOR HD 11/13X36 (SHEATH) IMPLANT
STENT POLARIS 5FRX26 (STENTS) IMPLANT
TRACTIP FLEXIVA PULS ID 200XHI (Laser) IMPLANT
TUBE PU 8FR 16IN ENFIT (TUBING) ×2 IMPLANT
TUBING CONNECTING 10 (TUBING) ×2 IMPLANT
TUBING UROLOGY SET (TUBING) ×2 IMPLANT

## 2024-09-06 NOTE — ED Provider Notes (Signed)
 [place] EMERGENCY DEPARTMENT AT [facility] Provider Note   CSN: [id number] Arrival date & time: 09/06/24  9243     Patient presents with: Flank Pain and Groin Pain   Clayton Peterson is a 63 y.o. male.   Patient presents with left lower quadrant abdominal discomfort.  Has been going on for a couple days.  Patient also states that he had abdominal discomfort in 2009 when he had a ruptured aneurysm that was similar to this.  He had surgery on his abdominal aortic aneurysm and is being followed by vascular surgery who were supposed to see him Tuesday in the office because they are following a fusiform infrarenal abdominal aortic aneurysm.  The history is provided by the patient and medical records. No language interpreter was used.  Flank Pain This is a new problem. The current episode started 2 days ago. The problem occurs constantly. The problem has not changed since onset.Pertinent negatives include no chest pain, no abdominal pain and no headaches. Nothing relieves the symptoms. He has tried nothing for the symptoms.  Groin Pain Pertinent negatives include no chest pain, no abdominal pain and no headaches.       Prior to Admission medications   Medication Sig Start Date End Date Taking? Authorizing Provider  acetaminophen  (TYLENOL ) 650 MG CR tablet Take 1,300 mg by mouth every 8 (eight) hours as needed for pain.    [provider]  aspirin  81 MG EC tablet Take 81 mg by mouth daily.    [provider]  atorvastatin  (LIPITOR) 80 MG tablet Take 1 tablet (80 mg total) by mouth daily. 04/03/24   Darryle Thom CROME, PA-C  Blood Pressure Monitoring (BLOOD PRESSURE MONITOR 3) DEVI Check blood pressure once daily 1 hour after taking blood pressure medication 04/21/24   Tobie Suzzane POUR, MD  Cholecalciferol (VITAMIN D3) 125 MCG (5000 UT) TABS Take 1 tablet by mouth daily.    [provider]  Continuous Glucose Receiver (FREESTYLE LIBRE 3 READER) DEVI 1 each  by Does not apply route daily. 01/22/24   Del Wilhelmena Lloyd Sola, FNP  Continuous Glucose Sensor (FREESTYLE LIBRE 3 PLUS SENSOR) MISC USE FOR CONTINUOUS GLUCOSE MONITORING. CHANGE SENSOR EVERY 15 DAYS 08/11/24   Del Orbe Polanco, Iliana, FNP  dapagliflozin  propanediol (FARXIGA ) 10 MG TABS tablet Take 1 tablet (10 mg total) by mouth daily before breakfast. 04/04/24   Darryle Thom CROME, PA-C  fenofibrate  54 MG tablet Take 1 tablet (54 mg total) by mouth daily. 07/21/24   Mona Vinie BROCKS, MD  gabapentin  (NEURONTIN ) 400 MG capsule Take 1 capsule by mouth twice daily 08/29/24   Del Orbe Polanco, Iliana, FNP  glipiZIDE  (GLUCOTROL  XL) 5 MG 24 hr tablet Take 1 tablet by mouth once daily with breakfast 04/09/24   Nida, Gebreselassie W, MD  insulin  glargine (LANTUS ) 100 UNIT/ML injection Inject 0.3 mLs (30 Units total) into the skin at bedtime. 06/24/24   Nida, Gebreselassie W, MD  lisinopril  (ZESTRIL ) 20 MG tablet Take 1 tablet by mouth once daily 09/05/24   Hilty, Vinie BROCKS, MD  metoprolol  succinate (TOPROL -XL) 50 MG 24 hr tablet Take 1 tablet (50 mg total) by mouth daily. Take with or immediately following a meal. 04/03/24 09/01/24  Darryle Thom L, PA-C  nitroGLYCERIN  (NITROSTAT ) 0.4 MG SL tablet Place 1 tablet (0.4 mg total) under the tongue every 5 (five) minutes as needed for chest pain. 12/28/22   Del Orbe Polanco, Iliana, FNP  Omega-3 Fatty Acids (OMEGA 3 PO) Take  2,000 mg by mouth 2 (two) times daily.    [provider]  [facility] ULTRA test strip USE 1 STRIP TO CHECK GLUCOSE THREE TIMES DAILY 03/06/23   [provider]  pantoprazole  (PROTONIX ) 40 MG tablet Take 1 tablet (40 mg total) by mouth 2 (two) times daily before a meal. 09/01/24   Huenink, Leita, FNP  RELION PEN NEEDLE 31G/8MM 31G X 8 MM MISC USE 1 PEN NEEDLE TWICE DAILY FOR DIABETES 07/28/24   Del Wilhelmena Lloyd Sola, FNP    Allergies: Bee venom, Coconut (cocos nucifera), and Coconut oil    Review of Systems  Constitutional:  Negative  for appetite change and fatigue.  HENT:  Negative for congestion, ear discharge and sinus pressure.   Eyes:  Negative for discharge.  Respiratory:  Negative for cough.   Cardiovascular:  Negative for chest pain.  Gastrointestinal:  Negative for abdominal pain and diarrhea.  Genitourinary:  Positive for flank pain. Negative for frequency and hematuria.  Musculoskeletal:  Negative for back pain.  Skin:  Negative for rash.  Neurological:  Negative for seizures and headaches.  Psychiatric/Behavioral:  Negative for hallucinations.     Updated Vital Signs BP 113/60   Pulse 60   Temp (!) 97.5 F (36.4 C)   Resp 18   Ht 5' 9 (1.753 m)   Wt 92.5 kg   SpO2 92%   BMI 30.13 kg/m   Physical Exam Vitals and nursing note reviewed.  Constitutional:      Appearance: He is well-developed.  HENT:     Head: Normocephalic.     Nose: Nose normal.  Eyes:     General: No scleral icterus.    Conjunctiva/sclera: Conjunctivae normal.  Neck:     Thyroid: No thyromegaly.  Cardiovascular:     Rate and Rhythm: Normal rate and regular rhythm.     Heart sounds: No murmur heard.    No friction rub. No gallop.  Pulmonary:     Breath sounds: No stridor. No wheezing or rales.  Chest:     Chest wall: No tenderness.  Abdominal:     General: There is no distension.     Tenderness: There is abdominal tenderness. There is no rebound.     Comments: Tender left lower quadrant  Musculoskeletal:        General: Normal range of motion.     Cervical back: Neck supple.  Lymphadenopathy:     Cervical: No cervical adenopathy.  Skin:    Findings: No erythema or rash.  Neurological:     Mental Status: He is alert and oriented to person, place, and time.     Motor: No abnormal muscle tone.     Coordination: Coordination normal.  Psychiatric:        Behavior: Behavior normal.   CRITICAL CARE Performed by: Fairy Sermon Total critical care time: 60 minutes Critical care time was exclusive of separately  billable procedures and treating other patients. Critical care was necessary to treat or prevent imminent or life-threatening deterioration. Critical care was time spent personally by me on the following activities: development of treatment plan with patient and/or surrogate as well as nursing, discussions with consultants, evaluation of patient's response to treatment, examination of patient, obtaining history from patient or surrogate, ordering and performing treatments and interventions, ordering and review of laboratory studies, ordering and review of radiographic studies, pulse oximetry and re-evaluation of patient's condition.  Patient with worsening renal function.  His creatinine has gone up to 4.5 in the  last 4 days.  His creatinine function was normal earlier this week.  CT scan shows a 4 mm stone that he might have passed.  I spoke with Dr. Alvaro of urology and he is not certain that the stone is completely passed and wanted the patient transferred to [facility] emergency department.  He will evaluate the patient in the emergency department and probably take him to the operating room.  I contacted vascular surgery so they could comment on the aneurysm but they were in surgery at the time and will call after they finished her case.  I spoke again with radiology and they do not feel like the aneurysm is leaking or causing any problem at this time although this is a noncontrast study. (all labs ordered are listed, but only abnormal results are displayed) Labs Reviewed  URINALYSIS, ROUTINE W REFLEX MICROSCOPIC - Abnormal; Notable for the following components:      Result Value   APPearance HAZY (*)    Glucose, UA >=500 (*)    Hgb urine dipstick MODERATE (*)    Protein, ur 30 (*)    Leukocytes,Ua MODERATE (*)    All other components within normal limits  CBC WITH DIFFERENTIAL/PLATELET - Abnormal; Notable for the following components:   RDW 16.3 (*)    Platelets 93 (*)    Neutro Abs 8.6 (*)     All other components within normal limits  COMPREHENSIVE METABOLIC PANEL WITH GFR - Abnormal; Notable for the following components:   CO2 21 (*)    Glucose, Bld 122 (*)    BUN 52 (*)    Creatinine, Ser 4.46 (*)    GFR, Estimated 14 (*)    Anion gap 16 (*)    All other components within normal limits  URINE CULTURE  TYPE AND SCREEN    EKG: None  Radiology: CT Renal Stone Study Result Date: 09/06/2024 EXAM: CT ABDOMEN AND PELVIS WITHOUT CONTRAST 09/06/2024 10:39:17 AM TECHNIQUE: CT of the abdomen and pelvis was performed without the administration of intravenous contrast. Multiplanar reformatted images are provided for review. Automated exposure control, iterative reconstruction, and/or weight-based adjustment of the mA/kV was utilized to reduce the radiation dose to as low as reasonably achievable. COMPARISON: CT of the abdomen and pelvis dated 02/18/2023. CLINICAL HISTORY: Abdominal/flank pain, stone suspected. FINDINGS: LOWER CHEST: Dependent atelectasis within the lower lobes bilaterally. LIVER: The liver is unremarkable. GALLBLADDER AND BILE DUCTS: Gallbladder is unremarkable. No biliary ductal dilatation. SPLEEN: No acute abnormality. PANCREAS: No acute abnormality. ADRENAL GLANDS: No acute abnormality. KIDNEYS, URETERS AND BLADDER: The right kidney is markedly atrophic. The left kidney demonstrates moderate hydronephrosis and perinephric fatty stranding. There is a nonobstructive calculus within the superior pole of the left kidney, measuring approximately 6 mm in diameter. A 4 mm round calculus is present at the left ureterovesical junction, which appears to be within the bladder lumen. GI AND BOWEL: Stomach demonstrates no acute abnormality. There is no bowel obstruction. PERITONEUM AND RETROPERITONEUM: No ascites. No free air. VASCULATURE: There is a fusiform infrarenal abdominal aorta aneurysm measuring approximately 5.6 x 4.4 cm in cross-sectional diameter. There is also a saccular  component present posterolaterally on the left, measuring approximately 1 cm in diameter. An aortobiiliac artery stent graft is faintly visualized. 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 LYMPH NODES: No lymphadenopathy. REPRODUCTIVE ORGANS: No acute abnormality. BONES AND SOFT TISSUES: No acute osseous abnormality. No focal soft tissue abnormality. IMPRESSION:  1. 4 mm round calculus at the left ureterovesical junction, appearing to be within the bladder lumen 2. Moderate left hydroureteronephrosis and perinephric fatty stranding 3. Nonobstructive 6 mm calculus within the superior pole of the left kidney 4. Markedly atrophic right kidney 5. Fusiform infrarenal abdominal aortic aneurysm measuring approximately 5.6 x 4.4 cm with a 1 cm saccular component posterolaterally on the left; recommend establishing or continuing care with a vascular specialist. For AAA size 5.05.4 cm, CT or MRI in 6 months is recommended per Society for Vascular Surgery; note that the presence of a saccular component warrants vascular specialist management 6. Aortobiiliac artery stent graft faintly visualized 7. 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 Radiology: ordered.   Patient with worsening kidney function with unobstructed kidney stone.  He is going to [facility] emergency department to be seen by urology.  Dr. Gorge is the excepting doctor.  He will need to be  admitted to medicine and vascular surgery will review the CT scan.  I will leave a note if they call me with their finding     Final diagnoses:  None    ED Discharge Orders     None          Suzette Pac, MD 09/06/24 1233

## 2024-09-06 NOTE — ED Notes (Signed)
 [facility] at bedside

## 2024-09-06 NOTE — Anesthesia Preprocedure Evaluation (Addendum)
 Anesthesia Evaluation  Patient identified by MRN, date of birth, ID band Patient awake    Reviewed: Allergy & Precautions, NPO status , Patient's Chart, lab work & pertinent test results  History of Anesthesia Complications Negative for: history of anesthetic complications  Airway Mallampati: II  TM Distance: >3 FB Neck ROM: Full    Dental no notable dental hx. (+) Missing, Loose,    Pulmonary sleep apnea , Current SmokerPatient did not abstain from smoking.   Pulmonary exam normal breath sounds clear to auscultation       Cardiovascular hypertension, (-) angina + CAD, + Past MI (2012), + Cardiac Stents and + Peripheral Vascular Disease  Normal cardiovascular exam Rhythm:Regular Rate:Normal  2024 TTE  1. Left ventricular ejection fraction, by estimation, is 55 to 60%. Left  ventricular ejection fraction by PLAX is 58 %. The left ventricle has  normal function. The left ventricle has no regional wall motion  abnormalities. codominant mitral inflow,  suggesting borderline diastolic dysfunciton.   2. Right ventricular systolic function is normal. The right ventricular  size is normal. Tricuspid regurgitation signal is inadequate for assessing  PA pressure.   3. The mitral valve is grossly normal. Trivial mitral valve  regurgitation.   4. The aortic valve is tricuspid. Aortic valve regurgitation is not  visualized.   5. The inferior vena cava is normal in size with greater than 50%  respiratory variability, suggesting right atrial pressure of 3 mmHg.     Neuro/Psych    GI/Hepatic PUD,GERD  Medicated and Controlled,,  Endo/Other  diabetes, Type 2    Renal/GU ARF and CRFRenal diseaseAtrophic Right Kidney -  Lab Results      Component                Value               Date                      NA                       139                 09/06/2024                CL                       102                 09/06/2024                 K                        5.0                 09/06/2024                CO2                      21 (L)              09/06/2024                BUN                      52 (H)  09/06/2024                CREATININE               4.46 (H)            09/06/2024                GFRNONAA                 14 (L)              09/06/2024                CALCIUM                   9.5                 09/06/2024                PHOS                     3.4                 02/15/2023                ALBUMIN                   3.9                 09/06/2024                GLUCOSE                  122 (H)             09/06/2024                Musculoskeletal   Abdominal   Peds  Hematology Lab Results      Component                Value               Date                      WBC                      10.4                09/06/2024                HGB                      16.3                09/06/2024                HCT                      49.1                09/06/2024                MCV                      89.9                09/06/2024                PLT  93 (L)              09/06/2024              Anesthesia Other Findings   Reproductive/Obstetrics                              Anesthesia Physical Anesthesia Plan  ASA: 3 and emergent  Anesthesia Plan: General   Post-op Pain Management: Ofirmev  IV (intra-op)*   Induction: Intravenous  PONV Risk Score and Plan: 2 and Treatment may vary due to age or medical condition, Ondansetron , Dexamethasone  and Midazolam   Airway Management Planned: LMA  Additional Equipment: None  Intra-op Plan:   Post-operative Plan: Extubation in OR  Informed Consent: I have reviewed the patients History and Physical, chart, labs and discussed the procedure including the risks, benefits and alternatives for the proposed anesthesia with the patient or authorized representative who has indicated  his/her understanding and acceptance.     Dental advisory given  Plan Discussed with: CRNA and Surgeon  Anesthesia Plan Comments:          Anesthesia Quick Evaluation

## 2024-09-06 NOTE — ED Triage Notes (Signed)
 Pt c/o left flank pain that radiates to his groin since Thurs. Pt denies swelling, bruising or heavy lifting.

## 2024-09-06 NOTE — H&P (Signed)
 Clayton Peterson is an 63 y.o. male.    Chief Complaint: LEFT Ureteral Stone, Acute on Chronic Renal Failure / Atrophic Right Kidney  HPI:   1 - LEFT Ureteral Stone   - 4mm left UVJ stone with moderate hydro on ER CT 09/06/24 on eval flank pain. Only additional stone appears small and parenchymal.   2 - Acute on Chronic Renal Failure / Atrophic Right Kidney - Cr 4's with K 5, normal volume status on ER labs. Baseline Cr 1.8, atrophic Rt kidney.  Today Clayton Peterson is seen for emergent evaluation of above. Some coffee with milk at about 5:30 AM.  Past Medical History:  Diagnosis Date   AAA (abdominal aortic aneurysm)    Coronary artery disease    Diabetes mellitus without complication (HCC)    Heart attack (HCC)    High cholesterol    Hypertension     Past Surgical History:  Procedure Laterality Date   ABDOMINAL AORTIC ANEURYSM REPAIR     BACK SURGERY     x2   BIOPSY  06/03/2021   Procedure: BIOPSY;  Surgeon: Cindie Carlin POUR, DO;  [facility]: [facility];  Service: Endoscopy;;   BIOPSY  02/09/2023   Procedure: BIOPSY;  Surgeon: Leigh Elspeth SQUIBB, MD;  Location: [facility];  Service: Gastroenterology;;   CARDIAC CATHETERIZATION     stent placed on right side ofo heart   COLONOSCOPY WITH PROPOFOL  N/A 06/03/2021   Surgeon: Cindie Carlin POUR, DO; Nonbleeding internal hemorrhoids, 6 mm polyp in sigmoid colon, 2 mm polyp in the ascending colon, congested and erythematous mucosa in the cecum biopsied.  Pathology revealed 2 tubular adenomas, cecal biopsy with polypoid colonic mucosa with ectatic vascular channels with small vascular malformation not ruled out. Repeat in 5 years.   CYST ENTEROSTOMY N/A 02/13/2023   Procedure: CYST GASTROSTOMY;  Surgeon: Wilhelmenia Aloha Raddle., MD;  Location: [facility] ENDOSCOPY;  Service: Gastroenterology;  Laterality: N/A;   CYST ENTEROSTOMY  04/02/2023   Procedure: CYST NECROSECTOMY;  Surgeon: Wilhelmenia Aloha Raddle., MD;  Location: [facility] ENDOSCOPY;  Service:  Gastroenterology;;   ESOPHAGOGASTRODUODENOSCOPY N/A 02/11/2023   Procedure: ESOPHAGOGASTRODUODENOSCOPY (EGD);  Surgeon: Rollin Dover, MD;  Location: [facility] ENDOSCOPY;  Service: Gastroenterology;  Laterality: N/A;   ESOPHAGOGASTRODUODENOSCOPY (EGD) WITH PROPOFOL  N/A 06/03/2021   Surgeon: Cindie Carlin POUR, DO; Gastritis biopsied, nonobstructing, nonbleeding duodenal ulcer with no stigmata of bleeding, NSAID induced etiology.  Pathology with chronic gastritis, negative for H. pylori.   ESOPHAGOGASTRODUODENOSCOPY (EGD) WITH PROPOFOL  N/A 02/09/2023   Procedure: ESOPHAGOGASTRODUODENOSCOPY (EGD) WITH PROPOFOL ;  Surgeon: Leigh Elspeth SQUIBB, MD;  Location: [facility] ENDOSCOPY;  Service: Gastroenterology;  Laterality: N/A;   ESOPHAGOGASTRODUODENOSCOPY (EGD) WITH PROPOFOL  N/A 02/13/2023   Procedure: ESOPHAGOGASTRODUODENOSCOPY (EGD) WITH PROPOFOL ;  Surgeon: Wilhelmenia Aloha Raddle., MD;  Location: [facility] ENDOSCOPY;  Service: Gastroenterology;  Laterality: N/A;   ESOPHAGOGASTRODUODENOSCOPY (EGD) WITH PROPOFOL  N/A 02/22/2023   Procedure: ESOPHAGOGASTRODUODENOSCOPY (EGD) WITH PROPOFOL ;  Surgeon: Wilhelmenia Aloha Raddle., MD;  Location: [facility];  Service: Gastroenterology;  Laterality: N/A;   ESOPHAGOGASTRODUODENOSCOPY (EGD) WITH PROPOFOL  N/A 04/02/2023   Procedure: ESOPHAGOGASTRODUODENOSCOPY (EGD) WITH PROPOFOL ;  Surgeon: Wilhelmenia Aloha Raddle., MD;  Location: [facility] END[facility]COPY;  Service: Gastroenterology;  Laterality: N/A;   EUS Left 02/11/2023   Procedure: UPPER ENDOSCOPIC ULTRASOUND (EUS) LINEAR;  Surgeon: Rollin Dover, MD;  Location: [facility] ENDOSCOPY;  Service: Gastroenterology;  Laterality: Left;   EUS N/A 02/13/2023   Procedure: UPPER ENDOSCOPIC ULTRASOUND (EUS) LINEAR;  Surgeon: Wilhelmenia Aloha Raddle., MD;  Location: [facility] ENDOSCOPY;  Service: Gastroenterology;  Laterality: N/A;  KNEE SURGERY Left    LEFT HEART CATH AND CORONARY ANGIOGRAPHY N/A 02/07/2023   Procedure: LEFT HEART CATH AND CORONARY ANGIOGRAPHY;  Surgeon: Verlin Lonni BIRCH, MD;  Location: [facility];  Service: Cardiovascular;  Laterality: N/A;   PANCREATIC STENT PLACEMENT  02/13/2023   Procedure: PANCREATIC STENT PLACEMENT;  Surgeon: Wilhelmenia Aloha Raddle., MD;  Location: [facility] ENDOSCOPY;  Service: Gastroenterology;;   PANCREATIC STENT PLACEMENT  02/22/2023   Procedure: PANCREATIC STENT PLACEMENT;  Surgeon: Wilhelmenia Aloha Raddle., MD;  Location: [facility];  Service: Gastroenterology;;   PARTIAL COLECTOMY     POLYPECTOMY  06/03/2021   Procedure: POLYPECTOMY INTESTINAL;  Surgeon: Cindie Carlin POUR, DO;  [facility];  Service: Endoscopy;;   STENT REMOVAL  02/22/2023   Procedure: STENT REMOVAL;  Surgeon: Wilhelmenia Aloha Raddle., MD;  Location: [facility]OSCOPY;  Service: Gastroenterology;;   CLEDA REMOVAL  04/02/2023   Procedure: STENT REMOVAL;  Surgeon: Wilhelmenia Aloha Raddle., MD;  Location: [facility]OSCOPY;  Service: Gastroenterology;;    Family History  Problem Relation Age of Onset   Atrial fibrillation Mother    Alzheimer's disease Mother    Diabetes Brother    Alzheimer's disease Maternal Grandmother    Aneurysm Maternal Grandfather    Pancreatitis Neg Hx    Pancreatic cancer Neg Hx    Colon cancer Neg Hx    Social History:  reports that he has been smoking cigarettes. He started smoking about 48 years ago. He has a 48.9 pack-year smoking history. He has been exposed to tobacco smoke. He has never used smokeless tobacco. He reports that he does not currently use alcohol. He reports that he does not use drugs.  Allergies:  Allergies  Allergen Reactions   Bee Venom Anaphylaxis   Coconut (Cocos Nucifera) Anaphylaxis and Other (See Comments)    coconut allergenic extract   Coconut Oil Anaphylaxis    (Not in a hospital admission)   Results for orders placed or performed during the hospital encounter of 09/06/24 (from the past 48 hours)  CBC with Differential     Status: Abnormal   Collection Time: 09/06/24  8:28 AM  Result  Value Ref Range   WBC 10.4 4.0 - 10.5 K/uL   RBC 5.46 4.22 - 5.81 MIL/uL   Hemoglobin 16.3 13.0 - 17.0 g/dL   HCT 50.8 60.9 - 47.9 %   MCV 89.9 80.0 - 100.0 fL   MCH 29.9 26.0 - 34.0 pg   MCHC 33.2 30.0 - 36.0 g/dL   RDW 83.6 (H) 88.4 - 84.4 %   Platelets 93 (L) 150 - 400 K/uL    Comment: SPECIMEN CHECKED FOR CLOTS Immature Platelet Fraction may be clinically indicated, consider ordering this additional test [id number]    nRBC 0.0 0.0 - 0.2 %   Neutrophils Relative % 83 %   Neutro Abs 8.6 (H) 1.7 - 7.7 K/uL   Lymphocytes Relative 8 %   Lymphs Abs 0.8 0.7 - 4.0 K/uL   Monocytes Relative 8 %   Monocytes Absolute 0.8 0.1 - 1.0 K/uL   Eosinophils Relative 1 %   Eosinophils Absolute 0.1 0.0 - 0.5 K/uL   Basophils Relative 0 %   Basophils Absolute 0.0 0.0 - 0.1 K/uL   WBC Morphology MORPHOLOGY UNREMARKABLE    RBC Morphology MORPHOLOGY UNREMARKABLE    Smear Review See Note     Comment: PLATELET COUNT CONFIRMED BY SMEAR   Immature Granulocytes 0 %   Abs Immature Granulocytes 0.04 0.00 - 0.07 K/uL  Comment: Performed at [facility], [address], [place], [place] [address]  Comprehensive metabolic panel     Status: Abnormal   Collection Time: 09/06/24  8:28 AM  Result Value Ref Range   Sodium 139 135 - 145 mmol/L   Potassium 5.0 3.5 - 5.1 mmol/L   Chloride 102 98 - 111 mmol/L   CO2 21 (L) 22 - 32 mmol/L   Glucose, Bld 122 (H) 70 - 99 mg/dL    Comment: Glucose reference range applies only to samples taken after fasting for at least 8 hours.   BUN 52 (H) 8 - 23 mg/dL   Creatinine, Ser 5.53 (H) 0.61 - 1.24 mg/dL   Calcium  9.5 8.9 - 10.3 mg/dL   Total Protein 6.5 6.5 - 8.1 g/dL   Albumin  3.9 3.5 - 5.0 g/dL   AST 21 15 - 41 U/L   ALT 19 0 - 44 U/L   Alkaline Phosphatase 56 38 - 126 U/L   Total Bilirubin 0.3 0.0 - 1.2 mg/dL   GFR, Estimated 14 (L) >60 mL/min    Comment: (NOTE) Calculated using the CKD-EPI Creatinine Equation (2021)    Anion gap 16 (H) 5 - 15    Comment:  Performed at [facility], [address] [place], [place] [address]  Type and screen     Status: None   Collection Time: 09/06/24  9:21 AM  Result Value Ref Range   ABO/RH(D) O POS    Antibody Screen NEG    Sample Expiration      09/09/2024,2359 Performed at [facility], [address], [place], [place] [address]   Urinalysis, Routine w reflex microscopic -Urine, Clean Catch     Status: Abnormal   Collection Time: 09/06/24  9:49 AM  Result Value Ref Range   Color, Urine YELLOW YELLOW   APPearance HAZY (A) CLEAR   Specific Gravity, Urine 1.009 1.005 - 1.030   pH 5.0 5.0 - 8.0   Glucose, UA >=500 (A) NEGATIVE mg/dL   Hgb urine dipstick MODERATE (A) NEGATIVE   Bilirubin Urine NEGATIVE NEGATIVE   Ketones, ur NEGATIVE NEGATIVE mg/dL   Protein, ur 30 (A) NEGATIVE mg/dL   Nitrite NEGATIVE NEGATIVE   Leukocytes,Ua MODERATE (A) NEGATIVE   RBC / HPF 21-50 0 - 5 RBC/hpf   WBC, UA 21-50 0 - 5 WBC/hpf   Bacteria, UA NONE SEEN NONE SEEN   Squamous Epithelial / HPF 0-5 0 - 5 /HPF    Comment: Performed at [facility], [address], [place], [place] [address]   CT Renal Stone Study Result Date: 09/06/2024 EXAM: CT ABDOMEN AND PELVIS WITHOUT CONTRAST 09/06/2024 10:39:17 AM TECHNIQUE: CT of the abdomen and pelvis was performed without the administration of intravenous contrast. Multiplanar reformatted images are provided for review. Automated exposure control, iterative reconstruction, and/or weight-based adjustment of the mA/kV was utilized to reduce the radiation dose to as low as reasonably achievable. COMPARISON: CT of the abdomen and pelvis dated 02/18/2023. CLINICAL HISTORY: Abdominal/flank pain, stone suspected. FINDINGS: LOWER CHEST: Dependent atelectasis within the lower lobes bilaterally. LIVER: The liver is unremarkable. GALLBLADDER AND BILE DUCTS: Gallbladder is unremarkable. No biliary ductal dilatation. SPLEEN: No acute abnormality. PANCREAS: No acute abnormality. ADRENAL GLANDS: No  acute abnormality. KIDNEYS, URETERS AND BLADDER: The right kidney is markedly atrophic. The left kidney demonstrates moderate hydronephrosis and perinephric fatty stranding. There is a nonobstructive calculus within the superior pole of the left kidney, measuring approximately 6 mm in diameter. A 4 mm round calculus is  present at the left ureterovesical junction, which appears to be within the bladder lumen. GI AND BOWEL: Stomach demonstrates no acute abnormality. There is no bowel obstruction. PERITONEUM AND RETROPERITONEUM: No ascites. No free air. VASCULATURE: There is a fusiform infrarenal abdominal aorta aneurysm measuring approximately 5.6 x 4.4 cm in cross-sectional diameter. There is also a saccular component present posterolaterally on the left, measuring approximately 1 cm in diameter. An aortobiiliac artery stent graft is faintly visualized. 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 LYMPH NODES: No lymphadenopathy. REPRODUCTIVE ORGANS: No acute abnormality. BONES AND SOFT TISSUES: No acute osseous abnormality. No focal soft tissue abnormality. IMPRESSION: 1. 4 mm round calculus at the left ureterovesical junction, appearing to be within the bladder lumen 2. Moderate left hydroureteronephrosis and perinephric fatty stranding 3. Nonobstructive 6 mm calculus within the superior pole of the left kidney 4. Markedly atrophic right kidney 5. Fusiform infrarenal abdominal aortic aneurysm measuring approximately 5.6 x 4.4 cm with a 1 cm saccular component posterolaterally on the left; recommend establishing or continuing care with a vascular specialist. For AAA size 5.05.4 cm, CT or MRI in 6 months is recommended per Society for Vascular Surgery; note that the presence of a saccular component warrants vascular specialist management 6. Aortobiiliac artery stent graft faintly visualized 7. 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 rate 18, height 5' 9 (1.753 m), weight 92.5 kg, SpO2 97%. Physical Exam   Assessment/Plan  1 - LEFT Ureteral Stone   - rec urgent OR for cysto, left retrograde, ureteroscopy, laser, stent with goal fo stoen free in functionally dominant kidney. Risks, benefits, alternatives, expected peri-op course discussed.   2 - Acute on Chronic Renal Failure / Atrophic Right Kidney -  renal decompression as per above. AS K and volume status acceptable, I feel OK DC form PACU pending peri-op course. Avoid NSAIDS.     Ricardo KATHEE Alvaro Mickey., MD 09/06/2024, 12:14 PM

## 2024-09-06 NOTE — Anesthesia Procedure Notes (Signed)
 Procedure Name: LMA Insertion Date/Time: 09/06/2024 4:19 PM  Performed by: Para Jerelene CROME, CRNAPre-anesthesia Checklist: Emergency Drugs available, Patient identified, Suction available and Patient being monitored Patient Re-evaluated:Patient Re-evaluated prior to induction Preoxygenation: Pre-oxygenation with 100% oxygen Induction Type: IV induction Ventilation: Mask ventilation without difficulty LMA: LMA inserted LMA Size: 5.0 Number of attempts: 1 Placement Confirmation: positive ETCO2, CO2 detector and breath sounds checked- equal and bilateral ETT to lip (cm): LMA seated properly. Noi markings on Ambu Aurastraight LMA size 5. Tube secured with: Tape Dental Injury: Teeth and Oropharynx as per pre-operative assessment  Comments: Atraumatic insertion of LMA size 5 x 1. Patient edentulous. Lips remain in preoperative condition.

## 2024-09-06 NOTE — Brief Op Note (Signed)
 09/06/2024  4:40 PM  PATIENT:  Clayton Peterson  63 y.o. male  PRE-OPERATIVE DIAGNOSIS:  LEFT URETERAL STONE  POST-OPERATIVE DIAGNOSIS:  LEFT URETERAL STONE  PROCEDURE:  Procedure(s) with comments: CYSTOSCOPY, WITH RETROGRADE PYELOGRAM AND URETERAL STENT INSERTION (Left) - BLADDER STONE REMOVAL  SURGEON:  Surgeons and Role:    * Manny, Ricardo KATHEE Raddle., MD - Primary  PHYSICIAN ASSISTANT:   ASSISTANTS: none   ANESTHESIA:   general  EBL:  minimal   BLOOD ADMINISTERED:none  DRAINS: none   LOCAL MEDICATIONS USED:  NONE  SPECIMEN:  Source of Specimen:  left ureteral stone  DISPOSITION OF SPECIMEN:  given to family  COUNTS:  YES  TOURNIQUET:  * No tourniquets in log *  DICTATION: .Other Dictation: Dictation Number [id number]  PLAN OF CARE: Discharge to home after PACU  PATIENT DISPOSITION:  PACU - hemodynamically stable.   Delay start of Pharmacological VTE agent (>24hrs) due to surgical blood loss or risk of bleeding: yes

## 2024-09-06 NOTE — ED Provider Notes (Signed)
 Dr. Sheree vascular urgent reviewed the patient's CT scan and felt like there was no significant change from the previous film.  Patient does not need any vascular surgery intervention and can follow-up in the office   Suzette Pac, MD 09/06/24 1431

## 2024-09-06 NOTE — Transfer of Care (Signed)
 Immediate Anesthesia Transfer of Care Note  Patient: Clayton Peterson  Procedure(s) Performed: CYSTOSCOPY, WITH RETROGRADE PYELOGRAM AND URETERAL STENT INSERTION (Left: Ureter)  Patient Location: PACU  Anesthesia Type:General  Level of Consciousness: drowsy and patient cooperative  Airway & Oxygen Therapy: Patient Spontanous Breathing and Patient connected to face mask oxygen  Post-op Assessment: Report given to RN and Post -op Vital signs reviewed and stable  Post vital signs: Reviewed and stable  Last Vitals:  Vitals Value Taken Time  BP 146/58 09/06/24 16:50  Temp    Pulse 68 09/06/24 16:52  Resp 20 09/06/24 16:52  SpO2 99 % 09/06/24 16:52  Vitals shown include unfiled device data.  Last Pain:  Vitals:   09/06/24 1312  TempSrc: Oral  PainSc:          Complications: No notable events documented.

## 2024-09-06 NOTE — Anesthesia Postprocedure Evaluation (Signed)
 Anesthesia Post Note  Patient: Clayton Peterson  Procedure(s) Performed: CYSTOSCOPY, WITH RETROGRADE PYELOGRAM AND URETERAL STENT INSERTION (Left: Ureter)     Patient location during evaluation: PACU Anesthesia Type: General Level of consciousness: awake and alert Pain management: pain level controlled Vital Signs Assessment: post-procedure vital signs reviewed and stable Respiratory status: spontaneous breathing, nonlabored ventilation, respiratory function stable and patient connected to nasal cannula oxygen Cardiovascular status: blood pressure returned to baseline and stable Postop Assessment: no apparent nausea or vomiting Anesthetic complications: no   No notable events documented.  Last Vitals:  Vitals:   09/06/24 1700 09/06/24 1715  BP: 120/69   Pulse: 65 68  Resp: 20 19  Temp:    SpO2: 98% 95%    Last Pain:  Vitals:   09/06/24 1715  TempSrc:   PainSc: 0-No pain                 [name]

## 2024-09-06 NOTE — Discharge Instructions (Addendum)
 1 - You may have urinary urgency (bladder spasms) and bloody urine on / off with stent in place. This is normal.  2 - Remove tethered stent on Tuesday morning at home by pulling string, then blue-white plastic tubing, and discarding. Office is open Tuesday if any problems arise.   3 - Call MD or go to ER for fever >102, severe pain / nausea / vomiting not relieved by medications, or acute change in medical status

## 2024-09-07 ENCOUNTER — Encounter (HOSPITAL_COMMUNITY): Payer: Self-pay | Admitting: Urology

## 2024-09-08 LAB — URINE CULTURE: Culture: 20000 — AB

## 2024-09-08 LAB — CBG MONITORING, ED: Glucose-Capillary: 116 mg/dL — ABNORMAL HIGH (ref 70–99)

## 2024-09-08 NOTE — Progress Notes (Unsigned)
 [facility]  Patient name: Clayton Peterson MRN: [id number] DOB: 09/30/1961 Sex: male  REASON FOR CONSULT: Evaluation of recent CT scan and postop abdominal aortic aneurysm repair  HPI: Previously: Clayton Peterson is a 63 y.o. male, who is here today for evaluation.  He is here with his wife.  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  This was in [place] .  His surgeon was Dr. Cathlyn Phlegm to I know well.  He has had episode of pancreatitis proximally 1 year ago and had a CT scan and recently had follow-up within the last month and he is seen today for discussion of these results.  07/17/23: To clinic for evaluation.  He was previously under the care of my partner Dr. Oris.  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  Thankfully he is doing better from the standpoint.  We reviewed his most recent CT scan in detail.  Past Medical History:  Diagnosis Date   AAA (abdominal aortic aneurysm)    Coronary artery disease    Diabetes mellitus without complication (HCC)    Heart attack (HCC)    High cholesterol    Hypertension     Family History  Problem Relation Age of Onset   Atrial fibrillation Mother    Alzheimer's disease Mother    Diabetes Brother    Alzheimer's disease Maternal Grandmother    Aneurysm Maternal Grandfather    Pancreatitis Neg Hx    Pancreatic cancer Neg Hx    Colon cancer Neg Hx     SOCIAL HISTORY: Social History   Socioeconomic History   Marital status: Married    Spouse name: Not on file   Number of children: Not on file   Years of education: Not on file   Highest education level: Associate degree: occupational, scientist, product/process development, or vocational program  Occupational History   Occupation: disabled, former truck driver  Tobacco Use   Smoking status: Every Day    Current packs/day: 1.00    Average  packs/day: 1 pack/day for 48.9 years (48.9 ttl pk-yrs)    Types: Cigarettes    Start date: 1977    Passive exposure: Current   Smokeless tobacco: Never   Tobacco comments:    04/03/2024 Patient smokes a pack daily if bored he smokes more    Smokes 0.5 PPD--03/15/2023.  Vaping Use   Vaping status: Never Used  Substance and Sexual Activity   Alcohol use: Not Currently    Comment: very seldom   Drug use: Never   Sexual activity: Yes  Other Topics Concern   Not on file  Social History Narrative   Not on file   Social Drivers of Health   Financial Resource Strain: Medium Risk (04/21/2024)   Overall Financial Resource Strain (CARDIA)    Difficulty of Paying Living Expenses: Somewhat hard  Food Insecurity: No Food Insecurity (06/20/2024)   Hunger Vital Sign    Worried About Running Out of Food in the Last Year: Never true    Ran Out of Food in the Last Year: Never true  Transportation Needs: No Transportation Needs (06/20/2024)   PRAPARE - Administrator, Civil Service (Medical): No    Lack of Transportation (Non-Medical): No  Physical Activity: Inactive (04/21/2024)   Exercise Vital Sign    Days of Exercise per Week: 0 days    Minutes of Exercise per Session: 0 min  Stress:  No Stress Concern Present (04/21/2024)   [employer] of Occupational Health - Occupational Stress Questionnaire    Feeling of Stress: Not at all  Social Connections: Moderately Isolated (04/21/2024)   Social Connection and Isolation Panel    Frequency of Communication with Friends and Family: More than three times a week    Frequency of Social Gatherings with Friends and Family: More than three times a week    Attends Religious Services: Never    Database Administrator or Organizations: No    Attends Banker Meetings: Never    Marital Status: Married  Catering Manager Violence: Not At Risk (06/20/2024)   Humiliation, Afraid, Rape, and Kick questionnaire    Fear of Current or  Ex-Partner: No    Emotionally Abused: No    Physically Abused: No    Sexually Abused: No    Allergies  Allergen Reactions   Bee Venom Anaphylaxis   Coconut (Cocos Nucifera) Anaphylaxis and Other (See Comments)    coconut allergenic extract   Coconut Oil Anaphylaxis    Current Outpatient Medications  Medication Sig Dispense Refill   acetaminophen  (TYLENOL ) 650 MG CR tablet Take 1,300 mg by mouth every 8 (eight) hours as needed for pain.     aspirin  81 MG EC tablet Take 81 mg by mouth daily.     atorvastatin  (LIPITOR) 80 MG tablet Take 1 tablet (80 mg total) by mouth daily. 90 tablet 3   Blood Pressure Monitoring (BLOOD PRESSURE MONITOR 3) DEVI Check blood pressure once daily 1 hour after taking blood pressure medication 1 each 0   cephALEXin (KEFLEX) 500 MG capsule Take 1 capsule (500 mg total) by mouth 2 (two) times daily. X 3 days to prevent infection with tethered stent 6 capsule 0   Cholecalciferol (VITAMIN D3) 125 MCG (5000 UT) TABS Take 1 tablet by mouth daily.     Continuous Glucose Receiver (FREESTYLE LIBRE 3 READER) DEVI 1 each by Does not apply route daily. 1 each 4   Continuous Glucose Sensor (FREESTYLE LIBRE 3 PLUS SENSOR) MISC USE FOR CONTINUOUS GLUCOSE MONITORING. CHANGE SENSOR EVERY 15 DAYS 2 each 0   dapagliflozin  propanediol (FARXIGA ) 10 MG TABS tablet Take 1 tablet (10 mg total) by mouth daily before breakfast. 90 tablet 3   fenofibrate  54 MG tablet Take 1 tablet (54 mg total) by mouth daily. 90 tablet 3   gabapentin  (NEURONTIN ) 400 MG capsule Take 1 capsule by mouth twice daily 60 capsule 0   glipiZIDE  (GLUCOTROL  XL) 5 MG 24 hr tablet Take 1 tablet by mouth once daily with breakfast 90 tablet 0   insulin  glargine (LANTUS ) 100 UNIT/ML injection Inject 0.3 mLs (30 Units total) into the skin at bedtime. 30 mL 1   lisinopril  (ZESTRIL ) 20 MG tablet Take 1 tablet by mouth once daily 90 tablet 3   metoprolol  succinate (TOPROL -XL) 50 MG 24 hr tablet Take 1 tablet (50 mg total)  by mouth daily. Take with or immediately following a meal. 90 tablet 3   nitroGLYCERIN  (NITROSTAT ) 0.4 MG SL tablet Place 1 tablet (0.4 mg total) under the tongue every 5 (five) minutes as needed for chest pain. 50 tablet 3   Omega-3 Fatty Acids (OMEGA 3 PO) Take 2,000 mg by mouth 2 (two) times daily.     ONETOUCH ULTRA test strip USE 1 STRIP TO CHECK GLUCOSE THREE TIMES DAILY     oxyCODONE -acetaminophen  (PERCOCET) 5-325 MG tablet Take 1 tablet by mouth every 6 (six) hours as needed for  moderate pain (pain score 4-6) or severe pain (pain score 7-10) (post-operatively). 15 tablet 0   pantoprazole  (PROTONIX ) 40 MG tablet Take 1 tablet (40 mg total) by mouth 2 (two) times daily before a meal. 180 tablet 2   RELION PEN NEEDLE 31G/8MM 31G X 8 MM MISC USE 1 PEN NEEDLE TWICE DAILY FOR DIABETES 100 each 0   senna-docusate (SENOKOT-S) 8.6-50 MG tablet Take 1 tablet by mouth 2 (two) times daily. While taking strong pain meds to prevent constipation. 10 tablet 0   No current facility-administered medications for this visit.   Facility-Administered Medications Ordered in Other Visits  Medication Dose Route Frequency Provider Last Rate Last Admin   amisulpride (BARHEMSYS) injection 10 mg  10 mg Intravenous Once PRN Jefm Garnette LABOR, MD       HYDROmorphone  (DILAUDID ) injection 0.25-0.5 mg  0.25-0.5 mg Intravenous Q5 min PRN Jefm Garnette LABOR, MD       ondansetron  (ZOFRAN ) injection 4 mg  4 mg Intravenous Once PRN Jefm Garnette LABOR, MD       oxyCODONE  (Oxy IR/ROXICODONE ) immediate release tablet 5 mg  5 mg Oral Once PRN Jefm Garnette LABOR, MD       Or   oxyCODONE  (ROXICODONE ) 5 MG/5ML solution 5 mg  5 mg Oral Once PRN Jefm Garnette LABOR, MD       PHYSICAL EXAM: There were no vitals filed for this visit.    GENERAL: The patient is a well-nourished male, in no acute distress. The vital signs are documented above. CARDIOVASCULAR: 2+ radial pulse.  2+ femoral pulses.  2+ popliteal pulses without evidence of  aneurysm and 2+ dorsalis pedis pulses bilaterally Midline abdominal incision from xiphoid to pubis well-healed. Midline hernia PULMONARY: There is good air exchange  MUSCULOSKELETAL: There are no major deformities or cyanosis. NEUROLOGIC: No focal weakness or paresthesias are detected. SKIN: There are no ulcers or rashes noted. PSYCHIATRIC: The patient has a normal affect.  DATA:  CT scan was reviewed with the patient and his wife.  This looks overall stable to prior studies.  The pararenal segment is about 45 mm.  There is a small round of circumferential calcium  here and this may be proximal graft as Dr. Oris thought on his last evaluation.  MEDICAL ISSUES: Stable status postrepair of ruptured abdominal aortic aneurysm 2009.  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  Will check a duplex in 1 year to monitor.    Debby SAILOR. Magda, MD [facility] Phone Number: [phone] 09/08/2024 9:51 AM   Note: Portions of this report may have been transcribed using voice recognition software.  Every effort has been made to ensure accuracy; however, inadvertent computerized transcription errors may still be present.

## 2024-09-08 NOTE — Op Note (Unsigned)
 NAME: Clayton Peterson, Clayton Peterson MEDICAL RECORD NO: [id number] ACCOUNT NO: [id number] DATE OF BIRTH: 1961/05/17 FACILITY: [facility] LOCATION: [facility] PHYSICIAN: Ricardo Likens, MD  Operative Report   DATE OF PROCEDURE: 09/06/2024  PREOPERATIVE DIAGNOSES: 1. Left distal ureteral stone. 2. Acute renal failure. 3. Obstruction of dominant kidney.  POSTOPERATIVE DIAGNOSES: 1. Small bladder stone. 2. Left hydronephrosis.  PROCEDURE PERFORMED: 1. Cystoscopy with cystolitholapaxy, stone less than 2 cm. 2. Insertion of left ureteral stent.  COMPLICATIONS: None.  ESTIMATED BLOOD LOSS: Nil.  DRAIN: None.  INDICATIONS: The patient is a 63 year old man without a history of prior urolithiasis. He was found on workup of colicky flank pain to have a very, very, very distal left ureteral stone by CT imaging earlier today at [facility]. He has a  somewhat atrophic right kidney, therefore making his left kidney the dominant one and obstructed with a significant rise in creatinine up to 4; however, normal potassium and normal volume status. There is some question given the incredibly distal  location of the stone whether it was crowning at the UVJ versus intraluminal in the bladder, but given the questionable obstruction in a functionally dominant kidney, I counseled the patient towards operative intervention with ureteroscopy and stenting  with goal to verify free of obstructing stones. He presents for this now. Informed consent was obtained and placed in the medical record.  DESCRIPTION OF PROCEDURE: The patient being verified, procedure being left ureteroscopic stone manipulation was confirmed.  A procedure timeout was performed. Intravenous antibiotics administered and verified. General LMA anesthesia was induced. The  patient was placed into a low lithotomy position. A sterile field was created, prepping and draping the patient's penis, perineum and proximal thigh using iodine, and cystourethroscopy  was performed using 21-French rigid cystoscope with offset lens.  Inspection of anterior and posterior urethra were unremarkable. Inspection of the urinary bladder revealed no diverticula or papillary lesions. The ureteral orifices were single. There was, in fact, a small calcification consistent with a prior ureteral  stone that was in the dependent portion of the bladder. This was irrigated and set aside for composition analysis. The left ureteral orifice was quite edematous, consistent with recent stone impaction; however, there was no visible stone at this  location, likely corroborating that in fact the small bladder stone was his distal ureteral stone previously. This was fortunate. Given the significant edema at the ureteral orifice and his acute renal failure, a left retrograde pyelogram was obtained.  The left retrograde pyelogram demonstrated single left ureter, single system left kidney. No filling defects or narrowing were noted. There was still some moderate hydronephrosis noted. A 0.038 ZIPwire was advanced to the level of the upper pole, over  which a new 5 x 26 Polaris type stent was carefully placed. Good proximal and distal plane were noted. The bladder was emptied per cystoscope, tether was left in place and fashioned to the dorsum of the penis. Procedure was terminated. The patient  tolerated the procedure well. No immediate periprocedural complications. The patient taken to postanesthesia care unit in stable condition.  Plan for discharge home. I recommended to keep the stent in for several days, again to allow decompression of his  functionally dominant kidney.    SHW D: 09/06/2024 4:45:26 pm T: 09/06/2024 8:56:00 pm  JOB: [id number]/ [id number]

## 2024-09-09 ENCOUNTER — Ambulatory Visit (INDEPENDENT_AMBULATORY_CARE_PROVIDER_SITE_OTHER)

## 2024-09-09 ENCOUNTER — Encounter: Payer: Self-pay | Admitting: Vascular Surgery

## 2024-09-09 ENCOUNTER — Ambulatory Visit: Admitting: Vascular Surgery

## 2024-09-09 VITALS — BP 114/59 | HR 46 | Ht 69.0 in | Wt 204.0 lb

## 2024-09-09 DIAGNOSIS — I714 Abdominal aortic aneurysm, without rupture, unspecified: Secondary | ICD-10-CM | POA: Diagnosis not present

## 2024-09-09 DIAGNOSIS — Z95828 Presence of other vascular implants and grafts: Secondary | ICD-10-CM

## 2024-09-09 DIAGNOSIS — Z8679 Personal history of other diseases of the circulatory system: Secondary | ICD-10-CM

## 2024-09-26 ENCOUNTER — Other Ambulatory Visit: Payer: Self-pay | Admitting: Family Medicine

## 2024-09-27 ENCOUNTER — Other Ambulatory Visit: Payer: Self-pay | Admitting: Family Medicine

## 2024-10-03 ENCOUNTER — Other Ambulatory Visit: Payer: Self-pay | Admitting: Family Medicine

## 2024-10-03 DIAGNOSIS — E119 Type 2 diabetes mellitus without complications: Secondary | ICD-10-CM

## 2024-10-06 ENCOUNTER — Other Ambulatory Visit: Payer: Self-pay | Admitting: *Deleted

## 2024-10-06 NOTE — Patient Instructions (Signed)
 Visit Information  Thank you for taking time to visit with me today. Please don't hesitate to contact me if I can be of assistance to you before our next scheduled appointment.  Your next care management appointment is by telephone on 11-06-2024 at 9:30 am  Telephone follow-up in 1 month  Please call the care guide team at [phone] if you need to cancel, schedule, or reschedule an appointment.   Please call the Suicide and Crisis Lifeline: 988 call the [place]  National Suicide Prevention Lifeline: 813-696-5169 or TTY: 301-867-3940 TTY (864)333-7647) to talk to a trained counselor call 1-800-273-TALK (toll free, 24 hour hotline) if you are experiencing a Mental Health or Behavioral Health Crisis or need someone to talk to.  Rosina Forte, BSN RN [facility], [facility] RN Care Manager Direct Dial: [phone]  Fax: [phone]

## 2024-10-06 NOTE — Patient Outreach (Signed)
 Complex Care Management   Visit Note  10/06/2024  Name:  Clayton Peterson MRN: [id number] DOB: 01-23-1961  Situation: Referral received for Complex Care Management related to Diabetes with Complications and HLD I obtained verbal consent from Patient.  Visit completed with Patient  on the phone  Background:   Past Medical History:  Diagnosis Date   AAA (abdominal aortic aneurysm)    Coronary artery disease    Diabetes mellitus without complication (HCC)    Heart attack (HCC)    High cholesterol    Hypertension     Assessment: Patient Reported Symptoms:  Cognitive Cognitive Status: No symptoms reported Cognitive/Intellectual Conditions Management [RPT]: None reported or documented in medical history or problem list   Health Maintenance Behaviors: Annual physical exam Healing Pattern: Average Health Facilitated by: Rest  Neurological Neurological Review of Symptoms: No symptoms reported    HEENT HEENT Symptoms Reported: Tinnitus HEENT Management Strategies: Routine screening HEENT Self-Management Outcome: 3 (uncertain)    Cardiovascular Cardiovascular Symptoms Reported: No symptoms reported Does patient have uncontrolled Hypertension?: No Cardiovascular Management Strategies: Routine screening Cardiovascular Self-Management Outcome: 4 (good)  Respiratory Respiratory Symptoms Reported: No symptoms reported Respiratory Self-Management Outcome: 4 (good)  Endocrine Endocrine Symptoms Reported: No symptoms reported Is patient diabetic?: Yes Is patient checking blood sugars at home?: Yes List most recent blood sugar readings, include date and time of day: 10-06-2024 at 0700 168 Endocrine Self-Management Outcome: 3 (uncertain)  Gastrointestinal Gastrointestinal Symptoms Reported: No symptoms reported Gastrointestinal Management Strategies: Coping strategies Gastrointestinal Self-Management Outcome: 4 (good)    Genitourinary Genitourinary Symptoms Reported: No symptoms  reported    Integumentary Integumentary Symptoms Reported: No symptoms reported    Musculoskeletal Musculoskelatal Symptoms Reviewed: Back pain Musculoskeletal Management Strategies: Coping strategies Musculoskeletal Self-Management Outcome: 4 (good) Falls in the past year?: No Number of falls in past year: 1 or less Was there an injury with Fall?: No Fall Risk Category Calculator: 0 Patient Fall Risk Level: Low Fall Risk Patient at Risk for Falls Due to: No Fall Risks Fall risk Follow up: Falls evaluation completed  Psychosocial Psychosocial Symptoms Reported: No symptoms reported Behavioral Management Strategies: Coping strategies Behavioral Health Self-Management Outcome: 4 (good) Major Change/Loss/Stressor/Fears (CP): Denies Techniques to Cope with Loss/Stress/Change: Not applicable      10/06/2024    PHQ2-9 Depression Screening   Little interest or pleasure in doing things Not at all  Feeling down, depressed, or hopeless Not at all  PHQ-2 - Total Score 0  Trouble falling or staying asleep, or sleeping too much    Feeling tired or having little energy    Poor appetite or overeating     Feeling bad about yourself - or that you are a failure or have let yourself or your family down    Trouble concentrating on things, such as reading the newspaper or watching television    Moving or speaking so slowly that other people could have noticed.  Or the opposite - being so fidgety or restless that you have been moving around a lot more than usual    Thoughts that you would be better off dead, or hurting yourself in some way    PHQ2-9 Total Score    If you checked off any problems, how difficult have these problems made it for you to do your work, take care of things at home, or get along with other people    Depression Interventions/Treatment      Today's Vitals   10/06/24 0944  BP: 124/70  Pain Score: 0-No pain  Medications Reviewed Today     Reviewed by Bertrum Rosina HERO,  RN (Registered Nurse) on 10/06/24 at [phone]  Med List Status: <None>   Medication Order Taking? Sig Documenting Provider Last Dose Status Informant  acetaminophen  (TYLENOL ) 650 MG CR tablet [id number] Yes Take 1,300 mg by mouth every 8 (eight) hours as needed for pain. [provider]  Active Spouse/Significant Other  aspirin  81 MG EC tablet [id number] Yes Take 81 mg by mouth daily. [provider]  Active Spouse/Significant Other  atorvastatin  (LIPITOR) 80 MG tablet [id number] Yes Take 1 tablet (80 mg total) by mouth daily. Darryle Thom CROME, PA-C  Active   Blood Pressure Monitoring (BLOOD PRESSURE MONITOR 3) DEVI [id number] Yes Check blood pressure once daily 1 hour after taking blood pressure medication Tobie Suzzane POUR, MD  Active   cephALEXin  (KEFLEX ) 500 MG capsule [id number]  Take 1 capsule (500 mg total) by mouth 2 (two) times daily. X 3 days to prevent infection with tethered stent  Patient not taking: Reported on 10/06/2024   Alvaro Ricardo KATHEE Mickey., MD  Consider Medication Status and Discontinue   Cholecalciferol (VITAMIN D3) 125 MCG (5000 UT) TABS [id number] Yes Take 1 tablet by mouth daily. [provider]  Active Spouse/Significant Other  Continuous Glucose Receiver (FREESTYLE LIBRE 3 READER) DEVI [id number] Yes 1 each by Does not apply route daily. Del Wilhelmena Falter, Hilario, FNP  Active   Continuous Glucose Sensor (FREESTYLE LIBRE 3 PLUS SENSOR) [place] [id number] Yes USE FOR CONTINUOUS GLUCOSE MONITORING. CHANGE SENSOR EVERY 15 DAYS Del Wilhelmena Falter, [place], [place]  Active   dapagliflozin  propanediol (FARXIGA ) 10 MG TABS tablet [id number] Yes Take 1 tablet (10 mg total) by mouth daily before breakfast. Darryle Thom CROME, PA-C  Active   fenofibrate  54 MG tablet [id number] Yes Take 1 tablet (54 mg total) by mouth daily. Mona Vinie BROCKS, MD  Active   gabapentin  (NEURONTIN ) 400 MG capsule [id number] Yes Take 1 capsule by mouth twice daily Del Orbe Polanco, [place], FNP  Active   glipiZIDE   (GLUCOTROL  XL) 5 MG 24 hr tablet [id number] Yes Take 1 tablet by mouth once daily with breakfast Bevely Doffing, FNP  Active   insulin  glargine (LANTUS ) 100 UNIT/ML injection [id number] Yes Inject 0.3 mLs (30 Units total) into the skin at bedtime. Nida, Gebreselassie W, MD  Active   lisinopril  (ZESTRIL ) 20 MG tablet [id number] Yes Take 1 tablet by mouth once daily Hilty, Vinie BROCKS, MD  Active   metoprolol  succinate (TOPROL -XL) 50 MG 24 hr tablet [id number] Yes Take 1 tablet (50 mg total) by mouth daily. Take with or immediately following a meal. Darryle Thom CROME, PA-C  Active   nitroGLYCERIN  (NITROSTAT ) 0.4 MG SL tablet [id number] Yes Place 1 tablet (0.4 mg total) under the tongue every 5 (five) minutes as needed for chest pain. Del Orbe Polanco, Hilario, FNP  Active Spouse/Significant Other  Omega-3 Fatty Acids (OMEGA 3 PO) [id number] Yes Take 2,000 mg by mouth 2 (two) times daily. [provider]  Active Spouse/Significant Other  ONETOUCH ULTRA test strip [id number] Yes USE 1 STRIP TO CHECK GLUCOSE THREE TIMES DAILY [provider]  Active   oxyCODONE -acetaminophen  (PERCOCET) 5-325 MG tablet [id number]  Take 1 tablet by mouth every 6 (six) hours as needed for moderate pain (pain score 4-6) or severe pain (pain score 7-10) (post-operatively).  Patient not taking: Reported on 10/06/2024   Alvaro Ricardo KATHEE Mickey., MD  Active   pantoprazole  (PROTONIX ) 40 MG tablet [id number]  Yes Take 1 tablet (40 mg total) by mouth 2 (two) times daily before a meal. Bevely Doffing, FNP  Active   RELION PEN NEEDLE 31G/8MM 31G X 8 MM MISC [id number] Yes USE 1 PEN NEEDLE TWICE DAILY FOR DIABETES Del Wilhelmena Falter, Hilario, FNP  Active   senna-docusate (SENOKOT-S) 8.6-50 MG tablet [id number]  Take 1 tablet by mouth 2 (two) times daily. While taking strong pain meds to prevent constipation.  Patient not taking: Reported on 10/06/2024   Alvaro Ricardo KATHEE Mickey., MD  Active             Recommendation:   Continue Current  Plan of Care  Follow Up Plan:   Telephone follow-up in 1 month  Rosina Forte, BSN RN [facility], [facility] RN Care Manager Direct Dial: [phone]  Fax: [phone]

## 2024-10-22 ENCOUNTER — Other Ambulatory Visit (HOSPITAL_BASED_OUTPATIENT_CLINIC_OR_DEPARTMENT_OTHER): Payer: Self-pay

## 2024-10-26 ENCOUNTER — Other Ambulatory Visit: Payer: Self-pay | Admitting: Family Medicine

## 2024-10-28 DIAGNOSIS — E119 Type 2 diabetes mellitus without complications: Secondary | ICD-10-CM

## 2024-11-06 ENCOUNTER — Other Ambulatory Visit: Payer: Self-pay | Admitting: *Deleted

## 2024-11-06 NOTE — Patient Outreach (Signed)
 Complex Care Management   Visit Note  11/06/2024  Name:  Clayton Peterson MRN: [id number] DOB: 1960-11-08  Situation: Referral received for Complex Care Management related to Diabetes with Complications and HLD I obtained verbal consent from Patient.  Visit completed with Patient  on the phone  Background:   Past Medical History:  Diagnosis Date   AAA (abdominal aortic aneurysm)    Coronary artery disease    Diabetes mellitus without complication (HCC)    Heart attack (HCC)    High cholesterol    Hypertension     Assessment: Patient Reported Symptoms:  Cognitive Cognitive Status: No symptoms reported Cognitive/Intellectual Conditions Management [RPT]: None reported or documented in medical history or problem list   Health Maintenance Behaviors: Annual physical exam Healing Pattern: Average Health Facilitated by: Rest  Neurological Neurological Review of Symptoms: Vision changes Neurological Management Strategies: Routine screening Neurological Self-Management Outcome: 3 (uncertain) Neurological Comment: new glasses  - scheduled for cataract  HEENT HEENT Symptoms Reported: Tinnitus HEENT Management Strategies: Routine screening HEENT Self-Management Outcome: 4 (good)    Cardiovascular Cardiovascular Symptoms Reported: No symptoms reported Does patient have uncontrolled Hypertension?: No Cardiovascular Management Strategies: Routine screening Cardiovascular Self-Management Outcome: 4 (good)  Respiratory Respiratory Symptoms Reported: No symptoms reported Respiratory Management Strategies: Routine screening Respiratory Self-Management Outcome: 5 (very good)  Endocrine Endocrine Symptoms Reported: No symptoms reported Is patient diabetic?: Yes Is patient checking blood sugars at home?: Yes List most recent blood sugar readings, include date and time of day: 11-06-2024 at 0530 106 Endocrine Self-Management Outcome: 4 (good)  Gastrointestinal Gastrointestinal Symptoms  Reported: No symptoms reported Gastrointestinal Self-Management Outcome: 4 (good)    Genitourinary Genitourinary Symptoms Reported: No symptoms reported Genitourinary Self-Management Outcome: 4 (good)  Integumentary Integumentary Symptoms Reported: No symptoms reported Skin Management Strategies: Routine screening Skin Self-Management Outcome: 4 (good)  Musculoskeletal Musculoskelatal Symptoms Reviewed: No symptoms reported, Back pain Musculoskeletal Management Strategies: Coping strategies, Routine screening, Medication therapy Musculoskeletal Self-Management Outcome: 4 (good) Falls in the past year?: No Number of falls in past year: 1 or less Was there an injury with Fall?: No Fall Risk Category Calculator: 0 Patient Fall Risk Level: Low Fall Risk Patient at Risk for Falls Due to: No Fall Risks Fall risk Follow up: Falls evaluation completed  Psychosocial Psychosocial Symptoms Reported: No symptoms reported Behavioral Management Strategies: Coping strategies Behavioral Health Self-Management Outcome: 4 (good) Major Change/Loss/Stressor/Fears (CP): Denies Techniques to Cope with Loss/Stress/Change: Diversional activities      11/06/2024    PHQ2-9 Depression Screening   Little interest or pleasure in doing things Not at all  Feeling down, depressed, or hopeless Not at all  PHQ-2 - Total Score 0  Trouble falling or staying asleep, or sleeping too much    Feeling tired or having little energy    Poor appetite or overeating     Feeling bad about yourself - or that you are a failure or have let yourself or your family down    Trouble concentrating on things, such as reading the newspaper or watching television    Moving or speaking so slowly that other people could have noticed.  Or the opposite - being so fidgety or restless that you have been moving around a lot more than usual    Thoughts that you would be better off dead, or hurting yourself in some way    PHQ2-9 Total Score     If you checked off any problems, how difficult have these problems made it for you to  do your work, take care of things at home, or get along with other people    Depression Interventions/Treatment      There were no vitals filed for this visit. Pain Score: 0-No pain  Medications Reviewed Today     Reviewed by Bertrum Rosina HERO, RN (Registered Nurse) on 11/06/24 at [phone]  Med List Status: <None>   Medication Order Taking? Sig Documenting Provider Last Dose Status Informant  acetaminophen  (TYLENOL ) 650 MG CR tablet [id number] Yes Take 1,300 mg by mouth every 8 (eight) hours as needed for pain. [provider]  Active Spouse/Significant Other  aspirin  81 MG EC tablet [id number] Yes Take 81 mg by mouth daily. [provider]  Active Spouse/Significant Other  atorvastatin  (LIPITOR) 80 MG tablet [id number] Yes Take 1 tablet (80 mg total) by mouth daily. Darryle Thom CROME, PA-C  Active   Blood Pressure Monitoring (BLOOD PRESSURE MONITOR 3) DEVI [id number] Yes Check blood pressure once daily 1 hour after taking blood pressure medication Tobie Suzzane POUR, MD  Active   cephALEXin  (KEFLEX ) 500 MG capsule [id number]  Take 1 capsule (500 mg total) by mouth 2 (two) times daily. X 3 days to prevent infection with tethered stent  Patient not taking: Reported on 11/06/2024   Alvaro Ricardo KATHEE Mickey., MD  Consider Medication Status and Discontinue   Cholecalciferol (VITAMIN D3) 125 MCG (5000 UT) TABS [id number] Yes Take 1 tablet by mouth daily. [provider]  Active Spouse/Significant Other  Continuous Glucose Receiver (FREESTYLE LIBRE 3 READER) DEVI [id number] Yes 1 each by Does not apply route daily. Del Wilhelmena Falter, Hilario, FNP  Active   Continuous Glucose Sensor (FREESTYLE LIBRE 3 PLUS SENSOR) [place] [id number] Yes USE FOR CONTINUOUS GLUCOSE MONITORING. CHANGE SENSOR EVERY 15 DAYS Del Wilhelmena Falter, [place], [place]  Active   dapagliflozin  propanediol (FARXIGA ) 10 MG TABS tablet [id number] Yes Take 1  tablet (10 mg total) by mouth daily before breakfast. Darryle Thom CROME, PA-C  Active   fenofibrate  54 MG tablet [id number] Yes Take 1 tablet (54 mg total) by mouth daily. Mona Vinie BROCKS, MD  Active   gabapentin  (NEURONTIN ) 400 MG capsule [id number] Yes Take 1 capsule by mouth twice daily Del Orbe Polanco, Iliana, FNP  Active   glipiZIDE  (GLUCOTROL  XL) 5 MG 24 hr tablet [id number] Yes Take 1 tablet by mouth once daily with breakfast Bevely Doffing, FNP  Active   insulin  glargine (LANTUS ) 100 UNIT/ML injection [id number] Yes Inject 0.3 mLs (30 Units total) into the skin at bedtime. Nida, Gebreselassie W, MD  Active   lisinopril  (ZESTRIL ) 20 MG tablet [id number] Yes Take 1 tablet by mouth once daily Hilty, Vinie BROCKS, MD  Active   metoprolol  succinate (TOPROL -XL) 50 MG 24 hr tablet [id number] Yes Take 1 tablet (50 mg total) by mouth daily. Take with or immediately following a meal. Darryle Thom CROME, PA-C  Active   nitroGLYCERIN  (NITROSTAT ) 0.4 MG SL tablet [id number] Yes Place 1 tablet (0.4 mg total) under the tongue every 5 (five) minutes as needed for chest pain. Del Orbe Polanco, Hilario, FNP  Active Spouse/Significant Other  Omega-3 Fatty Acids (OMEGA 3 PO) [id number] Yes Take 2,000 mg by mouth 2 (two) times daily. [provider]  Active Spouse/Significant Other  ONETOUCH ULTRA test strip [id number] Yes USE 1 STRIP TO CHECK GLUCOSE THREE TIMES DAILY [provider]  Active   oxyCODONE -acetaminophen  (PERCOCET) 5-325 MG tablet [id number]  Take 1 tablet by mouth every 6 (six) hours as needed for moderate pain (pain score  4-6) or severe pain (pain score 7-10) (post-operatively).  Patient not taking: Reported on 11/06/2024   Alvaro Ricardo KATHEE Mickey., MD  Consider Medication Status and Discontinue   pantoprazole  (PROTONIX ) 40 MG tablet [id number] Yes Take 1 tablet (40 mg total) by mouth 2 (two) times daily before a meal. Bevely Doffing, FNP  Active   RELION PEN NEEDLE 31G/8MM 31G X 8 MM MISC [id number] Yes  USE 1 PEN NEEDLE TWICE DAILY FOR DIABETES Del Wilhelmena Falter, Hilario, FNP  Active   senna-docusate (SENOKOT-S) 8.6-50 MG tablet [id number]  Take 1 tablet by mouth 2 (two) times daily. While taking strong pain meds to prevent constipation.  Patient not taking: Reported on 11/06/2024   Alvaro Ricardo KATHEE Mickey., MD  Active             Recommendation:   Continue Current Plan of Care  Follow Up Plan:   Telephone follow-up in 1 month  Rosina Forte, BSN RN [facility], [facility] RN Care Manager Direct Dial: [phone]  Fax: [phone]

## 2024-11-06 NOTE — Patient Instructions (Signed)
 Visit Information  Thank you for taking time to visit with me today. Please don't hesitate to contact me if I can be of assistance to you before our next scheduled appointment.  Your next care management appointment is by telephone on 12-08-2024 at 9:30 am  Telephone follow-up in 1 month  Please call the care guide team at [phone] if you need to cancel, schedule, or reschedule an appointment.   Please call the Suicide and Crisis Lifeline: 988 call the [place]  National Suicide Prevention Lifeline: 820-036-9581 or TTY: (606)846-8791 TTY 5163394544) to talk to a trained counselor call 1-800-273-TALK (toll free, 24 hour hotline) if you are experiencing a Mental Health or Behavioral Health Crisis or need someone to talk to.  Rosina Forte, BSN RN [facility], [facility] RN Care Manager Direct Dial: [phone]  Fax: [phone]

## 2024-11-13 ENCOUNTER — Encounter (HOSPITAL_COMMUNITY)
Admission: RE | Admit: 2024-11-13 | Discharge: 2024-11-13 | Disposition: A | Discharge status: Home / Self Care | Source: Ambulatory Visit | Attending: Ophthalmology | Admitting: Ophthalmology

## 2024-11-13 ENCOUNTER — Ambulatory Visit: Admitting: Internal Medicine

## 2024-11-22 ENCOUNTER — Other Ambulatory Visit: Payer: Self-pay | Admitting: Family Medicine

## 2024-11-24 ENCOUNTER — Encounter (HOSPITAL_COMMUNITY)

## 2024-11-26 ENCOUNTER — Encounter (HOSPITAL_COMMUNITY): Admission: RE | Admit: 2024-11-26 | Discharge: 2024-11-26 | Attending: Ophthalmology

## 2024-11-26 ENCOUNTER — Other Ambulatory Visit: Payer: Self-pay

## 2024-11-26 ENCOUNTER — Encounter (HOSPITAL_COMMUNITY): Payer: Self-pay

## 2024-11-26 HISTORY — DX: Fibromyalgia: M79.7

## 2024-11-27 NOTE — H&P (Signed)
 Surgical History & Physical  Patient Name: Clayton Peterson  DOB: 09/29/1961  Surgery: Cataract extraction with intraocular lens implant phacoemulsification; Left Eye Surgeon: Lynwood Hermann MD Surgery Date: 12/01/2024 Pre-Op Date: 10/10/2025  HPI: A 24 Yr. old male patient 1.  The patient is here for a Cataract Evaluation. The patient complains of difficulty when reading fine print, books, newspaper, instructions etc., which began 1 year ago. Both eyes are affected. The episode is gradual. The patient describes foggy and hazy symptoms affecting their eyes/vision. This is negatively affecting the patient's quality of life and the patient is unable to function adequately in life with the current level of vision. HPI Completed by Dr. Lynwood Hermann  Medical History:  Arthritis Diabetes Heart Problem High Blood Pressure LDL  Review of Systems Cardiovascular High Blood Pressure, Stent Endocrine DMII Respiratory Shortness of Breath All recorded systems are negative except as noted above.  Social Never Smoked  Medication Prednisolone-moxiflox-bromfen,  Atorvastatin , Lisinopril , Metoprolol  succinate, Gabapentin , Cephalexin , Pantoprazole , Glipizide , Oxycodone -acetaminophen , Lantus  Solostar U-100 Insulin , Fenofibrate , Farxiga , Stimulant Laxative   Sx/Procedures Kidney Stone Removal, Aneurysm Repair- Stent, Knee Sx, Intestines Detangled, Back Sx  Drug Allergies  NKDA  History & Physical: Heent: cataracts NECK: supple without bruits LUNGS: lungs clear to auscultation CV: regular rate and rhythm Abdomen: soft and non-tender  Impression & Plan: Assessment: 1.  NUCLEAR SCLEROSIS AGE RELATED; Both Eyes (H25.13) 2.  BLEPHARITIS; Right Upper Lid, Right Lower Lid, Left Upper Lid, Left Lower Lid (H01.001, H01.002,H01.004,H01.005) 3.  DERMATOCHALASIS, no surgery; Right Upper Lid, Left Upper Lid (H02.831, H02.834) 4.  Pinguecula; Both Eyes (H11.153) 5.  ASTIGMATISM, REGULAR; Both Eyes  (H52.223) 6.  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  Cataract accounts for the patient's decreased vision. This visual impairment is not correctable with a tolerable change in glasses or contact lenses. Cataract surgery with an implantation of a new lens should significantly improve the visual and functional status of the patient. Recommend phacoemulsification with intraocular lens. Discussed all risks, benefits, alternatives, and potential complications. Discussed the procedures and recovery. The patient desires to have surgery. A-scan/Biometry ordered and will be performed for intraocular lens calculations. The surgery will be performed in order to improve vision for driving, reading, and for eye examinations. Recommend Dextenza  for post-operative pain and inflammation. Educational materials provided: Cataract. History of corneal refractive Surgery: None History of Previous Ocular Surgery (PPV, other): None History of ocular trauma: None Use of Eye Pressure Lowering Drops: None No current contact lens use. Pupil Status: Dilates poorly - shugarcaine or Lidocaine +Omidira by protocol, Malyugin Ring Left Eye worse. OS first, then OD. Recommend Toric Lens OU Panoptix Pro Toric Lens OU  2.  Blepharitis is present - recommend regular lid cleaning.  3.  Asymptomatic, recommend observation for now. Findings, prognosis and treatment options reviewed.  4.  Observe; Artificial tears as needed for irritation.  5.  Explained astigmatism to patient. Recommend Toric Lens OU  6.  No evidence of diabetic retinopathy or diabetic macular edema on exam today. Recommend follow up with PCP for blood glucose/blood pressure/cholesterol control. Recommend annual dilated eye exam.

## 2024-11-28 ENCOUNTER — Other Ambulatory Visit: Payer: Self-pay | Admitting: Family Medicine

## 2024-11-28 DIAGNOSIS — E119 Type 2 diabetes mellitus without complications: Secondary | ICD-10-CM

## 2024-12-01 ENCOUNTER — Ambulatory Visit (HOSPITAL_COMMUNITY): Admission: RE | Admit: 2024-12-01 | Source: Home / Self Care | Admitting: Ophthalmology

## 2024-12-08 ENCOUNTER — Encounter (HOSPITAL_COMMUNITY)

## 2024-12-08 ENCOUNTER — Telehealth: Admitting: *Deleted

## 2024-12-15 ENCOUNTER — Ambulatory Visit (HOSPITAL_COMMUNITY): Admit: 2024-12-15 | Admitting: Ophthalmology

## 2024-12-15 ENCOUNTER — Encounter (HOSPITAL_COMMUNITY): Admission: RE | Payer: Self-pay | Source: Home / Self Care

## 2025-01-19 ENCOUNTER — Ambulatory Visit: Admitting: Internal Medicine

## 2025-04-22 ENCOUNTER — Ambulatory Visit
